# Patient Record
Sex: Male | Born: 1970 | Race: White | Hispanic: No | Smoking: Current every day smoker
Health system: Southern US, Community
[De-identification: ages and names within clinical notes are randomized; demographics above are authoritative.]

## PROBLEM LIST (undated history)

## (undated) DIAGNOSIS — F1910 Other psychoactive substance abuse, uncomplicated: Secondary | ICD-10-CM

## (undated) DIAGNOSIS — F419 Anxiety disorder, unspecified: Secondary | ICD-10-CM

## (undated) DIAGNOSIS — Z8673 Personal history of transient ischemic attack (TIA), and cerebral infarction without residual deficits: Secondary | ICD-10-CM

## (undated) DIAGNOSIS — I639 Cerebral infarction, unspecified: Secondary | ICD-10-CM

## (undated) DIAGNOSIS — F329 Major depressive disorder, single episode, unspecified: Secondary | ICD-10-CM

## (undated) DIAGNOSIS — K219 Gastro-esophageal reflux disease without esophagitis: Secondary | ICD-10-CM

## (undated) DIAGNOSIS — M25569 Pain in unspecified knee: Secondary | ICD-10-CM

## (undated) DIAGNOSIS — F908 Attention-deficit hyperactivity disorder, other type: Secondary | ICD-10-CM

## (undated) DIAGNOSIS — F319 Bipolar disorder, unspecified: Secondary | ICD-10-CM

## (undated) DIAGNOSIS — F32A Depression, unspecified: Secondary | ICD-10-CM

## (undated) HISTORY — PX: AORTA SURGERY: SHX548

## (undated) HISTORY — DX: Gastro-esophageal reflux disease without esophagitis: K21.9

## (undated) HISTORY — DX: Pain in unspecified knee: M25.569

## (undated) HISTORY — PX: TONSILLECTOMY: SUR1361

## (undated) HISTORY — DX: Cerebral infarction, unspecified: I63.9

---
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
## 2006-12-31 ENCOUNTER — Emergency Department (HOSPITAL_COMMUNITY): Admission: EM | Admit: 2006-12-31 | Discharge: 2006-12-31 | Payer: Self-pay | Admitting: Emergency Medicine

## 2009-03-11 ENCOUNTER — Encounter: Admission: RE | Admit: 2009-03-11 | Discharge: 2009-03-11 | Payer: Self-pay | Admitting: Family Medicine

## 2009-06-30 ENCOUNTER — Emergency Department (HOSPITAL_BASED_OUTPATIENT_CLINIC_OR_DEPARTMENT_OTHER): Admission: EM | Admit: 2009-06-30 | Discharge: 2009-06-30 | Payer: Self-pay | Admitting: Emergency Medicine

## 2009-07-02 ENCOUNTER — Emergency Department (HOSPITAL_BASED_OUTPATIENT_CLINIC_OR_DEPARTMENT_OTHER): Admission: EM | Admit: 2009-07-02 | Discharge: 2009-07-02 | Payer: Self-pay | Admitting: Emergency Medicine

## 2010-08-04 ENCOUNTER — Inpatient Hospital Stay (HOSPITAL_COMMUNITY): Admission: EM | Admit: 2010-08-04 | Discharge: 2010-08-12 | Payer: Self-pay | Admitting: Psychiatry

## 2010-08-04 ENCOUNTER — Emergency Department (HOSPITAL_COMMUNITY): Admission: EM | Admit: 2010-08-04 | Discharge: 2010-08-04 | Payer: Self-pay | Admitting: Emergency Medicine

## 2010-08-04 ENCOUNTER — Ambulatory Visit: Payer: Self-pay | Admitting: Psychiatry

## 2010-10-12 ENCOUNTER — Encounter: Payer: Self-pay | Admitting: Family Medicine

## 2010-12-01 LAB — URINALYSIS, ROUTINE W REFLEX MICROSCOPIC
Bilirubin Urine: NEGATIVE
Glucose, UA: NEGATIVE mg/dL
Ketones, ur: NEGATIVE mg/dL
Specific Gravity, Urine: <1.005 — ABNORMAL LOW (ref 1.005–1.030)
pH: 6 (ref 5.0–8.0)

## 2010-12-01 LAB — DIFFERENTIAL
Basophils Relative: 1 % (ref 0–1)
Lymphs Abs: 1.5 10*3/uL (ref 0.7–4.0)
Monocytes Absolute: 0.6 10*3/uL (ref 0.1–1.0)
Monocytes Relative: 7 % (ref 3–12)
Neutro Abs: 6.3 10*3/uL (ref 1.7–7.7)
Neutrophils Relative %: 72 % (ref 43–77)

## 2010-12-01 LAB — BASIC METABOLIC PANEL
BUN: 6 mg/dL (ref 6–23)
Chloride: 106 mEq/L (ref 96–112)
Glucose, Bld: 100 mg/dL — ABNORMAL HIGH (ref 70–99)
Potassium: 3.9 mEq/L (ref 3.5–5.1)
Sodium: 139 mEq/L (ref 135–145)

## 2010-12-01 LAB — HEPATIC FUNCTION PANEL
ALT: 27 U/L (ref 0–53)
Albumin: 3.6 g/dL (ref 3.5–5.2)
Alkaline Phosphatase: 89 U/L (ref 39–117)
Total Bilirubin: 0.4 mg/dL (ref 0.3–1.2)
Total Protein: 6.3 g/dL (ref 6.0–8.3)

## 2010-12-01 LAB — CBC
HCT: 44.7 % (ref 39.0–52.0)
Hemoglobin: 15 g/dL (ref 13.0–17.0)
MCH: 33.7 pg (ref 26.0–34.0)
MCHC: 33.5 g/dL (ref 30.0–36.0)
RBC: 4.45 MIL/uL (ref 4.22–5.81)

## 2010-12-01 LAB — RAPID URINE DRUG SCREEN, HOSP PERFORMED
Benzodiazepines: NOT DETECTED
Cocaine: NOT DETECTED
Opiates: NOT DETECTED

## 2010-12-01 LAB — LITHIUM LEVEL: Lithium Lvl: 0.41 mEq/L — ABNORMAL LOW (ref 0.80–1.40)

## 2012-03-13 ENCOUNTER — Ambulatory Visit (INDEPENDENT_AMBULATORY_CARE_PROVIDER_SITE_OTHER): Payer: BC Managed Care – PPO | Admitting: Psychology

## 2012-03-13 DIAGNOSIS — F908 Attention-deficit hyperactivity disorder, other type: Secondary | ICD-10-CM

## 2012-03-13 DIAGNOSIS — F909 Attention-deficit hyperactivity disorder, unspecified type: Secondary | ICD-10-CM

## 2012-04-10 ENCOUNTER — Encounter (HOSPITAL_COMMUNITY): Payer: Self-pay | Admitting: Psychology

## 2012-04-10 ENCOUNTER — Ambulatory Visit (INDEPENDENT_AMBULATORY_CARE_PROVIDER_SITE_OTHER): Payer: BC Managed Care – PPO | Admitting: Psychology

## 2012-04-10 DIAGNOSIS — F1911 Other psychoactive substance abuse, in remission: Secondary | ICD-10-CM

## 2012-04-10 DIAGNOSIS — F908 Attention-deficit hyperactivity disorder, other type: Secondary | ICD-10-CM

## 2012-04-10 DIAGNOSIS — F909 Attention-deficit hyperactivity disorder, unspecified type: Secondary | ICD-10-CM

## 2012-04-10 NOTE — Progress Notes (Signed)
Patient:   Daniel Arroyo   DOB:   1971-03-01  MR Number:  [id number]  Location:  [facility] [address] [place] [address] Dept: [phone]           Date of Service:   03/09/2012  Start Time:   3 PM End Time:   4 PM  Provider/Observer:  Hershal Coria PSYD       Billing Code/Service: [phone]  Chief Complaint:     Chief Complaint  Patient presents with  . ADHD  . Depression    Reason for Service:  The patient was referred by Dr. Sherryll Burger because of concerns about attention deficit disorder adult residual size. The patient reports he is continued to struggle with symptoms of attentional problems since college. Back in college he was started on Adderall and he felt that he "did wonderful" the patient reports that over the years he has continued to do more poorly and that he is having more problems due to what his job requirements are. The patient reports that when he has more going on at work he is more issues with his attentional problems. The patient does have a history of depression which sounds more like debridement that developed after his mother died when he was a child and in his mother brother was murdered another brother died of HIV. The patient has been sober for 22 months from significant alcohol use. The patient reports he has been more aware of his attentional problems after he quit drinking that plantars problems. The patient is now started taking Concerta again but continues to feel irritable and has been having trouble sleeping. He is also continuing to take Depakote for seizures.  Current Status:  The patient reports the patient reports that he is continuing to have difficulty with attention and concentration issues and his work requirements have gone up he has had more problems. The patient is become more irritable and had trouble sleeping since starting psychostimulant. He did  report that he did better on psychostimulants back college.   Reliability of Information: The information was provided by the patient as well as his medical records from Dr. Sherryll Burger.  Behavioral Observation: Daniel Arroyo  presents as a 41 y.o.-year-old Right Caucasian Male who appeared his stated age. his dress was Appropriate and he was Well Groomed and his manners were Appropriate to the situation.  There were not any physical disabilities noted.  he displayed an appropriate level of cooperation and motivation.    Interactions:    Active   Attention:   within normal limits  Memory:   within normal limits  Visuo-spatial:   within normal limits  Speech (Volume):  normal  Speech:   normal pitch and normal volume  Thought Process:  Coherent  Though Content:  WNL  Orientation:   person, place, time/date and situation  Judgment:   Good  Planning:   Good  Affect:    Appropriate  Mood:    Depressed  Insight:   Good  Intelligence:   high  Marital Status/Living: The patient is divorced and now single. He has a 75-year-old son and a 43 year old son both live with their mother. The patient had a twin brother that was murdered at age 62 and an older brother who died of HIV. The patient is the oldest child still alive.  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  Substance Use:  There is a documented history of alcohol abuse confirmed by the patient.  patient reports that he quit drinking 22 months ago.  Education:   Automotive engineer  the patient received his bachelor's degree from [employer] and received his Master's degree in accounting as well. He reports that he always had difficulty in school because of attentional problems.  Medical History:   Past Medical History  Diagnosis Date  . Knee pain   . Gastroesophageal reflux disease         Outpatient Encounter Prescriptions as of  03/13/2012  Medication Sig Dispense Refill  . divalproex (DEPAKOTE ER) 250 MG 24 hr tablet Take 250 mg by mouth daily.      . methylphenidate (CONCERTA) 18 MG CR tablet Take 18 mg by mouth every morning.              Sexual History:   History  Sexual Activity  . Sexually Active: Yes    Abuse/Trauma History: The patient denies a history of abuse/trauma  Psychiatric History:  The patient was treated for essential college and was treated Adderall for 4 years.  Family Med/Psych History: History reviewed. No pertinent family history.  Risk of Suicide/Violence: virtually non-existent   Impression/DX:  At this point, the patient does report a long-standing history of attentional problems with most of them being described is happening when he was in college and again as work demands have increased. However, he also has a history of depression with at least have to do with bereavement. The patient is being treated for seizure disorder which status post some concern about the use of psychostimulant medications and I will going to do with him more fully.  Disposition/Plan:  We will do formal neuropsychological testing to assess multiple aspects of attention/concentration to facilitate differential diagnoses to help with treatment planning including medication another option.  Diagnosis:    Axis I:   1. ADHD, adult residual type         Axis II: No diagnosis       Axis IV:  occupational problems          Axis V:  51-60 moderate symptoms

## 2012-04-20 ENCOUNTER — Ambulatory Visit (HOSPITAL_COMMUNITY): Payer: Self-pay | Admitting: Psychology

## 2012-05-02 ENCOUNTER — Ambulatory Visit (HOSPITAL_COMMUNITY): Payer: Self-pay | Admitting: Psychology

## 2012-05-29 ENCOUNTER — Ambulatory Visit (INDEPENDENT_AMBULATORY_CARE_PROVIDER_SITE_OTHER): Payer: BC Managed Care – PPO | Admitting: Psychology

## 2012-05-29 DIAGNOSIS — F909 Attention-deficit hyperactivity disorder, unspecified type: Secondary | ICD-10-CM

## 2012-05-29 DIAGNOSIS — F1911 Other psychoactive substance abuse, in remission: Secondary | ICD-10-CM

## 2012-05-29 DIAGNOSIS — F908 Attention-deficit hyperactivity disorder, other type: Secondary | ICD-10-CM

## 2012-05-30 ENCOUNTER — Telehealth (HOSPITAL_COMMUNITY): Payer: Self-pay | Admitting: *Deleted

## 2012-05-30 NOTE — Progress Notes (Signed)
Today I provided feedback regarding the results of the recent psychological testing that can be found in his July 22 noted. The patient had to reschedule the previous feedback session. The patient's psychological/neuropsychological testing are consistent with a dull residual attention deficit disorder. The patient has been tried on Concerta recently but did not have a really good response. He is continuing to take Depakote following a hospitalization for polysubstance abuse. The patient has been clean of any substance abuse for quite some time. The patient did have a positive response to Adderall in the past. I have been working on calling his physician regarding this problem. I will discuss with him the possibility of a trial of Adderall in the other option of Wellbutrin in lieu of the Depakote.

## 2012-05-30 NOTE — Progress Notes (Signed)
The patient was administered the Comprehensive Attention Battery and the CAB CPT measures. The patient appeared to fully participate in these testing procedures and this does appear to be a fair and valid sample of his current attentional abilities as well as various aspects of executive functioning. Below are the results of this broad and comprehensive assessment of attention/concentration and executive functioning.  Initially, the patient was administered the auditory/visual reaction time test. These two measures are both pure reaction time measures and are administered in both the visual and auditory modalities. On the visual pure reaction time test, the patient accurately responded to 50 of the 50 targets, which is within normal limits. his average response time was 490 ms which is also within normal limits. The patient was administered the auditory pure reaction time test and he correctly responded to 50 of 50 targets, which is an efficient performance and within normal limits. his average response time was 632 ms, which mildly impaired and just outside of normal limits.  The patient was then administered the discriminant reaction time test. he was administered the visual, auditory, and mixed subtests. On the visual discriminate reaction time measure, he correctly responded to 34 of 35 targets and had 0 errors of commission and 1 errors of omission. This is an efficient performance and represents a performance that is within normative expectations. his average response time for correctly responded to items was 818 ms which is mildly impaired relative to response times. The patient was then administered the auditory discriminate reaction time measure. he correctly responded to 35 of 35 targets, which is efficient and within normal limits. his average response time was 932 ms, which is mildly impaired and outside of normative expectations. The patient was then administered the mixed discriminate reaction  time, which require shifting from between either auditory or visual targets with an alteration between auditory and visual stimuli. This measure require shifting attention on top of discriminate identification and responding.  The patient correctly responded to 24 of the 30 targets and had 4 errors of commission and 6 errors of omission. This is an impaired score for accuracy.  his average response time for correct responses was 1091 ms.  This performance is also outside of  normal limits and represents mild impairments with regard to processing speed a response time.  The patient was administered the auditory/visual scan reaction time test. On the visual measure the patient correctly responded to 40 of 40 targets and the average response time was 903 ms and mildly impaired and outside of normal limits. The auditory measure resulted in the correct response to 40 of 40 targets with 0 errors of commission and  0 error of omission. his average response times again more impaired and outside of normal limits. The patient was then administered the mixed auditory visual scan measure and he correctly responded to 40 of 40 targets, which is within normal limits and his response times were again outside of normal limits.  The patient was then administered the auditory/visual encoding test. On the auditory forwards the patient's performance was within normal limits.  On the auditory backwards measures the patient's performance was within normal limits.  This pattern suggests adequate with regard to auditory encoding. On the visual encoding forward measure the patient produced performance that was within normal limits.  On the visual backwards measures the patient's performance was within normal limits.  Overall, this pattern suggests that auditory encoding is within normal limits and visual encoding is also within normal limits.  The patient was then administered the Stroop interference cancellation test. This task is  broken down into eight separate trials. On the first four trials the patient is presented with a focus execute task that requires the patient to scan a 36 grid layout in which the words red green or blue were randomly printed in each grid. Each of these color words and be printed in either red green or blue color. On half of them, the word matches the color of the font and it is these that the patient is to identify where the color and word match. After the first four trials of this visual scanning measure change to four trials that include a Stroop interference component inwhich the words red green and blue are played randomly over the speakers. On the first four "noninterference" trials the patient produced performances on these focus execute task that were mildly impaired and just outside of normal limits. he correctly identified between 7 and 10 items on each of these trials. On the next four interference trials, the patient's performance showed moderate improvement although he did show deterioration in performance towards the end. The patient showed no significant interference and but did have difficulty handling the Stroop challenges in general suggesting some problems with focus execute speed in mental processing speed.  The patient was then administered the CAB CPT visual monitor measure, which is a 15 minute long visual continuous performance measure.  This measure is broken down into five 3-minute blocks of time for analysis. The patient is presented with either the color red green or blue every 2 seconds and every time the color red is presented the patient is to respond. On the first 3 min. Block of time the patient correctly identified 28 of 30 targets with 0 error of commission and 2 errors of omission. his average response time was 612 ms. This performance progressive deterioration over the next four blocks of time.  Average response time 761 ms consistent and by the last 3 min. of this measure  average response time was 761  ms, which is a significant increase over the very first 3 min. of this task. The results of this continues performance measure are clearly consistent with problems with sustained attention and concentration.  Overall:  The patient's performance on this broad range of attention/concentration measures and executive functioning measures are clearly consistent with those typically found with a dull residual attention deficit disorder. The patient in particular, showed generally slowed information processing speed and focus execute task as well as problems with distractibility, sustained attention, and the ability to inhibit impulsive responding. The slowed information processing speed were likely attempts to adjust for and compensate for his increased impulsivity and difficulty inhibiting responses in order to not make a mistake. Overall, this pattern is consistent with attention deficit disorder adult residual type. While the patient did not show particularly good response to Concerta he does report that he had a good response to Adderall when he was much younger. This allowed him to effectively get through college. The patient reports he does have a history of substance abuse but most of this had to do with his impulsivity and he has been completely clean of any substance abuse or alcohol abuse for many years. He is maintaining active work.  As far as recommendations I think that it is worthwhile to try him on Adderall. He has been taking Depakote for some time because he was hospitalized during his polysubstance abuse in the inpatient and then intensive  outpatient programs. Depending on how this turns out it may also be worthwhile trying Wellbutrin and dropping the Depakote and seeing how he does on those.  I will contact his primary care physician regarding these options and we will coordinate care.

## 2012-06-28 ENCOUNTER — Ambulatory Visit (INDEPENDENT_AMBULATORY_CARE_PROVIDER_SITE_OTHER): Payer: BC Managed Care – PPO | Admitting: Psychology

## 2012-06-28 DIAGNOSIS — F908 Attention-deficit hyperactivity disorder, other type: Secondary | ICD-10-CM

## 2012-06-28 DIAGNOSIS — F909 Attention-deficit hyperactivity disorder, unspecified type: Secondary | ICD-10-CM

## 2012-06-29 ENCOUNTER — Encounter (HOSPITAL_COMMUNITY): Payer: Self-pay | Admitting: Psychology

## 2012-06-29 NOTE — Progress Notes (Signed)
The patient comes in today and reports that he has been responding quite well to the 20 mg of Adderall. However, he reports that it is seeming to wear off in the afternoon and we talked about potentially raising the dose up to 40 mg if this is okay with his physician. I called Dr. Sherryll Burger about this and we discussed the situation with the patient he was comfortable following up and continuing the medication. The patient reports that he is doing better at work and there are no apparent side effects and is not experiencing any increasing cravings or other problems.

## 2012-07-20 ENCOUNTER — Ambulatory Visit (INDEPENDENT_AMBULATORY_CARE_PROVIDER_SITE_OTHER): Payer: BC Managed Care – PPO | Admitting: Psychology

## 2012-07-20 DIAGNOSIS — F909 Attention-deficit hyperactivity disorder, unspecified type: Secondary | ICD-10-CM

## 2012-07-20 DIAGNOSIS — F908 Attention-deficit hyperactivity disorder, other type: Secondary | ICD-10-CM

## 2012-08-02 ENCOUNTER — Encounter (HOSPITAL_COMMUNITY): Payer: Self-pay | Admitting: Psychology

## 2012-08-02 NOTE — Progress Notes (Signed)
The patient comes in today and reports that he has been doing very well on the medicine and they're working on the specific dose and times when he takes it. He reports that he try taking it later in the morning as it tended to wear off in the last hour or so before. The patient reports that he is now trying to take it about 10:00 in the morning and finds that works quite well with his work schedule.

## 2012-08-15 ENCOUNTER — Encounter: Payer: Self-pay | Admitting: Psychology

## 2012-12-22 ENCOUNTER — Ambulatory Visit (HOSPITAL_COMMUNITY): Payer: Self-pay | Admitting: Psychology

## 2013-10-28 ENCOUNTER — Encounter (HOSPITAL_COMMUNITY): Payer: Self-pay | Admitting: Emergency Medicine

## 2013-10-28 ENCOUNTER — Emergency Department (HOSPITAL_COMMUNITY)
Admission: EM | Admit: 2013-10-28 | Discharge: 2013-10-30 | Disposition: A | Discharge status: Home / Self Care | Payer: BC Managed Care – PPO | Attending: Emergency Medicine | Admitting: Emergency Medicine

## 2013-10-28 DIAGNOSIS — F10239 Alcohol dependence with withdrawal, unspecified: Secondary | ICD-10-CM | POA: Diagnosis present

## 2013-10-28 DIAGNOSIS — Z8719 Personal history of other diseases of the digestive system: Secondary | ICD-10-CM | POA: Insufficient documentation

## 2013-10-28 DIAGNOSIS — F1210 Cannabis abuse, uncomplicated: Secondary | ICD-10-CM | POA: Insufficient documentation

## 2013-10-28 DIAGNOSIS — R45851 Suicidal ideations: Secondary | ICD-10-CM

## 2013-10-28 DIAGNOSIS — Z8739 Personal history of other diseases of the musculoskeletal system and connective tissue: Secondary | ICD-10-CM | POA: Insufficient documentation

## 2013-10-28 DIAGNOSIS — F10939 Alcohol use, unspecified with withdrawal, unspecified: Secondary | ICD-10-CM | POA: Diagnosis present

## 2013-10-28 DIAGNOSIS — Z79899 Other long term (current) drug therapy: Secondary | ICD-10-CM | POA: Insufficient documentation

## 2013-10-28 DIAGNOSIS — F1410 Cocaine abuse, uncomplicated: Secondary | ICD-10-CM | POA: Insufficient documentation

## 2013-10-28 DIAGNOSIS — F1110 Opioid abuse, uncomplicated: Secondary | ICD-10-CM | POA: Diagnosis present

## 2013-10-28 DIAGNOSIS — F909 Attention-deficit hyperactivity disorder, unspecified type: Secondary | ICD-10-CM | POA: Insufficient documentation

## 2013-10-28 DIAGNOSIS — F1010 Alcohol abuse, uncomplicated: Secondary | ICD-10-CM

## 2013-10-28 DIAGNOSIS — F17200 Nicotine dependence, unspecified, uncomplicated: Secondary | ICD-10-CM | POA: Insufficient documentation

## 2013-10-28 DIAGNOSIS — F32A Depression, unspecified: Secondary | ICD-10-CM | POA: Diagnosis present

## 2013-10-28 DIAGNOSIS — F329 Major depressive disorder, single episode, unspecified: Secondary | ICD-10-CM | POA: Diagnosis present

## 2013-10-28 HISTORY — DX: Depression, unspecified: F32.A

## 2013-10-28 HISTORY — DX: Attention-deficit hyperactivity disorder, other type: F90.8

## 2013-10-28 HISTORY — DX: Major depressive disorder, single episode, unspecified: F32.9

## 2013-10-28 HISTORY — DX: Anxiety disorder, unspecified: F41.9

## 2013-10-28 HISTORY — DX: Other psychoactive substance abuse, uncomplicated: F19.10

## 2013-10-28 HISTORY — DX: Bipolar disorder, unspecified: F31.9

## 2013-10-28 LAB — RAPID URINE DRUG SCREEN, HOSP PERFORMED
Amphetamines: NOT DETECTED
Barbiturates: NOT DETECTED
Benzodiazepines: NOT DETECTED
COCAINE: NOT DETECTED
OPIATES: NOT DETECTED
Tetrahydrocannabinol: NOT DETECTED

## 2013-10-28 LAB — BASIC METABOLIC PANEL
BUN: 4 mg/dL — AB (ref 6–23)
CHLORIDE: 96 meq/L (ref 96–112)
CO2: 20 meq/L (ref 19–32)
Calcium: 8.4 mg/dL (ref 8.4–10.5)
Creatinine, Ser: 0.83 mg/dL (ref 0.50–1.35)
GFR calc Af Amer: >90 mL/min (ref >90)
GFR calc non Af Amer: >90 mL/min (ref >90)
GLUCOSE: 142 mg/dL — AB (ref 70–99)
POTASSIUM: 3.7 meq/L (ref 3.7–5.3)
Sodium: 133 mEq/L — ABNORMAL LOW (ref 137–147)

## 2013-10-28 LAB — CBC WITH DIFFERENTIAL/PLATELET
BASOS PCT: 2 % — AB (ref 0–1)
Basophils Absolute: 0.1 10*3/uL (ref 0.0–0.1)
EOS ABS: 0.3 10*3/uL (ref 0.0–0.7)
Eosinophils Relative: 4 % (ref 0–5)
HEMATOCRIT: 43.6 % (ref 39.0–52.0)
HEMOGLOBIN: 15.8 g/dL (ref 13.0–17.0)
Lymphocytes Relative: 36 % (ref 12–46)
Lymphs Abs: 2.7 10*3/uL (ref 0.7–4.0)
MCH: 34.1 pg — AB (ref 26.0–34.0)
MCHC: 36.2 g/dL — AB (ref 30.0–36.0)
MCV: 94.2 fL (ref 78.0–100.0)
MONO ABS: 0.5 10*3/uL (ref 0.1–1.0)
MONOS PCT: 7 % (ref 3–12)
NEUTROS ABS: 3.9 10*3/uL (ref 1.7–7.7)
Neutrophils Relative %: 52 % (ref 43–77)
Platelets: 329 10*3/uL (ref 150–400)
RBC: 4.63 MIL/uL (ref 4.22–5.81)
RDW: 13.3 % (ref 11.5–15.5)
WBC: 7.5 10*3/uL (ref 4.0–10.5)

## 2013-10-28 LAB — ETHANOL: Alcohol, Ethyl (B): 297 mg/dL — ABNORMAL HIGH (ref 0–11)

## 2013-10-28 MED ORDER — ALUM & MAG HYDROXIDE-SIMETH 200-200-20 MG/5ML PO SUSP: 30.0000 mL | ORAL | Status: DC | PRN

## 2013-10-28 MED ORDER — CHLORDIAZEPOXIDE HCL 25 MG PO CAPS
25.0000 mg | ORAL_CAPSULE | Freq: Four times a day (QID) | ORAL | Status: DC
  Administered 2013-10-29 (×3): 25 mg via ORAL
  Filled 2013-10-28 (×3): qty 1

## 2013-10-28 MED ORDER — NICOTINE 21 MG/24HR TD PT24
21.0000 mg | MEDICATED_PATCH | Freq: Every day | TRANSDERMAL | Status: DC
  Administered 2013-10-28 – 2013-10-30 (×3): 21 mg via TRANSDERMAL
  Filled 2013-10-28 (×4): qty 1

## 2013-10-28 MED ORDER — CHLORDIAZEPOXIDE HCL 25 MG PO CAPS: 25.0000 mg | ORAL_CAPSULE | Freq: Every day | ORAL | Status: DC

## 2013-10-28 MED ORDER — ONDANSETRON 4 MG PO TBDP: 4.0000 mg | ORAL_TABLET | Freq: Four times a day (QID) | ORAL | Status: DC | PRN

## 2013-10-28 MED ORDER — VITAMIN B-1 100 MG PO TABS
100.0000 mg | ORAL_TABLET | Freq: Every day | ORAL | Status: DC
  Administered 2013-10-29 – 2013-10-30 (×2): 100 mg via ORAL
  Filled 2013-10-28 (×2): qty 1

## 2013-10-28 MED ORDER — CHLORDIAZEPOXIDE HCL 25 MG PO CAPS: 25.0000 mg | ORAL_CAPSULE | ORAL | Status: DC

## 2013-10-28 MED ORDER — LORAZEPAM 1 MG PO TABS: 1.0000 mg | ORAL_TABLET | Freq: Three times a day (TID) | ORAL | Status: DC | PRN

## 2013-10-28 MED ORDER — LORAZEPAM 1 MG PO TABS
0.0000 mg | ORAL_TABLET | Freq: Four times a day (QID) | ORAL | Status: DC
  Administered 2013-10-28: 2 mg via ORAL
  Filled 2013-10-28: qty 2

## 2013-10-28 MED ORDER — ADULT MULTIVITAMIN W/MINERALS CH
1.0000 | ORAL_TABLET | Freq: Every day | ORAL | Status: DC
  Administered 2013-10-29 – 2013-10-30 (×2): 1 via ORAL
  Filled 2013-10-28 (×2): qty 1

## 2013-10-28 MED ORDER — LORAZEPAM 1 MG PO TABS: 0.0000 mg | ORAL_TABLET | Freq: Two times a day (BID) | ORAL | Status: DC

## 2013-10-28 MED ORDER — CHLORDIAZEPOXIDE HCL 25 MG PO CAPS
25.0000 mg | ORAL_CAPSULE | Freq: Four times a day (QID) | ORAL | Status: DC | PRN
  Filled 2013-10-28: qty 1

## 2013-10-28 MED ORDER — ONDANSETRON HCL 4 MG PO TABS: 4.0000 mg | ORAL_TABLET | Freq: Three times a day (TID) | ORAL | Status: DC | PRN

## 2013-10-28 MED ORDER — LORAZEPAM 1 MG PO TABS
2.0000 mg | ORAL_TABLET | Freq: Once | ORAL | Status: AC
  Administered 2013-10-28: 2 mg via ORAL
  Filled 2013-10-28: qty 2

## 2013-10-28 MED ORDER — THIAMINE HCL 100 MG/ML IJ SOLN: 100.0000 mg | Freq: Every day | INTRAMUSCULAR | Status: DC

## 2013-10-28 MED ORDER — CHLORDIAZEPOXIDE HCL 25 MG PO CAPS
25.0000 mg | ORAL_CAPSULE | Freq: Once | ORAL | Status: AC
  Administered 2013-10-28: 25 mg via ORAL

## 2013-10-28 MED ORDER — LOPERAMIDE HCL 2 MG PO CAPS: 2.0000 mg | ORAL_CAPSULE | ORAL | Status: DC | PRN

## 2013-10-28 MED ORDER — HYDROXYZINE HCL 25 MG PO TABS
25.0000 mg | ORAL_TABLET | Freq: Four times a day (QID) | ORAL | Status: DC | PRN
  Administered 2013-10-28 – 2013-10-29 (×4): 25 mg via ORAL
  Filled 2013-10-28 (×4): qty 1

## 2013-10-28 MED ORDER — CHLORDIAZEPOXIDE HCL 25 MG PO CAPS
25.0000 mg | ORAL_CAPSULE | Freq: Three times a day (TID) | ORAL | Status: DC
  Administered 2013-10-29: 25 mg via ORAL
  Filled 2013-10-28: qty 1

## 2013-10-28 NOTE — ED Notes (Addendum)
Pt hiding in the cabinet after arriving to the unit.  Pt then became angry/yelling/cursing and knocked coke over and then slammed the bedside table against the wall.  When security and police came to the room to investigate he got up, became verbally aggressive, yelling, cursing and challeging the police.  Pt informed that the officers will not leave until they are sure that we (staff) are safe and he is able to calm down.  Julieanne Cottonina AC and Shuvon NP into talk w/ the patient and he was able to calm down and discuss his situation with them.  Pt reported that he has been drinking ETOH, using cocaine, numerous other drugs, has lost his job,and has been stealing things.  Pt reports that he has been thru detox and rehab numerous times and the longest time sober was 4.5 yrs.  Support given, still angry,but is calm.

## 2013-10-28 NOTE — ED Provider Notes (Signed)
CSN: [id number]     Arrival date & time 10/28/13  1357 History   First MD Initiated Contact with Patient 10/28/13 1412     Chief Complaint  Patient presents with  . Medical Clearance    HPI Pt was seen at 1415. Per EMS, pt and his friend, c/o gradual onset and worsening of persistent depression and SI for the past 2 weeks. Pt's friend states pt "relapsed" and "started using drugs again" (etoh, cocaine, heroin). Pt's friend states pt told her today he "wanted to take enough drugs to die." Stated he was about to "get a check" and "will have enough money to do that." [facility] he became agitated and hostile with staff there. EMS was called to transport pt to the ED. Pt continues to endorse SI and polysubstance abuse. Denies HI, no SA.    Past Medical History  Diagnosis Date  . Knee pain   . Gastroesophageal reflux disease   . Polysubstance abuse   . Bipolar disorder   . Anxiety and depression   . ADHD, adult residual type    History reviewed. No pertinent past surgical history.  History  Substance Use Topics  . Smoking status: Current Every Day Smoker -- 1.00 packs/day  . Smokeless tobacco: Never Used  . Alcohol Use: Yes    Review of Systems ROS: Statement: All systems negative except as marked or noted in the HPI; Constitutional: Negative for fever and chills. ; ; Eyes: Negative for eye pain, redness and discharge. ; ; ENMT: Negative for ear pain, hoarseness, nasal congestion, sinus pressure and sore throat. ; ; Cardiovascular: Negative for chest pain, palpitations, diaphoresis, dyspnea and peripheral edema. ; ; Respiratory: Negative for cough, wheezing and stridor. ; ; Gastrointestinal: Negative for nausea, vomiting, diarrhea, abdominal pain, blood in stool, hematemesis, jaundice and rectal bleeding. . ; ; Genitourinary: Negative for dysuria, flank pain and hematuria. ; ; Musculoskeletal: Negative for back pain and neck pain. Negative for swelling and trauma.; ;  Skin: Negative for pruritus, rash, abrasions, blisters, bruising and skin lesion.; ; Neuro: Negative for headache, lightheadedness and neck stiffness. Negative for weakness, altered level of consciousness , altered mental status, extremity weakness, paresthesias, involuntary movement, seizure and syncope.; Psych:  +SI with plan. No SA, no HI, no hallucinations.     Allergies  Review of patient's allergies indicates no known allergies.  Home Medications   Current Outpatient Rx  Name  Route  Sig  Dispense  Refill  . amphetamine-dextroamphetamine (ADDERALL XR) 10 MG 24 hr capsule   Oral   Take 10 mg by mouth every morning.         . divalproex (DEPAKOTE ER) 250 MG 24 hr tablet   Oral   Take 250 mg by mouth daily.          BP 182/96  Pulse 141  Temp(Src) 98.5 F (36.9 C) (Oral)  Resp 18  SpO2 97% Physical Exam 1420: Physical examination:  Nursing notes reviewed; Vital signs and O2 SAT reviewed;  Constitutional: Well developed, Well nourished, Well hydrated, Agitated.; Head:  Normocephalic, atraumatic; Eyes: EOMI, PERRL, No scleral icterus; ENMT: Mouth and pharynx normal, Mucous membranes moist; Neck: Supple, Full range of motion, No lymphadenopathy; Cardiovascular: Regular rate and rhythm, No murmur, rub, or gallop; Respiratory: Breath sounds clear & equal bilaterally, No rales, rhonchi, wheezes.  Speaking full sentences with ease, Normal respiratory effort/excursion; Chest: Nontender, Movement normal; Abdomen: Soft, Nontender, Nondistended, Normal bowel sounds;; Extremities: Pulses normal, No  tenderness, No edema, No calf edema or asymmetry.; Neuro: AA&Ox3, Major CN grossly intact.  Speech clear. No gross focal motor or sensory deficits in extremities. Climbs on and off stretcher easily by himself. Gait steady.; Skin: Color normal, Warm, Dry.; Psych:  Guarded, easily agitated and hostile.    ED Course  Procedures   1430:  Pt agitated and hostile on arrival to ED. Did calm himself  with one RN and myself enough to give HPI and agree to take PO ativan. Appears guarded and continues easily agitated. IVC paperwork completed. Will need TTS eval and admission.  1525:  TTS eval pending. CIWA protocol and holding orders written.    EKG Interpretation   None       MDM  MDM Reviewed: previous chart, nursing note and vitals Reviewed previous: labs Interpretation: labs     Results for orders placed during the hospital encounter of 10/28/13  URINE RAPID DRUG SCREEN (HOSP PERFORMED)      Result Value Range   Opiates NONE DETECTED  NONE DETECTED   Cocaine NONE DETECTED  NONE DETECTED   Benzodiazepines NONE DETECTED  NONE DETECTED   Amphetamines NONE DETECTED  NONE DETECTED   Tetrahydrocannabinol NONE DETECTED  NONE DETECTED   Barbiturates NONE DETECTED  NONE DETECTED  ETHANOL      Result Value Range   Alcohol, Ethyl (B) 297 (*) 0 - 11 mg/dL  BASIC METABOLIC PANEL      Result Value Range   Sodium 133 (*) 137 - 147 mEq/L   Potassium 3.7  3.7 - 5.3 mEq/L   Chloride 96  96 - 112 mEq/L   CO2 20  19 - 32 mEq/L   Glucose, Bld 142 (*) 70 - 99 mg/dL   BUN 4 (*) 6 - 23 mg/dL   Creatinine, Ser 1.61  0.50 - 1.35 mg/dL   Calcium 8.4  8.4 - 09.6 mg/dL   GFR calc non Af Amer >90  >90 mL/min   GFR calc Af Amer >90  >90 mL/min  CBC WITH DIFFERENTIAL      Result Value Range   WBC 7.5  4.0 - 10.5 K/uL   RBC 4.63  4.22 - 5.81 MIL/uL   Hemoglobin 15.8  13.0 - 17.0 g/dL   HCT 04.5  40.9 - 81.1 %   MCV 94.2  78.0 - 100.0 fL   MCH 34.1 (*) 26.0 - 34.0 pg   MCHC 36.2 (*) 30.0 - 36.0 g/dL   RDW 91.4  78.2 - 95.6 %   Platelets 329  150 - 400 K/uL   Neutrophils Relative % 52  43 - 77 %   Neutro Abs 3.9  1.7 - 7.7 K/uL   Lymphocytes Relative 36  12 - 46 %   Lymphs Abs 2.7  0.7 - 4.0 K/uL   Monocytes Relative 7  3 - 12 %   Monocytes Absolute 0.5  0.1 - 1.0 K/uL   Eosinophils Relative 4  0 - 5 %   Eosinophils Absolute 0.3  0.0 - 0.7 K/uL   Basophils Relative 2 (*) 0 - 1 %    Basophils Absolute 0.1  0.0 - 0.1 K/uL         Laray Anger, DO 10/28/13 1541

## 2013-10-28 NOTE — ED Notes (Signed)
Bed: ZO10WA12 Expected date:  Expected time:  Means of arrival:  Comments: Hold

## 2013-10-28 NOTE — ED Notes (Signed)
Calm, talking w/ tina AC

## 2013-10-28 NOTE — ED Notes (Signed)
Security x 4 and [employer] at bedside

## 2013-10-28 NOTE — ED Notes (Signed)
Friend reports patient has been heavily drinking, using crack, heroine, cocaine.  Today told friend he wanted to get enough drugs to die.  Friend reports he is getting a check and will have enough money to accomplish that.  Pt is very hostile, belligerent, yelling.  Pt appears intoxicated and under the influence of drugs.

## 2013-10-28 NOTE — Consult Note (Signed)
  Patient is agitated yelling.  Patient is intoxicated unable to assess at this time other than patient stating that he does every drug there is.  "I do it all and I wants some help if I go home I will have some money in 24-48 hours and I will just end it all."  Patient will need to be reassessed once sober.  Will start Librium protocol for alcohol.  UDS was negative for opiates.  Patient has had 4 mg of Ativan and Librium can be started between 7 or 8 pm tonight.    Shuvon B. Rankin FNP-BC  I agreed with the findings, treatment and disposition plan of this patient. Kathryne SharperSyed Khaleelah Yowell, MD

## 2013-10-28 NOTE — ED Notes (Signed)
Pt  Sitting on the bed angry.  Security walked by the room and the pt began yelling/cursing, spilled his drink and then pushed the bedside table against the wall.  Pt then got louder/more vocal/ up in the room challenging the police officer and security. Pt reasurred that the officers were there to keep us safe.

## 2013-10-28 NOTE — ED Notes (Signed)
Calmer, but still angry, sandwich given, encouraged to rest

## 2013-10-28 NOTE — ED Notes (Signed)
Up to the bathroom 

## 2013-10-28 NOTE — BH Assessment (Signed)
Clinician contacted [facility] to initiate tele-assessment. Nurse reported pt is asleep at this time.   Yaakov Guthrieelilah Stewart, MSW, LCSW Triage Specialist [phone]

## 2013-10-29 DIAGNOSIS — F329 Major depressive disorder, single episode, unspecified: Secondary | ICD-10-CM

## 2013-10-29 DIAGNOSIS — F3289 Other specified depressive episodes: Secondary | ICD-10-CM

## 2013-10-29 DIAGNOSIS — F10239 Alcohol dependence with withdrawal, unspecified: Secondary | ICD-10-CM

## 2013-10-29 DIAGNOSIS — F10939 Alcohol use, unspecified with withdrawal, unspecified: Secondary | ICD-10-CM

## 2013-10-29 NOTE — [facility] Counselor (Signed)
Per pt's request, writer answered pt's questions re: IVC process. Pt is polite and expressed remorse [facility] "[facility]. Pt sts that he attended NA and AA [place]. Pt sts that he had almost 4 years clean and sober prior to his relapse in Oct.   Evette Cristalaroline Paige Julienne Vogler, [place] Assessment Counselor

## 2013-10-29 NOTE — Progress Notes (Signed)
   CARE MANAGEMENT ED NOTE 10/29/2013  Patient:  Daniel Arroyo,Daniel Arroyo   Account Number:  [id number]  Date Initiated:  10/29/2013  Documentation initiated by:  Radford PaxFERRERO,Annalia Metzger  Subjective/Objective Assessment:   Patient presents to Ed inoxicated.     Subjective/Objective Assessment Detail:   Patient with pmhx of GERD, polysubstance abuse, ADHD bipolar disorder anxiety and depression.     Action/Plan:   Action/Plan Detail:   Anticipated DC Date:       Status Recommendation to Physician:   Result of Recommendation:    Other ED Services  Consult Working Plan    DC Planning Services  Other  PCP issues    Choice offered to / List presented to:            Status of service:  Completed, signed off  ED Comments:   ED Comments Detail:  EDCM spoke to patient at bedside.  As per patient, "I haven't seen a doctor in a long time."  [facility] to call th ephone number on the back of his insurance card or go to insurance company website to help him find a pcp whois close to him and within network. Patient verbalized understanding.  No further EDCM needs at this time.

## 2013-10-29 NOTE — Consult Note (Signed)
   Patient sent to ED for being agitated. His alcohol level was high. He has been started on librium detox protocol. Feeling calmer and cooperative.  No psychotic symptoms. Denies suicidal toughts.  Diagnosis: Alcohol use disorder, severe with intoxication.  Plan:  Continue Detox protocol. Start Depakote 500mg  qhs. Admit to Inpatient for mood stability and detox.

## 2013-10-30 ENCOUNTER — Encounter (HOSPITAL_COMMUNITY): Payer: Self-pay | Admitting: Registered Nurse

## 2013-10-30 DIAGNOSIS — F1110 Opioid abuse, uncomplicated: Secondary | ICD-10-CM

## 2013-10-30 DIAGNOSIS — R45851 Suicidal ideations: Secondary | ICD-10-CM

## 2013-10-30 DIAGNOSIS — F1010 Alcohol abuse, uncomplicated: Secondary | ICD-10-CM

## 2013-10-30 MED ORDER — ALUM & MAG HYDROXIDE-SIMETH 200-200-20 MG/5ML PO SUSP: 30.0000 mL | ORAL | Status: DC | PRN

## 2013-10-30 NOTE — Consult Note (Signed)
Face to face evaluation and I agree with the note 

## 2013-10-30 NOTE — ED Notes (Signed)
Pt's sponsor will transport pt. Home after lunch.

## 2013-10-30 NOTE — Discharge Instructions (Signed)
Alcohol and Nutrition °Nutrition serves two purposes. It provides energy. It also maintains body structure and function. Food supplies energy. It also provides the building blocks needed to replace worn or damaged cells. Alcoholics often eat poorly. This limits their supply of essential nutrients. This affects energy supply and structure maintenance. Alcohol also affects the body's nutrients in: °· Digestion. °· Storage. °· Using and getting rid of waste products. °IMPAIRMENT OF NUTRIENT DIGESTION AND UTILIZATION  °· Once ingested, food must be broken down into small components (digested). Then it is available for energy. It helps maintain body structure and function. Digestion begins in the mouth. It continues in the stomach and intestines, with help from the pancreas. The nutrients from digested food are absorbed from the intestines into the blood. Then they are carried to the liver. The liver prepares nutrients for: °· Immediate use. °· Storage and future use. °· Alcohol inhibits the breakdown of nutrients into usable molecules. °· It decreases secretion of digestive enzymes from the pancreas. °· Alcohol impairs nutrient absorption by damaging the cells lining the stomach and intestines. °· It also interferes with moving some nutrients into the blood. °· In addition, nutritional deficiencies themselves may lead to further absorption problems. °· For example, folate deficiency changes the cells that line the small intestine. This impairs how water is absorbed. It also affects absorbed nutrients. These include glucose, sodium, and additional folate. °· Even if nutrients are digested and absorbed, alcohol can prevent them from being fully used. It changes their transport, storage, and excretion. Impaired utilization of nutrients by alcoholics is indicated by: °· Decreased liver stores of vitamins, such as vitamin A. °· Increased excretion of nutrients such as fat. °ALCOHOL AND ENERGY SUPPLY  °· Three basic  nutritional components found in food are: °· Carbohydrates. °· Proteins. °· Fats. °· These are used as energy. Some alcoholics take in as much as 50% of their total daily calories from alcohol. They often neglect important foods. °· Even when enough food is eaten, alcohol can impair the ways the body controls blood sugar (glucose) levels. It may either increase or decrease blood sugar. °· In non-diabetic alcoholics, increased blood sugar (hyperglycemia) is caused by poor insulin secretion. It is usually temporary. °· Decreased blood sugar (hypoglycemia) can cause serious injury even if this condition is short-lived. Low blood sugar can happen when a fasting or malnourished person drinks alcohol. When there is no food to supply energy, stored sugar is used up. The products of alcohol inhibit forming glucose from other compounds such as amino acids. As a result, alcohol causes the brain and other body tissue to lack glucose. It is needed for energy and function. °· Alcohol is an energy source. But how the body processes and uses the energy from alcohol is complex. Also, when alcohol is substituted for carbohydrates, subjects tend to lose weight. This indicates that they get less energy from alcohol than from food. °ALCOHOL - MAINTAINING CELL STRUCTURE AND FUNCTION  °Structure °Cells are made mostly of protein. So an adequate protein diet is important for maintaining cell structure. This is especially true if cells are being damaged. Research indicates that alcohol affects protein nutrition by causing impaired: °· Digestion of proteins to amino acids. °· Processing of amino acids by the small intestine and liver. °· Synthesis of proteins from amino acids. °· Protein secretion by the liver. °Function °Nutrients are essential for the body to function well. They provide the tools that the body needs to work well:  °·   Proteins.  Vitamins.  Minerals. Alcohol can disrupt body function. It may cause nutrient  deficiencies. And it may interfere with the way nutrients are processed. Vitamins  Vitamins are essential to maintain growth and normal metabolism. They regulate many of the body`s processes. Chronic heavy drinking causes deficiencies in many vitamins. This is caused by eating less. And, in some cases, vitamins may be poorly absorbed. For example, alcohol inhibits fat absorption. It impairs how the vitamins A, E, and D are normally absorbed along with dietary fats. Not enough vitamin A may cause night blindness. Not enough vitamin D may cause softening of the bones.  Some alcoholics lack vitamins A, C, D, E, K, and the B vitamins. These are all involved in wound healing and cell maintenance. In particular, because vitamin K is necessary for blood clotting, lacking that vitamin can cause delayed clotting. The result is excess bleeding. Lacking other vitamins involved in brain function may cause severe neurological damage. Minerals Deficiencies of minerals such as calcium, magnesium, iron, and zinc are common in alcoholics. The alcohol itself does not seem to affect how these minerals are absorbed. Rather, they seem to occur secondary to other alcohol-related problems, such as:  Less calcium absorbed.  Not enough magnesium.  More urinary excretion.  Vomiting.  Diarrhea.  Not enough iron due to gastrointestinal bleeding.  Not enough zinc or losses related to other nutrient deficiencies.  Mineral deficiencies can cause a variety of medical consequences. These range from calcium-related bone disease to zinc-related night blindness and skin lesions. ALCOHOL, MALNUTRITION, AND MEDICAL COMPLICATIONS  Liver Disease   Alcoholic liver damage is caused primarily by alcohol itself. But poor nutrition may increase the risk of alcohol-related liver damage. For example, nutrients normally found in the liver are known to be affected by drinking alcohol. These include carotenoids, which are the major  sources of vitamin A, and vitamin E compounds. Decreases in such nutrients may play some role in alcohol-related liver damage. Pancreatitis  Research suggests that malnutrition may increase the risk of developing alcoholic pancreatitis. Research suggests that a diet lacking in protein may increase alcohol's damaging effect on the pancreas. Brain  Nutritional deficiencies may have severe effects on brain function. These may be permanent. Specifically, thiamine deficiencies are often seen in alcoholics. They can cause severe neurological problems. These include:  Impaired movement.  Memory loss seen in Wernicke-Korsakoff syndrome. Pregnancy  Alcohol has toxic effects on fetal development. It causes alcohol-related birth defects. They include fetal alcohol syndrome. Alcohol itself is toxic to the fetus. Also, the nutritional deficiency can affect how the fetus develops. That may compound the risk of developmental damage.  Nutritional needs during pregnancy are 10% to 30% greater than normal. Food intake can increase by as much as 140% to cover the needs of both mother and fetus. An alcoholic mother`s nutritional problems may adversely affect the nutrition of the fetus. And alcohol itself can also restrict nutrition flow to the fetus. NUTRITIONAL STATUS OF ALCOHOLICS  Techniques for assessing nutritional status include:  Taking body measurements to estimate fat reserves. They include:  Weight.  Height.  Mass.  Skin fold thickness.  Performing blood analysis to provide measurements of circulating:  Proteins.  Vitamins.  Minerals.  These techniques tend to be imprecise. For many nutrients, there is no clear "cut-off" point that would allow an accurate definition of deficiency. So assessing the nutritional status of alcoholics is limited by these techniques. Dietary status may provide information about the risk of developing nutritional problems.  Dietary status is assessed by:  Taking  patients' dietary histories.  Evaluating the amount and types of food they are eating.  It is difficult to determine what exact amount of alcohol begins to have damaging effects on nutrition. In general, moderate drinkers have 2 drinks or less per day. They seem to be at little risk for nutritional problems. Various medical disorders begin to appear at greater levels.  Research indicates that the majority of even the heaviest drinkers have few obvious nutritional deficiencies. Many alcoholics who are hospitalized for medical complications of their disease do have severe malnutrition. Alcoholics tend to eat poorly. Often they eat less than the amounts of food necessary to provide enough:  Carbohydrates.  Protein.  Fat.  Vitamins A and C.  B vitamins.  Minerals like calcium and iron. Of major concern is alcohol's effect on digesting food and use of nutrients. It may shift a mildly malnourished person toward severe malnutrition. Document Released: 07/01/2005 Document Revised: 11/29/2011 Document Reviewed: 12/15/2005 [facility] Patient Information 2014 [place].  Alcohol Use Disorder Alcohol use disorder is a mental disorder. It is not a one-time incident of heavy drinking. Alcohol use disorder is the excessive and uncontrollable use of alcohol over time that leads to problems with functioning in one or more areas of daily living. People with this disorder risk harming themselves and others when they drink to excess. Alcohol use disorder also can cause other mental disorders, such as mood and anxiety disorders, and serious physical problems. People with alcohol use disorder often misuse other drugs.  Alcohol use disorder is common and widespread. Some people with this disorder drink alcohol to cope with or escape from negative life events. Others drink to relieve chronic pain or symptoms of mental illness. People with a family history of alcohol use disorder are at higher risk of losing  control and using alcohol to excess.  SYMPTOMS  Signs and symptoms of alcohol use disorder may include the following:   Consumption ofalcohol inlarger amounts or over a longer period of time than intended.  Multiple unsuccessful attempts to cutdown or control alcohol use.   A great deal of time spent obtaining alcohol, using alcohol, or recovering from the effects of alcohol (hangover).  A strong desire or urge to use alcohol (cravings).   Continued use of alcohol despite problems at work, school, or home because of alcohol use.   Continued use of alcohol despite problems in relationships because of alcohol use.  Continued use of alcohol in situations when it is physically hazardous, such as driving a car.  Continued use of alcohol despite awareness of a physical or psychological problem that is likely related to alcohol use. Physical problems related to alcohol use can involve the brain, heart, liver, stomach, and intestines. Psychological problems related to alcohol use include intoxication, depression, anxiety, psychosis, delirium, and dementia.   The need for increased amounts of alcohol to achieve the same desired effect, or a decreased effect from the consumption of the same amount of alcohol (tolerance).  Withdrawal symptoms upon reducing or stopping alcohol use, or alcohol use to reduce or avoid withdrawal symptoms. Withdrawal symptoms include:  Racing heart.  Hand tremor.  Difficulty sleeping.  Nausea.  Vomiting.  Hallucinations.  Restlessness.  Seizures. DIAGNOSIS Alcohol use disorder is diagnosed through an assessment by your caregiver. Your caregiver may start by asking three or four questions to screen for excessive or problematic alcohol use. To confirm a diagnosis of alcohol use disorder, at least two symptoms (  see SYMPTOMS) must be present within a 32-month period. The severity of alcohol use disorder depends on the number of symptoms:  Mild two or  three.  Moderate four or five.  Severe six or more. Your caregiver may perform a physical exam or use results from lab tests to see if you have physical problems resulting from alcohol use. Your caregiver may refer you to a mental health professional for evaluation. TREATMENT  Some people with alcohol use disorder are able to reduce their alcohol use to low-risk levels. Some people with alcohol use disorder need to quit drinking alcohol. When necessary, mental health professionals with specialized training in substance use treatment can help. Your caregiver can help you decide how severe your alcohol use disorder is and what type of treatment you need. The following forms of treatment are available:   Detoxification. Detoxification involves the use of prescription medication to prevent alcohol withdrawal symptoms in the first week after quitting. This is important for people with a history of symptoms of withdrawal and for heavy drinkers who are likely to have withdrawal symptoms. Alcohol withdrawal can be dangerous and, in severe cases, cause death. Detoxification is usually provided in a hospital or in-patient substance use treatment facility.  Counseling or talk therapy. Talk therapy is provided by substance use treatment counselors. It addresses the reasons people use alcohol and ways to keep them from drinking again. The goals of talk therapy are to help people with alcohol use disorder find healthy activities and ways to cope with life stress, to identify and avoid triggers for alcohol use, and to handle cravings, which can cause relapse.  Medication.Different medications can help treat alcohol use disorder through the following actions:  Decrease alcohol cravings.  Decrease the positive reward response felt from alcohol use.  Produce an uncomfortable physical reaction when alcohol is used (aversion therapy).  Support groups. Support groups are run by people who have quit drinking. They  provide emotional support, advice, and guidance. These forms of treatment are often combined. Some people with alcohol use disorder benefit from intensive combination treatment provided by specialized substance use treatment centers. Both inpatient and outpatient treatment programs are available. Document Released: 10/14/2004 Document Revised: 05/09/2013 Document Reviewed: 12/14/2012 [facility] Patient Information 2014 [place].  Alcohol Problems Most adults who drink alcohol drink in moderation (not a lot) are at low risk for developing problems related to their drinking. However, all drinkers, including low-risk drinkers, should know about the health risks connected with drinking alcohol. RECOMMENDATIONS FOR LOW-RISK DRINKING  Drink in moderation. Moderate drinking is defined as follows:   Men - no more than 2 drinks per day.  Nonpregnant women - no more than 1 drink per day.  Over age 58 - no more than 1 drink per day. A standard drink is 12 grams of pure alcohol, which is equal to a 12 ounce bottle of beer or wine cooler, a 5 ounce glass of wine, or 1.5 ounces of distilled spirits (such as whiskey, brandy, vodka, or rum).  ABSTAIN FROM (DO NOT DRINK) ALCOHOL:  When pregnant or considering pregnancy.  When taking a medication that interacts with alcohol.  If you are alcohol dependent.  A medical condition that prohibits drinking alcohol (such as ulcer, liver disease, or heart disease). DISCUSS WITH YOUR CAREGIVER:  If you are at risk for coronary heart disease, discuss the potential benefits and risks of alcohol use: Light to moderate drinking is associated with lower rates of coronary heart disease in certain populations (  for example, men over age 55 and postmenopausal women). Infrequent or nondrinkers are advised not to begin light to moderate drinking to reduce the risk of coronary heart disease so as to avoid creating an alcohol-related problem. Similar protective effects can  likely be gained through proper diet and exercise.  Women and the elderly have smaller amounts of body water than men. As a result women and the elderly achieve a higher blood alcohol concentration after drinking the same amount of alcohol.  Exposing a fetus to alcohol can cause a broad range of birth defects referred to as Fetal Alcohol Syndrome (FAS) or Alcohol-Related Birth Defects (ARBD). Although FAS/ARBD is connected with excessive alcohol consumption during pregnancy, studies also have reported neurobehavioral problems in infants born to mothers reporting drinking an average of 1 drink per day during pregnancy.  Heavier drinking (the consumption of more than 4 drinks per occasion by men and more than 3 drinks per occasion by women) impairs learning (cognitive) and psychomotor functions and increases the risk of alcohol-related problems, including accidents and injuries. CAGE QUESTIONS:   Have you ever felt that you should Cut down on your drinking?  Have people Annoyed you by criticizing your drinking?  Have you ever felt bad or Guilty about your drinking?  Have you ever had a drink first thing in the morning to steady your nerves or get rid of a hangover (Eye opener)? If you answered positively to any of these questions: You may be at risk for alcohol-related problems if alcohol consumption is:   Men: Greater than 14 drinks per week or more than 4 drinks per occasion.  Women: Greater than 7 drinks per week or more than 3 drinks per occasion. Do you or your family have a medical history of alcohol-related problems, such as:  Blackouts.  Sexual dysfunction.  Depression.  Trauma.  Liver dysfunction.  Sleep disorders.  Hypertension.  Chronic abdominal pain.  Has your drinking ever caused you problems, such as problems with your family, problems with your work (or school) performance, or accidents/injuries?  Do you have a compulsion to drink or a preoccupation with  drinking?  Do you have poor control or are you unable to stop drinking once you have started?  Do you have to drink to avoid withdrawal symptoms?  Do you have problems with withdrawal such as tremors, nausea, sweats, or mood disturbances?  Does it take more alcohol than in the past to get you high?  Do you feel a strong urge to drink?  Do you change your plans so that you can have a drink?  Do you ever drink in the morning to relieve the shakes or a hangover? If you have answered a number of the previous questions positively, it may be time for you to talk to your caregivers, family, and friends and see if they think you have a problem. Alcoholism is a chemical dependency that keeps getting worse and will eventually destroy your health and relationships. Many alcoholics end up dead, impoverished, or in prison. This is often the end result of all chemical dependency.  Do not be discouraged if you are not ready to take action immediately.  Decisions to change behavior often involve up and down desires to change and feeling like you cannot decide.  Try to think more seriously about your drinking behavior.  Think of the reasons to quit. WHERE TO GO FOR ADDITIONAL INFORMATION   The [place] on Alcohol Abuse and Alcoholism ([place]) http://www.bradshaw.com/  [employer] on  Alcoholism and Drug Dependence (NCADD) www.ncadd.org  American Society of Addiction Medicine (ASAM) [employer]  Document Released: 09/06/2005 Document Revised: 11/29/2011 Document Reviewed: 04/24/2008 [facility] Patient Information 2014 [place], [place].  Alcohol Intoxication Alcohol intoxication occurs when the amount of alcohol that a person has consumed impairs his or her ability to mentally and physically function. Alcohol directly impairs the normal chemical activity of the brain. Drinking large amounts of alcohol can lead to changes in mental function and behavior, and it can cause many physical effects  that can be harmful.  Alcohol intoxication can range in severity from mild to very severe. Various factors can affect the level of intoxication that occurs, such as the person's age, gender, weight, frequency of alcohol consumption, and the presence of other medical conditions (such as diabetes, seizures, or heart conditions). Dangerous levels of alcohol intoxication may occur when people drink large amounts of alcohol in a short period (binge drinking). Alcohol can also be especially dangerous when combined with certain prescription medicines or "recreational" drugs. SIGNS AND SYMPTOMS Some common signs and symptoms of mild alcohol intoxication include:  Loss of coordination.  Changes in mood and behavior.  Impaired judgment.  Slurred speech. As alcohol intoxication progresses to more severe levels, other signs and symptoms will appear. These may include:  Vomiting.  Confusion and impaired memory.  Slowed breathing.  Seizures.  Loss of consciousness. DIAGNOSIS  Your health care provider will take a medical history and perform a physical exam. You will be asked about the amount and type of alcohol you have consumed. Blood tests will be done to measure the concentration of alcohol in your blood. In many places, your blood alcohol level must be lower than 80 mg/dL (1.61%) to legally drive. However, many dangerous effects of alcohol can occur at much lower levels.  TREATMENT  People with alcohol intoxication often do not require treatment. Most of the effects of alcohol intoxication are temporary, and they go away as the alcohol naturally leaves the body. Your health care provider will monitor your condition until you are stable enough to go home. Fluids are sometimes given through an IV access tube to help prevent dehydration.  HOME CARE INSTRUCTIONS  Do not drive after drinking alcohol.  Stay hydrated. Drink enough water and fluids to keep your urine clear or pale yellow. Avoid  caffeine.   Only take over-the-counter or prescription medicines as directed by your health care provider.  SEEK MEDICAL CARE IF:   You have persistent vomiting.   You do not feel better after a few days.  You have frequent alcohol intoxication. Your health care provider can help determine if you should see a substance use treatment counselor. SEEK IMMEDIATE MEDICAL CARE IF:   You become shaky or tremble when you try to stop drinking.   You shake uncontrollably (seizure).   You throw up (vomit) blood. This may be bright red or may look like black coffee grounds.   You have blood in your stool. This may be bright red or may appear as a black, tarry, bad smelling stool.   You become lightheaded or faint.  MAKE SURE YOU:   Understand these instructions.  Will watch your condition.  Will get help right away if you are not doing well or get worse. Document Released: 06/16/2005 Document Revised: 05/09/2013 Document Reviewed: 02/09/2013 [facility] Patient Information 2014 [place], [place].  Drug Abuse and Addiction in Sports There are many types of drugs that one may become addicted to including illegal drugs (marijuana, cocaine,  amphetamines, hallucinogens, and narcotics), prescription drugs (hydrocodone, codeine, and alprazolam), and other chemicals such as alcohol or nicotine. Two types of addiction exist: physical and emotional. Physical addiction usually occurs after prolonged use of a drug. However, some drugs may only take a couple uses before addiction can occur. Physical addiction is marked by withdrawal symptoms, in which the person experiences negative symptoms such as sweat, anxiety, tremors, hallucinations, or cravings in the absence of using the drug. Emotional dependence is the psychological desire for the "high" that the drugs produce when taken. SYMPTOMS   Inattentiveness.  Negligence.  Forgetfulness.  Insomnia.  Mood swings. RISK INCREASES WITH:   Family  history of addiction.  Personal history of addictive personality. Studies have shown that risktakers, which many athletes are, have a higher risk of addiction. PREVENTION The only adequate prevention of drug abuse is abstinence from drugs. TREATMENT  The first step in quitting substance abuse is recognizing the problem and realizing that one has the power to change. Quitting requires a plan and support from others. It is often necessary to seek medical assistance. Caregivers are available to offer counseling, and for certain cases, medicine to diminish the physical symptoms of withdrawal. Many organizations exist such as Alcoholics Anonymous, Narcotics Anonymous, or the [employer] on Alcoholism that offer support for individuals who have chosen to quit their habits. Document Released: 09/06/2005 Document Revised: 11/29/2011 Document Reviewed: 12/19/2008 [facility] Patient Information 2014 [place], [place].  Depression, Adult Depression is feeling sad, low, down in the dumps, blue, gloomy, or empty. In general, there are two kinds of depression:  Normal sadness or grief. This can happen after something upsetting. It often goes away on its own within 2 weeks. After losing a loved one (bereavement), normal sadness and grief may last longer than two weeks. It usually gets better with time.  Clinical depression. This kind lasts longer than normal sadness or grief. It keeps you from doing the things you normally do in life. It is often hard to function at home, work, or at school. It may affect your relationships with others. Treatment is often needed. GET HELP RIGHT AWAY IF:  You have thoughts about hurting yourself or others.  You lose touch with reality (psychotic symptoms). You may:  See or hear things that are not real.  Have untrue beliefs about your life or people around you.  Your medicine is giving you problems. MAKE SURE YOU:  Understand these instructions.  Will watch your  condition.  Will get help right away if you are not doing well or get worse. Document Released: 10/09/2010 Document Revised: 05/31/2012 Document Reviewed: 01/06/2012 [facility] Patient Information 2014 [place], [place].  Depression, Adult Depression refers to feeling sad, low, down in the dumps, blue, gloomy, or empty. In general, there are two kinds of depression: 1. Depression that we all experience from time to time because of upsetting life experiences, including the loss of a job or the ending of a relationship (normal sadness or normal grief). This kind of depression is considered normal, is short lived, and resolves within a few days to 2 weeks. (Depression experienced after the loss of a loved one is called bereavement. Bereavement often lasts longer than 2 weeks but normally gets better with time.) 2. Clinical depression, which lasts longer than normal sadness or normal grief or interferes with your ability to function at home, at work, and in school. It also interferes with your personal relationships. It affects almost every aspect of your life. Clinical depression is  an illness. Symptoms of depression also can be caused by conditions other than normal sadness and grief or clinical depression. Examples of these conditions are listed as follows:  Physical illness Some physical illnesses, including underactive thyroid gland (hypothyroidism), severe anemia, specific types of cancer, diabetes, uncontrolled seizures, heart and lung problems, strokes, and chronic pain are commonly associated with symptoms of depression.  Side effects of some prescription medicine In some people, certain types of prescription medicine can cause symptoms of depression.  Substance abuse Abuse of alcohol and illicit drugs can cause symptoms of depression. SYMPTOMS Symptoms of normal sadness and normal grief include the following:  Feeling sad or crying for short periods of time.  Not caring about anything  (apathy).  Difficulty sleeping or sleeping too much.  No longer able to enjoy the things you used to enjoy.  Desire to be by oneself all the time (social isolation).  Lack of energy or motivation.  Difficulty concentrating or remembering.  Change in appetite or weight.  Restlessness or agitation. Symptoms of clinical depression include the same symptoms of normal sadness or normal grief and also the following symptoms:  Feeling sad or crying all the time.  Feelings of guilt or worthlessness.  Feelings of hopelessness or helplessness.  Thoughts of suicide or the desire to harm yourself (suicidal ideation).  Loss of touch with reality (psychotic symptoms). Seeing or hearing things that are not real (hallucinations) or having false beliefs about your life or the people around you (delusions and paranoia). DIAGNOSIS  The diagnosis of clinical depression usually is based on the severity and duration of the symptoms. Your caregiver also will ask you questions about your medical history and substance use to find out if physical illness, use of prescription medicine, or substance abuse is causing your depression. Your caregiver also may order blood tests. TREATMENT  Typically, normal sadness and normal grief do not require treatment. However, sometimes antidepressant medicine is prescribed for bereavement to ease the depressive symptoms until they resolve. The treatment for clinical depression depends on the severity of your symptoms but typically includes antidepressant medicine, counseling with a mental health professional, or a combination of both. Your caregiver will help to determine what treatment is best for you. Depression caused by physical illness usually goes away with appropriate medical treatment of the illness. If prescription medicine is causing depression, talk with your caregiver about stopping the medicine, decreasing the dose, or substituting another medicine. Depression  caused by abuse of alcohol or illicit drugs abuse goes away with abstinence from these substances. Some adults need professional help in order to stop drinking or using drugs. SEEK IMMEDIATE CARE IF:  You have thoughts about hurting yourself or others.  You lose touch with reality (have psychotic symptoms).  You are taking medicine for depression and have a serious side effect. FOR MORE INFORMATION National Alliance on Mental Illness: www.nami.[employer]: http://www.maynard.net/ Document Released: 09/03/2000 Document Revised: 03/07/2012 Document Reviewed: 12/06/2011 [facility] Patient Information 2014 [place], [place].

## 2013-10-30 NOTE — Consult Note (Signed)
[facility] Face-to-Face Psychiatry Consult   Reason for Consult:  Alcohol intoxication and suicidal ideation Referring Physician:  EDP  Jeancarlo Leffler Jump is an 43 y.o. male. Total Time spent with patient: 30 minutes  Assessment: AXIS I:  Alcohol Abuse, Substance Abuse and Substance Induced Mood Disorder AXIS II:  Deferred AXIS III:   Past Medical History  Diagnosis Date  . Knee pain   . Gastroesophageal reflux disease   . Polysubstance abuse   . Bipolar disorder   . Anxiety and depression   . ADHD, adult residual type    AXIS IV:  other psychosocial or environmental problems and problems related to social environment AXIS V:  61-70 mild symptoms  Plan:  No evidence of imminent risk to self or others at present.   Patient does not meet criteria for psychiatric inpatient admission. Supportive therapy provided about ongoing stressors. Discussed crisis plan, support from social network, calling 911, coming to the Emergency Department, and calling Suicide Hotline.  Subjective:   CLENTON ESPER is a 43 y.o. male patient.  HPI:  Patient presented to [facility] intoxicated agitated, yelling and being belligerent to staff, security, and police.  Today patient is sober and states "I am not suicidal; I am feeling better; I don't need to go into hospital.  I am ready to go home.  I know I said some dumb things and acted like a neanderthal but look at what my situation was.  I've spoken with my spencer and he said that he would work with me. I am going to get back into AA and NA."  HPI Elements:   Location:  Alcohol detox. Quality:  alcohol abuse. Severity:  intoxicated. Timing:  drinks daily.   Review of Systems  Constitutional: Negative for chills, malaise/fatigue and diaphoresis.  Respiratory: Negative for cough and wheezing.   Gastrointestinal: Negative for nausea, vomiting, abdominal pain, diarrhea and constipation.  Musculoskeletal: Negative.   Neurological: Negative for dizziness, tremors  and weakness.  Psychiatric/Behavioral: Positive for substance abuse (Pain pills, THC,Cocaine, alcohol). Negative for depression, suicidal ideas, hallucinations and memory loss. The patient is not nervous/anxious and does not have insomnia.     Past Psychiatric History: Past Medical History  Diagnosis Date  . Knee pain   . Gastroesophageal reflux disease   . Polysubstance abuse   . Bipolar disorder   . Anxiety and depression   . ADHD, adult residual type     reports that he has been smoking.  He has never used smokeless tobacco. He reports that he drinks alcohol. He reports that he uses illicit drugs (Cocaine, Marijuana, and IV). No family history on file.         Allergies:  No Known Allergies  ACT Assessment Complete:  Yes:    Educational Status    Risk to Self: Risk to self Is patient at risk for suicide?: Yes Substance abuse history and/or treatment for substance abuse?: Yes  Risk to Others:    Abuse:    Prior Inpatient Therapy:    Prior Outpatient Therapy:    Additional Information:      Objective: Blood pressure 153/99, pulse 63, temperature 97.6 F (36.4 C), temperature source Oral, resp. rate 18, SpO2 99.00%.There is no height or weight on file to calculate BMI. Results for orders placed during the hospital encounter of 10/28/13 (from the past 72 hour(s))  URINE RAPID DRUG SCREEN (HOSP PERFORMED)     Status: None   Collection Time    10/28/13  2:22 PM  Result Value Range   Opiates NONE DETECTED  NONE DETECTED   Cocaine NONE DETECTED  NONE DETECTED   Benzodiazepines NONE DETECTED  NONE DETECTED   Amphetamines NONE DETECTED  NONE DETECTED   Tetrahydrocannabinol NONE DETECTED  NONE DETECTED   Barbiturates NONE DETECTED  NONE DETECTED   Comment:            DRUG SCREEN FOR MEDICAL PURPOSES     ONLY.  IF CONFIRMATION IS NEEDED     FOR ANY PURPOSE, NOTIFY LAB     WITHIN 5 DAYS.                LOWEST DETECTABLE LIMITS     FOR URINE DRUG SCREEN     Drug Class        Cutoff (ng/mL)     Amphetamine      1000     Barbiturate      200     Benzodiazepine   030     Tricyclics       092     Opiates          300     Cocaine          300     THC              50  ETHANOL     Status: Abnormal   Collection Time    10/28/13  2:28 PM      Result Value Range   Alcohol, Ethyl (B) 297 (*) 0 - 11 mg/dL   Comment:            LOWEST DETECTABLE LIMIT FOR     SERUM ALCOHOL IS 11 mg/dL     FOR MEDICAL PURPOSES ONLY  BASIC METABOLIC PANEL     Status: Abnormal   Collection Time    10/28/13  2:28 PM      Result Value Range   Sodium 133 (*) 137 - 147 mEq/L   Potassium 3.7  3.7 - 5.3 mEq/L   Chloride 96  96 - 112 mEq/L   CO2 20  19 - 32 mEq/L   Glucose, Bld 142 (*) 70 - 99 mg/dL   BUN 4 (*) 6 - 23 mg/dL   Creatinine, Ser 0.83  0.50 - 1.35 mg/dL   Calcium 8.4  8.4 - 10.5 mg/dL   GFR calc non Af Amer >90  >90 mL/min   GFR calc Af Amer >90  >90 mL/min   Comment: (NOTE)     The eGFR has been calculated using the CKD EPI equation.     This calculation has not been validated in all clinical situations.     eGFR's persistently <90 mL/min signify possible Chronic Kidney     Disease.  CBC WITH DIFFERENTIAL     Status: Abnormal   Collection Time    10/28/13  2:28 PM      Result Value Range   WBC 7.5  4.0 - 10.5 K/uL   RBC 4.63  4.22 - 5.81 MIL/uL   Hemoglobin 15.8  13.0 - 17.0 g/dL   HCT 43.6  39.0 - 52.0 %   MCV 94.2  78.0 - 100.0 fL   MCH 34.1 (*) 26.0 - 34.0 pg   MCHC 36.2 (*) 30.0 - 36.0 g/dL   RDW 13.3  11.5 - 15.5 %   Platelets 329  150 - 400 K/uL   Neutrophils Relative % 52  43 - 77 %   Neutro Abs 3.9  1.7 - 7.7 K/uL   Lymphocytes Relative 36  12 - 46 %   Lymphs Abs 2.7  0.7 - 4.0 K/uL   Monocytes Relative 7  3 - 12 %   Monocytes Absolute 0.5  0.1 - 1.0 K/uL   Eosinophils Relative 4  0 - 5 %   Eosinophils Absolute 0.3  0.0 - 0.7 K/uL   Basophils Relative 2 (*) 0 - 1 %   Basophils Absolute 0.1  0.0 - 0.1 K/uL   Labs are reviewed and assessed  for ETOH, illicit drug use and other medical issues. Medications Reviewed.  Patient has librium protocol for alcohol with drawl ; no changes Current Facility-Administered Medications  Medication Dose Route Frequency Provider Last Rate Last Dose  . alum & mag hydroxide-simeth (MAALOX/MYLANTA) 200-200-20 MG/5ML suspension 30 mL  30 mL Oral PRN Alfonzo Feller, DO      . chlordiazePOXIDE (LIBRIUM) capsule 25 mg  25 mg Oral Q6H PRN Dimitri Shakespeare, NP      . chlordiazePOXIDE (LIBRIUM) capsule 25 mg  25 mg Oral TID Meldrick Buttery, NP   25 mg at 10/29/13 2153   Followed by  . chlordiazePOXIDE (LIBRIUM) capsule 25 mg  25 mg Oral BH-qamhs Baylin Gamblin, NP       Followed by  . [START ON 10/31/2013] chlordiazePOXIDE (LIBRIUM) capsule 25 mg  25 mg Oral Daily Jaslin Novitski, NP      . hydrOXYzine (ATARAX/VISTARIL) tablet 25 mg  25 mg Oral Q6H PRN Seriyah Collison, NP   25 mg at 10/29/13 2153  . loperamide (IMODIUM) capsule 2-4 mg  2-4 mg Oral PRN Tidus Upchurch, NP      . multivitamin with minerals tablet 1 tablet  1 tablet Oral Daily Solange Emry, NP   1 tablet at 10/30/13 [phone]  . nicotine (NICODERM CQ - dosed in mg/24 hours) patch 21 mg  21 mg Transdermal Daily Alfonzo Feller, DO   21 mg at 10/30/13 4128  . ondansetron (ZOFRAN) tablet 4 mg  4 mg Oral Q8H PRN Alfonzo Feller, DO      . ondansetron (ZOFRAN-ODT) disintegrating tablet 4 mg  4 mg Oral Q6H PRN Raidyn Wassink, NP      . thiamine (VITAMIN B-1) tablet 100 mg  100 mg Oral Daily Alfonzo Feller, DO   100 mg at 10/30/13 7867   Or  . thiamine (B-1) injection 100 mg  100 mg Intravenous Daily Alfonzo Feller, DO       Current Outpatient Prescriptions  Medication Sig Dispense Refill  . divalproex (DEPAKOTE ER) 250 MG 24 hr tablet Take 250 mg by mouth daily.        Psychiatric Specialty Exam:     Blood pressure 153/99, pulse 63, temperature 97.6 F (36.4 C), temperature source Oral, resp. rate 18, SpO2 99.00%.There is no height or weight  on file to calculate BMI.  General Appearance: Casual  Eye Contact::  Good  Speech:  Clear and Coherent  Volume:  Normal  Mood:  "I feel good; Great"  Affect:  Congruent  Thought Process:  Circumstantial, Coherent and Goal Directed  Orientation:  Full (Time, Place, and Person)  Thought Content:  Rumination  Suicidal Thoughts:  No  Homicidal Thoughts:  No  Memory:  Immediate;   Good Recent;   Good  Judgement:  Fair  Insight:  Good  Psychomotor Activity:  Normal  Concentration:  Fair  Recall:  Good  Fund of Knowledge:Good  Language: Good  Akathisia:  No  Handed:  Right  AIMS (if indicated):     Assets:  Communication Skills Desire for Improvement  Sleep:      Musculoskeletal: Strength & Muscle Tone: within normal limits Gait & Station: normal Patient leans: N/A  Treatment Plan Summary: Outpatient and Rehab resources Disposition:  Discharge home with resource information for outpatient and inpatient rehab facility services  Earleen [place] FNP-BC  Discharge Assessment     Demographic Factors:  Male and Caucasian  Total Time spent with patient: 15 minutes   Mental Status Per Nursing Assessment::   On Admission:     Current Mental Status by Physician: NA and patient denies suicidal/homicidal ideation, psychosis, and paranoia  Loss Factors: NA  Historical Factors: NA  Risk Reduction Factors:   Positive social support and Positive therapeutic relationship  Continued Clinical Symptoms:  Alcohol/Substance Abuse/Dependencies  Cognitive Features That Contribute To Risk:  Patient states that he understands that he needs to go to long term rehab.      Suicide Risk:  Minimal: No identifiable suicidal ideation.  Patients presenting with no risk factors but with morbid ruminations; may be classified as minimal risk based on the severity of the depressive symptoms  Discharge Diagnoses:   AXIS I:  Alcohol Abuse and Substance Abuse AXIS II:  Deferred AXIS III:    Past Medical History  Diagnosis Date  . Knee pain   . Gastroesophageal reflux disease   . Polysubstance abuse   . Bipolar disorder   . Anxiety and depression   . ADHD, adult residual type    AXIS IV:  other psychosocial or environmental problems AXIS V:  61-70 mild symptoms  Plan Of Care/Follow-up recommendations:  Activity:  Resume usual activity Diet:  Resume usual diet  Is patient on multiple antipsychotic therapies at discharge:  No   Has Patient had three or more failed trials of antipsychotic monotherapy by history:  No  Recommended Plan for Multiple Antipsychotic Therapies: NA    Donnelle Olmeda FNP-BC 10/30/2013 10:44 AM

## 2013-11-12 ENCOUNTER — Emergency Department (HOSPITAL_COMMUNITY)
Admission: EM | Admit: 2013-11-12 | Discharge: 2013-11-13 | Disposition: A | Discharge status: Home / Self Care | Payer: BC Managed Care – PPO | Attending: Emergency Medicine | Admitting: Emergency Medicine

## 2013-11-12 ENCOUNTER — Encounter (HOSPITAL_COMMUNITY): Payer: Self-pay | Admitting: Emergency Medicine

## 2013-11-12 DIAGNOSIS — F17200 Nicotine dependence, unspecified, uncomplicated: Secondary | ICD-10-CM | POA: Insufficient documentation

## 2013-11-12 DIAGNOSIS — F1010 Alcohol abuse, uncomplicated: Secondary | ICD-10-CM | POA: Insufficient documentation

## 2013-11-12 DIAGNOSIS — R450 Nervousness: Secondary | ICD-10-CM | POA: Insufficient documentation

## 2013-11-12 DIAGNOSIS — F411 Generalized anxiety disorder: Secondary | ICD-10-CM | POA: Insufficient documentation

## 2013-11-12 DIAGNOSIS — F10929 Alcohol use, unspecified with intoxication, unspecified: Secondary | ICD-10-CM

## 2013-11-12 DIAGNOSIS — F1910 Other psychoactive substance abuse, uncomplicated: Secondary | ICD-10-CM

## 2013-11-12 DIAGNOSIS — F1110 Opioid abuse, uncomplicated: Secondary | ICD-10-CM | POA: Insufficient documentation

## 2013-11-12 DIAGNOSIS — Z79899 Other long term (current) drug therapy: Secondary | ICD-10-CM | POA: Insufficient documentation

## 2013-11-12 DIAGNOSIS — F1410 Cocaine abuse, uncomplicated: Secondary | ICD-10-CM | POA: Insufficient documentation

## 2013-11-12 DIAGNOSIS — Z8719 Personal history of other diseases of the digestive system: Secondary | ICD-10-CM | POA: Insufficient documentation

## 2013-11-12 LAB — CBC WITH DIFFERENTIAL/PLATELET
Basophils Absolute: 0.1 10*3/uL (ref 0.0–0.1)
Basophils Relative: 1 % (ref 0–1)
Eosinophils Absolute: 0.5 10*3/uL (ref 0.0–0.7)
Eosinophils Relative: 6 % — ABNORMAL HIGH (ref 0–5)
HCT: 39.6 % (ref 39.0–52.0)
Hemoglobin: 14.3 g/dL (ref 13.0–17.0)
LYMPHS PCT: 41 % (ref 12–46)
Lymphs Abs: 3 10*3/uL (ref 0.7–4.0)
MCH: 34.6 pg — AB (ref 26.0–34.0)
MCHC: 36.1 g/dL — ABNORMAL HIGH (ref 30.0–36.0)
MCV: 95.9 fL (ref 78.0–100.0)
Monocytes Absolute: 0.5 10*3/uL (ref 0.1–1.0)
Monocytes Relative: 7 % (ref 3–12)
NEUTROS PCT: 45 % (ref 43–77)
Neutro Abs: 3.3 10*3/uL (ref 1.7–7.7)
PLATELETS: 248 10*3/uL (ref 150–400)
RBC: 4.13 MIL/uL — AB (ref 4.22–5.81)
RDW: 13.3 % (ref 11.5–15.5)
WBC: 7.4 10*3/uL (ref 4.0–10.5)

## 2013-11-12 NOTE — ED Provider Notes (Signed)
CSN: [id number]     Arrival date & time 11/12/13  2236 History   First MD Initiated Contact with Patient 11/12/13 2328     Chief Complaint  Patient presents with  . Medical Clearance     (Consider location/radiation/quality/duration/timing/severity/associated sxs/prior Treatment) HPI Comments: Daniel Arroyo is a 43 y.o. Male presenting voluntarily for assistance with polysubstance abuse including etoh, cocaine and heroin abuse.  He reports drinking everyday, all day long since October and last used cocaine 2 days ago.  He drank about 30 beers today,  Last one hour before arrival.  He denies a history of dt's or seizures with etoh withdrawal.  He last tried detox about 5 years ago.  Except for anxiety currently,  He denies any physical complaints.  He also denies suicidal or homicidal ideation.       The history is provided by the patient.    Past Medical History  Diagnosis Date  . Knee pain   . Gastroesophageal reflux disease   . Polysubstance abuse   . Bipolar disorder   . Anxiety and depression   . ADHD, adult residual type    History reviewed. No pertinent past surgical history. History reviewed. No pertinent family history. History  Substance Use Topics  . Smoking status: Current Every Day Smoker -- 1.00 packs/day  . Smokeless tobacco: Never Used  . Alcohol Use: Yes     Comment: daily    Review of Systems  Constitutional: Negative for fever.  HENT: Negative for congestion and sore throat.   Eyes: Negative.   Respiratory: Negative.   Cardiovascular: Negative.  Negative for chest pain.  Gastrointestinal: Negative for abdominal pain.  Genitourinary: Negative.   Musculoskeletal: Negative for arthralgias.  Skin: Negative.  Negative for rash and wound.  Neurological: Negative for dizziness, tremors, seizures and light-headedness.  Psychiatric/Behavioral: Negative for suicidal ideas and hallucinations. The patient is nervous/anxious.       Allergies  Review of  patient's allergies indicates no known allergies.  Home Medications   Current Outpatient Rx  Name  Route  Sig  Dispense  Refill  . alum & mag hydroxide-simeth (MAALOX/MYLANTA) 200-200-20 MG/5ML suspension   Oral   Take 30 mLs by mouth as needed for indigestion or heartburn.   355 mL   0   . divalproex (DEPAKOTE ER) 250 MG 24 hr tablet   Oral   Take 250 mg by mouth daily.          BP 120/82  Pulse 86  Temp(Src) 97.7 F (36.5 C) (Oral)  Resp 20  Ht 6' (1.829 m)  Wt 170 lb (77.111 kg)  BMI 23.05 kg/m2  SpO2 96% Physical Exam  Nursing note and vitals reviewed. Constitutional: He appears well-developed and well-nourished.  HENT:  Head: Normocephalic and atraumatic.  Eyes: Conjunctivae are normal.  Neck: Normal range of motion.  Cardiovascular: Normal rate, regular rhythm, normal heart sounds and intact distal pulses.   Pulmonary/Chest: Effort normal and breath sounds normal. He has no wheezes.  Abdominal: Soft. Bowel sounds are normal. There is no tenderness.  Musculoskeletal: Normal range of motion.  Neurological: He is alert.  Skin: Skin is warm and dry.  Psychiatric: Thought content normal. His mood appears anxious. His speech is slurred. He is not aggressive and not actively hallucinating. He expresses no suicidal plans and no homicidal plans.    ED Course  Procedures (including critical care time) Labs Review Labs Reviewed  CBC WITH DIFFERENTIAL - Abnormal; Notable for the following:  RBC 4.13 (*)    MCH 34.6 (*)    MCHC 36.1 (*)    Eosinophils Relative 6 (*)    All other components within normal limits  BASIC METABOLIC PANEL - Abnormal; Notable for the following:    Sodium 130 (*)    Potassium 3.6 (*)    Chloride 93 (*)    BUN 5 (*)    Calcium 8.3 (*)    All other components within normal limits  ETHANOL - Abnormal; Notable for the following:    Alcohol, Ethyl (B) 234 (*)    All other components within normal limits  SALICYLATE LEVEL - Abnormal;  Notable for the following:    Salicylate Lvl <2.0 (*)    All other components within normal limits  URINALYSIS, ROUTINE W REFLEX MICROSCOPIC - Abnormal; Notable for the following:    Color, Urine STRAW (*)    Specific Gravity, Urine <1.005 (*)    All other components within normal limits  URINE RAPID DRUG SCREEN (HOSP PERFORMED) - Abnormal; Notable for the following:    Tetrahydrocannabinol POSITIVE (*)    All other components within normal limits  ACETAMINOPHEN LEVEL   Imaging Review No results found.  EKG Interpretation   None       MDM   Final diagnoses:  Alcohol intoxication  Polysubstance abuse    Pt who is clinically intoxicated, with desire for assistance with polysubstance abuse.  Discussed with Dr Jodi MourningZavitz who will continue to follow.  Pt will need re-eval once sober, possible psych eval vs giving resources for outpatient f/u and placement.  He denies suicidal/homicidal ideation and is here voluntarily.    Psych orders completed.    Burgess AmorJulie Lenola Lockner, PA-C 11/13/13 0111  Burgess AmorJulie Doaa Kendzierski, PA-C 11/13/13 [phone]

## 2013-11-12 NOTE — ED Notes (Signed)
Patient reports is here to get help for cocaine abuse and alcoholism. Last used cocaine two days ago. Reports drinking approximately 30 beers tonight. Also reports heroin use, last use two days ago.

## 2013-11-13 ENCOUNTER — Encounter (HOSPITAL_COMMUNITY): Payer: Self-pay | Admitting: Emergency Medicine

## 2013-11-13 LAB — URINALYSIS, ROUTINE W REFLEX MICROSCOPIC
Bilirubin Urine: NEGATIVE
Glucose, UA: NEGATIVE mg/dL
Hgb urine dipstick: NEGATIVE
Ketones, ur: NEGATIVE mg/dL
LEUKOCYTES UA: NEGATIVE
NITRITE: NEGATIVE
PH: 6 (ref 5.0–8.0)
Protein, ur: NEGATIVE mg/dL
UROBILINOGEN UA: 0.2 mg/dL (ref 0.0–1.0)

## 2013-11-13 LAB — BASIC METABOLIC PANEL
BUN: 5 mg/dL — AB (ref 6–23)
CO2: 24 meq/L (ref 19–32)
Calcium: 8.3 mg/dL — ABNORMAL LOW (ref 8.4–10.5)
Chloride: 93 mEq/L — ABNORMAL LOW (ref 96–112)
Creatinine, Ser: 0.68 mg/dL (ref 0.50–1.35)
GFR calc Af Amer: >90 mL/min (ref >90)
GFR calc non Af Amer: >90 mL/min (ref >90)
Glucose, Bld: 89 mg/dL (ref 70–99)
POTASSIUM: 3.6 meq/L — AB (ref 3.7–5.3)
SODIUM: 130 meq/L — AB (ref 137–147)

## 2013-11-13 LAB — SALICYLATE LEVEL: Salicylate Lvl: <2 mg/dL — ABNORMAL LOW (ref 2.8–20.0)

## 2013-11-13 LAB — RAPID URINE DRUG SCREEN, HOSP PERFORMED
Amphetamines: NOT DETECTED
Barbiturates: NOT DETECTED
Benzodiazepines: NOT DETECTED
Cocaine: NOT DETECTED
Opiates: NOT DETECTED
Tetrahydrocannabinol: POSITIVE — AB

## 2013-11-13 LAB — ACETAMINOPHEN LEVEL

## 2013-11-13 LAB — ETHANOL
Alcohol, Ethyl (B): 123 mg/dL — ABNORMAL HIGH (ref 0–11)
Alcohol, Ethyl (B): 234 mg/dL — ABNORMAL HIGH (ref 0–11)

## 2013-11-13 MED ORDER — NICOTINE 21 MG/24HR TD PT24
21.0000 mg | MEDICATED_PATCH | Freq: Every day | TRANSDERMAL | Status: DC
  Administered 2013-11-13: 21 mg via TRANSDERMAL
  Filled 2013-11-13: qty 1

## 2013-11-13 MED ORDER — LORAZEPAM 1 MG PO TABS
1.0000 mg | ORAL_TABLET | Freq: Three times a day (TID) | ORAL | Status: DC | PRN
  Administered 2013-11-13: 1 mg via ORAL
  Filled 2013-11-13: qty 1

## 2013-11-13 MED ORDER — SODIUM CHLORIDE 0.9 % IV BOLUS (SEPSIS)
1000.0000 mL | Freq: Once | INTRAVENOUS | Status: AC
  Administered 2013-11-13: 1000 mL via INTRAVENOUS

## 2013-11-13 MED ORDER — IBUPROFEN 800 MG PO TABS
800.0000 mg | ORAL_TABLET | Freq: Four times a day (QID) | ORAL | Status: DC | PRN
  Administered 2013-11-13: 800 mg via ORAL
  Filled 2013-11-13: qty 1

## 2013-11-13 MED ORDER — THIAMINE HCL 100 MG/ML IJ SOLN: 100.0000 mg | Freq: Every day | INTRAMUSCULAR | Status: DC

## 2013-11-13 MED ORDER — VITAMIN B-1 100 MG PO TABS: 100.0000 mg | ORAL_TABLET | Freq: Every day | ORAL | Status: DC

## 2013-11-13 NOTE — ED Notes (Signed)
Patient asking for Nicotine patch

## 2013-11-13 NOTE — ED Provider Notes (Signed)
No suicidal or homicidal ideation. Patient wants to the discharge. Resource guide given for alcohol treatment resources  Donnetta HutchingBrian Baili Stang, MD 11/13/13 1115

## 2013-11-13 NOTE — Progress Notes (Signed)
Daniel Arroyo, MHT contacted attending RN, Lanora Manislizabeth to inquire if patient wanted detox. Lanora Manislizabeth states that patient has declined treatment. Writer confirmed with Darrol Pokearoline Paige, TTS who completed tele assessment that patient denies SI, HI and seek treatment on an outpatient basis. Writer will discontinue placement search efforts for patient.

## 2013-11-13 NOTE — ED Notes (Signed)
Pt anxious, denies being SI/HI, states "I'm ready to go."  When asked if pt wants detox pt states, " I can do that at home.  I'm getting anxious just sitting here."  [facility] and [facility].

## 2013-11-13 NOTE — ED Notes (Signed)
telepsych consult being completed at this time.  

## 2013-11-13 NOTE — ED Provider Notes (Signed)
Medical screening examination/treatment/procedure(s) were conducted as a shared visit with non-physician practitioner(s) or resident  and myself.  I personally evaluated the patient during the encounter and agree with the findings and plan unless otherwise indicated.    I have personally reviewed any xrays and/ or EKG's with the provider and I agree with interpretation.   Polysubstance abuse hx, pt has been inpt treatment once before which helped him stay clean for 4 years.  Last etoh PTA, last cocaine 2 days ago.  No fevers, ha or other symptoms except mild nausea.  No SI.  Exam CNs intact, abd soft/ NT, no nystagmus, no signs of withdrawal, neck supple, lungs clear. TTS consulted as pt clinically sober, medically clear at this time. Plan for decision for inpt vs outpt treatment program.  Polysubstance abuse, alcohol abuse  Enid SkeensJoshua M Melitta Tigue, MD 11/13/13 [phone]

## 2013-11-13 NOTE — BH Assessment (Signed)
Tele Assessment Note   Daniel Arroyo is an 43 y.o. male. Pt is a divorced, Caucasian 44 yo. He presents [facility] to [facility] with request for alcohol and cocaine detox. Pt cooperative. Pt's BAL was 123 at 5:30 am today 11/13/13. UDS was positive for THC. Pt denies SI and HI. Pt denies [facility] and no delusions noted. 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 Pt sts he has lost 20 lbs over past 5 mos unintentionally. Pt sts is isolating. Pt sts he has court date March 9th for traffic violations. Pt has a master's in accounting. Pt sts he drinks approx. a case and a half of beer daily since his relapse in Oct. Prior to Oct, pt had been clean and sober for 4 years. Pt sts he drank a case of beer last night 11/12/13. Pt sts he has smoked THC daily since Oct. Pt sts he smokes an 8 ball of cocaine twice a month, last use was 11/06/13. Daniel Arroyo Pt went to [facility] 20 yrs ago and was at [facility] in 2011 and [place] but doesn't remember date. Pt sts he snorts heroin once a month. Pt describes mood as "anxious and depressed".   Axis I: Alcohol Use Disorder, Severe           Cannabis Use Disorder, Severe           Substance Induced Mood Disorder Axis II: Deferred Axis III:  Past Medical History  Diagnosis Date  . Knee pain   . Gastroesophageal reflux disease   . Polysubstance abuse   . Bipolar disorder   . Anxiety and depression   . ADHD, adult residual type    Axis IV: occupational problems, other psychosocial or environmental problems, problems related to social environment and problems with primary support group Axis V: 41-50 serious symptoms  Past Medical History:  Past Medical History  Diagnosis Date  . Knee pain   . Gastroesophageal reflux disease   . Polysubstance abuse   . Bipolar disorder   . Anxiety and depression   . ADHD, adult residual type     History reviewed. No pertinent past surgical history.  Family History: History reviewed. No pertinent  family history.  Social History:  reports that he has been smoking.  He has never used smokeless tobacco. He reports that he drinks alcohol. He reports that he uses illicit drugs (Cocaine, Marijuana, IV, and Heroin).  Additional Social History:  Alcohol / Drug Use Pain Medications: pt denies abuse Prescriptions: pt denies abuse Over the Counter: pt denies abuse History of alcohol / drug use?: Yes Longest period of sobriety (when/how long): 4 Negative Consequences of Use: Financial;Personal relationships;Work / Mining engineer #1 Name of Substance 1: alcohol 1 - Age of First Use: 13 1 - Amount (size/oz): case and a half of beer 1 - Frequency: daily 1 - Duration: since Oct 1 - Last Use / Amount: 11/12/13 - one case beer Substance #2 Name of Substance 2: marijuana 2 - Age of First Use: 15 2 - Amount (size/oz): varies based on what he can afford 2 - Frequency: daily 2 - Duration: since Oct 2 - Last Use / Amount: 11/06/13 Substance #3 Name of Substance 3: cocaine 3 - Age of First Use: 18 3 - Amount (size/oz): eight ball 3 - Frequency: twice monthly 3 - Duration: since Oct Substance #4 Name of Substance 4: heroin - snorts it 43 - Age of First Use: 18  4 - Amount (size/oz): $20 4 - Frequency: once a month 4 - Duration: since Oct  CIWA: CIWA-Ar BP: 120/72 mmHg Pulse Rate: 77 COWS:    Allergies: No Known Allergies  Home Medications:  (Not in a hospital admission)  OB/GYN Status:  No LMP for male patient.  General Assessment Data [facility] of Assessment: [facility] Is this a Tele or Face-to-Face Assessment?: Tele Assessment Is this an Initial Assessment or a Re-assessment for this encounter?: Initial Assessment Living Arrangements: Alone Can pt return to current living arrangement?: Yes Admission Status: Voluntary Is patient capable of signing voluntary admission?: Yes Transfer from: Home Referral Source: Self/Family/Friend     [facility] Living Arrangements:  Alone  Education Status Is patient currently in school?: No Highest grade of school patient has completed: 6218 Name of school: Master Accounting [employer]  Risk to self Suicidal Ideation: No Suicidal Intent: No Is patient at risk for suicide?: No Suicidal Plan?: No Access to Means: No What has been your use of drugs/alcohol within the last 12 months?: daily use - alcohol & thc, twice monthly cocaine, once monthly heroin Previous Attempts/Gestures: No How many times?: 0 Other Self Harm Risks: none Triggers for Past Attempts:  (n/a) Intentional Self Injurious Behavior: None Family Suicide History: No Recent stressful life event(s): Job Loss;Other (Comment) (relapse in Oct) Persecutory voices/beliefs?: No Depression: Yes Depression Symptoms: Isolating (loss of appetite) Substance abuse history and/or treatment for substance abuse?: Yes Suicide prevention information given to non-admitted patients: Not applicable  Risk to Others Homicidal Ideation: No Thoughts of Harm to Others: No Current Homicidal Intent: No Current Homicidal Plan: No Access to Homicidal Means: No Identified Victim: none History of harm to others?: No Assessment of Violence: None Noted Violent Behavior Description: pt denies hx of violence Does patient have access to weapons?: No Criminal Charges Pending?: No Does patient have a court date: Yes Court Date: 11/29/13 (traffic violations)  Psychosis Hallucinations: None noted Delusions: None noted  Mental Status Report Appear/Hygiene: Other (Comment) (appropriate in hospital gown) Eye Contact: Good Motor Activity: Freedom of movement Speech: Logical/coherent Level of Consciousness: Alert Mood: Depressed;Anxious;Sad Affect: Appropriate to circumstance;Depressed Anxiety Level: Moderate Thought Processes: Coherent;Relevant Judgement: Unimpaired Orientation: Person;Time;Place;Situation Obsessive Compulsive Thoughts/Behaviors: None  Cognitive  Functioning Concentration: Normal Memory: Recent Intact;Remote Intact IQ: Average Insight: Good Impulse Control: Poor Appetite: Poor Weight Loss: 20 (in 5 mos) Sleep: No Change Total Hours of Sleep: 5 Vegetative Symptoms: None  ADLScreening [facility] Assessment Services) Patient's cognitive ability adequate to safely complete daily activities?: Yes Patient able to express need for assistance with ADLs?: Yes Independently performs ADLs?: Yes (appropriate for developmental age)  Prior Inpatient Therapy Prior Inpatient Therapy: Yes Prior Therapy Dates: 1995, 2011, other dates Prior Therapy Facilty/Provider(s): [facility], [facility] Reason for Treatment: substance abuse, detox  Prior Outpatient Therapy Prior Outpatient Therapy: No Prior Therapy Dates: na Prior Therapy Facilty/Provider(s): na Reason for Treatment: na  ADL Screening (condition at time of admission) Patient's cognitive ability adequate to safely complete daily activities?: Yes Is the patient deaf or have difficulty hearing?: No Does the patient have difficulty seeing, even when wearing glasses/contacts?: No Does the patient have difficulty concentrating, remembering, or making decisions?: No Patient able to express need for assistance with ADLs?: Yes Does the patient have difficulty dressing or bathing?: No Independently performs ADLs?: Yes (appropriate for developmental age) Does the patient have difficulty walking or climbing stairs?: No Weakness of Legs: None Weakness of Arms/Hands: None  Home Assistive Devices/Equipment Home Assistive  Devices/Equipment: None    Abuse/Neglect Assessment (Assessment to be complete while patient is alone) Physical Abuse: Denies Verbal Abuse: Denies Sexual Abuse: Denies Exploitation of patient/patient's resources: Denies Self-Neglect: Denies Values / Beliefs Cultural Requests During Hospitalization: None Spiritual Requests During Hospitalization: None   Advance Directives  (For Healthcare) Advance Directive: Patient does not have advance directive;Patient would not like information Nutrition Screen- [facility] Adult/[facility]/AP Patient's home diet: Regular  Additional Information 1:1 In Past 12 Months?: No CIRT Risk: No Elopement Risk: No Does patient have medical clearance?: Yes     Disposition:  Disposition Initial Assessment Completed for this Encounter: Yes Disposition of Patient: Inpatient treatment program Type of inpatient treatment program: Adult (detox bed)  Azana Kiesler P 11/13/2013 9:27 AM

## 2013-11-13 NOTE — ED Notes (Signed)
Pt c/o headache and anxiety.  meds given.

## 2013-11-13 NOTE — ED Notes (Signed)
[facility] - pt is okay to be discharged with resources for detox if pt chooses to do so.  Dr. Adriana Simasook notified.

## 2013-11-13 NOTE — Discharge Instructions (Signed)
°Emergency Department Resource Guide °1) Find a Doctor and Pay Out of Pocket °Although you won't have to find out who is covered by your insurance plan, it is a good idea to ask around and get recommendations. You will then need to call the office and see if the doctor you have chosen will accept you as a new patient and what types of options they offer for patients who are self-pay. Some doctors offer discounts or will set up payment plans for their patients who do not have insurance, but you will need to ask so you aren't surprised when you get to your appointment. ° °2) Contact Your Local Health Department °Not all health departments have doctors that can see patients for sick visits, but many do, so it is worth a call to see if yours does. If you don't know where your local health department is, you can check in your phone book. The CDC also has a tool to help you locate your state's health department, and many state websites also have listings of all of their local health departments. ° °3) Find a Walk-in Clinic °If your illness is not likely to be very severe or complicated, you may want to try a walk in clinic. These are popping up all over the country in pharmacies, drugstores, and shopping centers. They're usually staffed by nurse practitioners or physician assistants that have been trained to treat common illnesses and complaints. They're usually fairly quick and inexpensive. However, if you have serious medical issues or chronic medical problems, these are probably not your best option. ° °No Primary Care Doctor: °- Call Health Connect at  832-8000 - they can help you locate a primary care doctor that  accepts your insurance, provides certain services, etc. °- Physician Referral Service- 1-800-533-3463 ° °Chronic Pain Problems: °Organization         Address  Phone   Notes  °[facility]  (336) 297-2271 Patients need to be referred by their primary care doctor.  ° °Medication  Assistance: °Organization         Address  Phone   Notes  °[place] [facility] [address] °[place], [place] [address] (336) 641-8030 --Must be a resident of [place] °-- Must have NO insurance coverage whatsoever (no Medicaid/ Medicare, etc.) °-- The pt. MUST have a primary care doctor that directs their care regularly and follows them in the community °  °[facility]  (866) 331-1348   °United Way  (888) 892-1162   ° °Agencies that provide inexpensive medical care: °Organization         Address  Phone   Notes  °[facility]  (336) 832-8035   °[facility]    (336) 832-7272   °[facility] [address] °[place], [place] [address] (336) 832-4777   °[facility] [place] [facility] [address], °[place] (336) 271-4999   °[facility]    (336) 373-0678   °[facility]    (336) 272-1050   °[facility] ° [address], [place] Phone:  (336) 832-4444, Fax:  (336) 832-4440 Hours of Operation:  9 am - 6 pm, M-F.  Also accepts Medicaid/Medicare and self-pay.  °[facility] ° [address], [place] Phone: (336) [phone], Fax: (336) [phone]. Hours of Operation:  8:30 am - 5:30 pm, M-F.  Also accepts Medicaid and self-pay.  °[facility] [place] [address], [place] Phone: (336) 878-6027   °[facility] [address], [place], [place] (336)723-1848, Ext. 123 Mondays & Thursdays: 7-9 AM.  First 15 patients are seen on a first come, first serve basis. °  ° °Medicaid-accepting [place] Providers: ° °Organization         Address  Phone   Notes  °[facility] [address], [place] (336) 641-2100 Also accepts self-pay patients.  °[facility] [address], [place] ° (336) 856-9996   °[facility] [address], [place]  (336) 288-8857   °[facility] [address] [place] [address], [place] (336) 299-7000   °Veita Bland [address], [place]  ° (336) 373-1557 Only accepts [place] Access Medicaid patients after they have their name applied to their card.  ° °Self-Pay (no insurance) in [place]: ° °Organization         Address  Phone   Notes  °Sickle Cell Patients, [facility] [address], [place] (336) 832-1970   °[facility] [address], [place] (336) 832-4400   °[facility] ° [address], [place] (336) 992-4800   °[facility]/Dr. Osei-Bonsu ° [address] [place] [address], [place] or [address], [place] (336) [phone] Phone number for both [place] and [place] locations is the same.  °[facility] [address], [place] (336) 299-0000   °[facility] [address] [place] [address], [place] or [address] (336) 852-7530 °(336) 878-2260   °[facility] [address], [place] (336) 350-1642, phone; (336) 294-5005, fax Sees patients 1st and 3rd Saturday of every month.  Must not qualify for public or private insurance (i.e. Medicaid, Medicare, [place] Health Choice, Veterans' Benefits) • Household income should be no more than 200% of the poverty level •The clinic cannot treat you if you are pregnant or think you are pregnant • Sexually transmitted diseases are not treated at the clinic.  ° ° °Dental Care: °Organization         Address  Phone  Notes  °[place] [facility] [address], [place] (336) 641-6152 Accepts children up to age 21 who are enrolled in Medicaid or [place] Health Choice; pregnant women with a Medicaid card; and children who have applied for Medicaid or [place] Health Choice, but were declined, whose parents can pay a reduced fee at time of service.  °[place]  [facility] [place]  [address], [place] (336) 641-7733 Accepts children up to age 21 who are enrolled in Medicaid or [place] Health Choice; pregnant women with a Medicaid card; and children who have applied for Medicaid or [place] Health Choice, but were declined, whose parents can pay a reduced fee at time of service.  °[facility] ° [address], [place] (336) 641-4533 Patients are seen by appointment only. Walk-ins are not accepted. [facility] will see patients 18 years of age and older. °Monday - Tuesday (8am-5pm) °Most Wednesdays (8:30-5pm) °$30 per visit, cash only  °[facility] ° [address], [place] (336) 641-4533 Patients are seen by appointment only. Walk-ins are not accepted. [facility] will see patients 18 years of age and older. °One   Wednesday Evening (Monthly: Volunteer Based).  $30 per visit, cash only  °[facility]  (919) [phone] for adults; Children under age 4, call Graduate Pediatric Dentistry at ([phone]) [phone]. Children aged 4-14, please call ([phone]) [phone] to request a pediatric application. ° Dental services are provided in all areas of dental care including fillings, crowns and bridges, complete and partial dentures, implants, gum treatment, root canals, and extractions. Preventive care is also provided. Treatment is provided to both adults and children. °Patients are selected via a lottery and there is often a waiting list. °  °[facility] [address], °[place] ° (336) 763-8833 [email or web address] °  °[facility] [address], [place], [place] (336)723-1848, Ext. 123 Second and Fourth Thursday of each month, opens at 6:30 AM; Clinic ends at 9 AM.  Patients are seen on a first-come first-served basis, and a limited number are seen during each clinic.  ° °[facility] ° [address], [place], [place] (336) 723-7904    Eligibility Requirements °You must have lived in [place], [place], or [place] counties for at least the last three months. °  You cannot be eligible for state or federal sponsored healthcare insurance, including Veterans Administration, Medicaid, or Medicare. °  You generally cannot be eligible for healthcare insurance through your employer.  °  How to apply: °Eligibility screenings are held every Tuesday and Wednesday afternoon from 1:00 pm until 4:00 pm. You do not need an appointment for the interview!  °[facility] [address], [place], [place] 336-631-2330   °[place] [facility]  336-342-8273   °[place] [facility]  336-703-3100   °[facility]  336-570-6415   ° °Behavioral Health Resources in the Community: °Intensive Outpatient Programs °Organization         Address  Phone  Notes  °[facility] [address], [place], [place] 336-878-6098   °[facility] [address], [place], [place] 336-832-9800   °[facility] [address], [place], [place] ° 336-882-2125   °[facility] [address],  °[place], [place] 1-800-853-5163 or 336-641-4981   °Substance Abuse Resources °Organization         Address  Phone  Notes  °[facility]  336-882-2125   °[facility]  336-784-9470   °[facility]  336-285-9073   °[facility]  336-845-3988   °Residential & Outpatient Substance Abuse Program  1-800-659-3381   °Psychological Services °Organization         Address  Phone  Notes  °[facility]  336- 832-9600   °[facility]  336- 378-7881   °[facility] [address], [place] 1-800-853-5163 or 336-641-4981   ° °Mobile Crisis Teams °Organization         Address  Phone  Notes  °[facility], Mobile Crisis Care Unit  1-877-626-1772   °Assertive °[facility] ° [address]  [place], [place] 336-834-9664   °Sharon DeEsch [address] °[place] 336-554-5454   ° °Self-Help/Support Groups °Organization         Address  Phone             Notes  °[facility]oc[facility] of  - variety of support groups  336- 373-1402 Call for more information  °Narcotics Anonymous (NA), [facility] [address], °[place]  2 meetings at this location  ° °  Residential Treatment Programs Organization         Address  Phone  Notes  [facility] [address],    [place]  [phone]   [facility]  [address], [place] [phone], [place], [place] [phone]   [facility] [address], [place] [phone] Admissions: 8am-3pm M-F  [facility] [address]    [place], [place] [phone]   [facility] [address], [place], [place] [phone]   [facility] [address]  [place], [place] [phone]   [facility] Intensive Outpatient [address], [place], [place] [phone]   [facility] ([facility] [address]  [place], [place] [phone] or [phone]   [facility] ([facility]) [address] [place], [place] [phone] Accepts Medicaid  [facility] [address]  [place] [phone] Substance Abuse/Addiction Treatment   [facility] Resources Organization         Address  Phone  Notes  [facility]  [phone](888) [phone]   Angie FavaJulie Brannon, PhD [address], [address] [place], [place]   [phone](336) [phone] or [phone](336) [phone]   [facility]   [address] [place], [place] [phone]) [phone]   [facility] [address], [place], [place] [phone](336) [phone] Insurance/Medicaid/sponsorship through [facility] [address]                                    [place], [place] [phone]) [phone] Therapy/tele-psych/case    [facility] [address]   [place], [place] [phone]) [phone]    Dr. Lolly MustacheArfeen  [phone]) [phone]   [facility] [place]  [employer] [facility] 1) [address], [place] 2) [address], [place] 3)  [address], [place] [phone](336) [phone]) [phone]  [phone]) [phone]   [facility] Child Abuse Hotline 732-105-9064(336) 445 140 7252 or [phone](336) [phone] (After Hours)       Followup at community mental health resources. Resource guide given.

## 2013-11-13 NOTE — ED Notes (Signed)
Pt upset and using foul language toward security due to pt having to be wanded for the second time after he was changed into the blue scrubs. I advised pt that we do not use that kind of language here. Sitter at bedside.

## 2013-12-11 ENCOUNTER — Ambulatory Visit (HOSPITAL_COMMUNITY)
Admission: RE | Admit: 2013-12-11 | Discharge: 2013-12-11 | Disposition: A | Discharge status: Home / Self Care | Payer: BC Managed Care – PPO | Attending: Psychiatry | Admitting: Psychiatry

## 2013-12-11 ENCOUNTER — Encounter (HOSPITAL_COMMUNITY): Payer: Self-pay | Admitting: Emergency Medicine

## 2013-12-11 ENCOUNTER — Emergency Department (HOSPITAL_COMMUNITY)
Admission: EM | Admit: 2013-12-11 | Discharge: 2013-12-11 | Disposition: A | Discharge status: Home / Self Care | Payer: BC Managed Care – PPO | Attending: Emergency Medicine | Admitting: Emergency Medicine

## 2013-12-11 DIAGNOSIS — Z79899 Other long term (current) drug therapy: Secondary | ICD-10-CM | POA: Insufficient documentation

## 2013-12-11 DIAGNOSIS — F1410 Cocaine abuse, uncomplicated: Secondary | ICD-10-CM

## 2013-12-11 DIAGNOSIS — F1010 Alcohol abuse, uncomplicated: Secondary | ICD-10-CM

## 2013-12-11 DIAGNOSIS — F17200 Nicotine dependence, unspecified, uncomplicated: Secondary | ICD-10-CM | POA: Insufficient documentation

## 2013-12-11 DIAGNOSIS — F909 Attention-deficit hyperactivity disorder, unspecified type: Secondary | ICD-10-CM | POA: Insufficient documentation

## 2013-12-11 DIAGNOSIS — K219 Gastro-esophageal reflux disease without esophagitis: Secondary | ICD-10-CM | POA: Insufficient documentation

## 2013-12-11 NOTE — BH Assessment (Signed)
Pt [facility]. Per Steward DroneBrenda at [facility] no male beds male beds only. EDP and examining PA will discharge home and asked that this writer provides patient with a list of substance abuse referrals.

## 2013-12-11 NOTE — ED Provider Notes (Signed)
CSN: [id number]     Arrival date & time 12/11/13  1652 History   First MD Initiated Contact with Patient 12/11/13 1737     Chief Complaint  Patient presents with  . Medical Clearance     (Consider location/radiation/quality/duration/timing/severity/associated sxs/prior Treatment) HPI Comments: [facility] from [facility]. States he drinks greater than a case of beer per day. Also does an 'eight ball' cocaine every 3 days. Denies other substances. No history of withdrawal seizures or DTs. No SI/HI. No medical complaints. The onset of this condition was acute. The course is constant. Aggravating factors: none. Alleviating factors: none.    The history is provided by the patient and medical records.    Past Medical History  Diagnosis Date  . Knee pain   . Gastroesophageal reflux disease   . Polysubstance abuse   . Bipolar disorder   . Anxiety and depression   . ADHD, adult residual type    No past surgical history on file. No family history on file. History  Substance Use Topics  . Smoking status: Current Every Day Smoker -- 1.00 packs/day  . Smokeless tobacco: Never Used  . Alcohol Use: Yes     Comment: daily    Review of Systems  Constitutional: Negative for fever.  HENT: Negative for rhinorrhea and sore throat.   Eyes: Negative for redness.  Respiratory: Negative for cough.   Cardiovascular: Negative for chest pain.  Gastrointestinal: Negative for nausea, vomiting, abdominal pain and diarrhea.  Genitourinary: Negative for dysuria.  Musculoskeletal: Negative for myalgias.  Skin: Negative for rash.  Neurological: Negative for headaches.      Allergies  Review of patient's allergies indicates no known allergies.  Home Medications   Current Outpatient Rx  Name  Route  Sig  Dispense  Refill  . amphetamine-dextroamphetamine (ADDERALL XR) 20 MG 24 hr capsule   Oral   Take 1 capsule by mouth daily.         Marland Kitchen. omeprazole (PRILOSEC) 40 MG  capsule   Oral   Take 1 capsule by mouth daily.          BP 114/76  Pulse 78  Temp(Src) 98.2 F (36.8 C) (Oral)  Resp 16  SpO2 97% Physical Exam  Nursing note and vitals reviewed. Constitutional: He appears well-developed and well-nourished.  HENT:  Head: Normocephalic and atraumatic.  Eyes: Conjunctivae are normal. Right eye exhibits no discharge. Left eye exhibits no discharge.  Neck: Normal range of motion. Neck supple.  Cardiovascular: Normal rate, regular rhythm and normal heart sounds.   Pulmonary/Chest: Effort normal and breath sounds normal.  Abdominal: Soft. There is no tenderness.  Neurological: He is alert.  Skin: Skin is warm and dry.  Psychiatric: He has a normal mood and affect. He expresses no homicidal and no suicidal ideation.    ED Course  Procedures (including critical care time) Labs Review Labs Reviewed - No data to display Imaging Review No results found.   EKG Interpretation None      Patient seen and examined. Informed that all detox is full. I spoke with [place] who saw and provided referrals. D/c to home.   Vital signs reviewed and are as follows: Filed Vitals:   12/11/13 1711  BP: 114/76  Pulse: 78  Temp: 98.2 F (36.8 C)  Resp: 16   Patient urged to return with worsening symptoms or other concerns. Patient verbalized understanding and agrees with plan.   Patient walked out after referrals given, stating he did  not want discharge paperwork.    MDM   Final diagnoses:  Alcohol abuse  Cocaine abuse   No signs of active withdrawal.     Renne Crigler, PA-C 12/11/13 2019

## 2013-12-11 NOTE — ED Notes (Signed)
Pt here for detox from etoh and cocaine w/o SI/HI.  Drinks a case or more a day.

## 2013-12-11 NOTE — BH Assessment (Signed)
Assessment Note  Daniel Arroyo is an 43 y.o. male who [facility] seeking detox from alcohol.  He also reports using cocaine.  Mr Daniel Arroyo states he was sober for 4-5 years and then relapsed 6 mos ago.  He reports drinking between 18 and 24 beers daily and using an 8 ball of cocaine over 3 days.  He reports his last use of alcohol was an hour ago and his last use of cocaine was earlier today.  He states that he gets extreme anxiety once the alcohol starts wearing off, so he drinks again.  He reports he wants treatment because he can't live like this any longer.  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  He reports he has no family support.  He states he's lost 30 lbs in the last 3 mos.  [facility] and maintained his sobriety through the support of AA.  He denies HI, now or in the last six months, he denies AVH, he denies SI, now or in the last six months, but does endorse anxiety, frequent panic attacks, and depression including feelings of worthlessness, irritability, fatigue, insomnia, anhedonia, isolating behavior, and feelings of guilt.  Mr Daniel Arroyo was reviewed with Fransisca KaufmannLaura Davis, [facility], and [facility].  Once medically clear, he will be referred for detox.   Axis I: Generalized Anxiety Disorder and Substance Abuse Axis II: Deferred Axis III:  Past Medical History  Diagnosis Date  . Knee pain   . Gastroesophageal reflux disease   . Polysubstance abuse   . Bipolar disorder   . Anxiety and depression   . ADHD, adult residual type    Axis IV: economic problems, housing problems, problems related to legal system/crime, problems with access to health care services and problems with primary support group Axis V: 41-50 serious symptoms  Past Medical History:  Past Medical History  Diagnosis Date  . Knee pain   . Gastroesophageal reflux disease   . Polysubstance abuse   .  Bipolar disorder   . Anxiety and depression   . ADHD, adult residual type     No past surgical history on file.  Family History: No family history on file.  Social History:  reports that he has been smoking.  He has never used smokeless tobacco. He reports that he drinks alcohol. He reports that he uses illicit drugs (Cocaine, Marijuana, IV, and Heroin).  Additional Social History:  Alcohol / Drug Use Pain Medications: pt denies abuse Prescriptions: pt denies abuse Over the Counter: pt denies abuse  CIWA:   COWS:    Allergies: No Known Allergies  Home Medications:  (Not in a hospital admission)  OB/GYN Status:  No LMP for male patient.  General Assessment Data Is this a Tele or Face-to-Face Assessment?: Face-to-Face Is this an Initial Assessment or a Re-assessment for this encounter?: Initial Assessment Living Arrangements: Alone Can pt return to current living arrangement?: Yes Admission Status: Voluntary Is patient capable of signing voluntary admission?: Yes Transfer from: Home Referral Source: Self/Family/Friend     [facility] Living Arrangements: Alone  Education Status Is patient currently in school?: No Highest grade of school patient has completed: Master's Degree in Accounting  Risk to self Suicidal Ideation: No Suicidal Intent: No Is patient at risk for suicide?: No Suicidal Plan?: No Access to Means: No What has been your use of drugs/alcohol  within the last 12 months?: ongoing Previous Attempts/Gestures: No How many times?: 0 Other Self Harm Risks: none Intentional Self Injurious Behavior: None Family Suicide History: No Recent stressful life event(s): Job Loss Persecutory voices/beliefs?: No Depression: Yes Depression Symptoms: Isolating;Despondent;Insomnia;Tearfulness;Fatigue;Guilt;Loss of interest in usual pleasures;Feeling angry/irritable;Feeling worthless/self pity Substance abuse history and/or treatment for substance abuse?:  No Suicide prevention information given to non-admitted patients: Not applicable  Risk to Others Homicidal Ideation: No Thoughts of Harm to Others: No Current Homicidal Intent: No Current Homicidal Plan: No Access to Homicidal Means: No History of harm to others?: No Assessment of Violence: None Noted Violent Behavior Description: denies Does patient have access to weapons?: No Criminal Charges Pending?: No Does patient have a court date: Yes Court Date:  (sometime in March for failure to appear for a traffic case)  Psychosis Hallucinations: None noted Delusions: None noted  Mental Status Report Appear/Hygiene: Other (Comment) (unremarkable-slumped over sleeping) Eye Contact: Poor (falling asleep) Motor Activity: Freedom of movement Speech: Logical/coherent Level of Consciousness: Alert Mood: Depressed;Anxious Affect: Appropriate to circumstance;Depressed Anxiety Level: Panic Attacks Panic attack frequency: daily as soon as sobering up Most recent panic attack: and hour ago Thought Processes: Coherent;Relevant Judgement: Impaired Orientation: Person;Time;Place;Situation Obsessive Compulsive Thoughts/Behaviors: Moderate  Cognitive Functioning Concentration: Decreased Memory: Recent Impaired;Remote Intact IQ: Average Insight: Good Impulse Control: Poor Appetite: Poor Weight Loss: 30 (in 3 mos) Weight Gain: 0 Sleep: Decreased Total Hours of Sleep: 5 Vegetative Symptoms: Decreased grooming  ADLScreening [facility]) Patient's cognitive ability adequate to safely complete daily activities?: Yes Patient able to express need for assistance with ADLs?: Yes Independently performs ADLs?: Yes (appropriate for developmental age)  Prior Inpatient Therapy Prior Inpatient Therapy: Yes Prior Therapy Dates: 1995, 2011, other dates Prior Therapy Facilty/Provider(s): [facility], [facility], [facility] Reason for Treatment: substance abuse, detox  Prior Outpatient  Therapy Prior Outpatient Therapy: Yes Prior Therapy Dates: ongoing until 6 mos ago Prior Therapy Facilty/Provider(s): [facility] Reason for Treatment: SA  ADL Screening (condition at time of admission) Patient's cognitive ability adequate to safely complete daily activities?: Yes Patient able to express need for assistance with ADLs?: Yes Independently performs ADLs?: Yes (appropriate for developmental age)       Abuse/Neglect Assessment (Assessment to be complete while patient is alone) Physical Abuse: Denies Verbal Abuse: Denies Sexual Abuse: Denies     Advance Directives (For Healthcare) Advance Directive: Patient does not have advance directive;Patient would not like information Pre-existing out of facility DNR order (yellow form or pink MOST form): No Nutrition Screen- [facility] Adult/[facility]/AP Patient's home diet: Regular  Additional Information 1:1 In Past 12 Months?: No CIRT Risk: No Elopement Risk: No Does patient have medical clearance?: Yes     Disposition:  Disposition Initial Assessment Completed for this Encounter: Yes Disposition of Patient: Inpatient treatment program Type of inpatient treatment program: Adult  On Site Evaluation by:   Reviewed with Physician:    Steward Ros 12/11/2013 5:11 PM

## 2013-12-11 NOTE — ED Notes (Signed)
Pt angry and states he wants to leave prior to discharge papers.

## 2013-12-12 NOTE — ED Provider Notes (Signed)
  Medical screening examination/treatment/procedure(s) were performed by non-physician practitioner and as supervising physician I was immediately available for consultation/collaboration.   EKG Interpretation None         Jaydynn Wolford, MD 12/12/13 0005 

## 2013-12-13 ENCOUNTER — Encounter (HOSPITAL_COMMUNITY): Payer: Self-pay | Admitting: Emergency Medicine

## 2013-12-13 ENCOUNTER — Inpatient Hospital Stay (HOSPITAL_COMMUNITY)
Admission: AD | Admit: 2013-12-13 | Discharge: 2013-12-17 | DRG: 897 | Disposition: A | Discharge status: Home / Self Care | Payer: Federal, State, Local not specified - Other | Source: Intra-hospital | Attending: Psychiatry | Admitting: Psychiatry

## 2013-12-13 ENCOUNTER — Emergency Department (HOSPITAL_COMMUNITY)
Admission: EM | Admit: 2013-12-13 | Discharge: 2013-12-13 | Disposition: A | Discharge status: Other Acute Inpatient Hospital | Payer: BC Managed Care – PPO | Attending: Emergency Medicine | Admitting: Emergency Medicine

## 2013-12-13 DIAGNOSIS — F411 Generalized anxiety disorder: Secondary | ICD-10-CM | POA: Insufficient documentation

## 2013-12-13 DIAGNOSIS — F17200 Nicotine dependence, unspecified, uncomplicated: Secondary | ICD-10-CM | POA: Diagnosis present

## 2013-12-13 DIAGNOSIS — F1410 Cocaine abuse, uncomplicated: Secondary | ICD-10-CM | POA: Diagnosis present

## 2013-12-13 DIAGNOSIS — K219 Gastro-esophageal reflux disease without esophagitis: Secondary | ICD-10-CM | POA: Insufficient documentation

## 2013-12-13 DIAGNOSIS — F10939 Alcohol use, unspecified with withdrawal, unspecified: Principal | ICD-10-CM | POA: Diagnosis present

## 2013-12-13 DIAGNOSIS — F1910 Other psychoactive substance abuse, uncomplicated: Secondary | ICD-10-CM | POA: Diagnosis present

## 2013-12-13 DIAGNOSIS — F319 Bipolar disorder, unspecified: Secondary | ICD-10-CM | POA: Insufficient documentation

## 2013-12-13 DIAGNOSIS — F10239 Alcohol dependence with withdrawal, unspecified: Principal | ICD-10-CM | POA: Diagnosis present

## 2013-12-13 DIAGNOSIS — F1210 Cannabis abuse, uncomplicated: Secondary | ICD-10-CM | POA: Insufficient documentation

## 2013-12-13 DIAGNOSIS — F1510 Other stimulant abuse, uncomplicated: Secondary | ICD-10-CM | POA: Insufficient documentation

## 2013-12-13 DIAGNOSIS — Z5900 Homelessness unspecified: Secondary | ICD-10-CM

## 2013-12-13 DIAGNOSIS — F1310 Sedative, hypnotic or anxiolytic abuse, uncomplicated: Secondary | ICD-10-CM | POA: Insufficient documentation

## 2013-12-13 DIAGNOSIS — F1920 Other psychoactive substance dependence, uncomplicated: Secondary | ICD-10-CM | POA: Diagnosis present

## 2013-12-13 DIAGNOSIS — Z79899 Other long term (current) drug therapy: Secondary | ICD-10-CM | POA: Insufficient documentation

## 2013-12-13 DIAGNOSIS — F1020 Alcohol dependence, uncomplicated: Secondary | ICD-10-CM | POA: Diagnosis present

## 2013-12-13 DIAGNOSIS — R45851 Suicidal ideations: Secondary | ICD-10-CM

## 2013-12-13 DIAGNOSIS — F332 Major depressive disorder, recurrent severe without psychotic features: Secondary | ICD-10-CM | POA: Diagnosis present

## 2013-12-13 DIAGNOSIS — F39 Unspecified mood [affective] disorder: Secondary | ICD-10-CM | POA: Diagnosis present

## 2013-12-13 DIAGNOSIS — G4700 Insomnia, unspecified: Secondary | ICD-10-CM | POA: Diagnosis present

## 2013-12-13 DIAGNOSIS — F410 Panic disorder [episodic paroxysmal anxiety] without agoraphobia: Secondary | ICD-10-CM | POA: Diagnosis present

## 2013-12-13 DIAGNOSIS — F909 Attention-deficit hyperactivity disorder, unspecified type: Secondary | ICD-10-CM | POA: Diagnosis present

## 2013-12-13 HISTORY — DX: Depression, unspecified: F32.A

## 2013-12-13 HISTORY — DX: Anxiety disorder, unspecified: F41.9

## 2013-12-13 HISTORY — DX: Major depressive disorder, single episode, unspecified: F32.9

## 2013-12-13 LAB — URINALYSIS, ROUTINE W REFLEX MICROSCOPIC
Bilirubin Urine: NEGATIVE
Glucose, UA: NEGATIVE mg/dL
HGB URINE DIPSTICK: NEGATIVE
Ketones, ur: NEGATIVE mg/dL
Leukocytes, UA: NEGATIVE
Nitrite: NEGATIVE
PH: 6.5 (ref 5.0–8.0)
Protein, ur: NEGATIVE mg/dL
SPECIFIC GRAVITY, URINE: 1.02 (ref 1.005–1.030)
UROBILINOGEN UA: 0.2 mg/dL (ref 0.0–1.0)

## 2013-12-13 LAB — HEPATIC FUNCTION PANEL
ALBUMIN: 4 g/dL (ref 3.5–5.2)
ALK PHOS: 112 U/L (ref 39–117)
ALT: 20 U/L (ref 0–53)
AST: 25 U/L (ref 0–37)
BILIRUBIN TOTAL: 0.4 mg/dL (ref 0.3–1.2)
Bilirubin, Direct: <0.2 mg/dL (ref 0.0–0.3)
Total Protein: 7.5 g/dL (ref 6.0–8.3)

## 2013-12-13 LAB — BASIC METABOLIC PANEL
BUN: 6 mg/dL (ref 6–23)
CO2: 24 mEq/L (ref 19–32)
Calcium: 9.2 mg/dL (ref 8.4–10.5)
Chloride: 103 mEq/L (ref 96–112)
Creatinine, Ser: 0.83 mg/dL (ref 0.50–1.35)
GFR calc Af Amer: >90 mL/min (ref >90)
GFR calc non Af Amer: >90 mL/min (ref >90)
GLUCOSE: 111 mg/dL — AB (ref 70–99)
Potassium: 3.6 mEq/L — ABNORMAL LOW (ref 3.7–5.3)
Sodium: 141 mEq/L (ref 137–147)

## 2013-12-13 LAB — CBC WITH DIFFERENTIAL/PLATELET
Basophils Absolute: 0.1 10*3/uL (ref 0.0–0.1)
Basophils Relative: 1 % (ref 0–1)
EOS ABS: 0.2 10*3/uL (ref 0.0–0.7)
Eosinophils Relative: 2 % (ref 0–5)
HCT: 44.9 % (ref 39.0–52.0)
Hemoglobin: 15.7 g/dL (ref 13.0–17.0)
Lymphocytes Relative: 21 % (ref 12–46)
Lymphs Abs: 2.1 10*3/uL (ref 0.7–4.0)
MCH: 34.2 pg — AB (ref 26.0–34.0)
MCHC: 35 g/dL (ref 30.0–36.0)
MCV: 97.8 fL (ref 78.0–100.0)
MONOS PCT: 7 % (ref 3–12)
Monocytes Absolute: 0.7 10*3/uL (ref 0.1–1.0)
NEUTROS PCT: 70 % (ref 43–77)
Neutro Abs: 7.1 10*3/uL (ref 1.7–7.7)
Platelets: 301 10*3/uL (ref 150–400)
RBC: 4.59 MIL/uL (ref 4.22–5.81)
RDW: 12.9 % (ref 11.5–15.5)
WBC: 10.2 10*3/uL (ref 4.0–10.5)

## 2013-12-13 LAB — RAPID URINE DRUG SCREEN, HOSP PERFORMED
Amphetamines: POSITIVE — AB
BENZODIAZEPINES: POSITIVE — AB
Barbiturates: NOT DETECTED
COCAINE: POSITIVE — AB
Opiates: NOT DETECTED
TETRAHYDROCANNABINOL: POSITIVE — AB

## 2013-12-13 LAB — ETHANOL: Alcohol, Ethyl (B): 33 mg/dL — ABNORMAL HIGH (ref 0–11)

## 2013-12-13 MED ORDER — CHLORDIAZEPOXIDE HCL 25 MG PO CAPS
25.0000 mg | ORAL_CAPSULE | Freq: Three times a day (TID) | ORAL | Status: DC
Start: 2013-12-15 — End: 2013-12-15

## 2013-12-13 MED ORDER — ACETAMINOPHEN 325 MG PO TABS: 650.0000 mg | ORAL_TABLET | ORAL | Status: DC | PRN

## 2013-12-13 MED ORDER — NICOTINE 21 MG/24HR TD PT24
21.0000 mg | MEDICATED_PATCH | Freq: Once | TRANSDERMAL | Status: DC
  Administered 2013-12-13: 21 mg via TRANSDERMAL

## 2013-12-13 MED ORDER — TRAZODONE HCL 50 MG PO TABS
50.0000 mg | ORAL_TABLET | Freq: Every evening | ORAL | Status: DC | PRN
  Administered 2013-12-14: 50 mg via ORAL
  Filled 2013-12-13: qty 1

## 2013-12-13 MED ORDER — CHLORDIAZEPOXIDE HCL 25 MG PO CAPS
25.0000 mg | ORAL_CAPSULE | Freq: Four times a day (QID) | ORAL | Status: AC
  Administered 2013-12-14 (×4): 25 mg via ORAL
  Filled 2013-12-13 (×4): qty 1

## 2013-12-13 MED ORDER — LOPERAMIDE HCL 2 MG PO CAPS
2.0000 mg | ORAL_CAPSULE | ORAL | Status: DC | PRN
Start: 2013-12-13 — End: 2013-12-15

## 2013-12-13 MED ORDER — VITAMIN B-1 100 MG PO TABS
100.0000 mg | ORAL_TABLET | Freq: Every day | ORAL | Status: DC
  Administered 2013-12-13: 100 mg via ORAL
  Filled 2013-12-13: qty 1

## 2013-12-13 MED ORDER — ALUM & MAG HYDROXIDE-SIMETH 200-200-20 MG/5ML PO SUSP: 30.0000 mL | ORAL | Status: DC | PRN

## 2013-12-13 MED ORDER — PANTOPRAZOLE SODIUM 40 MG PO TBEC
80.0000 mg | DELAYED_RELEASE_TABLET | Freq: Every day | ORAL | Status: DC
  Administered 2013-12-14 – 2013-12-17 (×4): 80 mg via ORAL
  Filled 2013-12-13 (×5): qty 2

## 2013-12-13 MED ORDER — LORAZEPAM 1 MG PO TABS
0.0000 mg | ORAL_TABLET | Freq: Four times a day (QID) | ORAL | Status: DC
  Administered 2013-12-13: 4 mg via ORAL
  Filled 2013-12-13: qty 4

## 2013-12-13 MED ORDER — LORAZEPAM 1 MG PO TABS: 0.0000 mg | ORAL_TABLET | Freq: Two times a day (BID) | ORAL | Status: DC

## 2013-12-13 MED ORDER — MIRTAZAPINE 30 MG PO TABS
30.0000 mg | ORAL_TABLET | Freq: Every day | ORAL | Status: DC
  Administered 2013-12-14 – 2013-12-16 (×4): 30 mg via ORAL
  Filled 2013-12-13 (×6): qty 1

## 2013-12-13 MED ORDER — NICOTINE 14 MG/24HR TD PT24: 14.0000 mg | MEDICATED_PATCH | Freq: Once | TRANSDERMAL | Status: DC

## 2013-12-13 MED ORDER — MAGNESIUM HYDROXIDE 400 MG/5ML PO SUSP: 30.0000 mL | Freq: Every day | ORAL | Status: DC | PRN

## 2013-12-13 MED ORDER — ACETAMINOPHEN 325 MG PO TABS: 650.0000 mg | ORAL_TABLET | Freq: Four times a day (QID) | ORAL | Status: DC | PRN

## 2013-12-13 MED ORDER — IBUPROFEN 400 MG PO TABS: 600.0000 mg | ORAL_TABLET | Freq: Three times a day (TID) | ORAL | Status: DC | PRN

## 2013-12-13 MED ORDER — MIRTAZAPINE 30 MG PO TABS: 30.0000 mg | ORAL_TABLET | Freq: Every day | ORAL | Status: DC

## 2013-12-13 MED ORDER — VITAMIN B-1 100 MG PO TABS
100.0000 mg | ORAL_TABLET | Freq: Every day | ORAL | Status: DC
  Administered 2013-12-14 – 2013-12-17 (×4): 100 mg via ORAL
  Filled 2013-12-13 (×5): qty 1

## 2013-12-13 MED ORDER — HYDROXYZINE HCL 25 MG PO TABS: 25.0000 mg | ORAL_TABLET | Freq: Four times a day (QID) | ORAL | Status: DC | PRN

## 2013-12-13 MED ORDER — CHLORDIAZEPOXIDE HCL 25 MG PO CAPS: 25.0000 mg | ORAL_CAPSULE | ORAL | Status: DC

## 2013-12-13 MED ORDER — CHLORDIAZEPOXIDE HCL 25 MG PO CAPS
25.0000 mg | ORAL_CAPSULE | Freq: Four times a day (QID) | ORAL | Status: DC | PRN
  Administered 2013-12-14: 25 mg via ORAL
  Filled 2013-12-13: qty 1

## 2013-12-13 MED ORDER — ADULT MULTIVITAMIN W/MINERALS CH
1.0000 | ORAL_TABLET | Freq: Every day | ORAL | Status: DC
  Administered 2013-12-14: 1 via ORAL
  Filled 2013-12-13 (×3): qty 1

## 2013-12-13 MED ORDER — THIAMINE HCL 100 MG/ML IJ SOLN
100.0000 mg | Freq: Every day | INTRAMUSCULAR | Status: DC
  Filled 2013-12-13: qty 2

## 2013-12-13 MED ORDER — ONDANSETRON HCL 4 MG PO TABS: 4.0000 mg | ORAL_TABLET | Freq: Three times a day (TID) | ORAL | Status: DC | PRN

## 2013-12-13 MED ORDER — NICOTINE 21 MG/24HR TD PT24
MEDICATED_PATCH | TRANSDERMAL | Status: AC
  Administered 2013-12-13: 21 mg via TRANSDERMAL
  Filled 2013-12-13: qty 1

## 2013-12-13 MED ORDER — ONDANSETRON 4 MG PO TBDP: 4.0000 mg | ORAL_TABLET | Freq: Four times a day (QID) | ORAL | Status: AC | PRN

## 2013-12-13 MED ORDER — DIVALPROEX SODIUM ER 250 MG PO TB24: 250.0000 mg | ORAL_TABLET | Freq: Three times a day (TID) | ORAL | Status: DC

## 2013-12-13 MED ORDER — PANTOPRAZOLE SODIUM 40 MG PO TBEC
40.0000 mg | DELAYED_RELEASE_TABLET | Freq: Every day | ORAL | Status: DC
  Administered 2013-12-13: 40 mg via ORAL
  Filled 2013-12-13: qty 1

## 2013-12-13 MED ORDER — CHLORDIAZEPOXIDE HCL 25 MG PO CAPS
50.0000 mg | ORAL_CAPSULE | Freq: Once | ORAL | Status: AC
  Administered 2013-12-14: 50 mg via ORAL
  Filled 2013-12-13: qty 2

## 2013-12-13 MED ORDER — THIAMINE HCL 100 MG/ML IJ SOLN
100.0000 mg | Freq: Once | INTRAMUSCULAR | Status: AC
Start: 2013-12-14 — End: 2013-12-14
  Administered 2013-12-14: 100 mg via INTRAMUSCULAR
  Filled 2013-12-13: qty 2

## 2013-12-13 MED ORDER — CHLORDIAZEPOXIDE HCL 25 MG PO CAPS: 25.0000 mg | ORAL_CAPSULE | Freq: Every day | ORAL | Status: DC

## 2013-12-13 NOTE — ED Notes (Signed)
Pt doing tele-psych eval

## 2013-12-13 NOTE — ED Notes (Signed)
Pt reports SI with plan to shoot himself. Reports cocaine, prescription med and EtOH addiction.  [facility]

## 2013-12-13 NOTE — ED Provider Notes (Signed)
CSN: [id number]     Arrival date & time 12/13/13  1308 History  This chart was scribed for Daniel Arroyo PayorPickering, MD by Quintella ReichertMatthew Arroyo, ED scribe.  This patient was seen in room APA17/APA17 and the patient's care was started at 2:03 PM.   Chief Complaint  Patient presents with  . V70.1    The history is provided by the patient. No language interpreter was used.    HPI Comments: Daniel Arroyo is a 43 y.o. male who presents to the Emergency Department complaining of SI and polysubstance abuse.  Pt states that over the past 2 weeks he has been very depressed and considering suicide by overdosing or finding a gun and shooting himself.  He attributes his suicide to his "inability to get help" with his substance abuse.  He states he has sought help in the past and he is upset that he is not receiving the help he needs.  He has prior h/o psychiatric hospitalization but denies prior suicide attempts.  He states he normally takes antidepressants but he stopped taking them 6 months ago.  Pt reports that he has been drinking at least 1 case of beer per day and occasionally higher-proof beer.  He has been using cocaine daily as well.  He has also been injecting cocaine and occasionally meth or "whatever the hell somebody had."  He last drank 30 minutes pta and used cocaine this morning.  He states he typically enters withdrawal the following day after he starts drinking.  Pt reports he was completely clean for 4 years but began drinking again last October when he lost his job.  He admits to h/o anxiety but denies h/o pancreas issues or any other physical chronic medical conditions to his knowledge.    Past Medical History  Diagnosis Date  . Knee pain   . Gastroesophageal reflux disease   . Polysubstance abuse   . Bipolar disorder   . Anxiety and depression   . ADHD, adult residual type   . Depression   . Anxiety     History reviewed. No pertinent past surgical history.  History reviewed. No  pertinent family history.   History  Substance Use Topics  . Smoking status: Current Every Day Smoker -- 1.00 packs/day  . Smokeless tobacco: Never Used  . Alcohol Use: Yes     Comment: daily     Review of Systems  Psychiatric/Behavioral: Positive for suicidal ideas and dysphoric mood.       Polysubstance abuse  All other systems reviewed and are negative.      Allergies  Review of patient's allergies indicates no known allergies.  Home Medications   Current Outpatient Rx  Name  Route  Sig  Dispense  Refill  . amphetamine-dextroamphetamine (ADDERALL XR) 20 MG 24 hr capsule   Oral   Take 1 capsule by mouth daily.         . chlordiazePOXIDE (LIBRIUM) 25 MG capsule   Oral   Take 25-50 mg by mouth 3 (three) times daily as needed for anxiety.         . divalproex (DEPAKOTE ER) 250 MG 24 hr tablet   Oral   Take 250 mg by mouth 3 (three) times daily.         . mirtazapine (REMERON) 30 MG tablet   Oral   Take 30 mg by mouth at bedtime.         Marland Kitchen. omeprazole (PRILOSEC) 40 MG capsule   Oral   Take  1 capsule by mouth daily.          BP 163/110  Pulse 96  Temp(Src) 98 F (36.7 C) (Oral)  Resp 18  Ht 6' (1.829 m)  Wt 155 lb (70.308 kg)  BMI 21.02 kg/m2  SpO2 97%  Physical Exam  Nursing note and vitals reviewed. Constitutional: He is oriented to person, place, and time. He appears well-developed and well-nourished. No distress.  HENT:  Head: Normocephalic and atraumatic.  Eyes: EOM are normal.  Neck: Neck supple. No tracheal deviation present.  Cardiovascular: Normal rate.   Pulmonary/Chest: Effort normal and breath sounds normal. No respiratory distress. He has no wheezes. He has no rales.  Musculoskeletal: Normal range of motion.  Neurological: He is alert and oriented to person, place, and time.  Skin: Skin is warm and dry.  Injection sites without abscess at left antecubital area    ED Course  Procedures (including critical care  time)  DIAGNOSTIC STUDIES: Oxygen Saturation is 97% on room air, normal by my interpretation.    COORDINATION OF CARE: 2:09 PM-Discussed treatment plan which includes labs with pt at bedside and pt agreed to plan.    Results for orders placed during the hospital encounter of 12/13/13  URINALYSIS, ROUTINE W REFLEX MICROSCOPIC      Result Value Ref Range   Color, Urine YELLOW  YELLOW   APPearance CLEAR  CLEAR   Specific Gravity, Urine 1.020  1.005 - 1.030   pH 6.5  5.0 - 8.0   Glucose, UA NEGATIVE  NEGATIVE mg/dL   Hgb urine dipstick NEGATIVE  NEGATIVE   Bilirubin Urine NEGATIVE  NEGATIVE   Ketones, ur NEGATIVE  NEGATIVE mg/dL   Protein, ur NEGATIVE  NEGATIVE mg/dL   Urobilinogen, UA 0.2  0.0 - 1.0 mg/dL   Nitrite NEGATIVE  NEGATIVE   Leukocytes, UA NEGATIVE  NEGATIVE  URINE RAPID DRUG SCREEN (HOSP PERFORMED)      Result Value Ref Range   Opiates NONE DETECTED  NONE DETECTED   Cocaine POSITIVE (*) NONE DETECTED   Benzodiazepines POSITIVE (*) NONE DETECTED   Amphetamines POSITIVE (*) NONE DETECTED   Tetrahydrocannabinol POSITIVE (*) NONE DETECTED   Barbiturates NONE DETECTED  NONE DETECTED  CBC WITH DIFFERENTIAL      Result Value Ref Range   WBC 10.2  4.0 - 10.5 K/uL   RBC 4.59  4.22 - 5.81 MIL/uL   Hemoglobin 15.7  13.0 - 17.0 g/dL   HCT 54.0  98.1 - 19.1 %   MCV 97.8  78.0 - 100.0 fL   MCH 34.2 (*) 26.0 - 34.0 pg   MCHC 35.0  30.0 - 36.0 g/dL   RDW 47.8  29.5 - 62.1 %   Platelets 301  150 - 400 K/uL   Neutrophils Relative % 70  43 - 77 %   Neutro Abs 7.1  1.7 - 7.7 K/uL   Lymphocytes Relative 21  12 - 46 %   Lymphs Abs 2.1  0.7 - 4.0 K/uL   Monocytes Relative 7  3 - 12 %   Monocytes Absolute 0.7  0.1 - 1.0 K/uL   Eosinophils Relative 2  0 - 5 %   Eosinophils Absolute 0.2  0.0 - 0.7 K/uL   Basophils Relative 1  0 - 1 %   Basophils Absolute 0.1  0.0 - 0.1 K/uL  ETHANOL      Result Value Ref Range   Alcohol, Ethyl (B) 33 (*) 0 - 11 mg/dL  BASIC METABOLIC PANEL  Result Value Ref Range   Sodium 141  137 - 147 mEq/L   Potassium 3.6 (*) 3.7 - 5.3 mEq/L   Chloride 103  96 - 112 mEq/L   CO2 24  19 - 32 mEq/L   Glucose, Bld 111 (*) 70 - 99 mg/dL   BUN 6  6 - 23 mg/dL   Creatinine, Ser 1.61  0.50 - 1.35 mg/dL   Calcium 9.2  8.4 - 09.6 mg/dL   GFR calc non Af Amer >90  >90 mL/min   GFR calc Af Amer >90  >90 mL/min  HEPATIC FUNCTION PANEL      Result Value Ref Range   Total Protein 7.5  6.0 - 8.3 g/dL   Albumin 4.0  3.5 - 5.2 g/dL   AST 25  0 - 37 U/L   ALT 20  0 - 53 U/L   Alkaline Phosphatase 112  39 - 117 U/L   Total Bilirubin 0.4  0.3 - 1.2 mg/dL   Bilirubin, Direct <0.4  0.0 - 0.3 mg/dL   Indirect Bilirubin NOT CALCULATED  0.3 - 0.9 mg/dL       MDM   Final diagnoses:  Polysubstance abuse  Suicidal ideations    Patient presents with polysubstance abuse and suicidal thoughts. He appears to medically cleared at this time. His been accepted at behavioral health by Dr. Dub Mikes.    I personally performed the services described in this documentation, which was scribed in my presence. The recorded information has been reviewed and is accurate.     Juliet Rude. Arroyo Payor, MD 12/13/13 2147

## 2013-12-13 NOTE — ED Notes (Signed)
Security took pt to shower
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
## 2013-12-13 NOTE — ED Notes (Signed)
Patient to shower with security.  

## 2013-12-14 ENCOUNTER — Encounter (HOSPITAL_COMMUNITY): Payer: Self-pay | Admitting: *Deleted

## 2013-12-14 DIAGNOSIS — F332 Major depressive disorder, recurrent severe without psychotic features: Secondary | ICD-10-CM | POA: Diagnosis present

## 2013-12-14 DIAGNOSIS — F39 Unspecified mood [affective] disorder: Secondary | ICD-10-CM | POA: Diagnosis present

## 2013-12-14 DIAGNOSIS — F1020 Alcohol dependence, uncomplicated: Secondary | ICD-10-CM | POA: Diagnosis present

## 2013-12-14 DIAGNOSIS — F909 Attention-deficit hyperactivity disorder, unspecified type: Secondary | ICD-10-CM | POA: Diagnosis present

## 2013-12-14 LAB — HEMOGLOBIN A1C
Hgb A1c MFr Bld: 4.9 % (ref <5.7)
Mean Plasma Glucose: 94 mg/dL (ref <117)

## 2013-12-14 LAB — VALPROIC ACID LEVEL: Valproic Acid Lvl: <10 ug/mL — ABNORMAL LOW (ref 50.0–100.0)

## 2013-12-14 MED ORDER — MAGNESIUM HYDROXIDE 400 MG/5ML PO SUSP: 30.0000 mL | Freq: Every day | ORAL | Status: DC | PRN

## 2013-12-14 MED ORDER — CHLORDIAZEPOXIDE HCL 25 MG PO CAPS
25.0000 mg | ORAL_CAPSULE | Freq: Three times a day (TID) | ORAL | Status: AC
  Administered 2013-12-15 – 2013-12-16 (×2): 25 mg via ORAL
  Filled 2013-12-14 (×3): qty 1

## 2013-12-14 MED ORDER — ADULT MULTIVITAMIN W/MINERALS CH
1.0000 | ORAL_TABLET | Freq: Every day | ORAL | Status: DC
  Administered 2013-12-15 – 2013-12-17 (×3): 1 via ORAL
  Filled 2013-12-14 (×4): qty 1

## 2013-12-14 MED ORDER — ONDANSETRON 4 MG PO TBDP: 4.0000 mg | ORAL_TABLET | Freq: Four times a day (QID) | ORAL | Status: DC | PRN

## 2013-12-14 MED ORDER — CHLORDIAZEPOXIDE HCL 25 MG PO CAPS
25.0000 mg | ORAL_CAPSULE | ORAL | Status: AC
  Administered 2013-12-16 – 2013-12-17 (×2): 25 mg via ORAL
  Filled 2013-12-14 (×2): qty 1

## 2013-12-14 MED ORDER — ENSURE COMPLETE PO LIQD
237.0000 mL | Freq: Three times a day (TID) | ORAL | Status: DC
  Administered 2013-12-14 – 2013-12-17 (×8): 237 mL via ORAL

## 2013-12-14 MED ORDER — ACETAMINOPHEN 325 MG PO TABS: 650.0000 mg | ORAL_TABLET | Freq: Four times a day (QID) | ORAL | Status: DC | PRN

## 2013-12-14 MED ORDER — CHLORDIAZEPOXIDE HCL 25 MG PO CAPS
25.0000 mg | ORAL_CAPSULE | Freq: Four times a day (QID) | ORAL | Status: AC
  Administered 2013-12-15 (×3): 25 mg via ORAL
  Filled 2013-12-14 (×3): qty 1

## 2013-12-14 MED ORDER — DIVALPROEX SODIUM ER 250 MG PO TB24
250.0000 mg | ORAL_TABLET | Freq: Three times a day (TID) | ORAL | Status: DC
  Administered 2013-12-14 – 2013-12-17 (×9): 250 mg via ORAL
  Filled 2013-12-14 (×12): qty 1

## 2013-12-14 MED ORDER — CHLORDIAZEPOXIDE HCL 25 MG PO CAPS: 25.0000 mg | ORAL_CAPSULE | Freq: Four times a day (QID) | ORAL | Status: DC | PRN

## 2013-12-14 MED ORDER — NICOTINE 21 MG/24HR TD PT24
21.0000 mg | MEDICATED_PATCH | Freq: Every day | TRANSDERMAL | Status: DC
  Administered 2013-12-14 – 2013-12-17 (×4): 21 mg via TRANSDERMAL
  Filled 2013-12-14 (×5): qty 1

## 2013-12-14 MED ORDER — CHLORDIAZEPOXIDE HCL 25 MG PO CAPS: 25.0000 mg | ORAL_CAPSULE | Freq: Every day | ORAL | Status: DC

## 2013-12-14 MED ORDER — ENSURE COMPLETE PO LIQD
237.0000 mL | Freq: Two times a day (BID) | ORAL | Status: DC
  Administered 2013-12-14: 237 mL via ORAL

## 2013-12-14 MED ORDER — HYDROXYZINE HCL 25 MG PO TABS
25.0000 mg | ORAL_TABLET | Freq: Four times a day (QID) | ORAL | Status: DC | PRN
  Administered 2013-12-15 – 2013-12-16 (×2): 25 mg via ORAL
  Filled 2013-12-14 (×2): qty 1

## 2013-12-14 MED ORDER — LOPERAMIDE HCL 2 MG PO CAPS: 2.0000 mg | ORAL_CAPSULE | ORAL | Status: DC | PRN

## 2013-12-14 NOTE — Progress Notes (Addendum)
NUTRITION ASSESSMENT  Pt identified as at risk on the Malnutrition Screen Tool/ and MD consult to assess patient.  INTERVENTION: 1. Educated patient on the importance of nutrition and encouraged intake of food and beverages. 2. Discussed weight goals. 3. Supplements: MVI and thiamine daily.  Ensure Complete po TID, each supplement provides 350 kcal and 13 grams of protein   NUTRITION DIAGNOSIS: Unintentional weight loss related to sub-optimal intake as evidenced by pt report.   Goal: Pt to meet >/= 90% of their estimated nutrition needs.  Monitor:  PO intake  Assessment:  Patient admitted for etoh detox, drug abuse (cocaine, THC, amphetamines, benzos, heroine) and SI.  Homeless.  Reports weight loss of almost 40 lbs over the past 3 months due to inability to purchase food.  Noted an 18 lb weight loss over the past month per e-chart.   Patient reports that he is "starving" and has been eating well since admit but "was not eating at all for the past 6-8 weeks and truly nothing for the past week prior to admit secondary to drugs, etoh and homeless.  UBW 190-195 6 months ago. And 170 lbs 1 month ago.  11% weight loss in the past month and 20% weight loss in the past 6 months due to drug and etoh abuse.  Noted glucose elevated at times.   Patient meets criteria for severe malnutrition related to social/environmental causes AEB weight loss above, <50% intake for > month and decreased muscle mass.     43 y.o. male  Height: Ht Readings from Last 1 Encounters:  12/14/13 5\' 10"  (1.778 m)    Weight: Wt Readings from Last 1 Encounters:  12/14/13 152 lb (68.947 kg)    Weight Hx: Wt Readings from Last 10 Encounters:  12/14/13 152 lb (68.947 kg)  12/13/13 155 lb (70.308 kg)  11/12/13 170 lb (77.111 kg)    BMI:  Body mass index is 21.81 kg/(m^2). Pt meets criteria for normal weight based on current BMI.  Estimated Nutritional Needs: Kcal: 25-30 kcal/kg Protein: > 1 gram  protein/kg Fluid: 1 ml/kcal  Diet Order: General Pt is also offered choice of unit snacks mid-morning and mid-afternoon.  Pt is eating as desired.   Lab results and medications reviewed.   Oran ReinLaura Jobe, RD, LDN Clinical Inpatient Dietitian Pager:  [phone] Weekend and after hours pager:  [phone]

## 2013-12-14 NOTE — Clinical Social Work Note (Signed)
Pt is having a hard time with detox and is asking to be left alone to sleep today. CSW unable to perform PSA at this time. [facility]. Pt stated in morning group that he only has SS card, no photo ID. CSW must provide letter stating that pt does not have photo ID per Trey PaulaJeff A. CSW must contact [facility] on Tuesday to verify there is bed available for Wednesday (talk to [place] A).  The Sherwin-WilliamsHeather Smart, LCSWA 12/14/2013 3:00 PM

## 2013-12-14 NOTE — [facility] Suicide Risk Assessment (Signed)
Suicide Risk Assessment  Admission Assessment     Nursing information obtained from:  Patient;Review of record Demographic factors:  Male;Divorced or widowed;Caucasian;Low socioeconomic status;Living alone;Unemployed;Access to firearms Current Mental Status:  Suicidal ideation indicated by patient;Suicide plan;Plan includes specific time, place, or method;Self-harm thoughts;Intention to act on suicide plan;Belief that plan would result in death Loss Factors:  Decrease in vocational status;Financial problems / change in socioeconomic status Historical Factors:  Family history of mental illness or substance abuse Risk Reduction Factors:  Responsible for children under 45 years of age;Positive social support Total Time spent with patient: 1 hour  CLINICAL FACTORS:   Depression:   Comorbid alcohol abuse/dependence Impulsivity Insomnia Alcohol/Substance Abuse/Dependencies COGNITIVE FEATURES THAT CONTRIBUTE TO RISK:  Closed-mindedness Polarized thinking Thought constriction (tunnel vision)    SUICIDE RISK:   Moderate:  Frequent suicidal ideation with limited intensity, and duration, some specificity in terms of plans, no associated intent, good self-control, limited dysphoria/symptomatology, some risk factors present, and identifiable protective factors, including available and accessible social support.  PLAN OF CARE: Supportive approach/coping skills/relapse prevention                               Librium detox/reassess and address the co morbidities  I certify that inpatient services furnished can reasonably be expected to improve the patient's condition.  Hula Tasso A 12/14/2013, 4:42 PM

## 2013-12-14 NOTE — [facility] Group Notes (Signed)
[facility]   12/14/2013 10:03 AM  Participation Quality:  Appropriate   Mood/Affect:  Depressed and Flat  Depression Rating:  7  Anxiety Rating:  9  Thoughts of Suicide:  Yes Will you contract for safety?   Yes  Current AVH:  No  Plan for Discharge/Comments:  Pt reports that [place] been homeless [place] and has been staying with some friends lately. Pt reports that he was clean 4 1/2 years until Oct 2014. Pt [facility] but has no photo ID. He has ss Card. CSW assessing. Pt reports he had not been following up with any providers for med management or therapy.   Transportation Means: bus  Supports: none identified.   Smart, American FinancialHeather LCSWA

## 2013-12-14 NOTE — Progress Notes (Signed)
D) Pt rates his depression at a 5 and his hopelessness at a 6. Also states he is feeling suicidal without a plan.States he is really having a hard time detoxing and was given an extra Librium 25 mg at 1445 today. Affect and mood are depressed. Pt feels overwhelmed due to his detoxing and states he would just like to sleep. A) Pt given support, reassurance along with praise and encouragement. Verbal contract made with Pt for his safety while on the unit. R) Pt contracts for his safety. Will continue to monitor Pt.

## 2013-12-14 NOTE — Tx Team (Signed)
Initial Interdisciplinary Treatment Plan  PATIENT STRENGTHS: (choose at least two) Ability for insight Active sense of humor Average or above average intelligence Communication skills General fund of knowledge Motivation for treatment/growth Physical Health Supportive family/friends  PATIENT STRESSORS: Financial difficulties Marital or family conflict Medication change or noncompliance Occupational concerns Substance abuse   PROBLEM LIST: Problem List/Patient Goals Date to be addressed Date deferred Reason deferred Estimated date of resolution                                                         DISCHARGE CRITERIA:  Adequate post-discharge living arrangements Improved stabilization in mood, thinking, and/or behavior Need for constant or close observation no longer present Reduction of life-threatening or endangering symptoms to within safe limits Verbal commitment to aftercare and medication compliance Withdrawal symptoms are absent or subacute and managed without 24-hour nursing intervention  PRELIMINARY DISCHARGE PLAN: Attend aftercare/continuing care group Attend 12-step recovery group Placement in alternative living arrangements  PATIENT/FAMIILY INVOLVEMENT: This treatment plan has been presented to and reviewed with the patient, Daniel Arroyo, and/or family member.  The patient and family have been given the opportunity to ask questions and make suggestions.  Lawrence MarseillesFriedman, Sukhmani Fetherolf Eakes 12/14/2013, 1:20 AM

## 2013-12-14 NOTE — Progress Notes (Signed)
Adult Psychoeducational Group Note  Date:  12/14/2013 Time:  11:45 AM  Group Topic/Focus:  Relapse Prevention Planning:   The focus of this group is to define relapse and discuss the need for planning to combat relapse.  Participation Level:  Active  Participation Quality:  Appropriate, Sharing and Supportive  Affect:  Appropriate  Cognitive:  Alert and Appropriate  Insight: Appropriate  Engagement in Group:  Engaged and Supportive  Modes of Intervention:  Support  Additional Comments:    Lauralee Evenerowlin, Geri Hepler Jvette 12/14/2013, 11:45 AM

## 2013-12-14 NOTE — Tx Team (Signed)
Interdisciplinary Treatment Plan Update (Adult)  Date: 12/14/2013   Time Reviewed: 11:40 AM  Progress in Treatment:  Attending groups: Yes  Participating in groups:  Yes  Taking medication as prescribed: Yes  Tolerating medication: Yes  Family/Significant othe contact made: Not yet.SPE required for this pt.   Patient understands diagnosis: Yes, AEB seeking treatment for ETOH detox, polysubstance abuse, mood stabilization, SI with plan, and medication management.  Discussing patient identified problems/goals with staff: Yes  Medical problems stabilized or resolved: Yes  Denies suicidal/homicidal ideation: Yes  Patient has not harmed self or Others: Yes  New problem(s) identified:  Discharge Plan or Barriers: pt [facility]. He has no photo Id, but has SS card and reports that [place]. Pt reports that his family lives in [place], which is the address on pt facesheet.  Additional comments: Pt vol admitted via [facility] after receiving med clearance. [facility] however was d/c from ED at that time. Pt returned to the ED on 3/26 this time with request for detox and SI to shoot self or OD. States he has access to firearms. Pt states he has been drinking a case or more of beer a day as well as using "any drug I can get my hands on." Pt + for cocaine, THC, amphetamines and benzos. Pt also states he has been shooting heroin though UDS-. CIWA at this time is a "7" with elevated BP. No hx of DTs, detox seizures. Pt has been noncompliant with psych meds. He is currently homeless, unemployed and experiencing financial stress. Pt reports loss of almost 40lbs over the last 3 months due to inability to purchase food. 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 Pt oriented to unit, searched and paper work completed. Provided meal and fluids.  Medicated per orders. Lengthy time spent with patient as he is slow to process information. He is able to contract for safety on the unit. No HI/AVH and remains safe resting in room. Lawrence MarseillesFriedman, Marian Eakes  Reason for Continuation of Hospitalization: Librium taper-withdrawals Mood stabilization Medication management Estimated length of stay: 3-5 days  For review of initial/current patient goals, please see plan of care.  Attendees:  Patient:    Family:    Physician: Geoffery LyonsIrving Lugo MD 12/14/2013 11:40 AM   Nursing: Thayer Ohmhris RN 12/14/2013 11:40 AM   Clinical Social Worker Batool Majid Smart, LCSWA  12/14/2013 11:40 AM   Other: Darden DatesJennifer C. Nurse CM 12/14/2013 11:40 AM   Other:    Other: Massie Kluverelores Sutton, Community Care Coordinator  12/14/2013 11:40 AM   Other:    Scribe for Treatment Team:  The Sherwin-WilliamsHeather Smart LCSWA 12/14/2013 11:40 AM
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
## 2013-12-14 NOTE — [facility] Group Notes (Signed)
[facility] LCSW Group Therapy  12/14/2013 2:57 PM  Type of Therapy:  Group Therapy  Participation Level:  Did Not Attend-pt sleeping in room/did not attend afternoon therapy group.   Smart, Jordany Russett LCSWA  12/14/2013, 2:57 PM

## 2013-12-14 NOTE — Progress Notes (Signed)
Pt vol admitted via [facility] after receiving med clearance. [facility] detox however was d/c from ED at that time. Pt returned to the ED on 3/26 this time with request for detox and SI to shoot self or OD. States he has access to firearms. Pt states he has been drinking a case or more of beer a day as well as using "any drug I can get my hands on." Pt + for cocaine, THC, amphetamines and benzos. Pt also states he has been shooting heroin though UDS-. CIWA at this time is a "7" with elevated BP. No hx of DTs, detox seizures. Pt has been noncompliant with psych meds. He is currently homeless, unemployed and experiencing financial stress. Pt reports loss of almost 40lbs over the last 3 months due to inability to purchase food. 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 Pt oriented to unit, searched and paper work completed. Provided meal and fluids. Medicated per orders. Lengthy time spent with patient as he is slow to process information. He is able to contract for safety on the unit. No HI/AVH and remains safe resting in room. Daniel Arroyo, Daniel Arroyo

## 2013-12-15 DIAGNOSIS — F329 Major depressive disorder, single episode, unspecified: Secondary | ICD-10-CM

## 2013-12-15 DIAGNOSIS — F1910 Other psychoactive substance abuse, uncomplicated: Secondary | ICD-10-CM

## 2013-12-15 DIAGNOSIS — F411 Generalized anxiety disorder: Secondary | ICD-10-CM

## 2013-12-15 DIAGNOSIS — F3289 Other specified depressive episodes: Secondary | ICD-10-CM

## 2013-12-15 DIAGNOSIS — F1010 Alcohol abuse, uncomplicated: Secondary | ICD-10-CM

## 2013-12-15 LAB — TSH: TSH: 2.063 u[IU]/mL (ref 0.350–4.500)

## 2013-12-15 MED ORDER — TRAZODONE HCL 100 MG PO TABS
100.0000 mg | ORAL_TABLET | Freq: Every evening | ORAL | Status: DC | PRN
  Administered 2013-12-15 – 2013-12-16 (×2): 100 mg via ORAL
  Filled 2013-12-15 (×2): qty 1

## 2013-12-15 MED ORDER — GABAPENTIN 100 MG PO CAPS
100.0000 mg | ORAL_CAPSULE | Freq: Three times a day (TID) | ORAL | Status: DC
  Administered 2013-12-15 – 2013-12-17 (×6): 100 mg via ORAL
  Filled 2013-12-15 (×9): qty 1

## 2013-12-15 NOTE — [facility] Group Notes (Signed)
[facility] LCSW Group Therapy  12/15/2013 12:34 PM  Type of Therapy:  Group Therapy  Participation Level:  Active  Participation Quality:  Attentive, Sharing and Supportive  Affect:  Blunted  Cognitive:  Alert and Oriented  Insight:  Developing/Improving  Engagement in Therapy:  Engaged  Modes of Intervention:  Discussion, Exploration and Support  Summary of Progress/Problems:  Today's group consisted of a conversation around Supportive Framework: What is a supportive framework? What does it look like feel like and how do I discern it from and unhealthy non-supportive network? Learn how to cope when supports are not helpful and don't support you. Discuss what to do when your family/friends are not supportive.  Daniel Arroyo was present and engaged in group discussion as other processed and he remained closed to give any personal experiences.  He shares how people truly do not care how you are doing or what is going on with your life, because of their own issues.  Daniel Arroyo was able to give examples of the common, hello how are you doing and moving on without getting more information.  In this instance, Daniel Arroyo reports he feels like he is burdening people or they don't want to help, thus he does not ask for help.  Daniel Arroyo reports to start finding a positive support you have to put yourself out there and call your sponsor and check in when things are going good rather than always in crisis.   Daniel Arroyo, Daniel Arroyo N 12/15/2013, 12:34 PM

## 2013-12-15 NOTE — [facility] Group Notes (Signed)
[facility] Group Notes:  (Nursing/MHT/Case Management/Adjunct)  Date:  12/15/2013  Time:  10:25 AM  Type of Therapy:  Psychoeducational Skills  Participation Level:  Did Not Attend  Buford DresserForrest, Ciara Kagan Shanta 12/15/2013, 10:25 AM

## 2013-12-15 NOTE — Progress Notes (Signed)
Patient ID: Daniel EconomyRonnie W Arroyo, male   DOB: 04-07-1971, 43 y.o.   MRN: [id number]  D: Pt has been very flat and depressed on the unit today, he has been very isolative and has been in the bed all day. Pt did not attend any groups and has not engaged in treatment. Pt reported being negative SI/HI, no AH/VH noted. A: 15 min checks continued for patient safety. R: Pt safety maintained.

## 2013-12-15 NOTE — Progress Notes (Signed)
[facility]  12/15/2013 10:25 AM Jule EconomyRonnie W Frisina  MRN:  [id number] Subjective:  Patient's sleep initiation was good but sleep maintenance was poor--Trazodone increased from 50 to 100 mg at bedtime.  Pinkney complains of anxiety and shaking from withdrawal from alcohol--encouraged him to take his PRN medications.  Appetite is good and "eating everything in sight."  [name] states situational depression due to "where I am again, same story."  He started drinking again in October and lost his job in February then his apartment.  [name] has been living with different friends.  His biggest concern is getting to the unemployment office Tuesday am to get his unemployment started but needs an ID.  Ayansh [facility] Tuesday meeting.  Then, [facility].  Diagnosis:   DSM5:  Substance/Addictive Disorders:  Alcohol Related Disorder - Severe (303.90), Alcohol Withdrawal (291.81) and Opioid Disorder - Moderate (304.00) Depressive Disorders:  Depression NOS Total Time spent with patient: 20 minutes  Axis I: Alcohol Abuse, Anxiety Disorder NOS, Depressive Disorder NOS and Substance Abuse Axis II: Deferred Axis III:  Past Medical History  Diagnosis Date  . Knee pain   . Gastroesophageal reflux disease   . Polysubstance abuse   . Bipolar disorder   . Anxiety and depression   . ADHD, adult residual type   . Depression   . Anxiety    Axis IV: other psychosocial or environmental problems, problems related to social environment and problems with primary support group Axis V: 41-50 serious symptoms  ADL's:  Intact  Sleep: Fair  Appetite:  Good  Suicidal Ideation:  Denies Homicidal Ideation:  Denies  Psychiatric Specialty Exam: Physical Exam  Constitutional: He is oriented to person, place, and time. He appears well-developed and well-nourished.  HENT:  Head: Normocephalic and atraumatic.  Neck: Normal  range of motion.  Respiratory: Effort normal.  GI: Soft.  Musculoskeletal: Normal range of motion.  Neurological: He is alert and oriented to person, place, and time.  Skin: Skin is warm and dry.    Review of Systems  Constitutional: Negative.   HENT: Negative.   Eyes: Negative.   Respiratory: Negative.   Cardiovascular: Negative.   Gastrointestinal: Negative.   Genitourinary: Negative.   Musculoskeletal: Negative.   Skin: Negative.   Neurological: Negative.   Endo/Heme/Allergies: Negative.   Psychiatric/Behavioral: Positive for depression and substance abuse. The patient is nervous/anxious.     Blood pressure 126/86, pulse 87, temperature 97.5 F (36.4 C), temperature source Oral, resp. rate 18, height 5\' 10"  (1.778 m), weight 152 lb (68.947 kg).Body mass index is 21.81 kg/(m^2).  General Appearance: Disheveled  Eye SolicitorContact::  Fair  Speech:  Normal Rate  Volume:  Normal  Mood:  Anxious and Depressed  Affect:  Congruent  Thought Process:  Coherent  Orientation:  Full (Time, Place, and Person)  Thought Content:  WDL  Suicidal Thoughts:  No  Homicidal Thoughts:  No  Memory:  Immediate;   Fair Recent;   Fair Remote;   Fair  Judgement:  Fair  Insight:  Fair  Psychomotor Activity:  Normal  Concentration:  Fair  Recall:  [employer]Fair  Fund of Knowledge:Fair  Language: Good  Akathisia:  No  Handed:  Right  AIMS (if indicated):     Assets:  Leisure Time Physical Health Resilience Social Support  Sleep:  Number of Hours: 6.5   Musculoskeletal: Strength & Muscle  Tone: within normal limits Gait & Station: normal Patient leans: N/A  Current Medications: Current Facility-Administered Medications  Medication Dose Route Frequency Provider Last Rate Last Dose  . acetaminophen (TYLENOL) tablet 650 mg  650 mg Oral Q6H PRN Kristeen Mans, NP      . alum & mag hydroxide-simeth (MAALOX/MYLANTA) 200-200-20 MG/5ML suspension 30 mL  30 mL Oral Q4H PRN Kristeen Mans, NP      .  chlordiazePOXIDE (LIBRIUM) capsule 25 mg  25 mg Oral QID Nanine Means, NP   25 mg at 12/15/13 0840   Followed by  . chlordiazePOXIDE (LIBRIUM) capsule 25 mg  25 mg Oral TID Nanine Means, NP       Followed by  . [START ON 12/16/2013] chlordiazePOXIDE (LIBRIUM) capsule 25 mg  25 mg Oral BH-qamhs Nanine Means, NP       Followed by  . [START ON 12/18/2013] chlordiazePOXIDE (LIBRIUM) capsule 25 mg  25 mg Oral Daily Nanine Means, NP      . divalproex (DEPAKOTE ER) 24 hr tablet 250 mg  250 mg Oral TID Rachael Fee, MD   250 mg at 12/15/13 0840  . feeding supplement (ENSURE COMPLETE) (ENSURE COMPLETE) liquid 237 mL  237 mL Oral TID BM Jeoffrey Massed, RD   237 mL at 12/15/13 0841  . hydrOXYzine (ATARAX/VISTARIL) tablet 25 mg  25 mg Oral Q6H PRN Nanine Means, NP      . loperamide (IMODIUM) capsule 2-4 mg  2-4 mg Oral PRN Nanine Means, NP      . magnesium hydroxide (MILK OF MAGNESIA) suspension 30 mL  30 mL Oral Daily PRN Kristeen Mans, NP      . mirtazapine (REMERON) tablet 30 mg  30 mg Oral QHS Kristeen Mans, NP   30 mg at 12/14/13 2134  . multivitamin with minerals tablet 1 tablet  1 tablet Oral Daily Nanine Means, NP   1 tablet at 12/15/13 0840  . nicotine (NICODERM CQ - dosed in mg/24 hours) patch 21 mg  21 mg Transdermal Daily Rachael Fee, MD   21 mg at 12/15/13 0840  . ondansetron (ZOFRAN-ODT) disintegrating tablet 4 mg  4 mg Oral Q6H PRN Nanine Means, NP      . pantoprazole (PROTONIX) EC tablet 80 mg  80 mg Oral Daily Kristeen Mans, NP   80 mg at 12/15/13 0840  . thiamine (VITAMIN B-1) tablet 100 mg  100 mg Oral Daily Kristeen Mans, NP   100 mg at 12/15/13 0840  . traZODone (DESYREL) tablet 50 mg  50 mg Oral QHS PRN Kristeen Mans, NP   50 mg at 12/14/13 2258    Lab Results:  Results for orders placed during the hospital encounter of 12/13/13 (from the past 48 hour(s))  VALPROIC ACID LEVEL     Status: Abnormal   Collection Time    12/14/13  6:23 AM      Result Value Ref Range   Valproic Acid  Lvl <10.0 (*) 50.0 - 100.0 ug/mL   Comment: Performed at [facility]  HEMOGLOBIN A1C     Status: None   Collection Time    12/14/13  7:44 PM      Result Value Ref Range   Hemoglobin A1C 4.9  <5.7 %   Comment: (NOTE)  According to the ADA Clinical Practice Recommendations for 2011, when     HbA1c is used as a screening test:      >=6.5%   Diagnostic of Diabetes Mellitus               (if abnormal result is confirmed)     5.7-6.4%   Increased risk of developing Diabetes Mellitus     References:Diagnosis and Classification of Diabetes Mellitus,Diabetes     Care,2011,34(Suppl 1):S62-S69 and Standards of Medical Care in             Diabetes - 2011,Diabetes Care,2011,34 (Suppl 1):S11-S61.   Mean Plasma Glucose 94  <117 mg/dL   Comment: Performed at [facility]  TSH     Status: None   Collection Time    12/14/13  7:44 PM      Result Value Ref Range   TSH 2.063  0.350 - 4.500 uIU/mL   Comment: Performed at [facility]    Physical Findings: AIMS: Facial and Oral Movements Muscles of Facial Expression: None, normal Lips and Perioral Area: None, normal Jaw: None, normal Tongue: None, normal,Extremity Movements Upper (arms, wrists, hands, fingers): None, normal Lower (legs, knees, ankles, toes): None, normal, Trunk Movements Neck, shoulders, hips: None, normal, Overall Severity Severity of abnormal movements (highest score from questions above): None, normal Incapacitation due to abnormal movements: None, normal Patient's awareness of abnormal movements (rate only patient's report): No Awareness, Dental Status Current problems with teeth and/or dentures?: No Does patient usually wear dentures?: No  CIWA:  CIWA-Ar Total: 0 COWS:     Treatment Plan Summary: Daily contact with patient to assess and evaluate symptoms and progress in treatment Medication management  Plan:  Review of  chart, vital signs, medications, and notes. 1-Individual and group therapy 2-Medication management for depression and anxiety:  Medications reviewed with the patient and he stated no untoward effects, no changes made---Librium alcohol detox protocol in place, Trazodone increased to 100 mg at bedtime for sleep, and Gabapentin 100 mg TID for anxiety/agitation during detox started 3-Coping skills for depression, anxiety, and alcohol abuse 4-Continue crisis stabilization and management 5-Address health issues--monitoring vital signs, stable 6-Treatment plan in progress to prevent relapse of depression, anxiety, and alcohol use  Medical Decision Making Problem Points:  Established problem, stable/improving (1) and Review of psycho-social stressors (1) Data Points:  Review of medication regiment & side effects (2)  I certify that inpatient services furnished can reasonably be expected to improve the patient's condition.   Nanine Means, PMH-NP 12/15/2013, 10:25 AM  I agreed with the findings, treatment and disposition plan of this patient. Kathryne Sharper, MD
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
## 2013-12-15 NOTE — Progress Notes (Signed)
D. Pt pleasant on approach, denies SI/HI/hallucinations at this time.  Interacting appropriately with peers on unit.  A.  Support and encouragement offered  R.  Pt remains safe on unit, will continue to monitor.

## 2013-12-15 NOTE — Progress Notes (Signed)
Patient ID: Daniel Arroyo, male   DOB: [birth year], 43 y.o.   MRN: [id number] D)   Has been in the dayroom this evening, watching tv, attended group for AA.  Was able to speak about his previous experience and going to [employer] meetings, here to get his life straight again.  Was on the phone for a short time,  Came to med window afterward for hs meds, asked about his weight on admission, stated had lost nearly 40 pounds.  Had refused Ensure earlier, agreed to have it at hs. A)  Will continue to monitor for safety, continue POC, support R)  Safety maintained.

## 2013-12-15 NOTE — Progress Notes (Signed)
Pt did not attend the evening group. 

## 2013-12-15 NOTE — B[facility] Group Notes (Signed)
[facility] Group Notes:  (Nursing/MHT/Case Management/Adjunct)  Date:  12/15/2013  Time:  2:44 PM  Type of Therapy:  Psychoeducational Skills  Participation Level:  Active  Participation Quality:  Appropriate  Affect:  Appropriate  Cognitive:  Appropriate  Insight:  Appropriate  Engagement in Group:  Engaged  Modes of Intervention:  Discussion  Summary of Progress/Problems: Pt did attend healthy coping skills group, and also watched Sober Life video that focused on behavior.  Pt reported that he was clean for 4 1/2 years, and is looking forward to staying clean.     Jacquelyne BalintForrest, Kodee Drury Shanta 12/15/2013, 2:44 PM

## 2013-12-16 NOTE — Progress Notes (Signed)
Patient did attend the evening speaker AA meeting.  

## 2013-12-16 NOTE — Progress Notes (Signed)
Patient ID: Daniel Arroyo, male   DOB: 02/07/1971, 43 y.o.   MRN: [id number] [facility] MD Progress Note  12/16/2013 10:13 PM Daniel Arroyo  MRN:  [id number] Subjective: Daniel Arroyo states he is doing well and is anticipating being discharged tomorrow. He is planning to follow up at [facility] for residential treatment on Wednesday. He has no new complaints, states he is tolerating the medication well and has no side effects.  Diagnosis:   DSM5:  Substance/Addictive Disorders:  Alcohol Related Disorder - Severe (303.90), Alcohol Withdrawal (291.81) and Opioid Disorder - Moderate (304.00) Depressive Disorders:  Depression NOS Total Time spent with patient: 20 minutes  Axis I: Alcohol Abuse, Anxiety Disorder NOS, Depressive Disorder NOS and Substance Abuse Axis II: Deferred Axis III:  Past Medical History  Diagnosis Date  . Knee pain   . Gastroesophageal reflux disease   . Polysubstance abuse   . Bipolar disorder   . Anxiety and depression   . ADHD, adult residual type   . Depression   . Anxiety    Axis IV: other psychosocial or environmental problems, problems related to social environment and problems with primary support group Axis V: 41-50 serious symptoms  ADL's:  Intact  Sleep: Fair  Appetite:  Good  Suicidal Ideation:  Denies Homicidal Ideation:  Denies  Psychiatric Specialty Exam: Physical Exam  Constitutional: He is oriented to person, place, and time. He appears well-developed and well-nourished.  HENT:  Head: Normocephalic and atraumatic.  Neck: Normal range of motion.  Respiratory: Effort normal.  GI: Soft.  Musculoskeletal: Normal range of motion.  Neurological: He is alert and oriented to person, place, and time.  Skin: Skin is warm and dry.    Review of Systems  Constitutional: Negative.   HENT: Negative.   Eyes: Negative.   Respiratory: Negative.   Cardiovascular: Negative.   Gastrointestinal: Negative.   Genitourinary: Negative.    Musculoskeletal: Negative.   Skin: Negative.   Neurological: Negative.   Endo/Heme/Allergies: Negative.   Psychiatric/Behavioral: Positive for depression and substance abuse. The patient is nervous/anxious.     Blood pressure 127/84, pulse 98, temperature 97.6 F (36.4 C), temperature source Oral, resp. rate 18, height 5\' 10"  (1.778 m), weight 68.947 kg (152 lb).Body mass index is 21.81 kg/(m^2).  General Appearance: Disheveled  Eye Solicitor::  Fair  Speech:  Normal Rate  Volume:  Normal  Mood:  Anxious and Depressed  Affect:  Congruent  Thought Process:  Coherent  Orientation:  Full (Time, Place, and Person)  Thought Content:  WDL  Suicidal Thoughts:  No  Homicidal Thoughts:  No  Memory:  Immediate;   Fair Recent;   Fair Remote;   Fair  Judgement:  Fair  Insight:  Fair  Psychomotor Activity:  Normal  Concentration:  Fair  Recall:  [employer] of Knowledge:Fair  Language: Good  Akathisia:  No  Handed:  Right  AIMS (if indicated):     Assets:  Leisure Time Physical Health Resilience Social Support  Sleep:  Number of Hours: 6   Musculoskeletal: Strength & Muscle Tone: within normal limits Gait & Station: normal Patient leans: N/A  Current Medications: Current Facility-Administered Medications  Medication Dose Route Frequency Provider Last Rate Last Dose  . acetaminophen (TYLENOL) tablet 650 mg  650 mg Oral Q6H PRN Kristeen Mans, NP      . alum & mag hydroxide-simeth (MAALOX/MYLANTA) 200-200-20 MG/5ML suspension 30 mL  30 mL Oral Q4H PRN Kristeen Mans, NP      .  chlordiazePOXIDE (LIBRIUM) capsule 25 mg  25 mg Oral BH-qamhs Nanine MeansJamison Lord, NP   25 mg at 12/16/13 2159   Followed by  . [START ON 12/18/2013] chlordiazePOXIDE (LIBRIUM) capsule 25 mg  25 mg Oral Daily Nanine MeansJamison Lord, NP      . divalproex (DEPAKOTE ER) 24 hr tablet 250 mg  250 mg Oral TID Rachael FeeIrving A Lugo, MD   250 mg at 12/16/13 1737  . feeding supplement (ENSURE COMPLETE) (ENSURE COMPLETE) liquid 237 mL  237 mL  Oral TID BM Jeoffrey MassedLaura Lee Jobe, RD   237 mL at 12/16/13 2158  . gabapentin (NEURONTIN) capsule 100 mg  100 mg Oral TID Nanine MeansJamison Lord, NP   100 mg at 12/16/13 1737  . hydrOXYzine (ATARAX/VISTARIL) tablet 25 mg  25 mg Oral Q6H PRN Nanine MeansJamison Lord, NP   25 mg at 12/16/13 2158  . loperamide (IMODIUM) capsule 2-4 mg  2-4 mg Oral PRN Nanine MeansJamison Lord, NP      . magnesium hydroxide (MILK OF MAGNESIA) suspension 30 mL  30 mL Oral Daily PRN Kristeen MansFran E Hobson, NP      . mirtazapine (REMERON) tablet 30 mg  30 mg Oral QHS Kristeen MansFran E Hobson, NP   30 mg at 12/16/13 2158  . multivitamin with minerals tablet 1 tablet  1 tablet Oral Daily Nanine MeansJamison Lord, NP   1 tablet at 12/16/13 0810  . nicotine (NICODERM CQ - dosed in mg/24 hours) patch 21 mg  21 mg Transdermal Daily Rachael FeeIrving A Lugo, MD   21 mg at 12/16/13 0810  . ondansetron (ZOFRAN-ODT) disintegrating tablet 4 mg  4 mg Oral Q6H PRN Nanine MeansJamison Lord, NP      . pantoprazole (PROTONIX) EC tablet 80 mg  80 mg Oral Daily Kristeen MansFran E Hobson, NP   80 mg at 12/16/13 0809  . thiamine (VITAMIN B-1) tablet 100 mg  100 mg Oral Daily Kristeen MansFran E Hobson, NP   100 mg at 12/16/13 0809  . traZODone (DESYREL) tablet 100 mg  100 mg Oral QHS PRN Nanine MeansJamison Lord, NP   100 mg at 12/16/13 2158    Lab Results:  No results found for this or any previous visit (from the past 48 hour(s)).  Physical Findings: AIMS: Facial and Oral Movements Muscles of Facial Expression: None, normal Lips and Perioral Area: None, normal Jaw: None, normal Tongue: None, normal,Extremity Movements Upper (arms, wrists, hands, fingers): None, normal Lower (legs, knees, ankles, toes): None, normal, Trunk Movements Neck, shoulders, hips: None, normal, Overall Severity Severity of abnormal movements (highest score from questions above): None, normal Incapacitation due to abnormal movements: None, normal Patient's awareness of abnormal movements (rate only patient's report): No Awareness, Dental Status Current problems with teeth and/or  dentures?: No Does patient usually wear dentures?: No  CIWA:  CIWA-Ar Total: 1 COWS:     Treatment Plan Summary: Daily contact with patient to assess and evaluate symptoms and progress in treatment Medication management  Plan:  Review of chart, vital signs, medications, and notes. 1. Continue plan of care as written with plans to d/c in AM if no further problems. Medical Decision Making Problem Points:  Established problem, stable/improving (1) and Review of psycho-social stressors (1) Data Points:  Review of medication regiment & side effects (2)  I certify that inpatient services furnished can reasonably be expected to improve the patient's condition.  Rona RavensNeil T. [facility] 12/16/2013 10:33 PM  I agreed with the findings, treatment and disposition plan of this patient. Kathryne SharperSyed Arfeen, MD

## 2013-12-16 NOTE — BHH Group Notes (Signed)
[facility] Group Notes:  (Nursing/MHT/Case Management/Adjunct)  Date:  12/16/2013  Time:  10:08 AM  Type of Therapy:  Psychoeducational Skills  Participation Level:  Did Not Attend  Buford DresserForrest, Bari Leib Shanta 12/16/2013, 10:08 AM

## 2013-12-16 NOTE — BHH Group Notes (Signed)
BHH LCSW Group Therapy  12/16/2013 1:02 PM  Type of Therapy:  Group Therapy  Participation Level:  Minimal  Participation Quality:  Attentive  Affect:  Appropriate  Cognitive:  Alert and Oriented  Insight:  Engaged  Engagement in Therapy:  Engaged  Modes of Intervention:  Confrontation, Discussion, Education, Exploration, Problem-solving, Rapport Building, Socialization and Support  Summary of Progress/Problems: Today's group topic was avoiding self sabotage and enabling behaviors. Group members were asked to define self sabotage and enabling and provide examples. Group members were then asked to discuss unhealthy relationships and how to have positive healthy boundaries with those that enable. Group members were asked to process how communicating needs and establishing a plan to change the above identified behavior. [name] was attentive and engaged throughout today's therapy group. He shared that he often manipulates family members to give him money when he spends his rent money/light bill money on drugs. [name] shows progress in the group setting and improving insight AEB his ability to process how establishing healthy boundaries with his social supports/family supports will help him avoid self sabotage and help his supports avoid unintentional enabling.     Smart, Jmichael Gille LCSWA  12/16/2013, 1:02 PM

## 2013-12-16 NOTE — BHH Group Notes (Deleted)
Type of Therapy: Psychoeducational Skills  Participation Level: Did Not Attend

## 2013-12-16 NOTE — Progress Notes (Signed)
Pt was in bed upon first assessment.  He did come to med window for his am meds and went back to bed. He rated both his depression and hopelessness a 2 and anxiety 4 on his self-inventory.  He wants to go to [place] from here for his substance abuse.

## 2013-12-16 NOTE — BHH Group Notes (Signed)
[facility] Group Notes:  (Nursing/MHT/Case Management/Adjunct)  Date:  12/16/2013  Time:  2:36 PM  Type of Therapy:  Psychoeducational Skills  Participation Level:  Active  Participation Quality:  Appropriate  Affect:  Appropriate  Cognitive:  Appropriate  Insight:  Appropriate  Engagement in Group:  Engaged  Modes of Intervention:  Discussion  Summary of Progress/Problems: Pt attended healthy support systems group, he identified his mom and AA members as his support system.     Jacquelyne BalintForrest, Danita Proud Shanta 12/16/2013, 2:36 PM

## 2013-12-16 NOTE — Progress Notes (Signed)
D.  Pt pleasant on approach, denies complaints other than some anxiety.  Denies SI/HI/hallucinations at this time.  Interacting appropriately within milieu.  A.  Support and encouragement offered  R.  Pt remains safe on unit, will continue to monitor.

## 2013-12-16 NOTE — BHH Group Notes (Signed)
Adult Psychoeducational Group Note   Group Topic/Focus:  Healthy Communication:   The focus of this group is to discuss communication, barriers to communication, as well as healthy ways to communicate with others. Type of Therapy: Psychoeducational Skills  Participation Level: Did Not Attend  

## 2013-12-17 MED ORDER — DIVALPROEX SODIUM ER 250 MG PO TB24: 250.0000 mg | ORAL_TABLET | Freq: Three times a day (TID) | ORAL | Status: DC

## 2013-12-17 MED ORDER — TRAZODONE HCL 100 MG PO TABS: 100.0000 mg | ORAL_TABLET | Freq: Every evening | ORAL | Status: DC | PRN

## 2013-12-17 MED ORDER — NALTREXONE HCL 50 MG PO TABS: 25.0000 mg | ORAL_TABLET | Freq: Every day | ORAL | Status: DC

## 2013-12-17 MED ORDER — NALTREXONE HCL 50 MG PO TABS
25.0000 mg | ORAL_TABLET | Freq: Every day | ORAL | Status: DC
  Administered 2013-12-17: 25 mg via ORAL
  Filled 2013-12-17 (×3): qty 1

## 2013-12-17 MED ORDER — OMEPRAZOLE 40 MG PO CPDR: 40.0000 mg | DELAYED_RELEASE_CAPSULE | Freq: Every day | ORAL | Status: DC

## 2013-12-17 MED ORDER — MIRTAZAPINE 30 MG PO TABS: 30.0000 mg | ORAL_TABLET | Freq: Every day | ORAL | Status: DC

## 2013-12-17 MED ORDER — GABAPENTIN 100 MG PO CAPS: 100.0000 mg | ORAL_CAPSULE | Freq: Three times a day (TID) | ORAL | Status: DC

## 2013-12-17 NOTE — Progress Notes (Signed)
Pt d/c from the hospital. All items returned. D/C instructions given, prescriptions given and samples given. Pt denies si and hi. 

## 2013-12-17 NOTE — BHH Suicide Risk Assessment (Signed)
Suicide Risk Assessment  Discharge Assessment     Demographic Factors:  Male and Caucasian  Total Time spent with patient: 45 minutes  Psychiatric Specialty Exam:     Blood pressure 133/85, pulse 91, temperature 97.5 F (36.4 C), temperature source Oral, resp. rate 16, height 5\' 10"  (1.778 m), weight 68.947 kg (152 lb).Body mass index is 21.81 kg/(m^2).  General Appearance: Fairly Groomed  Patent attorneyye Contact::  Fair  Speech:  Clear and Coherent  Volume:  Normal  Mood:  Euthymic  Affect:  Appropriate  Thought Process:  Coherent and Goal Directed  Orientation:  Full (Time, Place, and Person)  Thought Content:  relapse prevention plan  Suicidal Thoughts:  No  Homicidal Thoughts:  No  Memory:  Immediate;   Fair Recent;   Fair Remote;   Fair  Judgement:  Fair  Insight:  Present  Psychomotor Activity:  Normal  Concentration:  Fair  Recall:  [employer]  Fund of Knowledge:Fair  Language: Fair  Akathisia:  No  Handed:    AIMS (if indicated):     Assets:  Desire for Improvement Social Support Vocational/Educational  Sleep:  Number of Hours: 6.25    Musculoskeletal: Strength & Muscle Tone: within normal limits Gait & Station: normal Patient leans: N/A   Mental Status Per Nursing Assessment::   On Admission:  Suicidal ideation indicated by patient;Suicide plan;Plan includes specific time, place, or method;Self-harm thoughts;Intention to act on suicide plan;Belief that plan would result in death  Current Mental Status by Physician: In full contact with reality. There are no active S/S of withdrawal   Loss Factors: Decrease in vocational status and Financial problems/change in socioeconomic status  Historical Factors: NA  Risk Reduction Factors:   Sense of responsibility to family and Positive social support  Continued Clinical Symptoms:  Depression:   Comorbid alcohol abuse/dependence Impulsivity Alcohol/Substance Abuse/Dependencies  Cognitive Features That Contribute To  Risk:  Closed-mindedness Polarized thinking Thought constriction (tunnel vision)    Suicide Risk:  Minimal: No identifiable suicidal ideation.  Patients presenting with no risk factors but with morbid ruminations; may be classified as minimal risk based on the severity of the depressive symptoms  Discharge Diagnoses:   AXIS I:  Alcohol Dependence, Polysubstance Abuse, MDD, ADHD AXIS II:  No diagnosis AXIS III:   Past Medical History  Diagnosis Date  . Knee pain   . Gastroesophageal reflux disease   . Polysubstance abuse   . Bipolar disorder   . Anxiety and depression   . ADHD, adult residual type   . Depression   . Anxiety    AXIS IV:  other psychosocial or environmental problems AXIS V:  61-70 mild symptoms  Plan Of Care/Follow-up recommendations:  Activity:  as tolerated Diet:  regular Follow Up [facility] Is patient on multiple antipsychotic therapies at discharge:  No   Has Patient had three or more failed trials of antipsychotic monotherapy by history:  No  Recommended Plan for Multiple Antipsychotic Therapies: NA    Analiz Tvedt A 12/17/2013, 10:18 AM

## 2013-12-17 NOTE — B[facility] Group Notes (Signed)
[facility]   12/17/2013 8:45 AM  Participation Quality:  Alert, Appropriate and Oriented  Mood/Affect:  Calm  Depression Rating:  2-3  Anxiety Rating:  2-3  Thoughts of Suicide:  Pt denies SI/HI  Will you contract for safety?   Yes  Current AVH:  Pt denies  Plan for Discharge/Comments:  Pt attended discharge planning group and actively participated in group.  CSW provided pt with today's workbook.  Pt reports feeling ready to d/c today and ready to take care of things today and tomorrow, such as getting an ID.  Pt [facility] and [facility].  [facility] and [facility].  Pt plans to stay with family in [place] until Wednesday.  No further needs voiced by pt at this time.    Transportation Means: Pt reports access to transportation - father will pick pt up today  Supports: Family is supportive  Reyes IvanChelsea Horton, LCSW 12/17/2013 9:45 AM

## 2013-12-17 NOTE — Tx Team (Signed)
Interdisciplinary Treatment Plan Update (Adult)  Date: 12/17/2013  Time Reviewed:  9:45 AM  Progress in Treatment: Attending groups: Yes Participating in groups:  Yes Taking medication as prescribed:  Yes Tolerating medication:  Yes Family/Significant othe contact made: No, pt refused Patient understands diagnosis:  Yes Discussing patient identified problems/goals with staff:  Yes Medical problems stabilized or resolved:  Yes Denies suicidal/homicidal ideation: Yes Issues/concerns per patient self-inventory:  Yes Other:  New problem(s) identified: N/A  Discharge Plan or Barriers: Pt will follow up at [facility] for further inpatient treatment and [facility] for outpatient medication management and therapy.    Reason for Continuation of Hospitalization: Stable to d/c today  Comments: N/A  Estimated length of stay: D/C today  For review of initial/current patient goals, please see plan of care.  Attendees: Patient:     Family:     Physician:  Dr. Dub MikesLugo 12/17/2013 9:51 AM   Nursing:   Roswell Minersonna Shimp, RN 12/17/2013 9:51 AM   Clinical Social Worker:  Reyes Ivanhelsea Horton, LCSW 12/17/2013 9:51 AM   Other: Onnie BoerJennifer Clark, RN case manager 12/17/2013 9:51 AM   Other:  Mordecai RasmussenHannah Coble, LCSW 12/17/2013 9:51 AM   Other:  Serena ColonelAggie Nwoko, NP 12/17/2013 10:11 AM   Other:     Other:    Other:    Other:    Other:    Other:    Other:     Scribe for Treatment Team:   Carmina MillerHorton, Ercel Normoyle Nicole, 12/17/2013 , 9:51 AM
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
## 2013-12-17 NOTE — [facility] Suicide Risk Assessment (Signed)
[facility] Family/Significant Other Suicide Prevention Education  Suicide Prevention Education:   Patient Refusal for Family/Significant Other Suicide Prevention Education: The patient has refused to provide written consent for family/significant other to be provided Family/Significant Other Suicide Prevention Education during admission and/or prior to discharge.  Physician notified.  CSW provided suicide prevention information with patient.    The suicide prevention education provided includes the following:  Suicide risk factors  Suicide prevention and interventions  National Suicide Hotline telephone number  [facility] assessment telephone number  [facility] Emergency Assistance 911  [facility] and/or Residential Mobile Crisis Unit telephone number   Reyes IvanChelsea Horton, [place] 12/17/2013 9:38 AM

## 2013-12-17 NOTE — Progress Notes (Signed)
[facility] Discharge Plan :  Will you be returning to the same living situation after discharge: Yes,  pt plans to stay with family until admission [facility] At discharge, do you have transportation home?:Yes,  father will pick pt up Do you have the ability to pay for your medications:Yes,  provided pt with samples and prescriptions and [facility] with affording meds.   Release of information consent forms completed and in the chart;  Patient's signature needed at discharge.  Patient to Follow up at: Follow-up Information   Follow up with [facility]. (Walk in between 8am-9am Monday through Friday for hospital followup/medication management. )    Contact information:   [address] [place], [place] [address] Phone: [phone] Fax: [phone]      Follow up with [facility] On 12/19/2013. ([facility] If bed available for Wed, arrive by 8am for screening and possible admission. Bring SS card, note from Child psychotherapistsocial worker, meds, and clothing. )    Contact information:   [address] [place], [place] [address] Phone: [phone] Fax: [phone]      Patient denies SI/HI:   Yes,  denies SI/HI    Safety Planning and Suicide Prevention discussed:  Yes,  discussed with pt.  Pt refused consent to provide with family/friend.  See suicide prevention education note.   Daniel MillerHorton, Daniel Arroyo 12/17/2013, 9:52 AM

## 2013-12-19 NOTE — Progress Notes (Signed)
Patient Discharge Instructions:  After Visit Summary (AVS):   Faxed to:  12/19/13 Discharge Summary Note:   Faxed to:  12/19/13 Psychiatric Admission Assessment Note:   Faxed to:  12/19/13 Suicide Risk Assessment - Discharge Assessment:   Faxed to:  12/19/13 Faxed/Sent to the Next Level Care provider:  12/19/13 [facility]axed to [facility] @ [phone] Faxed to [facility] @ [phone]  Jerelene ReddenSheena E Metz, 12/19/2013, 4:09 PM

## 2014-04-21 ENCOUNTER — Emergency Department (HOSPITAL_COMMUNITY)
Admission: EM | Admit: 2014-04-21 | Discharge: 2014-04-21 | Disposition: A | Discharge status: Home / Self Care | Payer: Self-pay | Attending: Emergency Medicine | Admitting: Emergency Medicine

## 2014-04-21 ENCOUNTER — Emergency Department (HOSPITAL_COMMUNITY): Payer: Self-pay

## 2014-04-21 ENCOUNTER — Encounter (HOSPITAL_COMMUNITY): Payer: Self-pay | Admitting: Emergency Medicine

## 2014-04-21 ENCOUNTER — Ambulatory Visit (HOSPITAL_COMMUNITY)
Admission: AD | Admit: 2014-04-21 | Discharge: 2014-04-21 | Disposition: A | Discharge status: Home / Self Care | Payer: Self-pay | Attending: Psychiatry | Admitting: Psychiatry

## 2014-04-21 DIAGNOSIS — Z8659 Personal history of other mental and behavioral disorders: Secondary | ICD-10-CM | POA: Insufficient documentation

## 2014-04-21 DIAGNOSIS — F1020 Alcohol dependence, uncomplicated: Secondary | ICD-10-CM | POA: Insufficient documentation

## 2014-04-21 DIAGNOSIS — F319 Bipolar disorder, unspecified: Secondary | ICD-10-CM | POA: Insufficient documentation

## 2014-04-21 DIAGNOSIS — Z8719 Personal history of other diseases of the digestive system: Secondary | ICD-10-CM | POA: Insufficient documentation

## 2014-04-21 DIAGNOSIS — F1420 Cocaine dependence, uncomplicated: Secondary | ICD-10-CM | POA: Insufficient documentation

## 2014-04-21 DIAGNOSIS — S02400A Malar fracture unspecified, initial encounter for closed fracture: Secondary | ICD-10-CM | POA: Insufficient documentation

## 2014-04-21 DIAGNOSIS — S022XXA Fracture of nasal bones, initial encounter for closed fracture: Secondary | ICD-10-CM | POA: Insufficient documentation

## 2014-04-21 DIAGNOSIS — S199XXA Unspecified injury of neck, initial encounter: Secondary | ICD-10-CM

## 2014-04-21 DIAGNOSIS — F17200 Nicotine dependence, unspecified, uncomplicated: Secondary | ICD-10-CM | POA: Insufficient documentation

## 2014-04-21 DIAGNOSIS — S02401A Maxillary fracture, unspecified, initial encounter for closed fracture: Principal | ICD-10-CM | POA: Insufficient documentation

## 2014-04-21 DIAGNOSIS — F332 Major depressive disorder, recurrent severe without psychotic features: Secondary | ICD-10-CM | POA: Insufficient documentation

## 2014-04-21 DIAGNOSIS — S0240CA Maxillary fracture, right side, initial encounter for closed fracture: Secondary | ICD-10-CM

## 2014-04-21 DIAGNOSIS — S0993XA Unspecified injury of face, initial encounter: Secondary | ICD-10-CM | POA: Insufficient documentation

## 2014-04-21 DIAGNOSIS — F909 Attention-deficit hyperactivity disorder, unspecified type: Secondary | ICD-10-CM | POA: Insufficient documentation

## 2014-04-21 DIAGNOSIS — F411 Generalized anxiety disorder: Secondary | ICD-10-CM | POA: Insufficient documentation

## 2014-04-21 MED ORDER — HYDROCODONE-ACETAMINOPHEN 5-325 MG PO TABS
2.0000 | ORAL_TABLET | Freq: Once | ORAL | Status: AC
  Administered 2014-04-21: 2 via ORAL
  Filled 2014-04-21: qty 2

## 2014-04-21 MED ORDER — HYDROCODONE-ACETAMINOPHEN 5-325 MG PO TABS: 1.0000 | ORAL_TABLET | Freq: Four times a day (QID) | ORAL | Status: DC | PRN

## 2014-04-21 NOTE — ED Notes (Signed)
Pt returned from CT °

## 2014-04-21 NOTE — ED Notes (Signed)
MD Wofford at bedside.  

## 2014-04-21 NOTE — ED Notes (Signed)
Raynelle FanningJulie NT assisted patient with phone.

## 2014-04-21 NOTE — Discharge Instructions (Signed)
DO NOT DRINK ALCOHOL WHILE TAKING HYDROCODONE!  Facial Fracture A facial fracture is a break in one of the bones of your face. HOME CARE INSTRUCTIONS   Protect the injured part of your face until it is healed.  Do not participate in activities which give chance for re-injury until your doctor approves.  Gently wash and dry your face.  Wear head and facial protection while riding a bicycle, motorcycle, or snowmobile. SEEK MEDICAL CARE IF:   An oral temperature above 102 F (38.9 C) develops.  You have severe headaches or notice changes in your vision.  You have new numbness or tingling in your face.  You develop nausea (feeling sick to your stomach), vomiting or a stiff neck. SEEK IMMEDIATE MEDICAL CARE IF:   You develop difficulty seeing or experience double vision.  You become dizzy, lightheaded, or faint.  You develop trouble speaking, breathing, or swallowing.  You have a watery discharge from your nose or ear. MAKE SURE YOU:   Understand these instructions.  Will watch your condition.  Will get help right away if you are not doing well or get worse. Document Released: 09/06/2005 Document Revised: 11/29/2011 Document Reviewed: 04/25/2008 [facility] Patient Information 2015 [place], [place]. This information is not intended to replace advice given to you by your health care provider. Make sure you discuss any questions you have with your health care provider.   Emergency Department Resource Guide 1) Find a Doctor and Pay Out of Pocket Although you won't have to find out who is covered by your insurance plan, it is a good idea to ask around and get recommendations. You will then need to call the office and see if the doctor you have chosen will accept you as a new patient and what types of options they offer for patients who are self-pay. Some doctors offer discounts or will set up payment plans for their patients who do not have insurance, but you will need to ask so you  aren't surprised when you get to your appointment.  2) Contact Your Local Health Department Not all health departments have doctors that can see patients for sick visits, but many do, so it is worth a call to see if yours does. If you don't know where your local health department is, you can check in your phone book. The CDC also has a tool to help you locate your state's health department, and many state websites also have listings of all of their local health departments.  3) Find a Walk-in Clinic If your illness is not likely to be very severe or complicated, you may want to try a walk in clinic. These are popping up all over the country in pharmacies, drugstores, and shopping centers. They're usually staffed by nurse practitioners or physician assistants that have been trained to treat common illnesses and complaints. They're usually fairly quick and inexpensive. However, if you have serious medical issues or chronic medical problems, these are probably not your best option.  No Primary Care Doctor: - Call Health Connect at  [phone] - they can help you locate a primary care doctor that  accepts your insurance, provides certain services, etc. - Physician Referral Service- [phone]  Chronic Pain Problems: Organization         Address  Phone   Notes  [facility]  [phone] Patients need to be referred by their primary care doctor.   Medication Assistance: Organization         Address  Phone   Notes  [facility] Medication [facility] [address] [place], [place] [address] [phone] --Must be a resident of [facility] [place] -- Must have NO insurance coverage whatsoever (no Medicaid/ Medicare, etc.) -- The pt. MUST have a primary care doctor that directs their care regularly and follows them in the community   MedAssist  [phone]   [employer]  [phone]    Agencies that provide inexpensive medical care: Organization          Address  Phone   Notes  [facility]  [phone]   [facility]    [phone](336) [phone]   [facility] [address] [place], [place] [address] [phone]) [phone]   [facility] [place] [address], [place] [phone]) [phone]   Planned Parenthood    [phone]) [phone]   [facility]    [phone]   Community Health and [facility]  [address], [place] Phone:  [phone], Fax:  [phone] Hours of Operation:  9 am - 6 pm, M-F.  Also accepts Medicaid/Medicare and self-pay.  [facility]  [address], [place] Phone: [phone], Fax: [phone]) [phone]. Hours of Operation:  8:30 am - 5:30 pm, M-F.  Also accepts Medicaid and self-pay.  [facility] [place] [address], [place] Phone: [phone]) [phone]   [facility] [address], [place], [place] [phone], Ext. [phone] Mondays & Thursdays: 7-9 AM.  First 15 patients are seen on a first come, first serve basis.    Medicaid-accepting [facility] Providers:  Organization         Address  Phone   Notes  [facility] [address], [place] [phone]) [phone] Also accepts self-pay patients.  [facility] [address], [place]  [phone]) [phone]   [facility] [address], [place] [phone]) [phone]   [facility] [address] [place] [address], [place] [phone]) [phone]   Renaye RakersVeita Bland [address], [place]   [phone]) [phone] Only accepts [place] Access [place] patients after they have their name applied to their card.   Self-Pay (no insurance) in [place] [facility] [place]:  Organization         Address  Phone   Notes  Sickle Cell Patients, [facility] [address], [place] [phone]) [phone]   [facility] [address], [place] [phone]) [phone]    [facility] [place]  [address], [place] [phone]) [phone]   [facility]/Dr. Osei-Bonsu  [address] [place] [address], [place] or [phone], [place] [phone]) [phone] Phone number for both [place] and [place] locations is the same.  [facility] [address], [place] [phone]) [phone]   [facility] [place] [address] [place] [address], [place] or [address] [phone]) [phone](336) [phone]   [facility] [address], [place] [phone]) [phone], phone; [phone]) [phone], fax Sees patients 1st and 3rd Saturday of every month.  Must not qualify for public or private insurance (i.e. Medicaid, Medicare,  Health Choice, Veterans' Benefits)  Household income should be no more than 200% of the poverty level The clinic cannot treat you if you are pregnant or think you are pregnant  Sexually transmitted diseases are not treated at the clinic.    Dental Care: Organization         Address  Phone  Notes  [facility] of [facility] [address] [place], [place] [phone] Accepts children up to age 5 who are enrolled in [place] or [place] Health Choice; pregnant women with a Medicaid card; and children who have applied for Medicaid or [place] Health Choice, but were declined, whose parents can pay a reduced fee at time of service.  [facility] Department of [facility]  [address], [place] [phone] Accepts children up to age 13 who are enrolled in [place] or [place] Health Choice; pregnant women with a Medicaid card; and children who have applied for Medicaid or [place] Health Choice, but were declined, whose parents can pay a reduced fee at time of service.  [facility]  [address] [place], [place] [phone] Patients are seen by appointment only. Walk-ins are not accepted. [facility] will see patients 23  years of age and older. Monday - Tuesday (8am-5pm) Most Wednesdays (8:30-5pm) $30 per visit, cash only  [facility] Access PRO[facility]  [address], [facility] [phone] Patients are seen by appointment only. Walk-ins are not accepted. [facility] will see patients 59 years of age and older. One Wednesday Evening (Monthly: Volunteer Based).  $30 per visit, cash only  [employer]  [phone] for adults; Children under age 55, call Graduate Pediatric Dentistry at [phone]. Children aged 49-14, please call [phone] to request a pediatric application.  Dental services are provided in all areas of dental care including fillings, crowns and bridges, complete and partial dentures, implants, gum treatment, root canals, and extractions. Preventive care is also provided. Treatment is provided to both adults and children. Patients are selected via a lottery and there is often a waiting list.   [facility] [address], [place]  [phone] [email or web address]   [facility] [address] [place], [place] [phone], Ext. [phone] Second and Fourth Thursday of each month, opens at 6:30 AM; Clinic ends at 9 AM.  Patients are seen on a first-come first-served basis, and a limited number are seen during each clinic.   [facility]  [address] [place] [employer], [place] [phone]   Eligibility Requirements You must have lived in [place], [place], or [place] counties for at least the last three months.   You cannot be eligible for state or federal sponsored [place], including [employer], [place], or [employer].   You generally cannot be eligible for healthcare insurance through your employer.    How to apply: Eligibility screenings are held every Tuesday and Wednesday afternoon from 1:00 pm until 4:00 pm. You do not need an appointment for the interview!  [facility]  [address], [place], [place] [phone]   [facility] Health Department  [phone]   [facility] Health Department  [phone]   [facility] Health Department  [phone]    Behavioral Health Resources in the Community: Intensive Outpatient Programs Organization         Address  Phone  Notes  [address] [address] [place], [place] [phone]   [facility] [address] [place], [place] [phone]   [facility] [address] [place], [place]  [phone]   [facility] [address],  [place], [place] [phone] or [phone]   Substance Abuse Resources Organization         Address  Phone  Notes  [facility]  [phone]   [facility]  [phone]   [facility]  [phone]   [facility]  [phone]   Residential & Outpatient Substance Abuse Program  [phone]   Psychological Services Organization         Address  Phone  Notes  [facility]  [phone]- [phone]   [facility]  [phone]- [phone]   [facility] [address], [place] [phone] or [phone]    Mobile Crisis Teams Organization         Address  Phone  Notes  [facility], Mobile Crisis Care Unit  [phone]   [facility]  [address] [place], [place] [phone]   Doristine LocksSharon DeEsch [address] [place] [phone]    Self-Help/Support Groups Organization         Address  Phone             Notes  [facility] of [place] - variety of support groups  [phone] Call for more information  Narcotics Anonymous (NA), [facility] [address], [place]  2 meetings at this location   Statisticianesidential Treatment Programs Organization         Address  Phone  Notes  [facility] [address],    [place]   [phone]   [facility]  [address], [place] [id number], [place], [place] [phone]   [facility] [address], [place] [phone] Admissions: 8am-3pm M-F  [facility] [address]    [place], [place] [phone]   [facility] [address], [place], [place] [phone]   [facility] [address]  [place], [place] [phone]   [facility] - Intensive Outpatient [address], [place], [place] [phone]   [facility] ([facility]iction Recovery [facility] [address]  [place], [place] [phone] or [phone]   [facility] (RT[facility]) [address] [place], [place] [phone] Accepts Medicaid  [facility] [address]  [place] [phone] Substance Abuse/Addiction Treatment   [facility] Behavioral Health Resources Organization         Address  Phone  Notes  [facility]  [phone]) [phone]   Angie FavaJulie Brannon, PhD [address], [address] [place], [place]   [phone]) [phone] or [phone](336) [phone]   [facility] [place]   [address] [place], [place] [phone](336) [phone]   [facility] [address], [place], [place] [phone](336) [phone] Insurance/Medicaid/sponsorship through [facility] [address]                                    [place], [place] [phone](336) [phone] Therapy/tele-psych/case  [facility] [address]   [place], [place] [phone](336) [phone]    Dr. Lolly MustacheArfeen  [phone]) [phone]   [facility] [place]  [employer] [facility] 1) [address], [place] 2) [address], [place] 3)  [address], [place] [phone]) [phone]) [phone]  [phone](336) [phone]   [facility] Child Abuse Hotline [phone](336) [phone] or [phone](336) [phone] (  After Hours)    ° ° ° °

## 2014-04-21 NOTE — ED Notes (Signed)
Patient is alert and oriented x3.  He was given DC instructions and follow up visit instructions.  Patient gave verbal understanding.  He was DC ambulatory under his own power to home.  V/S stable.  He was not showing any signs of distress on DC 
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
## 2014-04-21 NOTE — ED Notes (Signed)
Ice pack offered to patient for facial swelling but patient refuses.

## 2014-04-21 NOTE — ED Notes (Addendum)
This RN asks patient if he wants detox he verbalizes that he DOES want detox from alcohol. Pt denies SI or HI. Pt also denies any further needs.   MD Wofford made aware of patient decision and will speak to patient.

## 2014-04-21 NOTE — ED Notes (Signed)
Pt in CT.

## 2014-04-21 NOTE — ED Notes (Signed)
Bus pass given 

## 2014-04-21 NOTE — ED Notes (Signed)
Swelling noted to right side of face  
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
## 2014-04-21 NOTE — ED Notes (Signed)
Pt c/o right sided facial pain and is requesting pain medication. He is also speaking on the phone and asking me to speak to his ex wife who is a Engineer, civil (consulting)nurse.  Patient's ex-wife requests that I address patient's concern for pain medication. I assure her that I will speak to the MD. Patient reports to her that "they aren't doing shit here".
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
## 2014-04-22 ENCOUNTER — Inpatient Hospital Stay (HOSPITAL_COMMUNITY)
Admission: AD | Admit: 2014-04-22 | Discharge: 2014-04-25 | DRG: 897 | Disposition: A | Discharge status: Home / Self Care | Payer: No Typology Code available for payment source | Source: Intra-hospital | Attending: Psychiatry | Admitting: Psychiatry

## 2014-04-22 ENCOUNTER — Encounter (HOSPITAL_COMMUNITY): Payer: Self-pay | Admitting: Emergency Medicine

## 2014-04-22 ENCOUNTER — Emergency Department (HOSPITAL_COMMUNITY)
Admission: EM | Admit: 2014-04-22 | Discharge: 2014-04-22 | Disposition: A | Discharge status: Home / Self Care | Payer: Self-pay | Attending: Emergency Medicine | Admitting: Emergency Medicine

## 2014-04-22 DIAGNOSIS — F1424 Cocaine dependence with cocaine-induced mood disorder: Secondary | ICD-10-CM

## 2014-04-22 DIAGNOSIS — IMO0002 Reserved for concepts with insufficient information to code with codable children: Secondary | ICD-10-CM | POA: Insufficient documentation

## 2014-04-22 DIAGNOSIS — F1010 Alcohol abuse, uncomplicated: Secondary | ICD-10-CM

## 2014-04-22 DIAGNOSIS — F332 Major depressive disorder, recurrent severe without psychotic features: Secondary | ICD-10-CM

## 2014-04-22 DIAGNOSIS — Z8719 Personal history of other diseases of the digestive system: Secondary | ICD-10-CM | POA: Insufficient documentation

## 2014-04-22 DIAGNOSIS — F411 Generalized anxiety disorder: Secondary | ICD-10-CM | POA: Diagnosis present

## 2014-04-22 DIAGNOSIS — K219 Gastro-esophageal reflux disease without esophagitis: Secondary | ICD-10-CM | POA: Diagnosis present

## 2014-04-22 DIAGNOSIS — F17200 Nicotine dependence, unspecified, uncomplicated: Secondary | ICD-10-CM | POA: Insufficient documentation

## 2014-04-22 DIAGNOSIS — G4700 Insomnia, unspecified: Secondary | ICD-10-CM | POA: Diagnosis present

## 2014-04-22 DIAGNOSIS — F1994 Other psychoactive substance use, unspecified with psychoactive substance-induced mood disorder: Secondary | ICD-10-CM | POA: Diagnosis present

## 2014-04-22 DIAGNOSIS — F1420 Cocaine dependence, uncomplicated: Secondary | ICD-10-CM | POA: Diagnosis present

## 2014-04-22 DIAGNOSIS — F10230 Alcohol dependence with withdrawal, uncomplicated: Secondary | ICD-10-CM

## 2014-04-22 DIAGNOSIS — F1910 Other psychoactive substance abuse, uncomplicated: Secondary | ICD-10-CM

## 2014-04-22 DIAGNOSIS — F339 Major depressive disorder, recurrent, unspecified: Secondary | ICD-10-CM | POA: Diagnosis present

## 2014-04-22 DIAGNOSIS — F410 Panic disorder [episodic paroxysmal anxiety] without agoraphobia: Secondary | ICD-10-CM | POA: Diagnosis present

## 2014-04-22 DIAGNOSIS — R45851 Suicidal ideations: Secondary | ICD-10-CM | POA: Insufficient documentation

## 2014-04-22 DIAGNOSIS — F1020 Alcohol dependence, uncomplicated: Principal | ICD-10-CM | POA: Diagnosis present

## 2014-04-22 DIAGNOSIS — S1093XA Contusion of unspecified part of neck, initial encounter: Secondary | ICD-10-CM

## 2014-04-22 DIAGNOSIS — F319 Bipolar disorder, unspecified: Secondary | ICD-10-CM | POA: Diagnosis present

## 2014-04-22 DIAGNOSIS — F329 Major depressive disorder, single episode, unspecified: Secondary | ICD-10-CM | POA: Diagnosis present

## 2014-04-22 DIAGNOSIS — S0003XA Contusion of scalp, initial encounter: Secondary | ICD-10-CM | POA: Insufficient documentation

## 2014-04-22 DIAGNOSIS — F1410 Cocaine abuse, uncomplicated: Secondary | ICD-10-CM | POA: Insufficient documentation

## 2014-04-22 DIAGNOSIS — F909 Attention-deficit hyperactivity disorder, unspecified type: Secondary | ICD-10-CM | POA: Diagnosis present

## 2014-04-22 DIAGNOSIS — S0083XA Contusion of other part of head, initial encounter: Secondary | ICD-10-CM | POA: Insufficient documentation

## 2014-04-22 DIAGNOSIS — G8911 Acute pain due to trauma: Secondary | ICD-10-CM | POA: Insufficient documentation

## 2014-04-22 DIAGNOSIS — F919 Conduct disorder, unspecified: Secondary | ICD-10-CM | POA: Insufficient documentation

## 2014-04-22 LAB — COMPREHENSIVE METABOLIC PANEL
ALBUMIN: 3.5 g/dL (ref 3.5–5.2)
ALT: 12 U/L (ref 0–53)
AST: 19 U/L (ref 0–37)
Alkaline Phosphatase: 102 U/L (ref 39–117)
Anion gap: 14 (ref 5–15)
BUN: 5 mg/dL — ABNORMAL LOW (ref 6–23)
CO2: 25 mEq/L (ref 19–32)
CREATININE: 0.7 mg/dL (ref 0.50–1.35)
Calcium: 9.4 mg/dL (ref 8.4–10.5)
Chloride: 98 mEq/L (ref 96–112)
GFR calc Af Amer: >90 mL/min (ref >90)
GFR calc non Af Amer: >90 mL/min (ref >90)
Glucose, Bld: 95 mg/dL (ref 70–99)
Potassium: 3.3 mEq/L — ABNORMAL LOW (ref 3.7–5.3)
SODIUM: 137 meq/L (ref 137–147)
TOTAL PROTEIN: 7 g/dL (ref 6.0–8.3)
Total Bilirubin: 0.3 mg/dL (ref 0.3–1.2)

## 2014-04-22 LAB — CBC
HCT: 39 % (ref 39.0–52.0)
Hemoglobin: 14.1 g/dL (ref 13.0–17.0)
MCH: 34.2 pg — AB (ref 26.0–34.0)
MCHC: 36.2 g/dL — ABNORMAL HIGH (ref 30.0–36.0)
MCV: 94.7 fL (ref 78.0–100.0)
Platelets: 291 10*3/uL (ref 150–400)
RBC: 4.12 MIL/uL — ABNORMAL LOW (ref 4.22–5.81)
RDW: 13.6 % (ref 11.5–15.5)
WBC: 10.3 10*3/uL (ref 4.0–10.5)

## 2014-04-22 LAB — RAPID URINE DRUG SCREEN, HOSP PERFORMED
Amphetamines: NOT DETECTED
Barbiturates: NOT DETECTED
Benzodiazepines: NOT DETECTED
Cocaine: POSITIVE — AB
OPIATES: NOT DETECTED
Tetrahydrocannabinol: NOT DETECTED

## 2014-04-22 LAB — ETHANOL: ALCOHOL ETHYL (B): 86 mg/dL — AB (ref 0–11)

## 2014-04-22 LAB — ACETAMINOPHEN LEVEL: Acetaminophen (Tylenol), Serum: <15 ug/mL (ref 10–30)

## 2014-04-22 LAB — SALICYLATE LEVEL: Salicylate Lvl: <2 mg/dL — ABNORMAL LOW (ref 2.8–20.0)

## 2014-04-22 MED ORDER — TRAZODONE HCL 50 MG PO TABS
50.0000 mg | ORAL_TABLET | Freq: Every evening | ORAL | Status: DC | PRN
  Administered 2014-04-22 – 2014-04-24 (×2): 50 mg via ORAL
  Filled 2014-04-22 (×2): qty 1
  Filled 2014-04-22: qty 14

## 2014-04-22 MED ORDER — PNEUMOCOCCAL VAC POLYVALENT 25 MCG/0.5ML IJ INJ
0.5000 mL | INJECTION | INTRAMUSCULAR | Status: AC
  Administered 2014-04-23: 0.5 mL via INTRAMUSCULAR

## 2014-04-22 MED ORDER — ACETAMINOPHEN 325 MG PO TABS: 650.0000 mg | ORAL_TABLET | ORAL | Status: DC | PRN

## 2014-04-22 MED ORDER — CLONIDINE HCL 0.1 MG PO TABS
0.1000 mg | ORAL_TABLET | Freq: Two times a day (BID) | ORAL | Status: DC
  Administered 2014-04-22 – 2014-04-25 (×6): 0.1 mg via ORAL
  Filled 2014-04-22 (×2): qty 1
  Filled 2014-04-22: qty 28
  Filled 2014-04-22 (×8): qty 1
  Filled 2014-04-22: qty 28

## 2014-04-22 MED ORDER — LORAZEPAM 1 MG PO TABS
0.0000 mg | ORAL_TABLET | Freq: Two times a day (BID) | ORAL | Status: DC
  Administered 2014-04-24 – 2014-04-25 (×2): 1 mg via ORAL
  Filled 2014-04-22 (×3): qty 1

## 2014-04-22 MED ORDER — ALUM & MAG HYDROXIDE-SIMETH 200-200-20 MG/5ML PO SUSP: 30.0000 mL | ORAL | Status: DC | PRN

## 2014-04-22 MED ORDER — LORAZEPAM 1 MG PO TABS
0.0000 mg | ORAL_TABLET | Freq: Four times a day (QID) | ORAL | Status: AC
  Administered 2014-04-22 – 2014-04-24 (×5): 1 mg via ORAL
  Filled 2014-04-22 (×5): qty 1

## 2014-04-22 MED ORDER — LORAZEPAM 1 MG PO TABS: 0.0000 mg | ORAL_TABLET | Freq: Two times a day (BID) | ORAL | Status: DC

## 2014-04-22 MED ORDER — HYDROXYZINE HCL 25 MG PO TABS
25.0000 mg | ORAL_TABLET | Freq: Four times a day (QID) | ORAL | Status: DC | PRN
  Administered 2014-04-23 – 2014-04-24 (×3): 25 mg via ORAL
  Filled 2014-04-22 (×4): qty 1

## 2014-04-22 MED ORDER — NICOTINE 21 MG/24HR TD PT24
21.0000 mg | MEDICATED_PATCH | Freq: Every day | TRANSDERMAL | Status: DC
  Administered 2014-04-22: 21 mg via TRANSDERMAL
  Filled 2014-04-22: qty 1

## 2014-04-22 MED ORDER — ONDANSETRON HCL 4 MG PO TABS: 4.0000 mg | ORAL_TABLET | Freq: Three times a day (TID) | ORAL | Status: DC | PRN

## 2014-04-22 MED ORDER — IBUPROFEN 200 MG PO TABS
600.0000 mg | ORAL_TABLET | Freq: Three times a day (TID) | ORAL | Status: DC | PRN
  Administered 2014-04-22 (×2): 600 mg via ORAL
  Filled 2014-04-22 (×2): qty 3

## 2014-04-22 MED ORDER — IBUPROFEN 600 MG PO TABS
600.0000 mg | ORAL_TABLET | Freq: Three times a day (TID) | ORAL | Status: DC | PRN
  Administered 2014-04-22 – 2014-04-23 (×3): 600 mg via ORAL
  Filled 2014-04-22 (×4): qty 1

## 2014-04-22 MED ORDER — ZOLPIDEM TARTRATE 5 MG PO TABS: 5.0000 mg | ORAL_TABLET | Freq: Every evening | ORAL | Status: DC | PRN

## 2014-04-22 MED ORDER — NICOTINE 21 MG/24HR TD PT24
21.0000 mg | MEDICATED_PATCH | Freq: Every day | TRANSDERMAL | Status: DC
  Administered 2014-04-23 – 2014-04-25 (×3): 21 mg via TRANSDERMAL
  Filled 2014-04-22 (×6): qty 1

## 2014-04-22 MED ORDER — LORAZEPAM 1 MG PO TABS
0.0000 mg | ORAL_TABLET | Freq: Four times a day (QID) | ORAL | Status: DC
  Administered 2014-04-22 (×2): 1 mg via ORAL
  Filled 2014-04-22: qty 2
  Filled 2014-04-22: qty 1

## 2014-04-22 MED ORDER — MAGNESIUM HYDROXIDE 400 MG/5ML PO SUSP: 30.0000 mL | Freq: Every day | ORAL | Status: DC | PRN

## 2014-04-22 MED ORDER — HYDROCODONE-ACETAMINOPHEN 5-325 MG PO TABS
1.0000 | ORAL_TABLET | Freq: Four times a day (QID) | ORAL | Status: DC | PRN
  Administered 2014-04-22: 1 via ORAL
  Filled 2014-04-22: qty 1
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
## 2014-04-22 NOTE — ED Notes (Signed)
MD at bedside. 

## 2014-04-22 NOTE — ED Notes (Signed)
Bed: WA10 Expected date:  Expected time:  Means of arrival:  Comments: TR 4 
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
## 2014-04-22 NOTE — ED Provider Notes (Signed)
  Medical screening examination/treatment/procedure(s) were performed by non-physician practitioner and as supervising physician I was immediately available for consultation/collaboration.   EKG Interpretation None         Gerhard Munchobert Jacquel Redditt, MD 04/22/14 [phone]

## 2014-04-22 NOTE — ED Notes (Signed)
[employer] at bedside 
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
## 2014-04-22 NOTE — ED Notes (Signed)
Patient is resting comfortably. Breathing WNL.  

## 2014-04-22 NOTE — ED Notes (Signed)
Pt presents with request for detox process: ETOH and cocaine. Last drink 7 -8 beers 6 hours ago. Pt states he last used drugs Saturday morning.   Pt alert and oriented x 4.  Breathing WNL

## 2014-04-22 NOTE — Progress Notes (Signed)
[facility] Community Liaison Stacy,  ° °Provided pt with a list of primary care resources and a [facility] Orange Card application to help patient establish primary care.  °

## 2014-04-22 NOTE — BH Assessment (Signed)
Spoke with [name],LCSW at [facility] and completed telephone screening and scheduled an appointment for patient on Monday,04-29-14 @8am  for bed placement. Patient has been notified of this.  Pt is currently being referred to other treatment facilities and [facility]. Per Dr.Kumar patient is appropriate for a 300 Hall bed.   Daniel PeachNajah Aarianna Hoadley, MS, LCASA Assessment Counselor

## 2014-04-22 NOTE — ED Notes (Signed)
All [facility].

## 2014-04-22 NOTE — ED Notes (Addendum)
[facility], patient is currently awaiting placement

## 2014-04-22 NOTE — ED Notes (Signed)
Off-duty [employer] officer at bedside.

## 2014-04-22 NOTE — Tx Team (Signed)
Initial Interdisciplinary Treatment Plan   PATIENT STRESSORS: Financial difficulties Substance abuse   PROBLEM LIST: Problem List/Patient Goals Date to be addressed Date deferred Reason deferred Estimated date of resolution  Substance Abuse 04/22/14     Depression 04/22/14                                                DISCHARGE CRITERIA:  Adequate post-discharge living arrangements Improved stabilization in mood, thinking, and/or behavior Motivation to continue treatment in a less acute level of care Need for constant or close observation no longer present Reduction of life-threatening or endangering symptoms to within safe limits Verbal commitment to aftercare and medication compliance Withdrawal symptoms are absent or subacute and managed without 24-hour nursing intervention  PRELIMINARY DISCHARGE PLAN: Attend 12-step recovery group Placement in alternative living arrangements  PATIENT/FAMIILY INVOLVEMENT: This treatment plan has been presented to and reviewed with the patient, Daniel Arroyo, and/or family member,   The patient and family have been given the opportunity to ask questions and make suggestions.  Daniel Arroyo 04/22/2014, 11:59 PM

## 2014-04-22 NOTE — ED Notes (Addendum)
Pt belongings:  Bag #1- Brown flip flops, brown belt, jeans, and a blue striped shirt.  Bag #2- folders, notebooks, wallet, 4 packs of cigarettes, brown lighter, and a key.  Belongings in [facility].

## 2014-04-22 NOTE — Progress Notes (Signed)
Attempted to secure placement and faxed to the following facilites:  [facility]- Dephanie- faxed information [facility]- Inocencio HomesGayle- faxed information [place]- Albin Fellingarla- faxed information [place]- Harriett SineNancy- faxed information [facility]- Byrd HesselbachMaria- faxed information [facility]- [place]- faxed information [facility]- [facility]- faxed information [facility]- faxed information  At [place]: [facility]- RoseMary- no beds [facility]- April- no beds [facility]- [facility]- no beds [place]- Arline Aspindy- no beds [place]- Skipper ClicheSarea- no beds [place]- Morrie Sheldonshley- only child beds [facility]- [place]- no beds [place]- [place]- no beds [place]- left message Constance GoltzC[facility]- Patricia- called back later for possible discharges [facility]- Christiane HaJonathan- no beds [facility]- Tonya- no beds [facility]- only 1 male adolescent bed Alvia GroveBrynn Marr- Dianna- no beds [facility]- Chales AbrahamsMary Ann- no beds [facility]- Joan- no beds Nadara E[facility]- Melissa- only child/adolscent beds Turner Danielsowan- left message Margarette Reginold AgentPardee- Lindsay- no beds [facility]- Luke- no beds  Maryelizabeth Rowanressa Zakhai Meisinger, MSW, KidronLCSWA, 04/22/2014 Evening Clinical Social Worker [phone]
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
## 2014-04-22 NOTE — Progress Notes (Signed)
Pt. Requesting car title from locker.  Car title given to pt.  Pt. Is going to keep Title in his room and reports that he will be responsible for keeping up with it on the unit.

## 2014-04-22 NOTE — BH Assessment (Signed)
[facility] assigned to bed 304[facility]1. Support paperwork is completed.  Glorious PeachNajah Sarely Stracener, MS, LCASA Assessment Counselor

## 2014-04-22 NOTE — ED Notes (Addendum)
Pt given belonging bag w/ folders and notebook, so he can provide [employer] w/ car information.  Brown bag w/ cigarettes and lighter removed from bag.
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
## 2014-04-23 DIAGNOSIS — F909 Attention-deficit hyperactivity disorder, unspecified type: Secondary | ICD-10-CM

## 2014-04-23 DIAGNOSIS — F1420 Cocaine dependence, uncomplicated: Secondary | ICD-10-CM | POA: Diagnosis present

## 2014-04-23 DIAGNOSIS — F1994 Other psychoactive substance use, unspecified with psychoactive substance-induced mood disorder: Secondary | ICD-10-CM | POA: Diagnosis present

## 2014-04-23 NOTE — Progress Notes (Signed)
Adult Psychoeducational Group Note  Date:  04/23/2014 Time:  11:36 PM  Group Topic/Focus:  Wrap-Up Group:   The focus of this group is to help patients review their daily goal of treatment and discuss progress on daily workbooks.  Participation Level:  Did Not Attend  Additional Comments:  Pt did not attend wrap up group. He, instead, slept. He slept the remainder of the evening. However, when he was woken up to get vital signs, he was very pleasant and cooperative.   Rosilyn MingsMingia, Nicholl Onstott A 04/23/2014, 11:36 PM

## 2014-04-23 NOTE — [facility] Suicide Risk Assessment (Signed)
Suicide Risk Assessment  Admission Assessment     Nursing information obtained from:  Patient Demographic factors:  Male;Divorced or widowed;Caucasian;Low socioeconomic status;Living alone;Unemployed Current Mental Status:  Suicidal ideation indicated by patient;Self-harm thoughts;Self-harm behaviors Loss Factors:  Decrease in vocational status;Loss of significant relationship;Decline in physical health;Legal issues;Financial problems / change in socioeconomic status Historical Factors:  Family history of mental illness or substance abuse;Impulsivity Risk Reduction Factors:  Religious beliefs about death Total Time spent with patient: 45 minutes  CLINICAL FACTORS:   Depression:   Comorbid alcohol abuse/dependence Alcohol/Substance Abuse/Dependencies COGNITIVE FEATURES THAT CONTRIBUTE TO RISK:  Closed-mindedness Polarized thinking Thought constriction (tunnel vision)    SUICIDE RISK:   Moderate:  Frequent suicidal ideation with limited intensity, and duration, some specificity in terms of plans, no associated intent, good self-control, limited dysphoria/symptomatology, some risk factors present, and identifiable protective factors, including available and accessible social support.  PLAN OF CARE: Supportive approach/coping skills/relapse prevention                              Identify detox needs                              Reassess and address the co morbidities  I certify that inpatient services furnished can reasonably be expected to improve the patient's condition.  Ambree Frances A 04/23/2014, 6:38 PM
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
## 2014-04-23 NOTE — Progress Notes (Signed)
D: Pt presents with anxious affect and mood.  Pt denies SI/HI.  Denies AH/VH.  Pt reports "I'm just feeling anxious right now." A:  Vistaril 25mg  PO PRN administered for anxiety per order.  Encouraged pt to participate in group.  Encouraged pt to report any concerns.  Safety maintained. R: PRN medication was effective.  Pt remains in his room with no interactions with peers.  Did not attend evening group.  Will continue to monitor for safety.

## 2014-04-23 NOTE — Progress Notes (Signed)
Recreation Therapy Notes  Animal-Assisted Activity/Therapy (AAA/T) Program Checklist/Progress Notes Patient Eligibility Criteria Checklist & Daily Group note for Rec Tx Intervention  Date: 08.04.2015 Time: 2:45pm Location: [address] Dayroom   AAA/T Program Assumption of Risk Form signed by Patient/ or Parent Legal Guardian yes  Patient is free of allergies or sever asthma yes  Patient reports no fear of animals yes  Patient reports no history of cruelty to animals yes   Patient understands his/her participation is voluntary yes  Behavioral Response: Did not attend.   Laycee Fitzsimmons L Gwendola Hornaday, LRT/CTRS  Leron Stoffers L 04/23/2014 4:07 PM 

## 2014-04-23 NOTE — Tx Team (Signed)
Interdisciplinary Treatment Plan Update (Adult) Date: 04/23/2014   Time Reviewed: 12:53 PM   Progress in Treatment: Attending groups: Yes Participating in groups: Yes Taking medication as prescribed: Yes Tolerating medication: Yes Family/Significant other contact made: CSW assessing Patient understands diagnosis: Yes Discussing patient identified problems/goals with staff: Yes Medical problems stabilized or resolved: Yes Denies suicidal/homicidal ideation: Yes Issues/concerns per patient self-inventory: Yes Other:  New problem(s) identified: N/A  Discharge Plan or Barriers: CSW assessing for appropriate referrals.   Reason for Continuation of Hospitalization: Anxiety Depression Medication Stabilization  Comments: N/A  Estimated length of stay: 3-5 days  For review of initial/current patient goals, please see plan of care.  Attendees: Patient:    Family:    Physician: Dr. Dub MikesLugo 04/23/2014  12:53 PM  Nursing:  04/23/2014  12:53 PM   Clinical Social Worker: Belenda CruiseKristin Jahnessa Vanduyn, LCSWA 04/23/2014  12:53 PM         Other: Serena ColonelAggie Nwoko, NP 04/23/2014  12:53 PM   Other:    Other:    Other:    Other:    Other:    Other:    Other:     Scribe for Treatment Team:  Samuella BruinKristin Zakeria Kulzer, MSW, [employer]CSWA [phone]

## 2014-04-23 NOTE — Progress Notes (Signed)
Patient ID: Daniel EconomyRonnie W Arroyo, male   DOB: 07/09/1971, 43 y.o.   MRN: [id number] He has been in bed most of the AM. Up for group at 11:00and has remained up. His self inventory: depression 8, hopelessness 8, anxiety 9, withdrawals of tremors, chilling diarrhea craving, runny nose,  And sedation. SI yes and no, c/o pain and has received prn for the pain. This after noon he was talking with case manager and  Was upset. Say" I am going to leave if you can't help me long term." Parents here now and talking to the case manager. Father stated that they were working on getting him into a long term treatment place in [place].

## 2014-04-23 NOTE — [facility] Group Notes (Signed)
[facility] Group Notes:  (Nursing/MHT/Case Management/Adjunct)  Date:  04/23/2014  Time:  0900 am  Type of Therapy:  Psychoeducational Skills  Participation Level:  Did Not Attend  Cranford MonBeaudry, Raistlin Gum Evans 04/23/2014, 1:58 PM

## 2014-04-23 NOTE — [facility] Group Notes (Signed)
[facility] LCSW Group Therapy 04/23/2014 1:15 PM  Type of Therapy: Group Therapy  Participation Level: Active  Participation Quality: Attentive, Sharing and Supportive  Affect: Depressed and Flat  Cognitive: Alert and Oriented  Insight: Developing/Improving and Engaged  Engagement in Therapy: Developing/Improving and Engaged Modes of Intervention: Clarification, Confrontation, Discussion, Education, Exploration, Limit-setting, Orientation, Problem-solving, Rapport Building, Reality Testing, Socialization and Support  Summary of Progress/Problems: Patient actively listened to Mental [employer] Association guest speaker.   Icelyn Navarrete, MSW, LCSWA Clinical Social Worker [facility] [phone]   
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
## 2014-04-24 DIAGNOSIS — F321 Major depressive disorder, single episode, moderate: Secondary | ICD-10-CM

## 2014-04-24 MED ORDER — DIVALPROEX SODIUM ER 250 MG PO TB24
250.0000 mg | ORAL_TABLET | Freq: Three times a day (TID) | ORAL | Status: DC
  Administered 2014-04-24 – 2014-04-25 (×4): 250 mg via ORAL
  Filled 2014-04-24: qty 1
  Filled 2014-04-24: qty 42
  Filled 2014-04-24 (×3): qty 1
  Filled 2014-04-24: qty 42
  Filled 2014-04-24 (×3): qty 1
  Filled 2014-04-24: qty 42

## 2014-04-24 MED ORDER — ENSURE COMPLETE PO LIQD
237.0000 mL | Freq: Two times a day (BID) | ORAL | Status: DC
  Administered 2014-04-24 – 2014-04-25 (×2): 237 mL via ORAL

## 2014-04-24 MED ORDER — PANTOPRAZOLE SODIUM 40 MG PO TBEC
40.0000 mg | DELAYED_RELEASE_TABLET | Freq: Every day | ORAL | Status: DC
  Administered 2014-04-24 – 2014-04-25 (×2): 40 mg via ORAL
  Filled 2014-04-24 (×5): qty 1

## 2014-04-24 MED ORDER — MIRTAZAPINE 30 MG PO TABS
30.0000 mg | ORAL_TABLET | Freq: Every day | ORAL | Status: DC
  Administered 2014-04-24: 30 mg via ORAL
  Filled 2014-04-24 (×2): qty 1
  Filled 2014-04-24: qty 14

## 2014-04-24 MED ORDER — GABAPENTIN 100 MG PO CAPS
100.0000 mg | ORAL_CAPSULE | Freq: Three times a day (TID) | ORAL | Status: DC
  Administered 2014-04-24 – 2014-04-25 (×4): 100 mg via ORAL
  Filled 2014-04-24 (×3): qty 1
  Filled 2014-04-24: qty 42
  Filled 2014-04-24 (×3): qty 1
  Filled 2014-04-24 (×2): qty 42
  Filled 2014-04-24: qty 1

## 2014-04-24 MED ORDER — ADULT MULTIVITAMIN W/MINERALS CH
1.0000 | ORAL_TABLET | Freq: Every day | ORAL | Status: DC
  Administered 2014-04-24 – 2014-04-25 (×2): 1 via ORAL
  Filled 2014-04-24 (×4): qty 1

## 2014-04-24 NOTE — Progress Notes (Signed)
Pt laying in bed resting with eyes closed. Respirations even and unlabored. No distress noted.  

## 2014-04-24 NOTE — Progress Notes (Signed)
NUTRITION ASSESSMENT  Pt identified as at risk on the Malnutrition Screen Tool  INTERVENTION: 1. Educated patient on the importance of nutrition and encouraged intake of food and beverages. 2. Multivitamin 1 tablet PO daily  3. Supplements: Ensure Complete BID   NUTRITION DIAGNOSIS: Unintentional weight loss related to sub-optimal intake as evidenced by pt report.   Goal: Pt to meet >/= 90% of their estimated nutrition needs.  Monitor:  PO intake  Assessment:  Admitted with alcohol relapse. States he is drinking 12-18 drinks a day almost every day, cocaine every other day, crack or IV.  - Met with pt who reports not eating anything for several weeks due to drinking - Said he lost 35 pounds unintentionally during this time period - Has been eating 100% of meals since admission - Eager to get Ensure Complete here and after d/c    43 y.o. male  Height: Ht Readings from Last 1 Encounters:  04/22/14 6' (1.829 m)    Weight: Wt Readings from Last 1 Encounters:  04/22/14 157 lb (71.215 kg)    Weight Hx: Wt Readings from Last 10 Encounters:  04/22/14 157 lb (71.215 kg)  12/14/13 152 lb (68.947 kg)  12/13/13 155 lb (70.308 kg)  11/12/13 170 lb (77.111 kg)    BMI:  Body mass index is 21.29 kg/(m^2). Pt meets criteria for normal weight based on current BMI.  Estimated Nutritional Needs: Kcal: 25-30 kcal/kg Protein: > 1 gram protein/kg Fluid: 1 ml/kcal  Diet Order: General Pt is also offered choice of unit snacks mid-morning and mid-afternoon.  Pt is eating as desired.   Lab results and medications reviewed. Potassium slightly low.   Carlis Stable MS, Francis, LDN [phone] Pager [phone] Weekend/After Hours Pager

## 2014-04-24 NOTE — [facility] Group Notes (Signed)
Adult Psychoeducational Group Note  Date:  04/24/2014 Time:  9:58 PM  Group Topic/Focus:  AA Meeting  Participation Level:  Minimal  Participation Quality:  Attentive  Affect:  Flat  Cognitive:  Appropriate  Insight: Good  Engagement in Group:  Limited  Modes of Intervention:  Discussion and Education  Additional Comments:  Daniel Arroyo was attentive during group.  He read one of the handouts but didn't do much participation after that.  [name], Daniel Arroyo 04/24/2014, 9:58 PM

## 2014-04-24 NOTE — Discharge Instructions (Signed)
Nutrition Post Hospital Stay Proper nutrition can help your body recover from illness and injury.   Foods and beverages high in protein, vitamins, and minerals help rebuild muscle loss, promote healing, & reduce fall risk.   .In addition to eating healthy foods, a nutrition shake is an easy, delicious way to get the nutrition you need during and after your hospital stay  It is recommended that you continue to drink 2 bottles per day of:       Ensure Complete for at least 1 month (30 days) after your hospital stay   Tips for adding a nutrition shake into your routine: As allowed, drink one with vitamins or medications instead of water or juice Enjoy one as a tasty mid-morning or afternoon snack Drink cold or make a milkshake out of it Drink one instead of milk with cereal or snacks Use as a coffee creamer   Available at the following grocery stores and pharmacies:           * [employer] * [employer] * [employer]co  * Rite Aid          * Walmart * Sam's Club  * Walgreens      * Target  * BJ's   * CVS  * Lowes Foods   * [facility] [phone]            For COUPONS visit: www.ensure.com/join or www.[email or web address]/[email or web address]/sign-[email or web address]   Suggested Substitutions Ensure Plus = Boost Plus = Carnation Breakfast Essentials = Boost Compact Ensure Active Clear = Boost Breeze Glucerna Shake = Boost Glucose Control = Carnation Breakfast Essentials SUGAR FREE     

## 2014-04-24 NOTE — [facility] Group Notes (Signed)
[facility]   04/24/2014  9:41 AM   Participation Quality: Alert, Appropriate and Oriented  Mood/Affect: Depressed and Flat  Depression Rating: 5  Anxiety Rating: 7  Thoughts of Suicide: Pt denies SI/HI  Will you contract for safety? Yes  Current AVH: Pt denies  Plan for Discharge/Comments: Pt attended discharge planning group and actively participated in group. CSW provided pt with today's workbook. Patient reports that he feels "good" today and credits it to sleep, medication, and eating. He reports that his plan is either to go [place] [facility] [place], set up by his family, or to go to [facility] on 04/29/14.   Transportation Means: Pt reports access to transportation  Supports: No supports mentioned at this time  Samuella BruinKristin Elishua Radford, MSW, [employer]CSWA Clinical Social Worker [facility] [phone]

## 2014-04-24 NOTE — Progress Notes (Signed)
D: Patient denies SI/HI and A/V hallucinations; patient reports anxiety  A: Monitored q 15 minutes; patient encouraged to attend groups; patient educated about medications; patient given medications per physician orders; patient encouraged to express feelings and/or concerns  R: Patient has relief of anxiety; patient is cooperative; patient is considering going to some aftercare places; patient's interaction with staff and peers is appropriate; patient was able to set goal to talk with staff 1:1 when having feelings of SI; patient is taking medications as prescribed and tolerating medications

## 2014-04-24 NOTE — B[facility] Group Notes (Signed)
[facility] LCSW Group Therapy 04/24/2014  1:15 PM   Type of Therapy: Group Therapy  Participation Level: Did Not Attend.   Joseangel Nettleton, MSW, LCSWA Clinical Social Worker [facility] [phone]   

## 2014-04-24 NOTE — Progress Notes (Signed)
[facility] MD Progress Note  04/24/2014 3:26 PM Daniel Arroyo  MRN:  [id number] Subjective:  Daniel Arroyo states he has a bed at [facility] on Monday. His family wants him to go to [facility] in [place]. He is concerned about going to a program where they do not allow medications as he states he did the best when he was on medications and went to meetings etc. He wants to be placed back on them as they help keep the mood even and the anxiety under control Diagnosis:   DSM5: Substance/Addictive Disorders:  Alcohol Related Disorder - Severe (303.90), Cocaine related disorder Depressive Disorders:  Major Depressive Disorder - Moderate (296.22) Total Time spent with patient: 30 minutes  Axis I: Substance Induced Mood Disorder  ADL's:  Intact  Sleep: Fair  Appetite:  Fair Psychiatric Specialty Exam: Physical Exam  Review of Systems  Constitutional: Positive for malaise/fatigue.  HENT: Negative.   Eyes: Negative.   Respiratory: Negative.   Cardiovascular: Negative.   Gastrointestinal: Negative.   Genitourinary: Negative.   Musculoskeletal: Negative.   Skin: Negative.   Neurological: Negative.   Endo/Heme/Allergies: Negative.   Psychiatric/Behavioral: Positive for depression and substance abuse. The patient is nervous/anxious.     Blood pressure 121/79, pulse 81, temperature 97.6 F (36.4 C), temperature source Oral, resp. rate 18, height 6' (1.829 m), weight 71.215 kg (157 lb), SpO2 99.00%.Body mass index is 21.29 kg/(m^2).  General Appearance: Fairly Groomed  Patent attorney::  Fair  Speech:  Clear and Coherent  Volume:  Decreased  Mood:  Anxious and worried  Affect:  Restricted  Thought Process:  Coherent and Goal Directed  Orientation:  Full (Time, Place, and Person)  Thought Content:  events, symtpoms worries concerns  Suicidal Thoughts:  No  Homicidal Thoughts:  No  Memory:  Immediate;   Fair Recent;   Fair Remote;   Fair  Judgement:  Fair  Insight:  Present  Psychomotor  Activity:  Restlessness  Concentration:  Fair  Recall:  [employer] of Knowledge:NA  Language: Fair  Akathisia:  No  Handed:    AIMS (if indicated):     Assets:  Desire for Improvement  Sleep:  Number of Hours: 6.75   Musculoskeletal: Strength & Muscle Tone: within normal limits Gait & Station: normal Patient leans: N/A  Current Medications: Current Facility-Administered Medications  Medication Dose Route Frequency Provider Last Rate Last Dose  . acetaminophen (TYLENOL) tablet 650 mg  650 mg Oral Q4H PRN Beau Fanny, FNP      . alum & mag hydroxide-simeth (MAALOX/MYLANTA) 200-200-20 MG/5ML suspension 30 mL  30 mL Oral PRN Beau Fanny, FNP      . cloNIDine (CATAPRES) tablet 0.1 mg  0.1 mg Oral BID Kerry Hough, PA-C   0.1 mg at 04/24/14 0800  . divalproex (DEPAKOTE ER) 24 hr tablet 250 mg  250 mg Oral TID Rachael Fee, MD   250 mg at 04/24/14 1323  . feeding supplement (ENSURE COMPLETE) (ENSURE COMPLETE) liquid 237 mL  237 mL Oral BID BM Tenny Craw, RD      . gabapentin (NEURONTIN) capsule 100 mg  100 mg Oral TID Rachael Fee, MD   100 mg at 04/24/14 1323  . hydrOXYzine (ATARAX/VISTARIL) tablet 25 mg  25 mg Oral Q6H PRN Beau Fanny, FNP   25 mg at 04/24/14 1325  . ibuprofen (ADVIL,MOTRIN) tablet 600 mg  600 mg Oral Q8H PRN Beau Fanny, FNP   600 mg at  04/23/14 1316  . LORazepam (ATIVAN) tablet 0-4 mg  0-4 mg Oral 4 times per day Beau FannyJohn C Withrow, FNP   1 mg at 04/24/14 16100634   Followed by  . LORazepam (ATIVAN) tablet 0-4 mg  0-4 mg Oral Q12H [name] Withrow, FNP      . magnesium hydroxide (MILK OF MAGNESIA) suspension 30 mL  30 mL Oral Daily PRN Beau FannyJohn C Withrow, FNP      . mirtazapine (REMERON) tablet 30 mg  30 mg Oral QHS Rachael FeeIrving A Kealy Lewter, MD      . multivitamin with minerals tablet 1 tablet  1 tablet Oral Daily Tenny CrawHeather S Winkler, RD      . nicotine (NICODERM CQ - dosed in mg/24 hours) patch 21 mg  21 mg Transdermal Daily Beau FannyJohn C Withrow, FNP   21 mg at 04/24/14 0800   . ondansetron (ZOFRAN) tablet 4 mg  4 mg Oral Q8H PRN Beau FannyJohn C Withrow, FNP      . pantoprazole (PROTONIX) EC tablet 40 mg  40 mg Oral Daily Rachael FeeIrving A Nadalie Laughner, MD   40 mg at 04/24/14 1323  . traZODone (DESYREL) tablet 50 mg  50 mg Oral QHS PRN Beau FannyJohn C Withrow, FNP   50 mg at 04/22/14 2219    Lab Results: No results found for this or any previous visit (from the past 48 hour(s)).  Physical Findings: AIMS: Facial and Oral Movements Muscles of Facial Expression: None, normal Lips and Perioral Area: None, normal Jaw: None, normal Tongue: None, normal,Extremity Movements Upper (arms, wrists, hands, fingers): None, normal Lower (legs, knees, ankles, toes): None, normal, Trunk Movements Neck, shoulders, hips: None, normal, Overall Severity Severity of abnormal movements (highest score from questions above): None, normal Incapacitation due to abnormal movements: None, normal Patient's awareness of abnormal movements (rate only patient's report): No Awareness, Dental Status Current problems with teeth and/or dentures?: No Does patient usually wear dentures?: No  CIWA:  CIWA-Ar Total: 0 COWS:     Treatment Plan Summary: Daily contact with patient to assess and evaluate symptoms and progress in treatment Medication management  Plan: Supportive approach/coping skills/relapse prevention           Complete the detox            Resume the Depakote/Neurontin/Remeron combination  Medical Decision Making Problem Points:  Review of psycho-social stressors (1) Data Points:  Review of medication regiment & side effects (2)  I certify that inpatient services furnished can reasonably be expected to improve the patient's condition.   Ashrita Chrismer A 04/24/2014, 3:26 PM

## 2014-04-25 DIAGNOSIS — F1020 Alcohol dependence, uncomplicated: Principal | ICD-10-CM

## 2014-04-25 DIAGNOSIS — F332 Major depressive disorder, recurrent severe without psychotic features: Secondary | ICD-10-CM

## 2014-04-25 MED ORDER — TRAZODONE HCL 50 MG PO TABS: 50.0000 mg | ORAL_TABLET | Freq: Every evening | ORAL | Status: DC | PRN

## 2014-04-25 MED ORDER — MIRTAZAPINE 30 MG PO TABS: 30.0000 mg | ORAL_TABLET | Freq: Every day | ORAL | Status: DC

## 2014-04-25 MED ORDER — HYDROXYZINE HCL 25 MG PO TABS: ORAL_TABLET | ORAL | Status: DC

## 2014-04-25 MED ORDER — GABAPENTIN 100 MG PO CAPS: 100.0000 mg | ORAL_CAPSULE | Freq: Three times a day (TID) | ORAL | Status: DC

## 2014-04-25 MED ORDER — DIVALPROEX SODIUM ER 250 MG PO TB24: 250.0000 mg | ORAL_TABLET | Freq: Three times a day (TID) | ORAL | Status: DC

## 2014-04-25 MED ORDER — CLONIDINE HCL 0.1 MG PO TABS: 0.1000 mg | ORAL_TABLET | Freq: Two times a day (BID) | ORAL | Status: DC

## 2014-04-25 NOTE — B[facility] Suicide Risk Assessment (Signed)
   Demographic Factors:  Male, Caucasian and Unemployed  Total Time spent with patient: 20 minutes  Psychiatric Specialty Exam: Physical Exam  ROS  Blood pressure 136/86, pulse 77, temperature 97.5 F (36.4 C), temperature source Oral, resp. rate 18, height 6' (1.829 m), weight 71.215 kg (157 lb), SpO2 99.00%.Body mass index is 21.29 kg/(m^2).  General Appearance: Fairly Groomed  Patent attorneyye Contact::  Fair  Speech:  Normal Rate  Volume:  Normal  Mood:  Euthymic  Affect:  Appropriate, Congruent and Full Range  Thought Process:  Coherent, Goal Directed and Logical  Orientation:  Full (Time, Place, and Person)  Thought Content:  Negative  Suicidal Thoughts:  No  Homicidal Thoughts:  No  Memory:  Negative  Judgement:  Fair  Insight:  Fair  Psychomotor Activity:  Normal  Concentration:  Fair  Recall:  [employer]Fair  Fund of Knowledge:Fair  Language: Fair  Akathisia:  Negative  Handed:  Right  AIMS (if indicated):     Assets:  Communication Skills Desire for Improvement Housing Physical Health Social Support  Sleep:  Number of Hours: 6.25    Musculoskeletal: Strength & Muscle Tone: within normal limits Gait & Station: normal Patient leans: N/A   Mental Status Per Nursing Assessment::   On Admission:  Suicidal ideation indicated by patient;Self-harm thoughts;Self-harm behaviors  Current Mental Status by Physician: Pt was interviewed. Chart reviewed. Pt was admitted for alcohol/cocaine detox. Pt denies current withdrawal symptoms. Pt reports being "excited" about discharge today, and making "positive steps forward". No cravings for alcohol/cocaine. Sleep/appetite good. Pt denies side effects of meds. Pt denies SI/HI/AVH.  Loss Factors: NA  Historical Factors: Family history of mental illness or substance abuse and Impulsivity  Risk Reduction Factors:   Sense of responsibility to family, Living with another person, especially a relative, Positive social support and Positive  therapeutic relationship  Continued Clinical Symptoms:  Depression:   Comorbid alcohol abuse/dependence Alcohol/Substance Abuse/Dependencies  Cognitive Features That Contribute To Risk:  Loss of executive function    Suicide Risk:  Mild:  Suicidal ideation of limited frequency, intensity, duration, and specificity.  There are no identifiable plans, no associated intent, mild dysphoria and related symptoms, good self-control (both objective and subjective assessment), few other risk factors, and identifiable protective factors, including available and accessible social support.  Discharge Diagnoses:   AXIS I:  Alcohol Abuse, Major Depression, Recurrent severe and Substance Abuse AXIS II:  Deferred AXIS III:   Past Medical History  Diagnosis Date  . Knee pain   . Gastroesophageal reflux disease   . Polysubstance abuse   . Bipolar disorder   . Anxiety and depression   . ADHD, adult residual type   . Depression   . Anxiety    AXIS IV:  other psychosocial or environmental problems AXIS V:  51-60 moderate symptoms  Plan Of Care/Follow-up recommendations:  Activity:  as tolerated Diet:  regular Tests:  per PCP Other:  [facility]. Pt reports readiness for discharge. Pt will live with family until he [facility].  Is patient on multiple antipsychotic therapies at discharge:  No   Has Patient had three or more failed trials of antipsychotic monotherapy by history:  No  Recommended Plan for Multiple Antipsychotic Therapies: NA    Carynn Felling 04/25/2014, 12:13 PM

## 2014-04-25 NOTE — Progress Notes (Signed)
Pt is flat in affect and anxious in mood. Pt is calm and cooperative with his treatment. Pt is currently denying any SI/HI/AVH. Pt reports that he is doing better than what he was. He plans to have a positive outlook towards things in his life. Pt actively participates within the milieu. Pt approprietly interacts with staff and the other patients.  A: Writer administered scheduled and prn medications to pt. Continued support and availability as needed was extended to this pt. Staff continue to monitor pt with q6415min checks.  R: No adverse drug reactions noted. Pt receptive to treatment. Pt remains safe at this time.

## 2014-04-25 NOTE — Discharge Summary (Signed)
Physician Discharge Summary Note  Patient:  Daniel Arroyo is an 43 y.o., male MRN:  [id number] DOB:  [birth year] Patient phone:  [phone] (home)  Patient address:   [address]  [place] [address],  Total Time spent with patient: Greater than 30 minutes  Date of Admission:  04/22/2014 Date of Discharge: 04/25/14  Reason for Admission: Alcohol detox  Discharge Diagnoses: Active Problems:   Alcohol dependence   MDD (major depressive disorder)   Cocaine dependence   Substance induced mood disorder   Psychiatric Specialty Exam: Physical Exam  Psychiatric: His speech is normal and behavior is normal. Judgment normal. His mood appears not anxious. His affect is not angry, not blunt, not labile and not inappropriate. Cognition and memory are normal. He does not exhibit a depressed mood.    Review of Systems  Constitutional: Negative.   HENT: Negative.   Eyes: Negative.   Respiratory: Negative.   Cardiovascular: Negative.   Gastrointestinal: Negative.   Genitourinary: Negative.   Musculoskeletal: Negative.   Skin: Negative.   Neurological: Negative.   Endo/Heme/Allergies: Negative.   Psychiatric/Behavioral: Positive for depression (Stable) and substance abuse (Hx. Alcoholism, Cocaine dependence). Negative for suicidal ideas, hallucinations and memory loss. The patient has insomnia (Stable). The patient is not nervous/anxious.     Blood pressure 136/86, pulse 77, temperature 97.5 F (36.4 C), temperature source Oral, resp. rate 18, height 6' (1.829 m), weight 71.215 kg (157 lb), SpO2 99.00%.Body mass index is 21.29 kg/(m^2).   General Appearance: Fairly Groomed   Patent attorney:: Fair   Speech: Normal Rate   Volume: Normal   Mood: Euthymic   Affect: Appropriate, Congruent and Full Range   Thought Process: Coherent, Goal Directed and Logical   Orientation: Full (Time, Place, and Person)   Thought Content: Negative   Suicidal Thoughts: No   Homicidal Thoughts:  No   Memory: Negative   Judgement: Fair   Insight: Fair   Psychomotor Activity: Normal   Concentration: Fair   Recall: [employer] of Knowledge:Fair   Language: Fair   Akathisia: Negative   Handed: Right   AIMS (if indicated):   Assets: Communication Skills  Desire for Improvement  Housing  Physical Health  Social Support   Sleep: Number of Hours: 6.25      Past Psychiatric History: Diagnosis: Alcohol dependence, Major depressive disorder, recurrent episodes  Hospitalizations: [facility] adult unit  Outpatient Care: [facility]  Substance Abuse Care: [facility] on 04/29/14  Self-Mutilation: NA  Suicidal Attempts: NA  Violent Behaviors: NA   Musculoskeletal: Strength & Muscle Tone: within normal limits Gait & Station: normal Patient leans: N/A  DSM5: Schizophrenia Disorders:  NA Obsessive-Compulsive Disorders:  NA Trauma-Stressor Disorders:  NA Substance/Addictive Disorders:  Alcohol Related Disorder - Severe (303.90) Depressive Disorders:  Major Depressive Disorder - Moderate (296.22)  Axis Diagnosis:  AXIS I:  Alcohol dependence, Major depressive disorder, recurrent episodes AXIS II:  Deferred AXIS III:   Past Medical History  Diagnosis Date  . Knee pain   . Gastroesophageal reflux disease   . Polysubstance abuse   . Bipolar disorder   . Anxiety and depression   . ADHD, adult residual type   . Depression   . Anxiety    AXIS IV:  other psychosocial or environmental problems and Alcoholism, chronic, Cocaine dependence AXIS V:  62  Level of Care:  [facility]  Hospital Course:  43 Y/o male who states he went to [facility] on April 1 after he left this unit  and staid 30 days. States he went to friends house. States he was able to abstain for 21 days. States he stop going to meetings. Ran out of medications, did not follow up. He relapsed.   Daniel Arroyo was admitted to the hospital this time around with a blood alcohol level of 86 per toxicology test reports and his UDS test  reports indicated positive cocaine. He required and received detoxification treatments. Besides the detox treatments, Daniel Arroyo also was medicated and discharged on Depakote ER 250 mg three times daily for mood stabilization, Neurontin 100 mg three times daily for substance withdrawal syndrome, Remeron 30 mg Q bedtime for depression/sleep and Trazodone 50 mg Q bedtime for sleep. He enrolled and participated in the group counseling sessions and AA/NA meetings being offered and held on this unit. He learned coping skills. Daniel Arroyo received and was discharged on other pertinent medication for his other medical issues. He tolerated his treatment regimen without any significant adverse effects and or reactions.  Daniel Arroyo has completed detox treatments and his mood is stable. He is currently being discharged to continue substance abuse treatment at the [facility] residential treatment center on 04/29/14. He is provided with all the necessary information required to make this appointment without problems. Upon discharge, he adamantly denies any SIHI, AVH, delusional thoughts, paranoia and or withdrawal symptoms. He received from the [facility] pharmacy, a 14 days worth, supply samples of his [facility] discharge medications. He left [facility] with all personal belongings in no apparent distress. Transportation per parents.  Consults:  psychiatry  Significant Diagnostic Studies:  labs: CBC with diff, CMP, UDS, toxicology tests, U/A  Discharge Vitals:   Blood pressure 136/86, pulse 77, temperature 97.5 F (36.4 C), temperature source Oral, resp. rate 18, height 6' (1.829 m), weight 71.215 kg (157 lb), SpO2 99.00%. Body mass index is 21.29 kg/(m^2). Lab Results:   No results found for this or any previous visit (from the past 72 hour(s)).  Physical Findings: AIMS: Facial and Oral Movements Muscles of Facial Expression: None, normal Lips and Perioral Area: None, normal Jaw: None, normal Tongue: None, normal,Extremity Movements Upper  (arms, wrists, hands, fingers): None, normal Lower (legs, knees, ankles, toes): None, normal, Trunk Movements Neck, shoulders, hips: None, normal, Overall Severity Severity of abnormal movements (highest score from questions above): None, normal Incapacitation due to abnormal movements: None, normal Patient's awareness of abnormal movements (rate only patient's report): No Awareness, Dental Status Current problems with teeth and/or dentures?: No Does patient usually wear dentures?: No  CIWA:  CIWA-Ar Total: 0 COWS:     Psychiatric Specialty Exam: See Psychiatric Specialty Exam and Suicide Risk Assessment completed by Attending Physician prior to discharge.  Discharge destination:  Other:  Home, then to [facility] on 04/29/14  Is patient on multiple antipsychotic therapies at discharge:  No   Has Patient had three or more failed trials of antipsychotic monotherapy by history:  No  Recommended Plan for Multiple Antipsychotic Therapies: NA    Medication List       Indication   cloNIDine 0.1 MG tablet  Commonly known as:  CATAPRES  Take 1 tablet (0.1 mg total) by mouth 2 (two) times daily. For high blood pressure   Indication:  High Blood Pressure     divalproex 250 MG 24 hr tablet  Commonly known as:  DEPAKOTE ER  Take 1 tablet (250 mg total) by mouth 3 (three) times daily. For mood stabilization   Indication:  Mood stabilization     gabapentin 100 MG capsule  Commonly known as:  NEURONTIN  Take 1 capsule (100 mg total) by mouth 3 (three) times daily. For substance withdrawal syndrome   Indication:  Substance withdrawal syndrome     hydrOXYzine 25 MG tablet  Commonly known as:  ATARAX/VISTARIL  Take 1 tablet (25 mg ) three times daily as needed: For anxiety   Indication:  Tension, Anxiety     mirtazapine 30 MG tablet  Commonly known as:  REMERON  Take 1 tablet (30 mg total) by mouth at bedtime. For depression   Indication:  Major Depressive Disorder      traZODone 50 MG tablet  Commonly known as:  DESYREL  Take 1 tablet (50 mg total) by mouth at bedtime as needed for sleep.   Indication:  Trouble Sleeping       Follow-up Information   Follow [facility] with [facility] On 04/29/2014. (Please attend the following appointment on this date at 8 am for assessment. Please bring with you a photo ID, copy of insurance card, medications, and clothing. Please call to reschedule if needed.)    Contact information:   [address]  [place], [place] [address] [phone]     Follow-up recommendations: Activity:  As tolerated Diet: As recommended by your primary care doctor. Keep all scheduled follow-up appointments as recommended.   Comments:  Take all your medications as prescribed by your mental healthcare provider. Report any adverse effects and or reactions from your medicines to your outpatient provider promptly. Patient is instructed and cautioned to not engage in alcohol and or illegal drug use while on prescription medicines. In the event of worsening symptoms, patient is instructed to call the crisis hotline, 911 and or go to the nearest ED for appropriate evaluation and treatment of symptoms. Follow-up with your primary care provider for your other medical issues, concerns and or health care needs.   Total Discharge Time:  Greater than 30 minutes.  Signed: Armandina StammerNwoko, Agnes I, PMHNP-BC 04/26/2014, 9:58 AM

## 2014-04-25 NOTE — Progress Notes (Signed)
Patient ID: Daniel EconomyRonnie W Arroyo, male   DOB: [birth year], 43 y.o.   MRN: [id number] He has been discharged home. [name] " that his parents had left him a car to drive home". He voiced understanding of discharge instruction and of the follow up plan. [name]" he was going to stay at  parents home till Monday". He denies SI thoughts and all his belongings were taken home with him.

## 2014-04-25 NOTE — Progress Notes (Signed)
[facility] :  Will you be returning to the same living situation after discharge: Yes,  with parents until intake on Monday At discharge, do you have transportation home?:Yes,  by parents Do you have the ability to pay for your medications:Yes,  no barriers  Release of information consent forms completed and submitted to medical records by CSW.  Patient to Follow up at: Follow-up Information   [facility] On 04/29/2014. (Please attend the following appointment on this date at 8 am for assessment. Please bring with you a photo ID, copy of insurance card, medications, and clothing. Please call to reschedule if needed.)    Contact information:   [address]  [place], [place] [address] [phone]      Patient denies SI/HI:   Yes,  patient denies    Safety Planning and Suicide Prevention discussed:  Yes,  completed by Mellissa KohutKristen D. LCSWA  PICKETT JR, Ekam Besson C 04/25/2014, 10:29 AM

## 2014-04-25 NOTE — [facility] Group Notes (Signed)
[facility]   04/25/2014 9:53 AM  Participation Quality:  Active   Mood/Affect:  Euthymic  Depression Rating:  2   Anxiety Rating:  4  Thoughts of Suicide:  No Will you contract for safety?   Yes  Current AVH:  No  Plan for Discharge/Comments:  Patient reports that he [facility] August 10[facility].   Transportation Means: Patient reports that his parents will transport him upon discharge.  Supports: Parents  GrantPICKETT JR, Shandale Malak C

## 2014-04-29 NOTE — Progress Notes (Signed)
Patient Discharge Instructions:  After Visit Summary (AVS):   Faxed to:  04/29/14 Discharge Summary Note:   Faxed to:  04/29/14 Psychiatric Admission Assessment Note:   Faxed to:  04/29/14 Suicide Risk Assessment - Discharge Assessment:   Faxed to:  04/29/14 Faxed/Sent to the Next Level Care provider:  04/29/14 Fax[facility] to [facility] @ [phone]  Jerelene ReddenSheena E Hampden-[place], 04/29/2014, 2:33 PM

## 2014-05-11 NOTE — Discharge Summary (Signed)
Pt was interviewed. Chart reviewed. Agree with above assessment and plan.  Jazlyn Tippens, MD  

## 2014-05-13 ENCOUNTER — Emergency Department (HOSPITAL_BASED_OUTPATIENT_CLINIC_OR_DEPARTMENT_OTHER)
Admission: EM | Admit: 2014-05-13 | Discharge: 2014-05-13 | Disposition: A | Discharge status: Rehabilitation Facility | Payer: Self-pay | Attending: Emergency Medicine | Admitting: Emergency Medicine

## 2014-05-13 ENCOUNTER — Encounter (HOSPITAL_BASED_OUTPATIENT_CLINIC_OR_DEPARTMENT_OTHER): Payer: Self-pay | Admitting: Emergency Medicine

## 2014-05-13 DIAGNOSIS — F411 Generalized anxiety disorder: Secondary | ICD-10-CM | POA: Insufficient documentation

## 2014-05-13 DIAGNOSIS — L02411 Cutaneous abscess of right axilla: Secondary | ICD-10-CM

## 2014-05-13 DIAGNOSIS — L089 Local infection of the skin and subcutaneous tissue, unspecified: Secondary | ICD-10-CM | POA: Insufficient documentation

## 2014-05-13 DIAGNOSIS — F909 Attention-deficit hyperactivity disorder, unspecified type: Secondary | ICD-10-CM | POA: Insufficient documentation

## 2014-05-13 DIAGNOSIS — Z79899 Other long term (current) drug therapy: Secondary | ICD-10-CM | POA: Insufficient documentation

## 2014-05-13 DIAGNOSIS — Z8719 Personal history of other diseases of the digestive system: Secondary | ICD-10-CM | POA: Insufficient documentation

## 2014-05-13 DIAGNOSIS — F319 Bipolar disorder, unspecified: Secondary | ICD-10-CM | POA: Insufficient documentation

## 2014-05-13 DIAGNOSIS — F17200 Nicotine dependence, unspecified, uncomplicated: Secondary | ICD-10-CM | POA: Insufficient documentation

## 2014-05-13 DIAGNOSIS — IMO0002 Reserved for concepts with insufficient information to code with codable children: Secondary | ICD-10-CM | POA: Insufficient documentation

## 2014-05-13 MED ORDER — CEPHALEXIN 500 MG PO CAPS: 500.0000 mg | ORAL_CAPSULE | Freq: Four times a day (QID) | ORAL | Status: DC

## 2014-05-13 MED ORDER — SULFAMETHOXAZOLE-TRIMETHOPRIM 800-160 MG PO TABS: 1.0000 | ORAL_TABLET | Freq: Two times a day (BID) | ORAL | Status: AC

## 2014-05-13 NOTE — ED Notes (Addendum)
Pt assisted to call [facility] for ride back to rehab center.

## 2014-05-13 NOTE — ED Provider Notes (Signed)
CSN: [id number]     Arrival date & time 05/13/14  1191 History   First MD Initiated Contact with Patient 05/13/14 [phone]     Chief Complaint  Patient presents with  . Recurrent Skin Infections     (Consider location/radiation/quality/duration/timing/severity/associated sxs/prior Treatment) Patient is a 43 y.o. male presenting with abscess. The history is provided by the patient.  Abscess Abscess location: Right axilla. Abscess quality: fluctuance, induration, painful and redness   Abscess quality: not draining   Red streaking: no   Duration:  5 days Progression:  Worsening Pain details:    Quality:  Throbbing   Severity:  Moderate   Timing:  Constant Chronicity:  New Relieved by:  Nothing Exacerbated by: Movement and palpation.   Past Medical History  Diagnosis Date  . Knee pain   . Gastroesophageal reflux disease   . Polysubstance abuse   . Bipolar disorder   . Anxiety and depression   . ADHD, adult residual type   . Depression   . Anxiety    History reviewed. No pertinent past surgical history. History reviewed. No pertinent family history. History  Substance Use Topics  . Smoking status: Current Every Day Smoker -- 1.00 packs/day  . Smokeless tobacco: Never Used  . Alcohol Use: Yes     Comment: daily    Review of Systems  All other systems reviewed and are negative.     Allergies  Review of patient's allergies indicates no known allergies.  Home Medications   Prior to Admission medications   Medication Sig Start Date End Date Taking? Authorizing Provider  cloNIDine (CATAPRES) 0.1 MG tablet Take 1 tablet (0.1 mg total) by mouth 2 (two) times daily. For high blood pressure 04/25/14   Sanjuana Kava, NP  divalproex (DEPAKOTE ER) 250 MG 24 hr tablet Take 1 tablet (250 mg total) by mouth 3 (three) times daily. For mood stabilization 04/25/14   Sanjuana Kava, NP  gabapentin (NEURONTIN) 100 MG capsule Take 1 capsule (100 mg total) by mouth 3 (three) times daily.  For substance withdrawal syndrome 04/25/14   Sanjuana Kava, NP  hydrOXYzine (ATARAX/VISTARIL) 25 MG tablet Take 1 tablet (25 mg ) three times daily as needed: For anxiety 04/25/14   Sanjuana Kava, NP  mirtazapine (REMERON) 30 MG tablet Take 1 tablet (30 mg total) by mouth at bedtime. For depression 04/25/14   Sanjuana Kava, NP  traZODone (DESYREL) 50 MG tablet Take 1 tablet (50 mg total) by mouth at bedtime as needed for sleep. 04/25/14   Sanjuana Kava, NP   BP 150/85  Pulse 78  Temp(Src) 97.7 F (36.5 C) (Oral)  Resp 20  Ht 6' (1.829 m)  Wt 175 lb (79.379 kg)  BMI 23.73 kg/m2  SpO2 100% Physical Exam  Nursing note and vitals reviewed. Constitutional: He is oriented to person, place, and time. He appears well-developed and well-nourished. No distress.  HENT:  Head: Normocephalic and atraumatic.  Neck: Normal range of motion. Neck supple.  Neurological: He is alert and oriented to person, place, and time.  Skin: Skin is warm and dry. He is not diaphoretic.  There are 3 fluctuant, swollen, tender areas in the right axilla. The largest measures 1.5 x 2.5 cm. There is no significant erythema or streaking. There is no drainage.    ED Course  Procedures (including critical care time) Labs Review Labs Reviewed - No data to display  Imaging Review No results found.   EKG Interpretation None  INCISION AND DRAINAGE Performed by: Geoffery Lyons Consent: Verbal consent obtained. Risks and benefits: risks, benefits and alternatives were discussed Type: abscess  Body area: Right axilla  Anesthesia: local infiltration  Incision was made with a scalpel.  Local anesthetic: lidocaine 2 % with epinephrine  Anesthetic total: 3 ml  Complexity: complex Blunt dissection to break up loculations  Drainage: purulent  Drainage amount: Moderate   Packing material: No packing placed   Patient tolerance: Patient tolerated the procedure well with no immediate complications.     MDM    Final diagnoses:  None    Patient presents with 3 abscess there is a right axilla. These were I&D and will be treated with continued warm soaks, Bactrim, and Keflex. He is to return as needed if not improving.    Geoffery Lyons, MD 05/13/14 [id number]

## 2014-05-13 NOTE — ED Notes (Signed)
MD at bedside. Supplies gathered and placed at bedside for md.

## 2014-05-13 NOTE — Discharge Instructions (Signed)
Keflex and Bactrim as prescribed.  Warm soaks as often as possible for the next 2-3 days.  Return to the emergency department if your symptoms substantially worsen or change.   Abscess An abscess is an infected area that contains a collection of pus and debris.It can occur in almost any part of the body. An abscess is also known as a furuncle or boil. CAUSES  An abscess occurs when tissue gets infected. This can occur from blockage of oil or sweat glands, infection of hair follicles, or a minor injury to the skin. As the body tries to fight the infection, pus collects in the area and creates pressure under the skin. This pressure causes pain. People with weakened immune systems have difficulty fighting infections and get certain abscesses more often.  SYMPTOMS Usually an abscess develops on the skin and becomes a painful mass that is red, warm, and tender. If the abscess forms under the skin, you may feel a moveable soft area under the skin. Some abscesses break open (rupture) on their own, but most will continue to get worse without care. The infection can spread deeper into the body and eventually into the bloodstream, causing you to feel ill.  DIAGNOSIS  Your caregiver will take your medical history and perform a physical exam. A sample of fluid may also be taken from the abscess to determine what is causing your infection. TREATMENT  Your caregiver may prescribe antibiotic medicines to fight the infection. However, taking antibiotics alone usually does not cure an abscess. Your caregiver may need to make a small cut (incision) in the abscess to drain the pus. In some cases, gauze is packed into the abscess to reduce pain and to continue draining the area. HOME CARE INSTRUCTIONS   Only take over-the-counter or prescription medicines for pain, discomfort, or fever as directed by your caregiver.  If you were prescribed antibiotics, take them as directed. Finish them even if you start to feel  better.  If gauze is used, follow your caregiver's directions for changing the gauze.  To avoid spreading the infection:  Keep your draining abscess covered with a bandage.  Wash your hands well.  Do not share personal care items, towels, or whirlpools with others.  Avoid skin contact with others.  Keep your skin and clothes clean around the abscess.  Keep all follow-up appointments as directed by your caregiver. SEEK MEDICAL CARE IF:   You have increased pain, swelling, redness, fluid drainage, or bleeding.  You have muscle aches, chills, or a general ill feeling.  You have a fever. MAKE SURE YOU:   Understand these instructions.  Will watch your condition.  Will get help right away if you are not doing well or get worse. Document Released: 06/16/2005 Document Revised: 03/07/2012 Document Reviewed: 11/19/2011 [facility] Patient Information 2015 [place], [place]. This information is not intended to replace advice given to you by your health care provider. Make sure you discuss any questions you have with your health care provider.

## 2014-05-13 NOTE — ED Notes (Signed)
Pt amb to room 6 with quick steady gait in nad. Pt reports "bump" to his right axilla x Tuesday, growing larger, and since then several other smaller "bumps" have come on in the same area. Area is tender, warm to touch and red. Denies any fevers or other c/o.
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
## 2016-06-08 ENCOUNTER — Encounter (HOSPITAL_COMMUNITY): Payer: Self-pay

## 2016-06-08 ENCOUNTER — Emergency Department (HOSPITAL_COMMUNITY)
Admission: EM | Admit: 2016-06-08 | Discharge: 2016-06-08 | Disposition: A | Discharge status: Home / Self Care | Payer: Self-pay | Attending: Emergency Medicine | Admitting: Emergency Medicine

## 2016-06-08 DIAGNOSIS — R45851 Suicidal ideations: Secondary | ICD-10-CM | POA: Insufficient documentation

## 2016-06-08 DIAGNOSIS — F1490 Cocaine use, unspecified, uncomplicated: Secondary | ICD-10-CM | POA: Insufficient documentation

## 2016-06-08 DIAGNOSIS — Z79899 Other long term (current) drug therapy: Secondary | ICD-10-CM | POA: Insufficient documentation

## 2016-06-08 DIAGNOSIS — F17200 Nicotine dependence, unspecified, uncomplicated: Secondary | ICD-10-CM | POA: Insufficient documentation

## 2016-06-08 DIAGNOSIS — F908 Attention-deficit hyperactivity disorder, other type: Secondary | ICD-10-CM | POA: Insufficient documentation

## 2016-06-08 DIAGNOSIS — F1290 Cannabis use, unspecified, uncomplicated: Secondary | ICD-10-CM | POA: Insufficient documentation

## 2016-06-08 DIAGNOSIS — Z5321 Procedure and treatment not carried out due to patient leaving prior to being seen by health care provider: Secondary | ICD-10-CM | POA: Insufficient documentation

## 2016-06-08 DIAGNOSIS — F1110 Opioid abuse, uncomplicated: Secondary | ICD-10-CM | POA: Insufficient documentation

## 2016-06-08 LAB — CBC
HCT: 44.2 % (ref 39.0–52.0)
Hemoglobin: 16.1 g/dL (ref 13.0–17.0)
MCH: 34.8 pg — AB (ref 26.0–34.0)
MCHC: 36.4 g/dL — ABNORMAL HIGH (ref 30.0–36.0)
MCV: 95.5 fL (ref 78.0–100.0)
PLATELETS: 499 10*3/uL — AB (ref 150–400)
RBC: 4.63 MIL/uL (ref 4.22–5.81)
RDW: 14.9 % (ref 11.5–15.5)
WBC: 11.1 10*3/uL — ABNORMAL HIGH (ref 4.0–10.5)

## 2016-06-08 LAB — RAPID URINE DRUG SCREEN, HOSP PERFORMED
AMPHETAMINES: NOT DETECTED
BENZODIAZEPINES: NOT DETECTED
Barbiturates: NOT DETECTED
Cocaine: POSITIVE — AB
OPIATES: NOT DETECTED
Tetrahydrocannabinol: NOT DETECTED

## 2016-06-08 LAB — SALICYLATE LEVEL

## 2016-06-08 LAB — COMPREHENSIVE METABOLIC PANEL
ALT: 28 U/L (ref 17–63)
AST: 32 U/L (ref 15–41)
Albumin: 4.2 g/dL (ref 3.5–5.0)
Alkaline Phosphatase: 82 U/L (ref 38–126)
Anion gap: 9 (ref 5–15)
BUN: 6 mg/dL (ref 6–20)
CO2: 24 mmol/L (ref 22–32)
Calcium: 8.9 mg/dL (ref 8.9–10.3)
Chloride: 109 mmol/L (ref 101–111)
Creatinine, Ser: 0.91 mg/dL (ref 0.61–1.24)
GFR calc non Af Amer: >60 mL/min (ref >60)
Glucose, Bld: 89 mg/dL (ref 65–99)
Potassium: 3.4 mmol/L — ABNORMAL LOW (ref 3.5–5.1)
SODIUM: 142 mmol/L (ref 135–145)
Total Bilirubin: 0.7 mg/dL (ref 0.3–1.2)
Total Protein: 7.3 g/dL (ref 6.5–8.1)

## 2016-06-08 LAB — ETHANOL: Alcohol, Ethyl (B): 130 mg/dL — ABNORMAL HIGH (ref <5)

## 2016-06-08 LAB — ACETAMINOPHEN LEVEL: Acetaminophen (Tylenol), Serum: <10 ug/mL — ABNORMAL LOW (ref 10–30)

## 2016-06-08 MED ORDER — LORAZEPAM 1 MG PO TABS: 2.0000 mg | ORAL_TABLET | Freq: Once | ORAL | Status: DC

## 2016-06-08 NOTE — ED Notes (Signed)
Pt left stating he was fine and is calling his friend to come get him, pt refused to stay and isn't under IVC

## 2016-06-09 ENCOUNTER — Encounter (HOSPITAL_COMMUNITY): Payer: Self-pay | Admitting: Emergency Medicine

## 2016-06-09 ENCOUNTER — Emergency Department (HOSPITAL_COMMUNITY)
Admission: EM | Admit: 2016-06-09 | Discharge: 2016-06-10 | Disposition: A | Discharge status: Other Acute Inpatient Hospital | Payer: Federal, State, Local not specified - Other | Attending: Emergency Medicine | Admitting: Emergency Medicine

## 2016-06-09 DIAGNOSIS — F10939 Alcohol use, unspecified with withdrawal, unspecified: Secondary | ICD-10-CM | POA: Diagnosis present

## 2016-06-09 DIAGNOSIS — F909 Attention-deficit hyperactivity disorder, unspecified type: Secondary | ICD-10-CM | POA: Insufficient documentation

## 2016-06-09 DIAGNOSIS — F329 Major depressive disorder, single episode, unspecified: Secondary | ICD-10-CM | POA: Diagnosis present

## 2016-06-09 DIAGNOSIS — F1420 Cocaine dependence, uncomplicated: Secondary | ICD-10-CM | POA: Diagnosis present

## 2016-06-09 DIAGNOSIS — F17200 Nicotine dependence, unspecified, uncomplicated: Secondary | ICD-10-CM | POA: Insufficient documentation

## 2016-06-09 DIAGNOSIS — F1094 Alcohol use, unspecified with alcohol-induced mood disorder: Secondary | ICD-10-CM | POA: Diagnosis present

## 2016-06-09 DIAGNOSIS — Z79899 Other long term (current) drug therapy: Secondary | ICD-10-CM | POA: Insufficient documentation

## 2016-06-09 DIAGNOSIS — F10239 Alcohol dependence with withdrawal, unspecified: Secondary | ICD-10-CM | POA: Diagnosis present

## 2016-06-09 DIAGNOSIS — F1120 Opioid dependence, uncomplicated: Secondary | ICD-10-CM | POA: Insufficient documentation

## 2016-06-09 DIAGNOSIS — F1020 Alcohol dependence, uncomplicated: Secondary | ICD-10-CM | POA: Diagnosis present

## 2016-06-09 MED ORDER — LORAZEPAM 1 MG PO TABS
0.0000 mg | ORAL_TABLET | Freq: Four times a day (QID) | ORAL | Status: DC
  Administered 2016-06-10 (×2): 1 mg via ORAL
  Filled 2016-06-09: qty 2
  Filled 2016-06-09: qty 1

## 2016-06-09 MED ORDER — LORAZEPAM 1 MG PO TABS: 0.0000 mg | ORAL_TABLET | Freq: Two times a day (BID) | ORAL | Status: DC

## 2016-06-09 NOTE — ED Provider Notes (Signed)
[facility]-EMERGENCY DEPT Provider Note   CSN: [id number] Arrival date & time: 06/09/16  2306  By signing my name below, I, [name], attest that this documentation has been prepared under the direction and in the presence of [employer].  Electronically Signed: [name], ED Scribe. 06/09/16. 11:52 PM.   History   Chief Complaint Chief Complaint  Patient presents with  . Suicidal    HPI HPI Comments: Daniel Arroyo is a 45 y.o. male with a Hx of anxiety and depression, who presents to the Emergency Department complaining of suicidal thoughts for the past 2 weeks. He states that he has had more "detailed" plans of how he would kill himself. He has not been taking his medication because he lost his job. Pt states "when I wake up, everything is in a panic." Pt states he was unable to pay for his medication due to losing his job. He admits to drinking alcohol and taking cocaine this evening. He states that "the alcohol and cocaine drives everything". Pt does not have these suicidal thoughts when he is on his medication. He denies any HI.    The history is provided by the patient. No language interpreter was used.    Past Medical History:  Diagnosis Date  . ADHD, adult residual type   . Anxiety   . Anxiety and depression   . Bipolar disorder (HCC)   . Depression   . Gastroesophageal reflux disease   . Knee pain   . Polysubstance abuse     Patient Active Problem List   Diagnosis Date Noted  . Cocaine dependence (HCC) 04/23/2014  . Substance induced mood disorder (HCC) 04/23/2014  . MDD (major depressive disorder) (HCC) 04/22/2014  . Alcohol dependence (HCC) 12/14/2013  . MDD (major depressive disorder), recurrent episode, severe (HCC) 12/14/2013  . Adult ADHD 12/14/2013  . Polysubstance abuse 12/13/2013  . Alcohol withdrawal (HCC) 10/28/2013    History reviewed. No pertinent surgical history.     Home Medications    Prior to Admission medications   Medication  Sig Start Date End Date Taking? Authorizing Provider  amphetamine-dextroamphetamine (ADDERALL XR) 10 MG 24 hr capsule Take 10 mg by mouth daily.   Yes Historical Provider, MD  cloNIDine (CATAPRES) 0.1 MG tablet Take 1 tablet (0.1 mg total) by mouth 2 (two) times daily. For high blood pressure 04/25/14  Yes Sanjuana Kava, NP  divalproex (DEPAKOTE ER) 250 MG 24 hr tablet Take 1 tablet (250 mg total) by mouth 3 (three) times daily. For mood stabilization 04/25/14  Yes Sanjuana Kava, NP  gabapentin (NEURONTIN) 100 MG capsule Take 1 capsule (100 mg total) by mouth 3 (three) times daily. For substance withdrawal syndrome 04/25/14  Yes Sanjuana Kava, NP  hydrOXYzine (ATARAX/VISTARIL) 25 MG tablet Take 1 tablet (25 mg ) three times daily as needed: For anxiety 04/25/14  Yes Sanjuana Kava, NP  mirtazapine (REMERON) 30 MG tablet Take 1 tablet (30 mg total) by mouth at bedtime. For depression 04/25/14  Yes Sanjuana Kava, NP  cephALEXin (KEFLEX) 500 MG capsule Take 1 capsule (500 mg total) by mouth 4 (four) times daily. Patient not taking: Reported on 06/08/2016 05/13/14   Geoffery Lyons, MD  traZODone (DESYREL) 50 MG tablet Take 1 tablet (50 mg total) by mouth at bedtime as needed for sleep. Patient not taking: Reported on 06/08/2016 04/25/14   Sanjuana Kava, NP    Family History History reviewed. No pertinent family history.  Social History Social History  Substance Use Topics  . Smoking status: Current Every Day Smoker    Packs/day: 1.00  . Smokeless tobacco: Never Used  . Alcohol use Yes     Comment: daily     Allergies   Review of patient's allergies indicates no known allergies.   Review of Systems Review of Systems A complete 10 system review of systems was obtained and all systems are negative except as noted in the HPI and PMH.    Physical Exam Updated Vital Signs BP 149/100   Pulse 87   Temp 98.6 F (37 C) (Oral)   Resp 21   SpO2 93%   Physical Exam  Constitutional: He is oriented to  person, place, and time. He appears well-developed and well-nourished. No distress.  HENT:  Head: Normocephalic and atraumatic.  Eyes: Conjunctivae and EOM are normal. No scleral icterus.  Neck: Normal range of motion.  Pulmonary/Chest: Effort normal. No respiratory distress.  Musculoskeletal: Normal range of motion.  Neurological: He is alert and oriented to person, place, and time.  Skin: Skin is warm and dry. No rash noted. He is not diaphoretic. No erythema. No pallor.  Psychiatric: His mood appears anxious. His speech is rapid and/or pressured. He is agitated (mild). He expresses suicidal ideation. He expresses no homicidal ideation. He expresses suicidal plans. He expresses no homicidal plans.  Nursing note and vitals reviewed.    ED Treatments / Results  DIAGNOSTIC STUDIES: Oxygen Saturation is 97% on RA, normal by my interpretation.  COORDINATION OF CARE: 11:51 PM-Will order medication for anxiety. Discussed treatment plan with pt at bedside and pt agreed to plan.   Labs (all labs ordered are listed, but only abnormal results are displayed) Labs Reviewed  COMPREHENSIVE METABOLIC PANEL - Abnormal; Notable for the following:       Result Value   Potassium 3.2 (*)    CO2 20 (*)    Glucose, Bld 120 (*)    All other components within normal limits  ETHANOL - Abnormal; Notable for the following:    Alcohol, Ethyl (B) 169 (*)    All other components within normal limits  ACETAMINOPHEN LEVEL - Abnormal; Notable for the following:    Acetaminophen (Tylenol), Serum <10 (*)    All other components within normal limits  CBC - Abnormal; Notable for the following:    MCHC 36.7 (*)    All other components within normal limits  URINE RAPID DRUG SCREEN, HOSP PERFORMED - Abnormal; Notable for the following:    Cocaine POSITIVE (*)    All other components within normal limits  SALICYLATE LEVEL    EKG  EKG Interpretation None       Radiology No results  found.  Procedures Procedures (including critical care time)  Medications Ordered in ED Medications  LORazepam (ATIVAN) tablet 0-4 mg (0 mg Oral Not Given 06/10/16 1610)    Followed by  LORazepam (ATIVAN) tablet 0-4 mg (not administered)  nicotine (NICODERM CQ - dosed in mg/24 hours) patch 21 mg (21 mg Transdermal Patch Applied 06/10/16 0208)  potassium chloride SA (K-DUR,KLOR-CON) CR tablet 40 mEq (40 mEq Oral Given 06/10/16 0208)     Initial Impression / Assessment and Plan / ED Course  I have reviewed the triage vital signs and the nursing notes.  Pertinent labs & imaging results that were available during my care of the patient were reviewed by me and considered in my medical decision making (see chart for details).  Clinical Course    Patient medically cleared.  Patient evaluated by TTS. He has been recommended for inpatient management. TTS to seek placement. Disposition to be determined by oncoming ED provider.   Final Clinical Impressions(s) / ED Diagnoses   Final diagnoses:  Alcohol dependence with intoxication with complication (HCC)  Severe episode of recurrent major depressive disorder, without psychotic features (HCC)    New Prescriptions New Prescriptions   No medications on file    I personally performed the services described in this documentation, which was scribed in my presence. The recorded information has been reviewed and is accurate.       Antony MaduraKelly Damonie Ellenwood, PA-C 06/10/16 [id number]    April Palumbo, MD 06/10/16 [phone]

## 2016-06-09 NOTE — ED Triage Notes (Signed)
Pt states that he has felt suicidal for some time now but his thoughts have gotten more 'concrete' towards taken either sleeping pills or 'gathering weapons'. States that he has been drinking heavily for 6 months and he has not been taking his meds due to financial problems. Alert and oriented.

## 2016-06-09 NOTE — ED Notes (Signed)
Bed: WTR8 Expected date:  Expected time:  Means of arrival:  Comments: 

## 2016-06-10 ENCOUNTER — Encounter (HOSPITAL_COMMUNITY): Payer: Self-pay | Admitting: *Deleted

## 2016-06-10 ENCOUNTER — Observation Stay (HOSPITAL_COMMUNITY)
Admission: AD | Admit: 2016-06-10 | Discharge: 2016-06-11 | Disposition: A | Discharge status: Home / Self Care | Payer: No Typology Code available for payment source | Source: Intra-hospital | Attending: Psychiatry | Admitting: Psychiatry

## 2016-06-10 DIAGNOSIS — Z6379 Other stressful life events affecting family and household: Secondary | ICD-10-CM | POA: Insufficient documentation

## 2016-06-10 DIAGNOSIS — F329 Major depressive disorder, single episode, unspecified: Secondary | ICD-10-CM | POA: Diagnosis not present

## 2016-06-10 DIAGNOSIS — R45851 Suicidal ideations: Secondary | ICD-10-CM

## 2016-06-10 DIAGNOSIS — F909 Attention-deficit hyperactivity disorder, unspecified type: Secondary | ICD-10-CM | POA: Insufficient documentation

## 2016-06-10 DIAGNOSIS — F1094 Alcohol use, unspecified with alcohol-induced mood disorder: Principal | ICD-10-CM | POA: Diagnosis present

## 2016-06-10 DIAGNOSIS — F3130 Bipolar disorder, current episode depressed, mild or moderate severity, unspecified: Secondary | ICD-10-CM | POA: Insufficient documentation

## 2016-06-10 DIAGNOSIS — Y906 Blood alcohol level of 120-199 mg/100 ml: Secondary | ICD-10-CM | POA: Insufficient documentation

## 2016-06-10 DIAGNOSIS — F1420 Cocaine dependence, uncomplicated: Secondary | ICD-10-CM | POA: Insufficient documentation

## 2016-06-10 DIAGNOSIS — F1290 Cannabis use, unspecified, uncomplicated: Secondary | ICD-10-CM | POA: Insufficient documentation

## 2016-06-10 DIAGNOSIS — K219 Gastro-esophageal reflux disease without esophagitis: Secondary | ICD-10-CM | POA: Insufficient documentation

## 2016-06-10 DIAGNOSIS — F419 Anxiety disorder, unspecified: Secondary | ICD-10-CM | POA: Insufficient documentation

## 2016-06-10 DIAGNOSIS — F17210 Nicotine dependence, cigarettes, uncomplicated: Secondary | ICD-10-CM | POA: Insufficient documentation

## 2016-06-10 LAB — CBC
HEMATOCRIT: 43.1 % (ref 39.0–52.0)
HEMOGLOBIN: 15.8 g/dL (ref 13.0–17.0)
MCH: 33.9 pg (ref 26.0–34.0)
MCHC: 36.7 g/dL — AB (ref 30.0–36.0)
MCV: 92.5 fL (ref 78.0–100.0)
Platelets: 264 10*3/uL (ref 150–400)
RBC: 4.66 MIL/uL (ref 4.22–5.81)
RDW: 14.7 % (ref 11.5–15.5)
WBC: 10 10*3/uL (ref 4.0–10.5)

## 2016-06-10 LAB — COMPREHENSIVE METABOLIC PANEL
ALBUMIN: 4 g/dL (ref 3.5–5.0)
ALK PHOS: 78 U/L (ref 38–126)
ALT: 25 U/L (ref 17–63)
AST: 37 U/L (ref 15–41)
Anion gap: 11 (ref 5–15)
BILIRUBIN TOTAL: 0.3 mg/dL (ref 0.3–1.2)
BUN: 7 mg/dL (ref 6–20)
CALCIUM: 8.9 mg/dL (ref 8.9–10.3)
CO2: 20 mmol/L — AB (ref 22–32)
CREATININE: 1 mg/dL (ref 0.61–1.24)
Chloride: 107 mmol/L (ref 101–111)
GFR calc non Af Amer: >60 mL/min (ref >60)
GLUCOSE: 120 mg/dL — AB (ref 65–99)
Potassium: 3.2 mmol/L — ABNORMAL LOW (ref 3.5–5.1)
SODIUM: 138 mmol/L (ref 135–145)
Total Protein: 6.7 g/dL (ref 6.5–8.1)

## 2016-06-10 LAB — RAPID URINE DRUG SCREEN, HOSP PERFORMED
Amphetamines: NOT DETECTED
BARBITURATES: NOT DETECTED
Benzodiazepines: NOT DETECTED
Cocaine: POSITIVE — AB
Opiates: NOT DETECTED
Tetrahydrocannabinol: NOT DETECTED

## 2016-06-10 LAB — ACETAMINOPHEN LEVEL: Acetaminophen (Tylenol), Serum: <10 ug/mL — ABNORMAL LOW (ref 10–30)

## 2016-06-10 LAB — SALICYLATE LEVEL: Salicylate Lvl: <4 mg/dL (ref 2.8–30.0)

## 2016-06-10 LAB — ETHANOL: Alcohol, Ethyl (B): 169 mg/dL — ABNORMAL HIGH (ref <5)

## 2016-06-10 MED ORDER — GABAPENTIN 100 MG PO CAPS: 100.0000 mg | ORAL_CAPSULE | Freq: Three times a day (TID) | ORAL | Status: DC

## 2016-06-10 MED ORDER — CLONIDINE HCL 0.1 MG PO TABS
0.1000 mg | ORAL_TABLET | Freq: Two times a day (BID) | ORAL | Status: DC
  Administered 2016-06-10 – 2016-06-11 (×2): 0.1 mg via ORAL
  Filled 2016-06-10: qty 1
  Filled 2016-06-10: qty 14

## 2016-06-10 MED ORDER — MAGNESIUM HYDROXIDE 400 MG/5ML PO SUSP: 30.0000 mL | Freq: Every day | ORAL | Status: DC | PRN

## 2016-06-10 MED ORDER — DIVALPROEX SODIUM ER 250 MG PO TB24: 250.0000 mg | ORAL_TABLET | Freq: Three times a day (TID) | ORAL | Status: DC

## 2016-06-10 MED ORDER — HYDROXYZINE HCL 25 MG PO TABS
25.0000 mg | ORAL_TABLET | Freq: Four times a day (QID) | ORAL | Status: DC | PRN
  Administered 2016-06-10: 25 mg via ORAL
  Filled 2016-06-10: qty 1
  Filled 2016-06-10: qty 10

## 2016-06-10 MED ORDER — DIVALPROEX SODIUM ER 250 MG PO TB24
250.0000 mg | ORAL_TABLET | Freq: Three times a day (TID) | ORAL | Status: DC
  Administered 2016-06-11: 250 mg via ORAL
  Filled 2016-06-10: qty 1

## 2016-06-10 MED ORDER — LORAZEPAM 1 MG PO TABS: 0.0000 mg | ORAL_TABLET | Freq: Two times a day (BID) | ORAL | Status: DC

## 2016-06-10 MED ORDER — CLONIDINE HCL 0.1 MG PO TABS: 0.1000 mg | ORAL_TABLET | Freq: Two times a day (BID) | ORAL | Status: DC

## 2016-06-10 MED ORDER — LORAZEPAM 1 MG PO TABS
0.0000 mg | ORAL_TABLET | Freq: Four times a day (QID) | ORAL | Status: DC
  Administered 2016-06-10: 2 mg via ORAL
  Filled 2016-06-10: qty 2

## 2016-06-10 MED ORDER — ALUM & MAG HYDROXIDE-SIMETH 200-200-20 MG/5ML PO SUSP: 30.0000 mL | ORAL | Status: DC | PRN

## 2016-06-10 MED ORDER — CLONIDINE HCL 0.1 MG PO TABS
0.1000 mg | ORAL_TABLET | Freq: Two times a day (BID) | ORAL | Status: DC
  Filled 2016-06-10 (×2): qty 1

## 2016-06-10 MED ORDER — GABAPENTIN 100 MG PO CAPS
100.0000 mg | ORAL_CAPSULE | Freq: Three times a day (TID) | ORAL | Status: DC
  Administered 2016-06-10 – 2016-06-11 (×4): 100 mg via ORAL
  Filled 2016-06-10: qty 21
  Filled 2016-06-10 (×4): qty 1

## 2016-06-10 MED ORDER — NICOTINE 21 MG/24HR TD PT24
21.0000 mg | MEDICATED_PATCH | Freq: Once | TRANSDERMAL | Status: DC
  Administered 2016-06-10: 21 mg via TRANSDERMAL
  Filled 2016-06-10: qty 1

## 2016-06-10 MED ORDER — POTASSIUM CHLORIDE CRYS ER 20 MEQ PO TBCR
40.0000 meq | EXTENDED_RELEASE_TABLET | Freq: Once | ORAL | Status: AC
  Administered 2016-06-10: 40 meq via ORAL
  Filled 2016-06-10: qty 2

## 2016-06-10 MED ORDER — ACETAMINOPHEN 325 MG PO TABS: 650.0000 mg | ORAL_TABLET | Freq: Four times a day (QID) | ORAL | Status: DC | PRN

## 2016-06-10 MED ORDER — DIVALPROEX SODIUM ER 250 MG PO TB24
250.0000 mg | ORAL_TABLET | Freq: Three times a day (TID) | ORAL | Status: DC
  Administered 2016-06-10 – 2016-06-11 (×3): 250 mg via ORAL
  Filled 2016-06-10 (×2): qty 1
  Filled 2016-06-10: qty 21
  Filled 2016-06-10: qty 1

## 2016-06-10 NOTE — Progress Notes (Signed)
This Clinical research associatewriter spoke with patient about services once he is discharged from the observation unit. Patient [facility]. This Clinical research associatewriter called [facility] and spoke with Clifton Custardaron, and was told that there are no male beds at this time. Per patients request, he will be put on a waiting list and they will call him once a bed is available.  Patient states that he will go to SA meetings until he is called.

## 2016-06-10 NOTE — Progress Notes (Signed)
CM spoke with pt who confirms uninsured [place] resident with no pcp.  CM discussed and provided written information to assist pt with determining choice for uninsured accepting pcps, discussed the importance of pcp vs EDP services for f/u care, www.needymeds.org, www.goodrx.com, discounted pharmacies and other [place] resources such as [facility] , [facility], affordable care act, financial assistance, uninsured dental services, [place] med assist, DSS and  health department  Reviewed resources for [place] uninsured accepting pcps like [facility], [facility] [address], [facility], [facility], local urgent care centers, [facility], [facility], general medical clinics, [facility], [facility] plus others, medication resources, [facility] out patient [facility]ies and housing Pt voiced understanding and appreciation of resources provided   Provided [facility] contact information Pt agreed to a referral Cm completed referral Pt to be contact by [facility] clinical liaison  

## 2016-06-10 NOTE — BH Assessment (Signed)
Tele Assessment Note   Daniel Arroyo is an 45 y.o. male who presents to the ED voluntarily. Pt reports that he has been feeling suicidal and states the loss of his jobs and financial issues are recent stressors for him. Pt reports he lost his job about 6 or 8 weeks ago and his suicidal thoughts are getting worse. Pt denies an active plan. The pt reports he is on the verge of losing his housing due to financial strains and he feeling suicidal and depressed. Pt endorses feelings of worthlessness and isolating himself. Pt reports he "either sleeps for a long time and does not want to get out of the bed or cannot get to sleep at all." Pt reports to excessive drinking and using drugs to ease his depressed feelings, however he reports the drugs and alcohol make the feelings worse. Pt reports he has access to weapons and states "yeah I can get guns." Pt reports to feeling guilty and engaging in risky behaviors such as drinking while intoxicated.  Pt reports he has an upcoming court date for a DUI. Pt reports to consuming alcohol and drugs daily. Pt reports he has received inpatient therapy multiple times in the past for suicidal thoughts and depression. Pt reports he has also received OPT but he cannot recall when or where he received the treatment.    Per Maryjean Morn, PA pt meets inpt criteria. Frances Maywood, RN has been notified. Placement is pending medical clearance per Binnie Rail, [facility] reports pt's BP and potassium levels are abnormal.   Diagnosis: Alcohol Dependency; Major Depressive Disorder   Past Medical History:  Past Medical History:  Diagnosis Date   ADHD, adult residual type    Anxiety    Anxiety and depression    Bipolar disorder (HCC)    Depression    Gastroesophageal reflux disease    Knee pain    Polysubstance abuse     History reviewed. No pertinent surgical history.  Family History: History reviewed. No pertinent family history.  Social History:  reports that  he has been smoking.  He has been smoking about 1.00 pack per day. He has never used smokeless tobacco. He reports that he drinks alcohol. He reports that he uses drugs, including Cocaine, Marijuana, IV, and Heroin.  Additional Social History:  Alcohol / Drug Use Pain Medications: Pt denies abuse Prescriptions: Pt denies abuse Over the Counter: Pt denies abuse History of alcohol / drug use?: Yes Longest period of sobriety (when/how long): unknown Negative Consequences of Use: Financial, Legal Substance #1 Name of Substance 1: Alcohol 1 - Age of First Use: 13 1 - Amount (size/oz): 2 pints 1 - Frequency: daily 1 - Duration: years 1 - Last Use / Amount: 06/09/16 Substance #2 Name of Substance 2: Cocaine 2 - Age of First Use: 14 2 - Amount (size/oz): 1 gram or 2 2 - Frequency: daily 2 - Duration: years 2 - Last Use / Amount: 06/09/16 Substance #3 Name of Substance 3: Heroin 3 - Age of First Use: 18 3 - Amount (size/oz): "not much" 3 - Frequency: "every few days" 3 - Duration: years 3 - Last Use / Amount: "couple of days ago."  CIWA: CIWA-Ar BP: (!) 161/112 Pulse Rate: 108 Nausea and Vomiting: no nausea and no vomiting Tactile Disturbances: none Tremor: no tremor Auditory Disturbances: not present Paroxysmal Sweats: no sweat visible Visual Disturbances: not present Anxiety: moderately anxious, or guarded, so anxiety is inferred Headache, Fullness in Head: none present Agitation: normal activity  Orientation and Clouding of Sensorium: oriented and can do serial additions CIWA-Ar Total: 4 COWS:    PATIENT STRENGTHS: (choose at least two) Average or above average intelligence Capable of independent living Motivation for treatment/growth Work skills  Allergies: No Known Allergies  Home Medications:  (Not in a hospital admission)  OB/GYN Status:  No LMP for male patient.  General Assessment Data [facility] of Assessment: [facility] TTS Assessment: In system Is this a Tele  or Face-to-Face Assessment?: Face-to-Face Is this an Initial Assessment or a Re-assessment for this encounter?: Initial Assessment Marital status: Single Is patient pregnant?: No Pregnancy Status: No Living Arrangements: Alone Can pt return to current living arrangement?: Yes Admission Status: Voluntary Is patient capable of signing voluntary admission?: Yes Referral Source: Self/Family/Friend Insurance type: None     Crisis Care Plan Living Arrangements: Alone Name of Psychiatrist: none provided Name of Therapist: none provided  Education Status Is patient currently in school?: No Highest grade of school patient has completed: Masters Degree  Risk to self with the past 6 months Suicidal Ideation: Yes-Currently Present Has patient been a risk to self within the past 6 months prior to admission? : Yes Suicidal Intent: Yes-Currently Present Has patient had any suicidal intent within the past 6 months prior to admission? : Yes Is patient at risk for suicide?: Yes Suicidal Plan?: Yes-Currently Present Has patient had any suicidal plan within the past 6 months prior to admission? 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 : No Thoughts of Harm to Others: No Current Homicidal Intent: No Current Homicidal Plan: No Access to Homicidal Means: No History of harm to others?: No Assessment of Violence: None Noted Does patient have access to weapons?: Yes (Comment) (pt reports "I mean I can get guns.") Criminal Charges Pending?: Yes Describe Pending Criminal Charges: DUI Does patient have a court date: Yes Court Date:  (Pt unsure but states it is in October) Is patient on probation?: No  Psychosis Hallucinations: None noted Delusions: None noted  Mental Status Report Appearance/Hygiene: In scrubs Eye Contact: Good Motor Activity: Freedom of movement Speech: Logical/coherent Level of Consciousness: Alert Mood: Anxious, Helpless Affect: Anxious, Depressed, Flat Anxiety Level: Minimal Thought Processes: Coherent, Relevant Judgement: Partial Orientation: Person, Place, Time, Situation Obsessive Compulsive Thoughts/Behaviors: None  Cognitive Functioning Concentration: Fair Memory: Recent Intact, Remote Impaired IQ: Average Insight: Poor Impulse Control: Poor Appetite: Poor Sleep: Decreased Total Hours of Sleep: 2 Vegetative Symptoms: Staying in bed, Decreased grooming  ADLScreening [facility] Assessment Services) Patient's cognitive ability adequate to safely complete daily activities?: Yes Patient able to express need for assistance with ADLs?: Yes Independently performs ADLs?: Yes (appropriate for developmental age)  Prior Inpatient Therapy Prior Inpatient Therapy: Yes  Prior Therapy Dates: 2011, 2015 [facility] Reason for Treatment: suicidal thoughts  Prior Outpatient Therapy Prior Outpatient Therapy: Yes Prior Therapy Dates:  (pt could not recall) Prior Therapy Facilty/Provider(s): pt could not  recall Reason for Treatment: alcohol abuse Does patient have an ACCT team?: No Does patient have Intensive In-House Services?  : No Does patient have [facility] services? : No Does patient have P4CC services?: No  ADL Screening (condition at time of admission) Patient's cognitive ability adequate to safely complete daily activities?: Yes Is the patient deaf or have difficulty hearing?: No Does the patient have difficulty seeing, even when wearing glasses/contacts?: No Does the patient have difficulty concentrating, remembering, or making decisions?: No Patient able to express need for assistance with ADLs?: Yes Does the patient have difficulty dressing or bathing?: No Independently performs ADLs?: Yes (appropriate for developmental age) Does the patient have difficulty walking or climbing stairs?: No Weakness of Legs: None Weakness of Arms/Hands: None  Home Assistive Devices/Equipment Home Assistive Devices/Equipment: None    Abuse/Neglect Assessment (Assessment to be complete while patient is alone) Physical Abuse: Denies Verbal Abuse: Denies Sexual Abuse: Denies Exploitation of patient/patient's resources: Denies Self-Neglect: Denies     Merchant navy officerAdvance Directives (For Healthcare) Does patient have an advance directive?: No Would patient like information on creating an advanced directive?: No - patient declined information    Additional Information 1:1 In Past 12 Months?: No CIRT Risk: No Elopement Risk: No Does patient have medical clearance?: Yes     Disposition: Per Maryjean Mornharles Kober, PA pt meets inpt criteria. Frances MaywoodAbigail I Ledwell, RN has been notified. Placement is pending medical clearance per Binnie RailJoAnn Glover, [facility] reports pt's BP and potassium levels are abnormal.  Disposition Initial Assessment Completed for this Encounter: Yes Disposition of Patient: Inpatient treatment program Type of inpatient treatment program: Adult  Karolee Ohsquicha R Duff 06/10/2016 1:09 AM

## 2016-06-10 NOTE — Consult Note (Signed)
[facility] Face-to-Face Psychiatry Consult   Reason for Consult:  Alcohol and cocaine abuse with suicidal ideations Referring Physician:  EDP Patient Identification: Daniel Arroyo MRN:  [id number] Principal Diagnosis: MDD (major depressive disorder) ([place]) Diagnosis:   Patient Active Problem List   Diagnosis Date Noted  . Alcohol-induced mood disorder ([place]) [F10.94] 06/10/2016    Priority: High  . Cocaine dependence ([place]) [F14.20] 04/23/2014    Priority: High  . Alcohol use disorder, severe, dependence ([place]) [F10.20] 12/14/2013    Priority: High  . Polysubstance abuse [F19.10] 12/13/2013    Priority: High  . Alcohol withdrawal ([place]) [F10.239] 10/28/2013    Priority: High  . Substance induced mood disorder ([place]) [F19.94] 04/23/2014  . MDD (major depressive disorder) ([place]) [F32.9] 04/22/2014  . Adult ADHD [F90.0] 12/14/2013    Total Time spent with patient: 45 minutes  Subjective:   Daniel Arroyo is a 45 y.o. male patient admitted with alcohol/cocaine abuse with suicidal ideations.  HPI:  On assessment:  45 y.o. male who presents to the ED voluntarily. Pt reports that he has been feeling suicidal and states the loss of his jobs and financial issues are recent stressors for him. Pt reports he lost his job about 6 or 8 weeks ago and his suicidal thoughts are getting worse. Pt denies an active plan. The pt reports he is on the verge of losing his housing due to financial strains and he feeling suicidal and depressed. Pt endorses feelings of worthlessness and isolating himself. Pt reports he "either sleeps for a long time and does not want to get out of the bed or cannot get to sleep at all." Pt reports to excessive drinking and using drugs to ease his depressed feelings, however he reports the drugs and alcohol make the feelings worse. Pt reports he has access to weapons and states "yeah I can get guns." Pt reports to feeling guilty and engaging in risky behaviors such as drinking while  intoxicated.  Pt reports he has an upcoming court date for a DUI. Pt reports to consuming alcohol and drugs daily. Pt reports he has received inpatient therapy multiple times in the past for suicidal thoughts and depression. Pt reports he has also received OPT but he cannot recall when or where he received the treatment.    Today, he continues having intermittent, passive suicidal ideations but no plan or intent.  No hallucinations or homicidal ideations, slight tremors from alcohol withdrawal.    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, radio Injurious Behavior: None Risk to Others: Homicidal Ideation: No Thoughts of Harm to Others: No Current Homicidal Intent: No Current Homicidal Plan: No Access to Homicidal Means: No History of harm to others?: No Assessment of Violence: None Noted Does patient have access to weapons?: Yes (Comment) (pt reports "I mean I can get guns.") Criminal Charges Pending?: Yes Describe Pending Criminal Charges: DUI Does patient have a court date: Yes Court Date:  (Pt unsure but states it is in October) Prior Inpatient  Therapy: Prior Inpatient Therapy: Yes Prior Therapy Dates: 2011, 2015 Prior Therapy Facilty/Provider(s): [facility] Reason for Treatment: suicidal thoughts Prior Outpatient Therapy: Prior Outpatient Therapy: Yes Prior Therapy Dates:  (pt could not recall) Prior Therapy  Facilty/Provider(s): pt could not recall Reason for Treatment: alcohol abuse Does patient have an ACCT team?: No Does patient have Intensive In-House Services?  : No Does patient have [facility] services? : No Does patient have P4CC services?: No  Past Medical History:  Past Medical History:  Diagnosis Date  . ADHD, adult residual type   . Anxiety   . Anxiety and depression   . Bipolar disorder ([place])   . Depression   . Gastroesophageal reflux disease   . Knee pain   . Polysubstance abuse    History reviewed. No pertinent surgical history. Family History: History reviewed. No pertinent family history. Family Psychiatric  History: none Social History:  History  Alcohol Use  . Yes    Comment: daily     History  Drug Use  . Types: Cocaine, Marijuana, IV, Heroin    Comment: heroin    Social History   Social History  . Marital status: Divorced    Spouse name: N/A  . Number of children: N/A  . Years of education: N/A   Social History Main Topics  . Smoking status: Current Every Day Smoker    Packs/day: 1.00  . Smokeless tobacco: Never Used  . Alcohol use Yes     Comment: daily  . Drug use:     Types: Cocaine, Marijuana, IV, Heroin     Comment: heroin  . Sexual activity: Yes   Other Topics Concern  . None   Social History Narrative  . None   Additional Social History:    Allergies:  No Known Allergies  Labs:  Results for orders placed or performed during the hospital encounter of 06/09/16 (from the past 48 hour(s))  Rapid urine drug screen (hospital performed)     Status: Abnormal   Collection Time: 06/09/16 12:38 AM  Result Value Ref Range   Opiates NONE DETECTED NONE DETECTED   Cocaine POSITIVE (A) NONE DETECTED   Benzodiazepines NONE DETECTED NONE DETECTED   Amphetamines NONE DETECTED NONE DETECTED   Tetrahydrocannabinol NONE DETECTED NONE DETECTED   Barbiturates NONE DETECTED NONE DETECTED    Comment:        DRUG SCREEN FOR MEDICAL PURPOSES ONLY.   IF CONFIRMATION IS NEEDED FOR ANY PURPOSE, NOTIFY LAB WITHIN 5 DAYS.        LOWEST DETECTABLE LIMITS FOR URINE DRUG SCREEN Drug Class       Cutoff (ng/mL) Amphetamine      1000 Barbiturate      200 Benzodiazepine   941 Tricyclics       740 Opiates          300 Cocaine          300 THC              50   Comprehensive metabolic panel     Status: Abnormal   Collection Time: 06/09/16 11:50 PM  Result Value Ref Range   Sodium 138 135 - 145 mmol/L   Potassium 3.2 (L) 3.5 - 5.1 mmol/L   Chloride 107 101 - 111 mmol/L   CO2 20 (L) 22 - 32 mmol/L   Glucose, Bld 120 (H) 65 - 99 mg/dL   BUN 7 6 - 20 mg/dL   Creatinine, Ser 1.00 0.61 - 1.24 mg/dL   Calcium 8.9  8.9 - 10.3 mg/dL   Total Protein 6.7 6.5 - 8.1 g/dL   Albumin 4.0 3.5 - 5.0 g/dL   AST 37 15 - 41 U/L   ALT 25 17 - 63 U/L   Alkaline Phosphatase 78 38 - 126 U/L   Total Bilirubin 0.3 0.3 - 1.2 mg/dL   GFR calc non Af Amer >60 >60 mL/min   GFR calc Af Amer >60 >60 mL/min    Comment: (NOTE) The eGFR has been calculated using the CKD EPI equation. This calculation has not been validated in all clinical situations. eGFR's persistently <60 mL/min signify possible Chronic Kidney Disease.    Anion gap 11 5 - 15  Ethanol     Status: Abnormal   Collection Time: 06/09/16 11:50 PM  Result Value Ref Range   Alcohol, Ethyl (B) 169 (H) <5 mg/dL    Comment:        LOWEST DETECTABLE LIMIT FOR SERUM ALCOHOL IS 5 mg/dL FOR MEDICAL PURPOSES ONLY   Salicylate level     Status: None   Collection Time: 06/09/16 11:50 PM  Result Value Ref Range   Salicylate Lvl <1.4 2.8 - 30.0 mg/dL  Acetaminophen level     Status: Abnormal   Collection Time: 06/09/16 11:50 PM  Result Value Ref Range   Acetaminophen (Tylenol), Serum <10 (L) 10 - 30 ug/mL    Comment:        THERAPEUTIC CONCENTRATIONS VARY SIGNIFICANTLY. A RANGE OF 10-30 ug/mL MAY BE AN EFFECTIVE CONCENTRATION FOR MANY PATIENTS. HOWEVER, SOME ARE BEST TREATED AT CONCENTRATIONS  OUTSIDE THIS RANGE. ACETAMINOPHEN CONCENTRATIONS >150 ug/mL AT 4 HOURS AFTER INGESTION AND >50 ug/mL AT 12 HOURS AFTER INGESTION ARE OFTEN ASSOCIATED WITH TOXIC REACTIONS.   cbc     Status: Abnormal   Collection Time: 06/09/16 11:50 PM  Result Value Ref Range   WBC 10.0 4.0 - 10.5 K/uL   RBC 4.66 4.22 - 5.81 MIL/uL   Hemoglobin 15.8 13.0 - 17.0 g/dL   HCT 43.1 39.0 - 52.0 %   MCV 92.5 78.0 - 100.0 fL   MCH 33.9 26.0 - 34.0 pg   MCHC 36.7 (H) 30.0 - 36.0 g/dL    Comment: PRE-WARMING TECHNIQUE USED   RDW 14.7 11.5 - 15.5 %   Platelets 264 150 - 400 K/uL    Current Facility-Administered Medications  Medication Dose Route Frequency Provider Last Rate Last Dose  . cloNIDine (CATAPRES) tablet 0.1 mg  0.1 mg Oral BID Patrecia Pour, NP      . divalproex (DEPAKOTE ER) 24 hr tablet 250 mg  250 mg Oral TID Patrecia Pour, NP      . gabapentin (NEURONTIN) capsule 100 mg  100 mg Oral TID Patrecia Pour, NP      . LORazepam (ATIVAN) tablet 0-4 mg  0-4 mg Oral Q6H Antonietta Breach, PA-C   1 mg at 06/10/16 1017   Followed by  . [START ON 06/12/2016] LORazepam (ATIVAN) tablet 0-4 mg  0-4 mg Oral Q12H Antonietta Breach, PA-C      . nicotine (NICODERM CQ - dosed in mg/24 hours) patch 21 mg  21 mg Transdermal Once [employer], PA-C   21 mg at 06/10/16 0208   Current Outpatient Prescriptions  Medication Sig Dispense Refill  . cloNIDine (CATAPRES) 0.1 MG tablet Take 1 tablet (0.1 mg total) by mouth 2 (two) times daily. For high blood pressure 60 tablet 11  . divalproex (DEPAKOTE ER) 250 MG 24 hr tablet Take 1 tablet (  250 mg total) by mouth 3 (three) times daily. For mood stabilization 90 tablet 0  . gabapentin (NEURONTIN) 100 MG capsule Take 1 capsule (100 mg total) by mouth 3 (three) times daily. For substance withdrawal syndrome 90 capsule 0  . hydrOXYzine (ATARAX/VISTARIL) 25 MG tablet Take 1 tablet (25 mg ) three times daily as needed: For anxiety 30 tablet 0  . mirtazapine (REMERON) 30 MG tablet Take 1  tablet (30 mg total) by mouth at bedtime. For depression 30 tablet 0  . traZODone (DESYREL) 50 MG tablet Take 1 tablet (50 mg total) by mouth at bedtime as needed for sleep. (Patient not taking: Reported on 06/08/2016) 30 tablet 0    Musculoskeletal: Strength & Muscle Tone: within normal limits Gait & Station: normal Patient leans: N/A  Psychiatric Specialty Exam: Physical Exam  Constitutional: He is oriented to person, place, and time. He appears well-developed and well-nourished.  HENT:  Head: Normocephalic.  Neck: Normal range of motion.  Respiratory: Effort normal.  Musculoskeletal: Normal range of motion.  Neurological: He is alert and oriented to person, place, and time.  Skin: Skin is warm.  Psychiatric: His speech is normal and behavior is normal. Judgment normal. Cognition and memory are normal. He exhibits a depressed mood. He expresses suicidal ideation.    Review of Systems  Constitutional: Negative.   HENT: Negative.   Eyes: Negative.   Respiratory: Negative.   Cardiovascular: Negative.   Gastrointestinal: Negative.   Genitourinary: Negative.   Musculoskeletal: Negative.   Skin: Negative.   Neurological: Negative.   Endo/Heme/Allergies: Negative.   Psychiatric/Behavioral: Positive for depression, substance abuse and suicidal ideas.    Blood pressure 149/100, pulse 87, temperature 98.6 F (37 C), temperature source Oral, resp. rate 21, SpO2 93 %.There is no height or weight on file to calculate BMI.  General Appearance: Disheveled  Eye Contact:  Fair  Speech:  Normal Rate  Volume:  Normal  Mood:  Depressed  Affect:  Congruent  Thought Process:  Coherent and Descriptions of Associations: Intact  Orientation:  Full (Time, Place, and Person)  Thought Content:  Rumination  Suicidal Thoughts:  Yes.  without intent/plan  Homicidal Thoughts:  No  Memory:  Immediate;   Fair Recent;   Fair Remote;   Fair  Judgement:  Fair  Insight:  Fair  Psychomotor Activity:   Decreased  Concentration:  Concentration: Fair and Attention Span: Fair  Recall:  [employer] of Knowledge:  Fair  Language:  Good  Akathisia:  No  Handed:  Right  AIMS (if indicated):     Assets:  Leisure Time Physical Health Resilience  ADL's:  Intact  Cognition:  WNL  Sleep:        Treatment Plan Summary: Daily contact with patient to assess and evaluate symptoms and progress in treatment, Medication management and Plan alcohol induced mood disorder:  -Crisis stabilization -Medication management:  Start Ativan alcohol detox protocol along with his Depakote 250 mg BID for mood stabilization, Clonidine 1 mg BID for HTN, and gabapentin 100 mg TID for withdrawal symptoms.  Did not restart Trazodone and Remeron. -Individual and substance abuse counseling -Transfer to [facility] Unit  Disposition: Supportive therapy provided about ongoing stressors.  Waylan Boga, NP 06/10/2016 11:03 AM  Patient seen face-to-face for psychiatric evaluation, chart reviewed and case discussed with the physician extender and developed treatment plan. Reviewed the information documented and agree with the treatment plan. Corena Pilgrim, MD

## 2016-06-10 NOTE — BH Assessment (Signed)
[facility] Assessment Progress Note  Per Thedore MinsMojeed Akintayo, MD, this pt [facility].  Berneice Heinrichina Tate, RN, [facility] Obs [facility].  Pt has signed Voluntary Admission and Consent for Treatment, as well as Consent to Release Information, and signed [facility].  Pt's nurse, Kendal Hymendie, has been notified, and agrees to send original paperwork along with pt via [facility], and to call report to [phone] or [phone].  Doylene Canninghomas Shaivi Rothschild, MA Triage Specialist [phone]

## 2016-06-10 NOTE — BH Assessment (Signed)
Assessor attempted to contact Charles Kober, PA to discuss recommendation. Unable to reach PA at this time. Assessor will attempt to call back in order to discuss disposition recommendation for Daniel Arroyo. ° °Aquicha Duff, MSW, LCSWA  °

## 2016-06-10 NOTE — ED Notes (Signed)
Bed: [id number] Expected date:  Expected time:  Means of arrival:  Comments: Tri 8

## 2016-06-10 NOTE — ED Notes (Signed)
Pt discharged ambulatory with [place] driver.  Pt was calm and cooperative and in no distress.  All belongings were sent with pt.

## 2016-06-10 NOTE — Progress Notes (Signed)
Admission Note: Pt is a 45 y/o caucasian male admitted to Observation unit from [facility] this shift. Pt presents with depressed affect and mood on initial approach. Pt endorsed passive SI, verbally contracts for safety. Denies HI, AVH and pain at the time. Rates his depression 7/10 and anxiety 9/10 with poor appetite and average of 4-6 hours of sleep per night. Per nursing report pt does have a history of Bipolar d/o and ADHD. Recently loss his job and was feeling suicidal without a plan  Due to financial problems; "I'm about to loose my house and I can't see my children" (2 sons 47 y/o & 13y/o) and it is stressing me out". 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 Skin assessment and search done as per protocol. Pt's skin is dry with tattoo on right deltoid; multiple old scabs noted on bilateral arms and legs, per pt "I fell down last week playing at home". Unit orientation done and routines discussed with pt who verbalized understanding. Pt tolerated all PO intake well. Emotional support and availability provided to pt. Encouraged to voice concerns and comply with treatment regimen. Safety maintained in Obs. Unit without self harm gestures or outburst to note thus far. Will continue to monitor pt for safety and mood stabilization.

## 2016-06-11 MED ORDER — CLONIDINE HCL 0.1 MG PO TABS: 0.1000 mg | ORAL_TABLET | Freq: Two times a day (BID) | ORAL | 11 refills | Status: DC

## 2016-06-11 MED ORDER — GABAPENTIN 100 MG PO CAPS: 100.0000 mg | ORAL_CAPSULE | Freq: Three times a day (TID) | ORAL | 0 refills | Status: DC

## 2016-06-11 MED ORDER — DIVALPROEX SODIUM ER 250 MG PO TB24: 250.0000 mg | ORAL_TABLET | Freq: Three times a day (TID) | ORAL | 0 refills | Status: DC

## 2016-06-11 MED ORDER — HYDROXYZINE HCL 25 MG PO TABS: 25.0000 mg | ORAL_TABLET | Freq: Four times a day (QID) | ORAL | 0 refills | Status: DC | PRN

## 2016-06-11 NOTE — Progress Notes (Signed)
Pt on unit in bed 1.  Pt is 45 yo male who reports depression and anxiety.  Pt sts he was suicidal with no plan.  Pt verbally contracts for safety.  Pt denies HI and AVH.   Pt reports hx of bipolar disorder and ADHD.  Pt stressors are inability to see his 2 sons, job loss and fear he will lose his house. Pt asks for snacks. Pt given snacks and is watching TV in bed. Pt remains safe on unit.

## 2016-06-11 NOTE — Progress Notes (Signed)
D: Patient denies SI/HI and A/V hallucinations;   A: Monitored continuously in observation;  patient educated about medications; patient given medications per physician orders; patient encouraged to express feelings and/or concerns  R: Patient is asleep at this time

## 2016-06-11 NOTE — Discharge Instructions (Signed)
Pt reports that he is on a waiting list [facility] schedule given to pt  Follow up with medical provider for medical needs such as  Hypertension

## 2016-06-11 NOTE — Progress Notes (Signed)
Pt d/c home. All items returned. D/C instructions given, samples given and prescriptions given. Pt denies si and hi.

## 2016-06-11 NOTE — H&P (Signed)
[facility]Observ[facility] Unit H & P  Patient Identification: Daniel Arroyo MRN:  [id number] Principal Diagnosis: MDD (major depressive disorder) ([place]) Diagnosis:        Patient Active Problem List   Diagnosis Date Noted  . Alcohol-induced mood disorder ([place]) [F10.94] 06/10/2016    Priority: High  . Cocaine dependence ([place]) [F14.20] 04/23/2014    Priority: High  . Alcohol use disorder, severe, dependence ([place]) [F10.20] 12/14/2013    Priority: High  . Polysubstance abuse [F19.10] 12/13/2013    Priority: High  . Alcohol withdrawal ([place]) [F10.239] 10/28/2013    Priority: High  . Substance induced mood disorder ([place]) [F19.94] 04/23/2014  . MDD (major depressive disorder) ([place]) [F32.9] 04/22/2014  . Adult ADHD [F90.0] 12/14/2013    Total Time spent with patient: 45 minutes  Subjective:   Daniel Arroyo is a 45 y.o. male patient admitted with alcohol/cocaine abuse with suicidal ideations.  HPI:  On assessment:  Daniel Arroyo is a 45 y.o.malewho presents to the [facility] voluntarily on 06/09/2016. Pt reports that he has been feeling suicidal and states the loss of his jobs and financial issues are recent stressors for him. Pt reports he lost his job about 6 or 8 weeks ago and his suicidal thoughts are getting worse. Pt denies an active plan. The pt reports he is on the verge of losing his housing due to financial strains and he feeling suicidal and depressed. Pt endorses feelings of worthlessness and isolating himself. Pt reports he "either sleeps for a long time and does not want to get out of the bed or cannot get to sleep at all." Pt reports to excessive drinking and using drugs to ease his depressed feelings, however he reports the drugs and alcohol make the feelings worse.  Pt reports to feeling guilty and engaging in risky behaviors such as drinking while intoxicated.  Pt reports he has an upcoming court date for a DUI. Pt reports to consuming alcohol and drugs daily. Pt reports  he has received inpatient therapy multiple times in the past for suicidal thoughts and depression. Pt reports he has also received OPT but he cannot recall when or where he received the treatment. On presentation to the ED his urine drug screen was positive for cocaine and his alcohol level was 169.   During assessment 06/11/2016, patient states "I feel much more upbeat. I think the drugs and alcohol cleared out of my system. I'm ready to go home. I am on the waiting list for [facility] for hopefully inpatient to help my drug use. My mood is much more stable." Patient reported needing help to afford his medications and was provided with sample medications. The Observation Unit staff provided patient with the resource numbers to follow up with [facility] after discharge from the unit. Per documentation from the ED a case manager reviewed options for [facility] resources to assist patient due to not being insured. These resources specifically addressed assisting the patient with affording his medications as he reported this was a concern. His vitals were noted to be stable with no signs that were consistent with alcohol withdrawal.   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, radio Injurious Behavior: None Risk to Others: Homicidal Ideation: No Thoughts of Harm to Others: No Current Homicidal Intent: No Current Homicidal Plan: No Access to Homicidal Means: No History of harm to  others?: No Assessment of Violence: None Noted Does patient have access to weapons?: Yes (Comment) (pt reports "I mean I can get guns.") Criminal Charges Pending?: Yes Describe Pending Criminal Charges: DUI Does patient have a court date: Yes Court Date:  (Pt unsure but states it is in October) Prior Inpatient Therapy: Prior Inpatient Therapy: Yes Prior Therapy Dates: 2011, 2015 Prior Therapy Facilty/Provider(s): [facility] Reason for Treatment: suicidal thoughts Prior Outpatient Therapy: Prior Outpatient Therapy: Yes Prior Therapy Dates:  (pt could not recall) Prior Therapy Facilty/Provider(s): pt could not recall Reason for Treatment: alcohol abuse Does patient have an ACCT team?: No Does patient have Intensive In-House Services?  : No Does patient have [facility] services? : No Does patient have P4CC services?: No  Past Medical History:      Past Medical History:  Diagnosis Date  . ADHD, adult residual type   . Anxiety   . Anxiety and depression   . Bipolar disorder ([place])   . Depression   . Gastroesophageal reflux disease   . Knee pain   . Polysubstance abuse    History reviewed. No pertinent surgical history. Family History: History reviewed. No pertinent family history. Family Psychiatric  History: none Social History:      History  Alcohol Use  . Yes    Comment: daily          History  Drug Use  . Types: Cocaine, Marijuana, IV, Heroin    Comment: heroin    Social History        Social History  . Marital status: Divorced    Spouse name: N/A  . Number of children: N/A  . Years of education: N/A         Social History Main Topics  . Smoking status: Current Every Day Smoker    Packs/day: 1.00  . Smokeless tobacco: Never Used  . Alcohol use Yes     Comment: daily  . Drug use:     Types: Cocaine, Marijuana, IV, Heroin     Comment: heroin  . Sexual activity: Yes       Other Topics Concern  . None      Social History  Narrative  . None   Additional Social History:    Allergies:  No Known Allergies  Labs:  Lab Results Last 48 Hours        Results for orders placed or performed during the hospital encounter of 06/09/16 (from the past 48 hour(s))  Rapid urine drug screen (hospital performed)     Status: Abnormal   Collection Time: 06/09/16 12:38 AM  Result Value Ref Range   Opiates NONE DETECTED NONE DETECTED   Cocaine POSITIVE (A) NONE DETECTED   Benzodiazepines NONE DETECTED NONE DETECTED   Amphetamines NONE DETECTED NONE DETECTED   Tetrahydrocannabinol NONE DETECTED NONE DETECTED   Barbiturates NONE DETECTED NONE DETECTED    Comment:        DRUG SCREEN FOR MEDICAL PURPOSES ONLY.  IF CONFIRMATION IS NEEDED FOR ANY PURPOSE, NOTIFY LAB WITHIN 5 DAYS.        LOWEST DETECTABLE LIMITS FOR URINE DRUG SCREEN Drug Class       Cutoff (ng/mL)  Amphetamine      1000 Barbiturate      200 Benzodiazepine   756 Tricyclics       433 Opiates          300 Cocaine          300 THC              50  Comprehensive metabolic panel     Status: Abnormal   Collection Time: 06/09/16 11:50 PM  Result Value Ref Range   Sodium 138 135 - 145 mmol/L   Potassium 3.2 (L) 3.5 - 5.1 mmol/L   Chloride 107 101 - 111 mmol/L   CO2 20 (L) 22 - 32 mmol/L   Glucose, Bld 120 (H) 65 - 99 mg/dL   BUN 7 6 - 20 mg/dL   Creatinine, Ser 1.00 0.61 - 1.24 mg/dL   Calcium 8.9 8.9 - 10.3 mg/dL   Total Protein 6.7 6.5 - 8.1 g/dL   Albumin 4.0 3.5 - 5.0 g/dL   AST 37 15 - 41 U/L   ALT 25 17 - 63 U/L   Alkaline Phosphatase 78 38 - 126 U/L   Total Bilirubin 0.3 0.3 - 1.2 mg/dL   GFR calc non Af Amer >60 >60 mL/min   GFR calc Af Amer >60 >60 mL/min    Comment: (NOTE) The eGFR has been calculated using the CKD EPI equation. This calculation has not been validated in all clinical situations. eGFR's persistently <60 mL/min signify possible Chronic Kidney Disease.   Anion gap 11 5 - 15  Ethanol      Status: Abnormal   Collection Time: 06/09/16 11:50 PM  Result Value Ref Range   Alcohol, Ethyl (B) 169 (H) <5 mg/dL    Comment:        LOWEST DETECTABLE LIMIT FOR SERUM ALCOHOL IS 5 mg/dL FOR MEDICAL PURPOSES ONLY  Salicylate level     Status: None   Collection Time: 06/09/16 11:50 PM  Result Value Ref Range   Salicylate Lvl <2.9 2.8 - 30.0 mg/dL  Acetaminophen level     Status: Abnormal   Collection Time: 06/09/16 11:50 PM  Result Value Ref Range   Acetaminophen (Tylenol), Serum <10 (L) 10 - 30 ug/mL    Comment:        THERAPEUTIC CONCENTRATIONS VARY SIGNIFICANTLY. A RANGE OF 10-30 ug/mL MAY BE AN EFFECTIVE CONCENTRATION FOR MANY PATIENTS. HOWEVER, SOME ARE BEST TREATED AT CONCENTRATIONS OUTSIDE THIS RANGE. ACETAMINOPHEN CONCENTRATIONS >150 ug/mL AT 4 HOURS AFTER INGESTION AND >50 ug/mL AT 12 HOURS AFTER INGESTION ARE OFTEN ASSOCIATED WITH TOXIC REACTIONS.  cbc     Status: Abnormal   Collection Time: 06/09/16 11:50 PM  Result Value Ref Range   WBC 10.0 4.0 - 10.5 K/uL   RBC 4.66 4.22 - 5.81 MIL/uL   Hemoglobin 15.8 13.0 - 17.0 g/dL   HCT 43.1 39.0 - 52.0 %   MCV 92.5 78.0 - 100.0 fL   MCH 33.9 26.0 - 34.0 pg   MCHC 36.7 (H) 30.0 - 36.0 g/dL    Comment: PRE-WARMING TECHNIQUE USED   RDW 14.7 11.5 - 15.5 %   Platelets 264 150 - 400 K/uL               Current Facility-Administered Medications  Medication Dose Route Frequency Provider Last Rate Last Dose  . cloNIDine (CATAPRES) tablet 0.1 mg  0.1 mg Oral BID Patrecia Pour, NP      . divalproex (DEPAKOTE ER) 24 hr tablet 250 mg  250 mg Oral TID Patrecia Pour, NP      . gabapentin (NEURONTIN) capsule 100 mg  100 mg Oral TID Patrecia Pour, NP      . LORazepam (ATIVAN) tablet 0-4 mg  0-4 mg Oral Q6H Antonietta Breach, PA-C   1 mg at 06/10/16 1017   Followed by  . [START ON 06/12/2016] LORazepam (ATIVAN) tablet 0-4 mg  0-4 mg Oral Q12H Antonietta Breach, PA-C      . nicotine (NICODERM CQ - dosed in mg/24  hours) patch 21 mg  21 mg Transdermal Once [employer], PA-C   21 mg at 06/10/16 0208         Current Outpatient Prescriptions  Medication Sig Dispense Refill  . cloNIDine (CATAPRES) 0.1 MG tablet Take 1 tablet (0.1 mg total) by mouth 2 (two) times daily. For high blood pressure 60 tablet 11  . divalproex (DEPAKOTE ER) 250 MG 24 hr tablet Take 1 tablet (250 mg total) by mouth 3 (three) times daily. For mood stabilization 90 tablet 0  . gabapentin (NEURONTIN) 100 MG capsule Take 1 capsule (100 mg total) by mouth 3 (three) times daily. For substance withdrawal syndrome 90 capsule 0  . hydrOXYzine (ATARAX/VISTARIL) 25 MG tablet Take 1 tablet (25 mg ) three times daily as needed: For anxiety 30 tablet 0  . mirtazapine (REMERON) 30 MG tablet Take 1 tablet (30 mg total) by mouth at bedtime. For depression 30 tablet 0  . traZODone (DESYREL) 50 MG tablet Take 1 tablet (50 mg total) by mouth at bedtime as needed for sleep. (Patient not taking: Reported on 06/08/2016) 30 tablet 0    Musculoskeletal: Strength & Muscle Tone: within normal limits Gait & Station: normal Patient leans: N/A  Psychiatric Specialty Exam: Physical Exam  Constitutional: He is oriented to person, place, and time. He appears well-developed and well-nourished.  HENT:  Head: Normocephalic.  Neck: Normal range of motion.  Respiratory: Effort normal.  Musculoskeletal: Normal range of motion.  Neurological: He is alert and oriented to person, place, and time.  Skin: Skin is warm.  Psychiatric: His speech is normal and behavior is normal. Judgment normal. Cognition and memory are normal. He exhibits a depressed mood. He denies suicidal ideation.    Review of Systems  Constitutional: Negative.   HENT: Negative.   Eyes: Negative.   Respiratory: Negative.   Cardiovascular: Negative.   Gastrointestinal: Negative.   Genitourinary: Negative.   Musculoskeletal: Negative.   Skin: Negative.   Neurological: Negative.    Endo/Heme/Allergies: Negative.   Psychiatric/Behavioral: Positive for depression, substance abuse and suicidal ideas.    Blood pressure 149/100, pulse 87, temperature 98.6 F (37 C), temperature source Oral, resp. rate 21, SpO2 93 %.There is no height or weight on file to calculate BMI.  General Appearance: Disheveled  Eye Contact:  Fair  Speech:  Normal Rate  Volume:  Normal  Mood:  Depressed  Affect:  Congruent  Thought Process:  Coherent and Descriptions of Associations: Intact  Orientation:  Full (Time, Place, and Person)  Thought Content:  Rumination  Suicidal Thoughts: Denies  Homicidal Thoughts:  No  Memory:  Immediate;   Fair Recent;   Fair Remote;   Fair  Judgement:  Fair  Insight:  Fair  Psychomotor Activity:  Decreased  Concentration:  Concentration: Fair and Attention Span: Fair  Recall:  [employer] of Knowledge:  Fair  Language:  Good  Akathisia:  No  Handed:  Right  AIMS (if indicated):  Assets:  Leisure Time Physical Health Resilience  ADL's:  Intact  Cognition:  WNL  Sleep:        Treatment Plan Summary: Daily contact with patient to assess and evaluate symptoms and progress in treatment, Medication management and Plan alcohol induced mood disorder:  -Crisis stabilization in the [facility] -Medication management:  Start Ativan alcohol detox protocol along with his Depakote 250 mg BID for mood stabilization, Clonidine 1 mg BID for HTN, and gabapentin 100 mg TID for withdrawal symptoms.   -Individual and substance abuse counseling  Disposition: Supportive therapy provided about ongoing stressors. Patient stable to be discharged from the [facility] unit today with sample medications and outpatient follow up in place.   Elmarie Shiley NP-C 06/11/2016 14:30   Agree with NP assessment as above

## 2016-06-11 NOTE — Discharge Summary (Signed)
       [facility]Observ[facility] Unit Discharge Summary  Daniel Arroyo is a 45 y.o.malewho [facility]. Pt reports that he has been feeling suicidal and states the loss of his jobs and financial issues are recent stressors for him. Pt reports he lost his job about 6 or 8 weeks ago and his suicidal thoughts are getting worse. Pt denies an active plan. The pt reports he is on the verge of losing his housing due to financial strains and he feeling suicidal and depressed. Pt endorses feelings of worthlessness and isolating himself. Pt reports he "either sleeps for a long time and does not want to get out of the bed or cannot get to sleep at all." Pt reports to excessive drinking and using drugs to ease his depressed feelings, however he reports the drugs and alcohol make the feelings worse.  Pt reports to feeling guilty and engaging in risky behaviors such as drinking while intoxicated.  Pt reports he has an upcoming court date for a DUI. Pt reports to consuming alcohol and drugs daily. Pt reports he has received inpatient therapy multiple times in the past for suicidal thoughts and depression. Pt reports he has also received OPT but he cannot recall when or where he received the treatment. On presentation to the ED his urine drug screen was positive for cocaine and his alcohol level was 169. [facility] later in the day.   During assessment 06/11/2016, patient states "I feel much more upbeat. I think the drugs and alcohol cleared out of my system. I'm ready to go home. [facility] to help my drug use. My mood is much more stable." Patient reported needing help to afford his medications and was provided with sample medications of depakote and neurontin. The Observation Unit staff provided patient with the resource numbers to follow up with  [facility] after discharge from the unit. Per documentation from the ED a case manager [facility] to assist patient due to not being insured. These resources specifically addressed assisting the patient with affording his medications as he reported this was a concern. His vitals were noted to be stable with no signs that were consistent with alcohol withdrawal. Patient left [facility] in stable condition with all belongings returned to him.   Agree with NP Fransisca Kaufmann( Daniel Arroyo ) note as above

## 2016-07-16 ENCOUNTER — Encounter (HOSPITAL_COMMUNITY): Payer: Self-pay | Admitting: *Deleted

## 2016-07-16 ENCOUNTER — Telehealth: Payer: Self-pay

## 2016-07-16 ENCOUNTER — Emergency Department (HOSPITAL_COMMUNITY)
Admission: EM | Admit: 2016-07-16 | Discharge: 2016-07-17 | Disposition: A | Discharge status: Other Acute Inpatient Hospital | Payer: Self-pay | Attending: Emergency Medicine | Admitting: Emergency Medicine

## 2016-07-16 DIAGNOSIS — F17210 Nicotine dependence, cigarettes, uncomplicated: Secondary | ICD-10-CM | POA: Insufficient documentation

## 2016-07-16 DIAGNOSIS — F319 Bipolar disorder, unspecified: Secondary | ICD-10-CM | POA: Insufficient documentation

## 2016-07-16 DIAGNOSIS — R45851 Suicidal ideations: Secondary | ICD-10-CM

## 2016-07-16 DIAGNOSIS — F908 Attention-deficit hyperactivity disorder, other type: Secondary | ICD-10-CM | POA: Insufficient documentation

## 2016-07-16 DIAGNOSIS — Z79899 Other long term (current) drug therapy: Secondary | ICD-10-CM | POA: Insufficient documentation

## 2016-07-16 DIAGNOSIS — F1210 Cannabis abuse, uncomplicated: Secondary | ICD-10-CM | POA: Insufficient documentation

## 2016-07-16 DIAGNOSIS — F1420 Cocaine dependence, uncomplicated: Secondary | ICD-10-CM | POA: Insufficient documentation

## 2016-07-16 DIAGNOSIS — F1094 Alcohol use, unspecified with alcohol-induced mood disorder: Secondary | ICD-10-CM

## 2016-07-16 DIAGNOSIS — F1020 Alcohol dependence, uncomplicated: Secondary | ICD-10-CM | POA: Insufficient documentation

## 2016-07-16 NOTE — ED Triage Notes (Signed)
Pt c/o SI with a plan to shoot himself; pt states that he has rifles, shotguns and handguns at home; pt states that he has a hx of depression and that he has been drinking for awhile; pt states that he tells himself everyday that he is not going to drink but ends up drinking and then that leads to cocaine use; pt states that he gets upset bc he promised himself he wouldn't do it and then he does which increase his depression and suicidal thoughts; pt states that [facility] and feels like they helped him and states "I want to go back"

## 2016-07-17 ENCOUNTER — Inpatient Hospital Stay (HOSPITAL_COMMUNITY)
Admission: AD | Admit: 2016-07-17 | Discharge: 2016-07-20 | DRG: 885 | Disposition: A | Discharge status: Home / Self Care | Payer: Federal, State, Local not specified - Other | Source: Intra-hospital | Attending: Psychiatry | Admitting: Psychiatry

## 2016-07-17 ENCOUNTER — Encounter (HOSPITAL_COMMUNITY): Payer: Self-pay

## 2016-07-17 DIAGNOSIS — F1290 Cannabis use, unspecified, uncomplicated: Secondary | ICD-10-CM | POA: Diagnosis present

## 2016-07-17 DIAGNOSIS — F17210 Nicotine dependence, cigarettes, uncomplicated: Secondary | ICD-10-CM | POA: Diagnosis present

## 2016-07-17 DIAGNOSIS — K219 Gastro-esophageal reflux disease without esophagitis: Secondary | ICD-10-CM | POA: Diagnosis present

## 2016-07-17 DIAGNOSIS — R45851 Suicidal ideations: Secondary | ICD-10-CM | POA: Diagnosis not present

## 2016-07-17 DIAGNOSIS — Z79899 Other long term (current) drug therapy: Secondary | ICD-10-CM | POA: Diagnosis not present

## 2016-07-17 DIAGNOSIS — F1420 Cocaine dependence, uncomplicated: Secondary | ICD-10-CM | POA: Diagnosis present

## 2016-07-17 DIAGNOSIS — F1024 Alcohol dependence with alcohol-induced mood disorder: Secondary | ICD-10-CM | POA: Diagnosis present

## 2016-07-17 DIAGNOSIS — G4700 Insomnia, unspecified: Secondary | ICD-10-CM | POA: Diagnosis present

## 2016-07-17 DIAGNOSIS — F339 Major depressive disorder, recurrent, unspecified: Principal | ICD-10-CM | POA: Diagnosis present

## 2016-07-17 DIAGNOSIS — F1020 Alcohol dependence, uncomplicated: Secondary | ICD-10-CM | POA: Diagnosis not present

## 2016-07-17 DIAGNOSIS — Z23 Encounter for immunization: Secondary | ICD-10-CM | POA: Diagnosis not present

## 2016-07-17 DIAGNOSIS — F331 Major depressive disorder, recurrent, moderate: Secondary | ICD-10-CM | POA: Diagnosis not present

## 2016-07-17 LAB — RAPID URINE DRUG SCREEN, HOSP PERFORMED
AMPHETAMINES: NOT DETECTED
Barbiturates: NOT DETECTED
Benzodiazepines: NOT DETECTED
COCAINE: POSITIVE — AB
OPIATES: NOT DETECTED
TETRAHYDROCANNABINOL: POSITIVE — AB

## 2016-07-17 LAB — CBC
HCT: 41.8 % (ref 39.0–52.0)
HEMOGLOBIN: 15.2 g/dL (ref 13.0–17.0)
MCH: 35.1 pg — AB (ref 26.0–34.0)
MCHC: 36.4 g/dL — ABNORMAL HIGH (ref 30.0–36.0)
MCV: 96.5 fL (ref 78.0–100.0)
Platelets: 321 10*3/uL (ref 150–400)
RBC: 4.33 MIL/uL (ref 4.22–5.81)
RDW: 16 % — ABNORMAL HIGH (ref 11.5–15.5)
WBC: 5.6 10*3/uL (ref 4.0–10.5)

## 2016-07-17 LAB — COMPREHENSIVE METABOLIC PANEL
ALBUMIN: 4.1 g/dL (ref 3.5–5.0)
ALT: 29 U/L (ref 17–63)
AST: 47 U/L — AB (ref 15–41)
Alkaline Phosphatase: 77 U/L (ref 38–126)
Anion gap: 10 (ref 5–15)
BUN: 6 mg/dL (ref 6–20)
CHLORIDE: 108 mmol/L (ref 101–111)
CO2: 22 mmol/L (ref 22–32)
CREATININE: 0.8 mg/dL (ref 0.61–1.24)
Calcium: 9.3 mg/dL (ref 8.9–10.3)
GFR calc non Af Amer: >60 mL/min (ref >60)
Glucose, Bld: 61 mg/dL — ABNORMAL LOW (ref 65–99)
Potassium: 3.5 mmol/L (ref 3.5–5.1)
SODIUM: 140 mmol/L (ref 135–145)
Total Bilirubin: 0.4 mg/dL (ref 0.3–1.2)
Total Protein: 6.7 g/dL (ref 6.5–8.1)

## 2016-07-17 LAB — SALICYLATE LEVEL

## 2016-07-17 LAB — ETHANOL: Alcohol, Ethyl (B): 202 mg/dL — ABNORMAL HIGH (ref <5)

## 2016-07-17 LAB — ACETAMINOPHEN LEVEL: Acetaminophen (Tylenol), Serum: <10 ug/mL — ABNORMAL LOW (ref 10–30)

## 2016-07-17 MED ORDER — LORAZEPAM 1 MG PO TABS
1.0000 mg | ORAL_TABLET | Freq: Three times a day (TID) | ORAL | Status: AC
  Administered 2016-07-18 (×3): 1 mg via ORAL
  Filled 2016-07-17 (×2): qty 1

## 2016-07-17 MED ORDER — ONDANSETRON 4 MG PO TBDP: 4.0000 mg | ORAL_TABLET | Freq: Four times a day (QID) | ORAL | Status: DC | PRN

## 2016-07-17 MED ORDER — ONDANSETRON HCL 4 MG PO TABS: 4.0000 mg | ORAL_TABLET | Freq: Three times a day (TID) | ORAL | Status: DC | PRN

## 2016-07-17 MED ORDER — PNEUMOCOCCAL VAC POLYVALENT 25 MCG/0.5ML IJ INJ
0.5000 mL | INJECTION | INTRAMUSCULAR | Status: AC
  Administered 2016-07-18: 0.5 mL via INTRAMUSCULAR

## 2016-07-17 MED ORDER — MAGNESIUM HYDROXIDE 400 MG/5ML PO SUSP: 30.0000 mL | Freq: Every day | ORAL | Status: DC | PRN

## 2016-07-17 MED ORDER — HYDROXYZINE HCL 25 MG PO TABS
25.0000 mg | ORAL_TABLET | Freq: Four times a day (QID) | ORAL | Status: AC | PRN
  Filled 2016-07-17: qty 10

## 2016-07-17 MED ORDER — ALUM & MAG HYDROXIDE-SIMETH 200-200-20 MG/5ML PO SUSP: 30.0000 mL | ORAL | Status: DC | PRN

## 2016-07-17 MED ORDER — ONDANSETRON 4 MG PO TBDP: 4.0000 mg | ORAL_TABLET | Freq: Four times a day (QID) | ORAL | Status: AC | PRN

## 2016-07-17 MED ORDER — ADULT MULTIVITAMIN W/MINERALS CH
1.0000 | ORAL_TABLET | Freq: Every day | ORAL | Status: DC
  Administered 2016-07-17 – 2016-07-20 (×4): 1 via ORAL
  Filled 2016-07-17 (×7): qty 1

## 2016-07-17 MED ORDER — HYDROXYZINE HCL 25 MG PO TABS
25.0000 mg | ORAL_TABLET | Freq: Four times a day (QID) | ORAL | Status: DC | PRN
  Administered 2016-07-17: 25 mg via ORAL
  Filled 2016-07-17: qty 1

## 2016-07-17 MED ORDER — LOPERAMIDE HCL 2 MG PO CAPS: 2.0000 mg | ORAL_CAPSULE | ORAL | Status: DC | PRN

## 2016-07-17 MED ORDER — LORAZEPAM 1 MG PO TABS: 1.0000 mg | ORAL_TABLET | Freq: Every day | ORAL | Status: DC

## 2016-07-17 MED ORDER — ENSURE ENLIVE PO LIQD
237.0000 mL | Freq: Two times a day (BID) | ORAL | Status: DC
  Administered 2016-07-17 – 2016-07-20 (×4): 237 mL via ORAL

## 2016-07-17 MED ORDER — NICOTINE 21 MG/24HR TD PT24
21.0000 mg | MEDICATED_PATCH | Freq: Every day | TRANSDERMAL | Status: DC
  Administered 2016-07-17: 21 mg via TRANSDERMAL

## 2016-07-17 MED ORDER — ADULT MULTIVITAMIN W/MINERALS CH
1.0000 | ORAL_TABLET | Freq: Every day | ORAL | Status: DC
  Filled 2016-07-17 (×2): qty 1

## 2016-07-17 MED ORDER — NICOTINE 21 MG/24HR TD PT24
21.0000 mg | MEDICATED_PATCH | Freq: Every day | TRANSDERMAL | Status: DC
  Filled 2016-07-17: qty 1

## 2016-07-17 MED ORDER — LOPERAMIDE HCL 2 MG PO CAPS: 2.0000 mg | ORAL_CAPSULE | ORAL | Status: AC | PRN

## 2016-07-17 MED ORDER — LORAZEPAM 1 MG PO TABS
1.0000 mg | ORAL_TABLET | Freq: Two times a day (BID) | ORAL | Status: DC
  Administered 2016-07-20: 1 mg via ORAL
  Filled 2016-07-17: qty 1

## 2016-07-17 MED ORDER — IBUPROFEN 200 MG PO TABS: 600.0000 mg | ORAL_TABLET | Freq: Three times a day (TID) | ORAL | Status: DC | PRN

## 2016-07-17 MED ORDER — VITAMIN B-1 100 MG PO TABS
100.0000 mg | ORAL_TABLET | Freq: Every day | ORAL | Status: DC
  Administered 2016-07-18 – 2016-07-20 (×3): 100 mg via ORAL
  Filled 2016-07-17 (×5): qty 1

## 2016-07-17 MED ORDER — VITAMIN B-1 100 MG PO TABS
100.0000 mg | ORAL_TABLET | Freq: Every day | ORAL | Status: DC
  Filled 2016-07-17: qty 1

## 2016-07-17 MED ORDER — VITAMIN B-1 100 MG PO TABS: 100.0000 mg | ORAL_TABLET | Freq: Every day | ORAL | Status: DC

## 2016-07-17 MED ORDER — LORAZEPAM 1 MG PO TABS
1.0000 mg | ORAL_TABLET | Freq: Four times a day (QID) | ORAL | Status: AC
  Administered 2016-07-17 – 2016-07-18 (×6): 1 mg via ORAL
  Filled 2016-07-17 (×4): qty 1

## 2016-07-17 MED ORDER — HYDROXYZINE HCL 25 MG PO TABS: 25.0000 mg | ORAL_TABLET | Freq: Three times a day (TID) | ORAL | Status: DC | PRN

## 2016-07-17 MED ORDER — LORAZEPAM 1 MG PO TABS
1.0000 mg | ORAL_TABLET | Freq: Four times a day (QID) | ORAL | Status: DC
  Administered 2016-07-17: 1 mg via ORAL
  Filled 2016-07-17: qty 1

## 2016-07-17 MED ORDER — LORAZEPAM 1 MG PO TABS: 1.0000 mg | ORAL_TABLET | Freq: Two times a day (BID) | ORAL | Status: DC

## 2016-07-17 MED ORDER — ACETAMINOPHEN 325 MG PO TABS: 650.0000 mg | ORAL_TABLET | Freq: Four times a day (QID) | ORAL | Status: DC | PRN

## 2016-07-17 MED ORDER — THIAMINE HCL 100 MG/ML IJ SOLN: 100.0000 mg | Freq: Once | INTRAMUSCULAR | Status: DC

## 2016-07-17 MED ORDER — INFLUENZA VAC SPLIT QUAD 0.5 ML IM SUSY
0.5000 mL | PREFILLED_SYRINGE | INTRAMUSCULAR | Status: AC
  Administered 2016-07-18: 0.5 mL via INTRAMUSCULAR
  Filled 2016-07-17: qty 0.5

## 2016-07-17 MED ORDER — TRAZODONE HCL 50 MG PO TABS
50.0000 mg | ORAL_TABLET | Freq: Every evening | ORAL | Status: DC | PRN
  Administered 2016-07-17 (×2): 50 mg via ORAL
  Filled 2016-07-17 (×7): qty 1

## 2016-07-17 MED ORDER — ADULT MULTIVITAMIN W/MINERALS CH
1.0000 | ORAL_TABLET | Freq: Every day | ORAL | Status: DC
  Administered 2016-07-17: 1 via ORAL
  Filled 2016-07-17: qty 1

## 2016-07-17 MED ORDER — LORAZEPAM 1 MG PO TABS: 1.0000 mg | ORAL_TABLET | Freq: Three times a day (TID) | ORAL | Status: DC

## 2016-07-17 MED ORDER — LORAZEPAM 1 MG PO TABS
1.0000 mg | ORAL_TABLET | Freq: Four times a day (QID) | ORAL | Status: AC | PRN
  Administered 2016-07-18: 1 mg via ORAL
  Filled 2016-07-17: qty 1

## 2016-07-17 MED ORDER — LORAZEPAM 1 MG PO TABS: 1.0000 mg | ORAL_TABLET | Freq: Four times a day (QID) | ORAL | Status: DC | PRN

## 2016-07-17 NOTE — ED Provider Notes (Signed)
[facility] DEPT Provider Note   CSN: [id number] Arrival date & time: 07/16/16  2330     History   Chief Complaint Chief Complaint  Patient presents with  . Suicidal    HPI Daniel Arroyo is a 45 y.o. male.  Daniel Arroyo is a 45 y.o. Male with a history of mental illness who presents to the emergency department complaining of suicidal ideations today. He reports he has a plan to shoot himself. He reports he has access to guns at home. He reports a long history of depression and alcohol abuse. He reports is been getting gradually worse over the past several months. He reports recently losing his job and this has contributed to his symptoms. He has not been taking his psychiatric medications. He reports drinking alcohol daily. He denies history complex withdrawal from alcohol. He denies physical complaints. He denies homicidal ideations. He denies visual or auditory hallucinations. He denies attempts to harm himself tonight. He denies illicit substance abuse.   The history is provided by the patient. No language interpreter was used.    Past Medical History:  Diagnosis Date  . ADHD, adult residual type   . Anxiety   . Anxiety and depression   . Bipolar disorder (HCC)   . Depression   . Gastroesophageal reflux disease   . Knee pain   . Polysubstance abuse     Patient Active Problem List   Diagnosis Date Noted  . Alcohol-induced mood disorder (HCC) 06/10/2016  . Cocaine dependence (HCC) 04/23/2014  . Substance induced mood disorder (HCC) 04/23/2014  . MDD (major depressive disorder) (HCC) 04/22/2014  . Alcohol use disorder, severe, dependence (HCC) 12/14/2013  . Adult ADHD 12/14/2013  . Polysubstance abuse 12/13/2013  . Alcohol withdrawal (HCC) 10/28/2013    Past Surgical History:  Procedure Laterality Date  . TONSILLECTOMY         Home Medications    Prior to Admission medications   Medication Sig Start Date End Date Taking? Authorizing Provider    calcium carbonate (TUMS - DOSED IN MG ELEMENTAL CALCIUM) 500 MG chewable tablet Chew 1-2 tablets by mouth daily as needed for indigestion or heartburn.   Yes Historical Provider, MD  cloNIDine (CATAPRES) 0.1 MG tablet Take 1 tablet (0.1 mg total) by mouth 2 (two) times daily. For high blood pressure Patient not taking: Reported on 07/16/2016 06/11/16   Thermon LeylandLaura A Davis, NP  divalproex (DEPAKOTE ER) 250 MG 24 hr tablet Take 1 tablet (250 mg total) by mouth 3 (three) times daily. Patient not taking: Reported on 07/16/2016 06/11/16   Thermon LeylandLaura A Davis, NP  gabapentin (NEURONTIN) 100 MG capsule Take 1 capsule (100 mg total) by mouth 3 (three) times daily. Patient not taking: Reported on 07/16/2016 06/11/16   Thermon LeylandLaura A Davis, NP  hydrOXYzine (ATARAX/VISTARIL) 25 MG tablet Take 1 tablet (25 mg total) by mouth every 6 (six) hours as needed for anxiety. Patient not taking: Reported on 07/16/2016 06/11/16   Thermon LeylandLaura A Davis, NP    Family History No family history on file.  Social History Social History  Substance Use Topics  . Smoking status: Current Every Day Smoker    Packs/day: 1.00  . Smokeless tobacco: Never Used  . Alcohol use Yes     Comment: daily 2 pints of liquor per day     Allergies   Review of patient's allergies indicates no known allergies.   Review of Systems Review of Systems  Constitutional: Negative for fever.  HENT: Negative for congestion and sore  throat.   Eyes: Negative for visual disturbance.  Respiratory: Negative for cough and shortness of breath.   Cardiovascular: Negative for chest pain.  Gastrointestinal: Negative for abdominal pain, nausea and vomiting.  Genitourinary: Negative for dysuria.  Musculoskeletal: Negative for back pain and neck pain.  Skin: Negative for rash.  Neurological: Negative for headaches.  Psychiatric/Behavioral: Positive for dysphoric mood and suicidal ideas. Negative for hallucinations. The patient is nervous/anxious.      Physical  Exam Updated Vital Signs BP 146/98 (BP Location: Left Arm)   Pulse 99   Temp 98.7 F (37.1 C) (Oral)   Resp 20   Ht 6' (1.829 m)   Wt 59.6 kg   SpO2 96%   BMI 17.83 kg/m   Physical Exam  Constitutional: He is oriented to person, place, and time. He appears well-developed and well-nourished. No distress.  Nontoxic appearing.  HENT:  Head: Normocephalic and atraumatic.  Right Ear: External ear normal.  Left Ear: External ear normal.  Eyes: Conjunctivae are normal. Pupils are equal, round, and reactive to light. Right eye exhibits no discharge. Left eye exhibits no discharge.  Neck: Neck supple.  Cardiovascular: Normal rate, regular rhythm, normal heart sounds and intact distal pulses.   Pulmonary/Chest: Effort normal and breath sounds normal. No respiratory distress.  Abdominal: Soft. There is no tenderness.  Musculoskeletal:  Patient is spontaneously moving all extremities in a coordinated fashion exhibiting good strength.   Lymphadenopathy:    He has no cervical adenopathy.  Neurological: He is alert and oriented to person, place, and time. Coordination normal.  Skin: Skin is warm and dry. No rash noted. He is not diaphoretic. No erythema. No pallor.  Psychiatric: His mood appears anxious. His speech is not rapid and/or pressured and not slurred. He is not actively hallucinating. He exhibits a depressed mood. He expresses suicidal ideation. He expresses no homicidal ideation. He expresses suicidal plans.  Patient endorses depression and anxiety. He appears depressed. He endorses suicidal ideations with a plan to shoot himself. He denies homicidal ideations. He denies visual or auditory hallucinations. He does not appear to be responding to internal stimuli. Speech is clear and coherent.  Nursing note and vitals reviewed.    ED Treatments / Results  Labs (all labs ordered are listed, but only abnormal results are displayed) Labs Reviewed  COMPREHENSIVE METABOLIC PANEL -  Abnormal; Notable for the following:       Result Value   Glucose, Bld 61 (*)    AST 47 (*)    All other components within normal limits  ETHANOL - Abnormal; Notable for the following:    Alcohol, Ethyl (B) 202 (*)    All other components within normal limits  ACETAMINOPHEN LEVEL - Abnormal; Notable for the following:    Acetaminophen (Tylenol), Serum <10 (*)    All other components within normal limits  CBC - Abnormal; Notable for the following:    MCH 35.1 (*)    MCHC 36.4 (*)    RDW 16.0 (*)    All other components within normal limits  SALICYLATE LEVEL  RAPID URINE DRUG SCREEN, HOSP PERFORMED    EKG  EKG Interpretation None       Radiology No results found.  Procedures Procedures (including critical care time)  Medications Ordered in ED Medications  alum & mag hydroxide-simeth (MAALOX/MYLANTA) 200-200-20 MG/5ML suspension 30 mL (not administered)  ondansetron (ZOFRAN) tablet 4 mg (not administered)  nicotine (NICODERM CQ - dosed in mg/24 hours) patch 21 mg (not administered)  ibuprofen (ADVIL,MOTRIN) tablet 600 mg (not administered)     Initial Impression / Assessment and Plan / ED Course  I have reviewed the triage vital signs and the nursing notes.  Pertinent labs & imaging results that were available during my care of the patient were reviewed by me and considered in my medical decision making (see chart for details).  Clinical Course   This is a 45 y.o. Male with a history of mental illness who presents to the emergency department complaining of suicidal ideations today. He reports he has a plan to shoot himself. He reports he has access to guns at home. He reports a long history of depression and alcohol abuse. He reports is been getting gradually worse over the past several months. He reports recently losing his job and this has contributed to his symptoms. He has not been taking his psychiatric medications. He reports drinking alcohol daily. He denies  history complex withdrawal from alcohol. He denies physical complaints. He denies homicidal ideations. The patient is voluntary. Blood work here is remarkable for an alcohol level of 202.  At the time of my evaluation patient does not appear to be clinically intoxicated. His speech is clear and coherent. He denies history of complex alcohol withdrawal. I ordered CIWA protocol.  At this time he is medically clear for behavioral health disposition.   TTS consulted and psych holding orders placed with CIWA protocol.    Final Clinical Impressions(s) / ED Diagnoses   Final diagnoses:  Suicidal ideation    New Prescriptions New Prescriptions   No medications on file     Everlene Farrier, Cordelia Poche 07/17/16 1610    April Palumbo, MD 07/17/16 [phone]

## 2016-07-17 NOTE — ED Notes (Signed)
Pt c/o anxiety, Drenda FreezeFran NP notified.

## 2016-07-17 NOTE — BH Assessment (Addendum)
Tele Assessment Note   Daniel Arroyo is an 45 y.o. male.  -Clinician reviewed note by Everlene FarrierWilliam Dansie, PA.  Daniel Arroyo is a 45 y.o. Male with a history of mental illness who presents to the emergency department complaining of suicidal ideations today. He reports he has a plan to shoot himself. He reports he has access to guns at home. He reports a long history of depression and alcohol abuse. He reports is been getting gradually worse over the past several months. He reports recently losing his job and this has contributed to his symptoms. He has not been taking his psychiatric medications. He reports drinking alcohol daily. He denies history complex withdrawal from alcohol. He denies physical complaints. He denies homicidal ideations. He denies visual or auditory hallucinations. He denies attempts to harm himself tonight. He denies illicit substance abuse.  Patient endorses thoughts of killing himself by using a gun.  He reports having access to guns.  When asked if he had guns in home, he hesitates and says he can get them.  Patient told nursing staff he had rifles, shotguns in the home.  Patient reports waking up in the morning feeling like he "is on the ceiling."  Panic attacks in morning which leads to him drinking to make the panic abate.  He then will use cocaine and marijuana.  Feels guilty for drug use and wants to kill himself.  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  He reports upswing in use of drugs and ETOH since then.  Stressors include finances and job loss.  He is unsure of how stable his housing is going to be.  Pt says he has no real family supports.  He had two brothers who are deceased, ETOH & drugs contributed to their deaths.  Patient reports weight loss of 30 lbs in the last two months.  Patient is using ETOH daily, in the last few months, drinking two pints per day.  Has symptoms of tremors, nausea, vomiting.  No seizures.  Patient  has a DUI charge and has court in November.  Patient uses cocaine and marijuana regularly.  Patient says he cannot afford medications that he has been prescribed in the past as he has no health insurance.  Patient has no outpatient services.  [facility]; on [facility] [address] in August & March 2015.  -Clinician discussed patient care with Nira ConnJason Berry, FNP who recommends inpatient psychiatric care.  [facility] to the services of Dr. Jama Flavorsobos.  [facility] after 10:00 per Randa EvensJoanne [facility]).  Diagnosis: Bipolar d/o, depressed; ETOH use d/o severe; Cocaine use d/o severe; Cannabis use d/o severe; GAD  Past Medical History:  Past Medical History:  Diagnosis Date  . ADHD, adult residual type   . Anxiety   . Anxiety and depression   . Bipolar disorder (HCC)   . Depression   . Gastroesophageal reflux disease   . Knee pain   . Polysubstance abuse     Past Surgical History:  Procedure Laterality Date  . TONSILLECTOMY      Family History: No family history on file.  Social History:  reports that he has been smoking.  He has been smoking about 1.00 pack per day. He has never used smokeless tobacco. He reports that he drinks alcohol. He reports that he uses drugs, including Cocaine, Marijuana, IV, and Heroin.  Additional Social History:  Alcohol /  Drug Use Pain Medications: None Prescriptions: Has hx of having prescriptions but due to losing job, he has no insurance to be able to afford proper medical care. Over the Counter: TUMS History of alcohol / drug use?: Yes Substance #1 Name of Substance 1: ETOH 1 - Age of First Use: 45 years of age 5 - Amount (size/oz): Couple of pints of liquor daily 1 - Frequency: Daily  1 - Duration: Last several months 1 - Last Use / Amount: 10/27 Substance #2 Name of Substance 2: Cocaine 2 - Age of First Use: 45 years of age 13 - Amount (size/oz): Up to a gram per day 2 - Frequency: Daily 2 - Duration: Last couple  of weeks at that rate 2 - Last Use / Amount: 10/27 Substance #3 Name of Substance 3: Marijuana 3 - Age of First Use: Teens 3 - Amount (size/oz): A joint per day 3 - Frequency: Daily use 3 - Duration: on-going 3 - Last Use / Amount: 10/27  CIWA: CIWA-Ar BP: 146/98 Pulse Rate: 99 Nausea and Vomiting: no nausea and no vomiting Tactile Disturbances: very mild itching, pins and needles, burning or numbness Tremor: two Auditory Disturbances: very mild harshness or ability to frighten Paroxysmal Sweats: no sweat visible Visual Disturbances: very mild sensitivity Anxiety: two Headache, Fullness in Head: none present Agitation: normal activity Orientation and Clouding of Sensorium: oriented and can do serial additions CIWA-Ar Total: 7 COWS:    PATIENT STRENGTHS: (choose at least two) Ability for insight Average or above average intelligence Capable of independent living Communication skills  Allergies: No Known Allergies  Home Medications:  (Not in a hospital admission)  OB/GYN Status:  No LMP for male patient.  General Assessment Data Location of Assessment: [facility] TTS Assessment: In system Is this a Tele or Face-to-Face Assessment?: Face-to-Face Is this an Initial Assessment or a Re-assessment for this encounter?: Initial Assessment Marital status: Single Is patient pregnant?: No Pregnancy Status: No Living Arrangements: Alone Can pt return to current living arrangement?: Yes (Pt unsure of how steady his housing is.) Admission Status: Voluntary Is patient capable of signing voluntary admission?: Yes Referral Source: Self/Family/Friend (Pt drove himself to [employer]) Insurance type: N/A     Crisis Care Plan Living Arrangements: Alone Name of Psychiatrist: None Name of Therapist: None  Education Status Is patient currently in school?: No Highest grade of school patient has completed: Masters Degree ([employer])  Risk to self with the past 6 months Suicidal Ideation:  Yes-Currently Present Has patient been a risk to self within the past 6 months prior to admission? : Yes Suicidal Intent: Yes-Currently Present Has patient had any suicidal intent within the past 6 months prior to admission? : Yes Is patient at risk for suicide?: Yes Suicidal Plan?: Yes-Currently Present Has patient had any suicidal plan within the past 6 months prior to admission? : Yes Specify Current Suicidal Plan: Shoot self.  Pt says he has access to guns. Access to Means: Yes Specify Access to Suicidal Means: Pt has access to guns  What has been your use of drugs/alcohol within the last 12 months?: ETOH, cocaine, THC Previous Attempts/Gestures: No How many times?: 0 Other Self Harm Risks: None Triggers for Past Attempts: None known Intentional Self Injurious Behavior: None Family Suicide History: No Recent stressful life event(s): Financial Problems, Job Loss (Lost job 2 months ago.) Persecutory voices/beliefs?: Yes Depression: Yes Depression Symptoms: Despondent, Insomnia, Tearfulness, Guilt, Loss of interest in usual pleasures, Feeling worthless/self pity, Isolating Substance abuse history and/or  treatment for substance abuse?: Yes Suicide prevention information given to non-admitted patients: Not applicable  Risk to Others within the past 6 months Homicidal Ideation: No Does patient have any lifetime risk of violence toward others beyond the six months prior to admission? : No Thoughts of Harm to Others: No Current Homicidal Intent: No Current Homicidal Plan: No Access to Homicidal Means: No Identified Victim: No one History of harm to others?: Yes Assessment of Violence: In distant past Violent Behavior Description: Fights in high school Does patient have access to weapons?: Yes (Comment) (Pt says he can get guns.) Criminal Charges Pending?: Yes Describe Pending Criminal Charges: DUI Does patient have a court date: Yes Court Date: 08/03/16 Is patient on probation?:  No  Psychosis Hallucinations: None noted Delusions: None noted  Mental Status Report Appearance/Hygiene: Disheveled, In scrubs Eye Contact: Fair Motor Activity: Freedom of movement, Unremarkable Speech: Logical/coherent Level of Consciousness: Alert Mood: Depressed, Anxious, Despair, Helpless, Sad Affect: Anxious, Depressed, Blunted Anxiety Level: Panic Attacks Panic attack frequency: Daily, usually in the morning Most recent panic attack: 10/27 Thought Processes: Coherent, Relevant Judgement: Unimpaired Orientation: Person, Place, Time, Situation Obsessive Compulsive Thoughts/Behaviors: None  Cognitive Functioning Concentration: Poor Memory: Recent Intact, Remote Intact IQ: Average Insight: Good Impulse Control: Poor Appetite: Poor Weight Loss:  (30 lbs in last two months.) Weight Gain: 0 Sleep: Decreased Total Hours of Sleep:  (<4H/D) Vegetative Symptoms: Staying in bed, Decreased grooming  ADLScreening [facility]) Patient's cognitive ability adequate to safely complete daily activities?: Yes Patient able to express need for assistance with ADLs?: Yes Independently performs ADLs?: Yes (appropriate for developmental age)  Prior Inpatient Therapy Prior Inpatient Therapy: Yes Prior Therapy Dates: 2011, 2015, 2017 [facility] Reason for Treatment: suicidal thoughts  Prior Outpatient Therapy Prior Outpatient Therapy: Yes Prior Therapy Dates: Not sure of when Prior Therapy Facilty/Provider(s): pt could not recall Reason for Treatment: alcohol abuse Does patient have an ACCT team?: No Does patient have Intensive In-House Services?  : No Does patient have [facility] services? : No Does patient have P4CC services?: No  ADL Screening (condition at time of admission) Patient's cognitive ability adequate to safely complete daily activities?: Yes Is the patient deaf or have difficulty hearing?: No Does the patient have difficulty  seeing, even when wearing glasses/contacts?: No Does the patient have difficulty concentrating, remembering, or making decisions?: No Patient able to express need for assistance with ADLs?: Yes Does the patient have difficulty dressing or bathing?: No Independently performs ADLs?: Yes (appropriate for developmental age) Does the patient have difficulty walking or climbing stairs?: No Weakness of Legs: None Weakness of Arms/Hands: None  Home Assistive Devices/Equipment Home Assistive Devices/Equipment: None    Abuse/Neglect Assessment (Assessment to be complete while patient is alone) Physical Abuse: Denies Verbal Abuse: Denies Sexual Abuse: Denies Exploitation of patient/patient's resources: Denies Self-Neglect: Denies     Merchant navy officerAdvance Directives (For Healthcare) Does patient have an advance directive?: No Would patient like information on creating an advanced directive?: No - patient declined information    Additional Information 1:1 In Past 12 Months?: No CIRT Risk: No Elopement Risk: No Does patient have medical clearance?: Yes     Disposition:  Disposition Initial Assessment Completed for this Encounter: Yes Disposition of Patient: Inpatient treatment program, Referred to Type of inpatient treatment program: Adult Patient referred to: Other (Comment) (To be reviewed with NP)  Beatriz StallionHarvey, Vicci Reder Ray 07/17/2016 5:20 AM

## 2016-07-17 NOTE — Tx Team (Signed)
Initial Treatment Plan 07/17/2016 2:20 PM Daniel [name] Arroyo ZOX:[id number]    PATIENT STRESSORS: Financial difficulties Legal issue Occupational concerns Substance abuse   PATIENT STRENGTHS: Capable of independent living Wellsite geologistCommunication skills General fund of knowledge   PATIENT IDENTIFIED PROBLEMS: Depression  Anxiety  Substance abuse  Suicidal ideation  "Get into a residential treatment center like [facility]"  "Get my medications straightened out"           DISCHARGE CRITERIA:  Improved stabilization in mood, thinking, and/or behavior Verbal commitment to aftercare and medication compliance Withdrawal symptoms are absent or subacute and managed without 24-hour nursing intervention  PRELIMINARY DISCHARGE PLAN: Outpatient therapy Residential treatment  PATIENT/FAMILY INVOLVEMENT: This treatment plan has been presented to and reviewed with the patient, Daniel Arroyo.  The patient and family have been given the opportunity to ask questions and make suggestions.  Levin BaconHeather V Corrinna Karapetyan, RN 07/17/2016, 2:20 PM

## 2016-07-17 NOTE — Progress Notes (Signed)
Patient ID: Gaspar BiddingRonnie Ruff, male   DOB: Mar 23, 1971, 45 y.o.   MRN: [id number]   Pt currently presents with a flat affect and depressed behavior. Pt reports to writer that their goal is to "get into long term treatment somewhere like [facility]." Pt reports good sleep with current medication regimen. Pt reports signs and symptoms of withdrawal including tremors, cold sweats, and generalized pins and needles.   Pt provided with medications per providers orders. Pt's labs and vitals were monitored throughout the night. Pt supported emotionally and encouraged to express concerns and questions. Pt educated on medications.  Pt's safety ensured with 15 minute and environmental checks. Pt reports passive SI, no current plan. Pt currently denies HI and A/V hallucinations. Pt verbally agrees to seek staff if HI or A/VH occurs and to consult with staff before acting on any harmful thoughts. Will continue POC.

## 2016-07-17 NOTE — Progress Notes (Signed)
Daniel Arroyo has spent the late afternoon asleep in his bed. He is depressed, flat and avoiding eye contact with staff. He was able to go to the cafe' for his dinner and he tolerated fair. A He is taking his meds as scheduled, educated about the ativan . hHe deneis active SI and withdrawal side effects.Safety in place.

## 2016-07-17 NOTE — Progress Notes (Signed)
Daniel [name] is a 45 year old male being admitted voluntarily to [facility] from [facility]. He presented to the emergency department complaining of suicidal ideation with a plan to shoot himself. He reported a long history of depression and alcohol abuse that has been getting gradually worse over the past several months. Recent stressor is losing his. He has not been taking his psychiatric medications. He denies HI or A/V hallucinations.  He reported having panic attacks and will use cocaine/marijuana to help which leads guilty feelings.  He is diagnosed with Bipolar disorder, depressed; ETOH use disorder severe; Cocaine use disorder severe; Cannabis use disorder severe and generalized anxiety disorder.  Blood pressure elevated.  CIWA 7 and COWS 4.  Informed provider that he is not currently on a detox protocol.  Oriented him to the unit.  Admission paperwork completed and signed.  Belongings searched and secured in locker # 41.  Skin assessment completed and no skin noted.  Q 15 minute checks initiated for safety.  We will monitor the progress towards his goals.

## 2016-07-17 NOTE — ED Notes (Signed)
SBAR Report received from previous nurse. Pt received calm and visible on unit. Pt denies current SI/ HI, A/V H, depression, anxiety, and pain at this time, and is otherwise stable. Pt reports that his substance abuse makes him depressed and suicidal. Pt reminded of camera surveillance, q 15 min rounds, and rules of the milieu. Pt screened for contraband by Clinical research associatewriter, will continue to assess.

## 2016-07-17 NOTE — ED Notes (Signed)
[facility] on unit [facility] per MD order. Pt signed for personal property and property given to [facility] for transfer. Pt signed e-signature, Ambulatory off unit.

## 2016-07-18 DIAGNOSIS — F331 Major depressive disorder, recurrent, moderate: Secondary | ICD-10-CM

## 2016-07-18 DIAGNOSIS — Z79899 Other long term (current) drug therapy: Secondary | ICD-10-CM

## 2016-07-18 DIAGNOSIS — R45851 Suicidal ideations: Secondary | ICD-10-CM

## 2016-07-18 MED ORDER — MIRTAZAPINE 7.5 MG PO TABS
7.5000 mg | ORAL_TABLET | Freq: Every day | ORAL | Status: DC
  Administered 2016-07-18: 7.5 mg via ORAL
  Filled 2016-07-18 (×3): qty 1

## 2016-07-18 MED ORDER — NICOTINE 21 MG/24HR TD PT24
21.0000 mg | MEDICATED_PATCH | Freq: Every day | TRANSDERMAL | Status: DC
  Administered 2016-07-18 – 2016-07-20 (×3): 21 mg via TRANSDERMAL
  Filled 2016-07-18 (×5): qty 1
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
## 2016-07-18 NOTE — [facility] Group Notes (Signed)
[facility] LCSW Group Therapy  07/18/2016 10 - 10:55 AM  Type of Therapy:  Group Therapy  Participation Level:  Did Not Attend; invited to participate yet did not despite overhead announcement and encouragement by staff    Catherine C Harrill   

## 2016-07-18 NOTE — H&P (Signed)
Psychiatric Admission Assessment Adult  Patient Identification: Daniel Arroyo MRN:  [id number] Date of Evaluation:  07/18/2016 Chief Complaint:  bipolar disorder etoh use disorder cocaine disorder cannabis use disorder gad Principal Diagnosis: Major depressive disorder, recurrent ([place]) Diagnosis:   Patient Active Problem List   Diagnosis Date Noted  . Major depressive disorder, recurrent ([place]) [F33.9] 07/17/2016  . Alcohol-induced mood disorder ([place]) [F10.94] 06/10/2016  . Cocaine dependence ([place]) [F14.20] 04/23/2014  . Substance induced mood disorder ([place]) [F19.94] 04/23/2014  . MDD (major depressive disorder) ([place]) [F32.9] 04/22/2014  . Alcohol use disorder, severe, dependence ([place]) [F10.20] 12/14/2013  . Adult ADHD [F90.9] 12/14/2013  . Polysubstance abuse [F19.10] 12/13/2013  . Alcohol withdrawal ([place]) [F10.239] 10/28/2013   History of Present Illness: Daniel Arroyo is an 45 y.o. male.  -Clinician reviewed note by [name], PA.  Daniel Arroyo is a 45 y.o. Male with a history of mental illness who presents to the emergency department complaining of suicidal ideations today. He reports he has a plan to shoot himself. He reports he has access to guns at home. He reports a long history of depression and alcohol abuse. He reports is been getting gradually worse over the past several months. He reports recently losing his job and this has contributed to his symptoms. He has not been taking his psychiatric medications. He reports drinking alcohol daily. He denies history complex withdrawal from alcohol. He denies physical complaints. He denies homicidal ideations. He denies visual or auditory hallucinations. He denies attempts to harm himself tonight. He denies illicit substance abuse.  Patient endorses thoughts of killing himself by using a gun.  He reports having access to guns.  When asked if he had guns in home, he hesitates and says he can get them.  Patient told nursing  staff he had rifles, shotguns in the home.  Patient reports waking up in the morning feeling like he "is on the ceiling."  Panic attacks in morning which leads to him drinking to make the panic abate.  He then will use cocaine and marijuana.  Feels guilty for drug use and wants to kill himself.  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  He reports upswing in use of drugs and ETOH since then.  Stressors include finances and job loss.  He is unsure of how stable his housing is going to be.  Pt says he has no real family supports.  He had two brothers who are deceased, ETOH & drugs contributed to their deaths.  Patient reports weight loss of 30 lbs in the last two months.  Patient is using ETOH daily, in the last few months, drinking two pints per day.  Has symptoms of tremors, nausea, vomiting.  No seizures.  Patient has a DUI charge and has court in November.  Patient uses cocaine and marijuana regularly.  Patient says he cannot afford medications that he has been prescribed in the past as he has no health insurance.  Patient has no outpatient services.  He was at [facility] in [place] in September '17; on [address] in August & March 2015.  On Evaluation:Daniel Arroyo is awake, alert and oriented *4. Seen resting in the bedroom  Denies suicidal or homicidal ideation during this assessement. Denies auditory or visual hallucination and does not appear to be responding to internal stimuli. Patient validates inform,ation provided above. Support, encouragement and reassurance was provided.   Associated Signs/Symptoms: Depression Symptoms:  depressed mood, insomnia, fatigue, feelings of  worthlessness/guilt, difficulty concentrating, hopelessness, recurrent thoughts of death, suicidal thoughts without plan, (Hypo) Manic Symptoms:  Distractibility, Labiality of Mood, Anxiety Symptoms:  Excessive Worry, Social Anxiety, Psychotic Symptoms:  Hallucinations: None PTSD  Symptoms: Avoidance:  Decreased Interest/Participation Total Time spent with patient: 45 minutes  Past Psychiatric History:   Is the patient at risk to self? Yes.    Has the patient been a risk to self in the past 6 months? Yes.    Has the patient been a risk to self within the distant past? No.  Is the patient a risk to others? No.  Has the patient been a risk to others in the past 6 months? No.  Has the patient been a risk to others within the distant past? No.   Prior Inpatient Therapy:   Prior Outpatient Therapy:    Alcohol Screening: 1. How often do you have a drink containing alcohol?: 4 or more times a week 2. How many drinks containing alcohol do you have on a typical day when you are drinking?: 5 or 6 3. How often do you have six or more drinks on one occasion?: Weekly Preliminary Score: 5 4. How often during the last year have you found that you were not able to stop drinking once you had started?: Daily or almost daily 5. How often during the last year have you failed to do what was normally expected from you becasue of drinking?: Monthly 6. How often during the last year have you needed a first drink in the morning to get yourself going after a heavy drinking session?: Daily or almost daily 7. How often during the last year have you had a feeling of guilt of remorse after drinking?: Daily or almost daily 8. How often during the last year have you been unable to remember what happened the night before because you had been drinking?: Weekly 9. Have you or someone else been injured as a result of your drinking?: No 10. Has a relative or friend or a doctor or another health worker been concerned about your drinking or suggested you cut down?: Yes, during the last year Alcohol Use Disorder Identification Test Final Score (AUDIT): 30 Brief Intervention: Yes Substance Abuse History in the last 12 months:  Yes.   Consequences of Substance Abuse: Withdrawal Symptoms:    Headaches Nausea Tremors Previous Psychotropic Medications: YES Psychological Evaluations: YES Past Medical History:  Past Medical History:  Diagnosis Date  . ADHD, adult residual type   . Anxiety   . Anxiety and depression   . Bipolar disorder ([place])   . Depression   . Gastroesophageal reflux disease   . Knee pain   . Polysubstance abuse     Past Surgical History:  Procedure Laterality Date  . TONSILLECTOMY     Family History: History reviewed. No pertinent family history. Family Psychiatric  History:  Tobacco Screening: Have you used any form of tobacco in the last 30 days? (Cigarettes, Smokeless Tobacco, Cigars, and/or Pipes): Yes Tobacco use, Select all that apply: 5 or more cigarettes per day Are you interested in Tobacco Cessation Medications?: Yes, will notify MD for an order Counseled patient on smoking cessation including recognizing danger situations, developing coping skills and basic information about quitting provided: Refused/Declined practical counseling Social History:  History  Alcohol Use  . Yes    Comment: daily 2 pints of liquor per day     History  Drug Use  . Types: Cocaine, Marijuana, IV, Heroin    Comment: heroin  Additional Social History: Marital status: Divorced Divorced, when?: 2007 What types of issues is patient dealing with in the relationship?: Pt's Substance abuse issues Are you sexually active?: No What is your sexual orientation?: Heterosexual Has your sexual activity been affected by drugs, alcohol, medication, or emotional stress?: "Probably" Does patient have children?: Yes How is patient's relationship with their children?: Patient reports he rarely sees his children    Pain Medications: None Prescriptions: Has hx of having prescriptions but due to losing job, he has no insurance to be able to afford proper medical care. Over the Counter: TUMS History of alcohol / drug use?: Yes Longest period of sobriety (when/how long):  unknown Negative Consequences of Use: Financial, Legal Withdrawal Symptoms: Agitation, Cramps, Diarrhea, Fever / Chills, Irritability, Nausea / Vomiting, Patient aware of relationship between substance abuse and physical/medical complications, Sweats, Tingling, Weakness Name of Substance 1: ETOH 1 - Age of First Use: 45 years of age 80 - Amount (size/oz): Couple of pints of liquor daily 1 - Frequency: Daily  1 - Duration: Last several months 1 - Last Use / Amount: 10/27 Name of Substance 2: Cocaine 2 - Age of First Use: 45 years of age 58 - Amount (size/oz): Up to a gram per day 2 - Frequency: Daily 2 - Duration: Last couple of weeks at that rate 2 - Last Use / Amount: 10/27 Name of Substance 3: Marijuana 3 - Age of First Use: Teens 3 - Amount (size/oz): A joint per day 3 - Frequency: Daily use 3 - Duration: on-going 3 - Last Use / Amount: 10/27              Allergies:  No Known Allergies Lab Results:  Results for orders placed or performed during the hospital encounter of 07/16/16 (from the past 48 hour(s))  Comprehensive metabolic panel     Status: Abnormal   Collection Time: 07/17/16 12:01 AM  Result Value Ref Range   Sodium 140 135 - 145 mmol/L   Potassium 3.5 3.5 - 5.1 mmol/L   Chloride 108 101 - 111 mmol/L   CO2 22 22 - 32 mmol/L   Glucose, Bld 61 (L) 65 - 99 mg/dL   BUN 6 6 - 20 mg/dL   Creatinine, Ser 0.80 0.61 - 1.24 mg/dL   Calcium 9.3 8.9 - 10.3 mg/dL   Total Protein 6.7 6.5 - 8.1 g/dL   Albumin 4.1 3.5 - 5.0 g/dL   AST 47 (H) 15 - 41 U/L   ALT 29 17 - 63 U/L   Alkaline Phosphatase 77 38 - 126 U/L   Total Bilirubin 0.4 0.3 - 1.2 mg/dL   GFR calc non Af Amer >60 >60 mL/min   GFR calc Af Amer >60 >60 mL/min    Comment: (NOTE) The eGFR has been calculated using the CKD EPI equation. This calculation has not been validated in all clinical situations. eGFR's persistently <60 mL/min signify possible Chronic Kidney Disease.    Anion gap 10 5 - 15  Ethanol      Status: Abnormal   Collection Time: 07/17/16 12:01 AM  Result Value Ref Range   Alcohol, Ethyl (B) 202 (H) <5 mg/dL    Comment:        LOWEST DETECTABLE LIMIT FOR SERUM ALCOHOL IS 5 mg/dL FOR MEDICAL PURPOSES ONLY   Salicylate level     Status: None   Collection Time: 07/17/16 12:01 AM  Result Value Ref Range   Salicylate Lvl <2.8 2.8 - 30.0 mg/dL  Acetaminophen  level     Status: Abnormal   Collection Time: 07/17/16 12:01 AM  Result Value Ref Range   Acetaminophen (Tylenol), Serum <10 (L) 10 - 30 ug/mL    Comment:        THERAPEUTIC CONCENTRATIONS VARY SIGNIFICANTLY. A RANGE OF 10-30 ug/mL MAY BE AN EFFECTIVE CONCENTRATION FOR MANY PATIENTS. HOWEVER, SOME ARE BEST TREATED AT CONCENTRATIONS OUTSIDE THIS RANGE. ACETAMINOPHEN CONCENTRATIONS >150 ug/mL AT 4 HOURS AFTER INGESTION AND >50 ug/mL AT 12 HOURS AFTER INGESTION ARE OFTEN ASSOCIATED WITH TOXIC REACTIONS.   cbc     Status: Abnormal   Collection Time: 07/17/16 12:01 AM  Result Value Ref Range   WBC 5.6 4.0 - 10.5 K/uL   RBC 4.33 4.22 - 5.81 MIL/uL   Hemoglobin 15.2 13.0 - 17.0 g/dL   HCT 41.8 39.0 - 52.0 %   MCV 96.5 78.0 - 100.0 fL   MCH 35.1 (H) 26.0 - 34.0 pg   MCHC 36.4 (H) 30.0 - 36.0 g/dL   RDW 16.0 (H) 11.5 - 15.5 %   Platelets 321 150 - 400 K/uL  Rapid urine drug screen (hospital performed)     Status: Abnormal   Collection Time: 07/17/16  5:01 AM  Result Value Ref Range   Opiates NONE DETECTED NONE DETECTED   Cocaine POSITIVE (A) NONE DETECTED   Benzodiazepines NONE DETECTED NONE DETECTED   Amphetamines NONE DETECTED NONE DETECTED   Tetrahydrocannabinol POSITIVE (A) NONE DETECTED   Barbiturates NONE DETECTED NONE DETECTED    Comment:        DRUG SCREEN FOR MEDICAL PURPOSES ONLY.  IF CONFIRMATION IS NEEDED FOR ANY PURPOSE, NOTIFY LAB WITHIN 5 DAYS.        LOWEST DETECTABLE LIMITS FOR URINE DRUG SCREEN Drug Class       Cutoff (ng/mL) Amphetamine      1000 Barbiturate      200 Benzodiazepine    341 Tricyclics       962 Opiates          300 Cocaine          300 THC              50     Blood Alcohol level:  Lab Results  Component Value Date   ETH 202 (H) 07/17/2016   ETH 169 (H) 22/97/9892    Metabolic Disorder Labs:  Lab Results  Component Value Date   HGBA1C 4.9 12/14/2013   MPG 94 12/14/2013   No results found for: PROLACTIN No results found for: CHOL, TRIG, HDL, CHOLHDL, VLDL, LDLCALC  Current Medications: Current Facility-Administered Medications  Medication Dose Route Frequency Provider Last Rate Last Dose  . acetaminophen (TYLENOL) tablet 650 mg  650 mg Oral Q6H PRN Rozetta Nunnery, NP      . alum & mag hydroxide-simeth (MAALOX/MYLANTA) 200-200-20 MG/5ML suspension 30 mL  30 mL Oral Q4H PRN Rozetta Nunnery, NP      . feeding supplement (ENSURE ENLIVE) (ENSURE ENLIVE) liquid 237 mL  237 mL Oral BID BM Norman Clay, MD   237 mL at 07/17/16 1530  . hydrOXYzine (ATARAX/VISTARIL) tablet 25 mg  25 mg Oral Q6H PRN Norman Clay, MD      . Influenza vac split quadrivalent PF (FLUARIX) injection 0.5 mL  0.5 mL Intramuscular Tomorrow-1000 Linard Millers, MD      . loperamide (IMODIUM) capsule 2-4 mg  2-4 mg Oral PRN Norman Clay, MD      . LORazepam (ATIVAN) tablet 1 mg  1 mg Oral Q6H PRN Norman Clay, MD      . LORazepam (ATIVAN) tablet 1 mg  1 mg Oral QID Norman Clay, MD   1 mg at 07/18/16 1200   Followed by  . [START ON 07/19/2016] LORazepam (ATIVAN) tablet 1 mg  1 mg Oral TID Norman Clay, MD   1 mg at 07/18/16 1315   Followed by  . [START ON 07/20/2016] LORazepam (ATIVAN) tablet 1 mg  1 mg Oral BID Norman Clay, MD       Followed by  . [START ON 07/21/2016] LORazepam (ATIVAN) tablet 1 mg  1 mg Oral Daily Norman Clay, MD      . magnesium hydroxide (MILK OF MAGNESIA) suspension 30 mL  30 mL Oral Daily PRN Rozetta Nunnery, NP      . mirtazapine (REMERON) tablet 7.5 mg  7.5 mg Oral QHS Norman Clay, MD      . multivitamin with minerals tablet 1 tablet  1 tablet Oral  Daily Norman Clay, MD   1 tablet at 07/18/16 0946  . nicotine (NICODERM CQ - dosed in mg/24 hours) patch 21 mg  21 mg Transdermal Q0600 Rozetta Nunnery, NP   21 mg at 07/18/16 0949  . ondansetron (ZOFRAN-ODT) disintegrating tablet 4 mg  4 mg Oral Q6H PRN Norman Clay, MD      . pneumococcal 23 valent vaccine (PNU-IMMUNE) injection 0.5 mL  0.5 mL Intramuscular Tomorrow-1000 Linard Millers, MD      . thiamine (B-1) injection 100 mg  100 mg Intramuscular Once Norman Clay, MD      . thiamine (VITAMIN B-1) tablet 100 mg  100 mg Oral Daily Norman Clay, MD   100 mg at 07/18/16 0946   PTA Medications: Prescriptions Prior to Admission  Medication Sig Dispense Refill Last Dose  . calcium carbonate (TUMS - DOSED IN MG ELEMENTAL CALCIUM) 500 MG chewable tablet Chew 1-2 tablets by mouth daily as needed for indigestion or heartburn.   Past Week at Unknown time  . cloNIDine (CATAPRES) 0.1 MG tablet Take 1 tablet (0.1 mg total) by mouth 2 (two) times daily. For high blood pressure (Patient not taking: Reported on 07/16/2016) 60 tablet 11 Not Taking at Unknown time  . divalproex (DEPAKOTE ER) 250 MG 24 hr tablet Take 1 tablet (250 mg total) by mouth 3 (three) times daily. (Patient not taking: Reported on 07/16/2016) 42 tablet 0 Not Taking at Unknown time  . gabapentin (NEURONTIN) 100 MG capsule Take 1 capsule (100 mg total) by mouth 3 (three) times daily. (Patient not taking: Reported on 07/16/2016) 42 capsule 0 Not Taking at Unknown time  . hydrOXYzine (ATARAX/VISTARIL) 25 MG tablet Take 1 tablet (25 mg total) by mouth every 6 (six) hours as needed for anxiety. (Patient not taking: Reported on 07/16/2016) 30 tablet 0 Not Taking at Unknown time    Musculoskeletal: Strength & Muscle Tone: within normal limits Gait & Station: normal Patient leans: N/A  Psychiatric Specialty Exam: Physical Exam  Nursing note and vitals reviewed. Constitutional: He is oriented to person, place, and time. He appears  well-developed.  Neurological: He is alert and oriented to person, place, and time.  Skin: Skin is warm and dry.  Psychiatric: He has a normal mood and affect. His behavior is normal.    Review of Systems  Psychiatric/Behavioral: Positive for depression, substance abuse and suicidal ideas. The patient is nervous/anxious and has insomnia.     Blood pressure (!) 132/94, pulse (!) 102, temperature 97.9  F (36.6 C), temperature source Oral, resp. rate 16, height 6' (1.829 m), weight 70.8 kg (156 lb), SpO2 98 %.Body mass index is 21.16 kg/m.  General Appearance: Casual and Guarded  Eye Contact:  Good  Speech:  Clear and Coherent  Volume:  Normal  Mood:  Depressed  Affect:  Congruent  Thought Process:  Coherent  Orientation:  Full (Time, Place, and Person)  Thought Content:  Hallucinations: None  Suicidal Thoughts:  Yes.  without intent/plan  Homicidal Thoughts:  No  Memory:  Immediate;   Fair Recent;   Fair  Judgement:  Intact  Insight:  Fair  Psychomotor Activity:  Restlessness  Concentration:  Concentration: Fair  Recall:  [employer] of Knowledge:  Fair  Language:  Fair  Akathisia:  No  Handed:  Right  AIMS (if indicated):     Assets:  Communication Skills Desire for Improvement Social Support  ADL's:  Intact  Cognition:  WNL  Sleep:  Number of Hours: 6.25     I agree with current treatment plan on 07/18/2016, Patient seen face-to-face for psychiatric evaluation follow-up, chart reviewed and case discussed with the MD [place]. Reviewed the information documented and agree with the treatment plan.  Treatment Plan Summary: Daily contact with patient to assess and evaluate symptoms and progress in treatment and Medication management   Consider restarting antidepressant ( Depakote 250 mg) Continue with Remeron 7.67m for insomnia Started on CWIA/AtivanProtocol Will continue to monitor vitals ,medication compliance and treatment side effects while patient is here.  Reviewed  lab: BAL - 202, UDS - pos for cocaine and thc CSW will start working on disposition.  Patient to participate in therapeutic milieu   Observation Level/Precautions:  15 minute checks  Laboratory:  CBC Chemistry Profile UDS UA  Psychotherapy:  Individual and group session  Medications:  See above  Consultations:  psychiatry  Discharge Concerns:  Safety, stabilization, and risk of access to medication and medication stabilization   Estimated LOS:5-7 days  Other:     Physician Treatment Plan for Primary Diagnosis: Major depressive disorder, recurrent ([place] Long Term Goal(s): Improvement in symptoms so as ready for discharge  Short Term Goals: Ability to verbalize feelings will improve, Ability to disclose and discuss suicidal ideas and Ability to demonstrate self-control will improve  Physician Treatment Plan for Secondary Diagnosis: Principal Problem:   Major depressive disorder, recurrent ([place]  Long Term Goal(s): Improvement in symptoms so as ready for discharge  Short Term Goals: Ability to identify changes in lifestyle to reduce recurrence of condition will improve, Ability to demonstrate self-control will improve, Ability to identify and develop effective coping behaviors will improve and Ability to identify triggers associated with substance abuse/mental health issues will improve  I certify that inpatient services furnished can reasonably be expected to improve the patient's condition.    [place]ill [place] NP 10/29/20174:11 PM

## 2016-07-18 NOTE — Progress Notes (Signed)
Pt is alert and awake on unit at shift change in his room.  Pt c/o of beginning symptoms of ETOH withdrawal and is requesting medications.   Scheduled medications given and pt returns in 30 minuets requesting more ativan.  Advised pt to wait another 30 min to see if first does worked.  Pt CIWA exceeds 10 at last check. Pt in room in bed sleeping.  Pt remains safe and q 15 min checks in place.

## 2016-07-18 NOTE — [facility] Counselor (Signed)
Second PSA attempt shortly after 12:45 was unsuccessful as patient anxiously awaiting PRN medications and lunch tray. Will attempt once more following CSW group responsibilities.   Daniel Bernatherine C Deatrice Spanbauer, LCSW

## 2016-07-18 NOTE — [facility] Counselor (Signed)
PSA attempt at 11:05 was unsuccessful as patient would not respond to his name being called repeatedly as CSW stood at patient's bed with lights on in room.  CSW to attempt once again later today.   Carney Bernatherine C Patriciaann Rabanal, LCSW

## 2016-07-18 NOTE — [facility] Suicide Risk Assessment (Signed)
[facility]   Nursing information obtained from:  Patient Demographic factors:  Male, Unemployed Current Mental Status:  Suicidal ideation indicated by patient Loss Factors:  Financial problems / change in socioeconomic status, Legal issues, Decrease in vocational status Historical Factors:  Family history of mental illness or substance abuse, Impulsivity Risk Reduction Factors:  Sense of responsibility to family  Total Time spent with patient: 20 minutes Principal Problem: Major depressive disorder, recurrent (HCC) Diagnosis:   Patient Active Problem List   Diagnosis Date Noted  . Major depressive disorder, recurrent (HCC) [F33.9] 07/17/2016  . Alcohol-induced mood disorder (HCC) [F10.94] 06/10/2016  . Cocaine dependence (HCC) [F14.20] 04/23/2014  . Substance induced mood disorder (HCC) [F19.94] 04/23/2014  . MDD (major depressive disorder) (HCC) [F32.9] 04/22/2014  . Alcohol use disorder, severe, dependence (HCC) [F10.20] 12/14/2013  . Adult ADHD [F90.9] 12/14/2013  . Polysubstance abuse [F19.10] 12/13/2013  . Alcohol withdrawal [facility](HCC) [F10.239] 10/28/2013   Subjective Data:  Daniel Arroyo is a 45 year old male with depression, alcohol use disorder, who presents with SI with plan to shoot himself in the setting of alcohol use and non adherence to medication. [facility], was on depakote, gabapentin. Labs: EtOH 202 10/28/ 0AM, UDS posistive for cocaine, THC  Patient states that he did not follow through the plans after discharge. He states he could not go to the appointment, as he did not have transportation. He will be homeless, although he used to live in the group home. He feels stressed about unemployment for the past few months.  He had a plan to shoot himself, as he has access at his parents house. He endorses depression and craving for alcohol. He relapsed on alcohol, drinking 2 pints of liquor and uses cocaine and money  1 daily. He wants to be back on Mirtazapine. He reports some nightmares from trazodone.   He denies decreased need for sleep. The longest sobriety was for 5 years; he finds AA meeting to be very helpful. [facility]   Continued Clinical Symptoms:  Alcohol Use Disorder Identification Test Final Score (AUDIT): 30 The "Alcohol Use Disorders Identification Test", Guidelines for Use in Primary Care, Second Edition.  World Science writerHealth Organization [facility](WHO). Score between 0-7:  no or low risk or alcohol related problems. Score between 8-15:  moderate risk of alcohol related problems. Score between 16-19:  high risk of alcohol related problems. Score 20 or above:  warrants further diagnostic evaluation for alcohol dependence and treatment.   CLINICAL FACTORS:   Depression:   Anhedonia Comorbid alcohol abuse/dependence   Musculoskeletal: Strength & Muscle Tone: within normal limits Gait & Station: normal Patient leans: N/A  Psychiatric Specialty Exam: Physical Exam  Nursing note and vitals reviewed. Constitutional: He is oriented to person, place, and time.  Neurological: He is alert and oriented to person, place, and time.  Bilateral hand tremors    Review of Systems  Cardiovascular: Positive for palpitations.  Neurological: Positive for tremors.  Psychiatric/Behavioral: Positive for depression and substance abuse. Negative for hallucinations and suicidal ideas. The patient is nervous/anxious and has insomnia.     Blood pressure (!) 132/94, pulse (!) 102, temperature 97.9 F (36.6 C), temperature source Oral, resp. rate 16, height 6' (1.829 m), weight 156 lb (70.8 kg), SpO2 98 %.Body mass index is 21.16 kg/m.  General Appearance: Disheveled  Eye Contact:  Minimal  Speech:  Clear and Coherent  Volume:  Normal  Mood:  Depressed  Affect:  anxious  Thought Process:  Coherent and Goal Directed  Orientation:  Full (Time, Place, and Person)  Thought Content:  Logical  Perceptions: denies AH/VH  Suicidal Thoughts:  No  Homicidal Thoughts:  No  Memory:  Immediate;   Good Recent;   Good Remote;   Good  Judgement:  Fair  Insight:  Fair  Psychomotor Activity:  Normal  Concentration:  Concentration: Good and Attention Span: Good  Recall:  Good  Fund of Knowledge:  Good  Language:  Good  Akathisia:  No  Handed:  Right  AIMS (if indicated):     Assets:  Communication Skills Desire for Improvement  ADL's:  Intact  Cognition:  WNL  Sleep:  Number of Hours: 6.25      COGNITIVE FEATURES THAT CONTRIBUTE TO RISK:  Closed-mindedness    SUICIDE RISK:   Mild:  Suicidal ideation of limited frequency, intensity, duration, and specificity.  There are no identifiable plans, no associated intent, mild dysphoria and related symptoms, good self-control (both objective and subjective assessment), few other risk factors, and identifiable protective factors, including available and accessible social support.   PLAN OF CARE: Patient will be admitted to inpatient psychiatric unit for stabilization and safety. Will provide and encourage milieu participation. Provide medication management and maked adjustments as needed.  Will follow daily.   Daniel BiddingRonnie Arroyo is a 45 year old male with depression, alcohol use disorder, who presents with SI with plan to shoot himself in the setting of alcohol use and non adherence to medication. [facility] with [facility] use. Will start mirtazapine to target his neurovegetative symptoms. He is a good candidate for naltrexone; will recheck LFT. Discontinue Trazodone given adverse event of nightmares.  I certify that inpatient services furnished can reasonably be expected to improve the patient's condition.  Neysa Hottereina Jakira Mcfadden, MD 07/18/2016, 10:54 AM

## 2016-07-18 NOTE — Progress Notes (Signed)
[name] is looking much better today and this is evidenced by him having freshly shaved beard ( last night ), he is wearing clean scrubs and he is not as tremulous today. He is attending his groups, he is taking his meds as scheduled . A He completes his daily assessment, states he has had SI today , he rated his depression, hopelessness and anxiety " 04/27/09", respectively. He agress with this nurse that he is feeling BETTER too. R Safety in place and poc cont. No complications with detox.

## 2016-07-19 DIAGNOSIS — F1020 Alcohol dependence, uncomplicated: Secondary | ICD-10-CM

## 2016-07-19 MED ORDER — NALTREXONE HCL 50 MG PO TABS
50.0000 mg | ORAL_TABLET | Freq: Every day | ORAL | Status: DC
  Administered 2016-07-20: 50 mg via ORAL
  Filled 2016-07-19 (×3): qty 1

## 2016-07-19 MED ORDER — MIRTAZAPINE 15 MG PO TABS
15.0000 mg | ORAL_TABLET | Freq: Every day | ORAL | Status: DC
  Administered 2016-07-19: 15 mg via ORAL
  Filled 2016-07-19 (×4): qty 1

## 2016-07-19 MED ORDER — PANTOPRAZOLE SODIUM 20 MG PO TBEC
20.0000 mg | DELAYED_RELEASE_TABLET | Freq: Every day | ORAL | Status: DC
  Administered 2016-07-20: 20 mg via ORAL
  Filled 2016-07-19 (×3): qty 1

## 2016-07-19 NOTE — [facility] Group Notes (Signed)
Daniel Arroyo was invited to attend group.   Did not attend     spiritual care group on grief and loss facilitated by chaplain Daniel KingfisherMatthew Lynnae Arroyo   Group opened with brief discussion and psycho-social ed around grief and loss in relationships and in relation to self - identifying life patterns, circumstances, changes that cause losses. Established group norm of speaking from own life experience. Group goal of establishing open and affirming space for members to share loss and experience with grief, normalize grief experience and provide psycho social education and grief support.

## 2016-07-19 NOTE — Progress Notes (Signed)
Recreation Therapy Notes  Date: 07/19/16 Time: 0930 Location: [address] Dayroom  Group Topic: Stress Management  Goal Area(s) Addresses:  Patient will verbalize importance of using healthy stress management.  Patient will identify positive emotions associated with healthy stress management.   Intervention: Calm App  Activity :  Body Scan Meditation.  LRT introduced the stress management technique of meditation.  LRT played a body scan meditation from the Calm app to allow the patients to engage in the activity.  Patients were to follow along as the meditation was being played.  Education:  Stress Management, Discharge Planning.   Education Outcome: Acknowledges edcuation/In group clarification offered/Needs additional education  Clinical Observations/Feedback: Pt did not attend group.    Caroll RancherMarjette Naquisha Whitehair, LRT/CTRS         [name], Laquita Harlan A 07/19/2016 12:14 PM

## 2016-07-19 NOTE — [facility] Group Notes (Signed)
[facility] LCSW Group Therapy  07/19/2016 4:24 PM  Type of Therapy:  Group Therapy  Participation Level:  Did Not Attend  Summary of Progress/Problems: Today's Topic: Overcoming Obstacles. Patients identified one short term goal and potential obstacles in reaching this goal. Patients processed barriers involved in overcoming these obstacles. Patients identified steps necessary for overcoming these obstacles and explored motivation (internal and external) for facing these difficulties head on.   Beola Vasallo N Smart LCSW 07/19/2016, 4:24 PM

## 2016-07-19 NOTE — Progress Notes (Signed)
NUTRITION ASSESSMENT  Pt identified as at risk on the Malnutrition Screen Tool  INTERVENTION: 1. Supplements: Continue Ensure Enlive po BID, each supplement provides 350 kcal and 20 grams of protein  NUTRITION DIAGNOSIS: Unintentional weight loss related to sub-optimal intake as evidenced by pt report.   Goal: Pt to meet >/= 90% of their estimated nutrition needs.  Monitor:  PO intake  Assessment:  Pt admitted with depression and ETOH abuse with SI. Pt reports using cocaine and marijuana daily per H&P with 2 pints daily of ETOH. Pt reports 30 lb of weight loss, chart review with conflicting findings regarding weight loss. On 10/27, pt weighed 131 lb and on 10/28 pt weighed 156 lb. Pt continues to have increased needs d/t substance abuse, will continue Ensure supplements. Pt is receiving Thiamine and Multivitamin with minerals daily as well.    Height: Ht Readings from Last 1 Encounters:  07/17/16 6' (1.829 m)    Weight: Wt Readings from Last 1 Encounters:  07/17/16 156 lb (70.8 kg)    Weight Hx: Wt Readings from Last 10 Encounters:  07/17/16 156 lb (70.8 kg)  07/16/16 131 lb 8 oz (59.6 kg)  06/10/16 160 lb (72.6 kg)  06/08/16 161 lb (73 kg)  05/13/14 175 lb (79.4 kg)  04/22/14 157 lb (71.2 kg)  12/14/13 152 lb (68.9 kg)  12/13/13 155 lb (70.3 kg)  11/12/13 170 lb (77.1 kg)    BMI:  Body mass index is 21.16 kg/m. Pt meets criteria for normal based on current BMI.  Estimated Nutritional Needs: Kcal: 25-30 kcal/kg Protein: > 1 gram protein/kg Fluid: 1 ml/kcal  Diet Order: Diet regular Room service appropriate? Yes; Fluid consistency: Thin Pt is also offered choice of unit snacks mid-morning and mid-afternoon.  Pt is eating as desired.   Lab results and medications reviewed.   Tilda FrancoLindsey Alizah Sills, MS, RD, LDN Pager: [phone] After Hours Pager: [phone]

## 2016-07-19 NOTE — Progress Notes (Signed)
D: Pt denies SI/HI/AVH at this time . Pt is pleasant and cooperative. Pt visible on the unit this evening, but pt stayed to himself mostly even around his peers.   A: Pt was offered support and encouragement. Pt was given scheduled medications. Pt was encourage to attend groups. Q 15 minute checks were done for safety.   R: Pt is taking medication. Pt has no complaints at this time .Pt receptive to treatment and safety maintained on unit.

## 2016-07-19 NOTE — Progress Notes (Signed)
Recreation Therapy Notes  Date: 07/19/16 Time: 0930 Location: 300 [address] Dayroom  Group Topic: Stress Management  Goal Area(s) Addresses:  Patient will verbalize importance of using healthy stress management.  Patient will identify positive emotions associated with healthy stress management.   Intervention: Calm App  Activity :  Body Scan Meditation.  LRT introduced the stress management technique of meditation.  LRT played a body scan meditation from the Calm app to allow the patients to engage in the activity.  Patients were to follow along as the meditation was being played.  Education:  Stress Management, Discharge Planning.   Education Outcome: Acknowledges edcuation/In group clarification offered/Needs additional education  Clinical Observations/Feedback: Pt did not attend group.    Caroll RancherMarjette Daisy Lites, LRT/CTRS         [name], Christalyn Goertz A 07/19/2016 12:15 PM

## 2016-07-19 NOTE — Progress Notes (Signed)
Patient was in room during time of assessment. Patient stated that his plan was to receive long-term behavior. Patient stated that he was having feelings of passive SI, but denies HI or AVH. Patient denied symptoms of withdrawal.   Patient remains safe with q 15 min monitoring. Patient offered support, encouragement, and education.   Patient is receptive and compliant, will continue to monitor.

## 2016-07-19 NOTE — Progress Notes (Addendum)
[facility]  07/19/2016 1:51 PM  Patient Active Problem List   Diagnosis Date Noted  . Major depressive disorder, recurrent (HCC) 07/17/2016  . Alcohol-induced mood disorder (HCC) 06/10/2016  . Cocaine dependence (HCC) 04/23/2014  . Substance induced mood disorder (HCC) 04/23/2014  . MDD (major depressive disorder) (HCC) 04/22/2014  . Alcohol use disorder, severe, dependence (HCC) 12/14/2013  . Adult ADHD 12/14/2013  . Polysubstance abuse 12/13/2013  . Alcohol withdrawal (HCC) 10/28/2013    Diagnosis: Alcohol use disorder, severe depression  Subjective: Patient admitted over the weekend for complaints of alcohol use disorder which then leads to using cocaine and a history of suicidal ideation and depression and anxiety. He was admitted on Remeron 7.5 mg and we discussed increasing it to 15 mg dose. Patient also reports being on Protonix and would like to resume for GERD. He reports that he is having some paresthesias in his right foot. He denies any pain but reports that it feels as if his foot is numb or tingling on occasion and he feels awkward when walking when it feels this way. Patient is able to ambulate without difficulty at this time.Patient reports was on naltrexone in the past and found it helpful and would like to resume.  Objective: Well-developed well-nourished male in no apparent distress pleasant and appropriate speech and motor within normal limits mood is described as "all right as "affect is mildly dysphoric thought processes linear and goal-directed thought content denies any acute suicidal or homicidal plan or intent insight and judgment are fair IQ appears an average range         Current Facility-Administered Medications (Analgesics):  .  acetaminophen (TYLENOL) tablet 650 mg     Current Facility-Administered Medications (Other):  .  alum & mag hydroxide-simeth (MAALOX/MYLANTA) 200-200-20 MG/5ML suspension 30 mL .  feeding supplement (ENSURE  ENLIVE) (ENSURE ENLIVE) liquid 237 mL .  hydrOXYzine (ATARAX/VISTARIL) tablet 25 mg .  loperamide (IMODIUM) capsule 2-4 mg .  LORazepam (ATIVAN) tablet 1 mg .  [COMPLETED] LORazepam (ATIVAN) tablet 1 mg **FOLLOWED BY** [COMPLETED] LORazepam (ATIVAN) tablet 1 mg **FOLLOWED BY** [START ON 07/20/2016] LORazepam (ATIVAN) tablet 1 mg **FOLLOWED BY** [START ON 07/21/2016] LORazepam (ATIVAN) tablet 1 mg .  magnesium hydroxide (MILK OF MAGNESIA) suspension 30 mL .  mirtazapine (REMERON) tablet 15 mg .  multivitamin with minerals tablet 1 tablet .  nicotine (NICODERM CQ - dosed in mg/24 hours) patch 21 mg .  ondansetron (ZOFRAN-ODT) disintegrating tablet 4 mg .  [START ON 07/20/2016] pantoprazole (PROTONIX) EC tablet 20 mg .  thiamine (B-1) injection 100 mg .  thiamine (VITAMIN B-1) tablet 100 mg  No current outpatient prescriptions on file.  Vital Signs:Blood pressure 121/73, pulse 74, temperature 97.7 F (36.5 C), temperature source Oral, resp. rate 18, height 6' (1.829 m), weight 70.8 kg (156 lb), SpO2 98 %.    Lab Results: No results found for this or any previous visit (from the past 48 hour(s)).  Physical Findings: AIMS: Facial and Oral Movements Muscles of Facial Expression: None, normal Lips and Perioral Area: None, normal Jaw: None, normal Tongue: None, normal,Extremity Movements Upper (arms, wrists, hands, fingers): None, normal Lower (legs, knees, ankles, toes): None, normal, Trunk Movements Neck, shoulders, hips: None, normal, Overall Severity Severity of abnormal movements (highest score from questions above): None, normal Incapacitation due to abnormal movements: None, normal Patient's awareness of abnormal movements (rate only patient's report): No Awareness, Dental Status Current problems with teeth and/or dentures?: No Does patient usually wear  dentures?: No  CIWA:  CIWA-Ar Total: 9 COWS:  COWS Total Score: 4   Assessment/Plan: Patient appears to be tolerating detox  well and will continue with taper. We'll increase Remeron to 15 mg by mouth daily at bedtime for anxiety and depression. Patient will meet with social worker to discuss options for further follow-up. We will check TSH, B12 and RPR secondary to patient's complaint of paresthesias. Other labs have been within within normal limits or unremarkable. 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 There appears to be no acute neurological issue at present. We will continue to monitor. Patient will be started on Protonix 20 mg by mouth daily for history of GERD and having been on Protonix prior to admission. We will order naltrexone 50 mg by mouth every morning for alcohol use disorder. [name], MD 07/19/2016, 1:51 PM

## 2016-07-19 NOTE — [facility] Suicide Risk Assessment (Signed)
[facility] INPATIENT:  Family/Significant Other Suicide Prevention Education  Suicide Prevention Education:  Patient Refusal for Family/Significant Other Suicide Prevention Education: The patient Daniel Arroyo has refused to provide written consent for family/significant other to be provided Family/Significant Other Suicide Prevention Education during admission and/or prior to discharge.  Physician notified.  SPE completed with pt, as pt refused to consent to family contact. SPI pamphlet provided to pt and pt was encouraged to share information with support network, ask questions, and talk about any concerns relating to SPE. Pt denies access to guns/firearms and verbalized understanding of information provided. Mobile Crisis information also provided to pt.   Shafer Swamy N Smart LCSW 07/19/2016, 12:47 PM

## 2016-07-19 NOTE — Tx Team (Signed)
Interdisciplinary Treatment and Diagnostic Plan Update  07/19/2016 Time of Session: 9:30AM Daniel Arroyo MRN: [id number]  Principal Diagnosis: Major depressive disorder, recurrent (HCC)  Secondary Diagnoses: Principal Problem:   Major depressive disorder, recurrent (HCC)   Current Medications:  Current Facility-Administered Medications  Medication Dose Route Frequency Provider Last Rate Last Dose  . acetaminophen (TYLENOL) tablet 650 mg  650 mg Oral Q6H PRN Jackelyn Poling, NP      . alum & mag hydroxide-simeth (MAALOX/MYLANTA) 200-200-20 MG/5ML suspension 30 mL  30 mL Oral Q4H PRN Jackelyn Poling, NP      . feeding supplement (ENSURE ENLIVE) (ENSURE ENLIVE) liquid 237 mL  237 mL Oral BID BM Neysa Hotter, MD   237 mL at 07/17/16 1530  . hydrOXYzine (ATARAX/VISTARIL) tablet 25 mg  25 mg Oral Q6H PRN Neysa Hotter, MD      . loperamide (IMODIUM) capsule 2-4 mg  2-4 mg Oral PRN Neysa Hotter, MD      . LORazepam (ATIVAN) tablet 1 mg  1 mg Oral Q6H PRN Neysa Hotter, MD   1 mg at 07/18/16 2235  . [START ON 07/20/2016] LORazepam (ATIVAN) tablet 1 mg  1 mg Oral BID Neysa Hotter, MD       Followed by  . [START ON 07/21/2016] LORazepam (ATIVAN) tablet 1 mg  1 mg Oral Daily Neysa Hotter, MD      . magnesium hydroxide (MILK OF MAGNESIA) suspension 30 mL  30 mL Oral Daily PRN Jackelyn Poling, NP      . mirtazapine (REMERON) tablet 7.5 mg  7.5 mg Oral QHS Neysa Hotter, MD   7.5 mg at 07/18/16 2124  . multivitamin with minerals tablet 1 tablet  1 tablet Oral Daily Neysa Hotter, MD   1 tablet at 07/19/16 0840  . nicotine (NICODERM CQ - dosed in mg/24 hours) patch 21 mg  21 mg Transdermal Q0600 Jackelyn Poling, NP   21 mg at 07/18/16 0949  . ondansetron (ZOFRAN-ODT) disintegrating tablet 4 mg  4 mg Oral Q6H PRN Neysa Hotter, MD      . thiamine (B-1) injection 100 mg  100 mg Intramuscular Once Neysa Hotter, MD      . thiamine (VITAMIN B-1) tablet 100 mg  100 mg Oral Daily Neysa Hotter, MD   100 mg at 07/19/16  0840   PTA Medications: Prescriptions Prior to Admission  Medication Sig Dispense Refill Last Dose  . calcium carbonate (TUMS - DOSED IN MG ELEMENTAL CALCIUM) 500 MG chewable tablet Chew 1-2 tablets by mouth daily as needed for indigestion or heartburn.   Past Week at Unknown time  . cloNIDine (CATAPRES) 0.1 MG tablet Take 1 tablet (0.1 mg total) by mouth 2 (two) times daily. For high blood pressure (Patient not taking: Reported on 07/16/2016) 60 tablet 11 Not Taking at Unknown time  . divalproex (DEPAKOTE ER) 250 MG 24 hr tablet Take 1 tablet (250 mg total) by mouth 3 (three) times daily. (Patient not taking: Reported on 07/16/2016) 42 tablet 0 Not Taking at Unknown time  . gabapentin (NEURONTIN) 100 MG capsule Take 1 capsule (100 mg total) by mouth 3 (three) times daily. (Patient not taking: Reported on 07/16/2016) 42 capsule 0 Not Taking at Unknown time  . hydrOXYzine (ATARAX/VISTARIL) 25 MG tablet Take 1 tablet (25 mg total) by mouth every 6 (six) hours as needed for anxiety. (Patient not taking: Reported on 07/16/2016) 30 tablet 0 Not Taking at Unknown time    Patient Stressors: Financial difficulties Legal  issue Occupational concerns Substance abuse  Patient Strengths: Capable of independent living Wellsite geologistCommunication skills General fund of knowledge  Treatment Modalities: Medication Management, Group therapy, Case management,  1 to 1 session with clinician, Psychoeducation, Recreational therapy.   Physician Treatment Plan for Primary Diagnosis: Major depressive disorder, recurrent (HCC) Long Term Goal(s): Improvement in symptoms so as ready for discharge Improvement in symptoms so as ready for discharge   Short Term Goals: Ability to verbalize feelings will improve Ability to disclose and discuss suicidal ideas Ability to demonstrate self-control will improve Ability to identify changes in lifestyle to reduce recurrence of condition will improve Ability to demonstrate self-control  will improve Ability to identify and develop effective coping behaviors will improve Ability to identify triggers associated with substance abuse/mental health issues will improve  Medication Management: Evaluate patient's response, side effects, and tolerance of medication regimen.  Therapeutic Interventions: 1 to 1 sessions, Unit Group sessions and Medication administration.  Evaluation of Outcomes: Progressing  Physician Treatment Plan for Secondary Diagnosis: Principal Problem:   Major depressive disorder, recurrent (HCC)  Long Term Goal(s): Improvement in symptoms so as ready for discharge Improvement in symptoms so as ready for discharge   Short Term Goals: Ability to verbalize feelings will improve Ability to disclose and discuss suicidal ideas Ability to demonstrate self-control will improve Ability to identify changes in lifestyle to reduce recurrence of condition will improve Ability to demonstrate self-control will improve Ability to identify and develop effective coping behaviors will improve Ability to identify triggers associated with substance abuse/mental health issues will improve     Medication Management: Evaluate patient's response, side effects, and tolerance of medication regimen.  Therapeutic Interventions: 1 to 1 sessions, Unit Group sessions and Medication administration.  Evaluation of Outcomes: Progressing   RN Treatment Plan for Primary Diagnosis: Major depressive disorder, recurrent (HCC) Long Term Goal(s): Knowledge of disease and therapeutic regimen to maintain health will improve  Short Term Goals: Ability to remain free from injury will improve, Ability to disclose and discuss suicidal ideas and Ability to identify and develop effective coping behaviors will improve  Medication Management: RN will administer medications as ordered by provider, will assess and evaluate patient's response and provide education to patient for prescribed medication. RN  will report any adverse and/or side effects to prescribing provider.  Therapeutic Interventions: 1 on 1 counseling sessions, Psychoeducation, Medication administration, Evaluate responses to treatment, Monitor vital signs and CBGs as ordered, Perform/monitor CIWA, COWS, AIMS and Fall Risk screenings as ordered, Perform wound care treatments as ordered.  Evaluation of Outcomes: Progressing   LCSW Treatment Plan for Primary Diagnosis: Major depressive disorder, recurrent (HCC) Long Term Goal(s): Safe transition to appropriate next level of care at discharge, Engage patient in therapeutic group addressing interpersonal concerns.  Short Term Goals: Engage patient in aftercare planning with referrals and resources, Facilitate patient progression through stages of change regarding substance use diagnoses and concerns and Identify triggers associated with mental health/substance abuse issues  Therapeutic Interventions: Assess for all discharge needs, 1 to 1 time with Social worker, Explore available resources and support systems, Assess for adequacy in community support network, Educate family and significant other(s) on suicide prevention, Complete Psychosocial Assessment, Interpersonal group therapy.  Evaluation of Outcomes: Progressing   Progress in Treatment: Attending groups: No. New to unit. Continuing to assess.  Participating in groups: No. Taking medication as prescribed: Yes. Toleration medication: Yes. Family/Significant other contact made: No, will contact:  family member if pt consents Patient understands diagnosis: Yes. Discussing patient  identified problems/goals with staff: Yes. Medical problems stabilized or resolved: Yes. Denies suicidal/homicidal ideation: Yes. Issues/concerns per patient self-inventory: No. Other: n/a   New problem(s) identified: No, Describe:  n/a  New Short Term/Long Term Goal(s):medication stabilization and referral for further inpatient treatment for  substance abuse.   Discharge Plan or Barriers: CSW assessing for appropriate referrals. Pt interested in [facility]--referral faxed 07/19/16 at 9:20AM.   Reason for Continuation of Hospitalization: Depression Medication stabilization Withdrawal symptoms  Estimated Length of Stay: 2-3 days   Attendees: Patient: 07/19/2016 9:21 AM  Physician: Dr. Mckinley Jewelates MD 07/19/2016 9:21 AM  Nursing: Holley DexterJoy, Beverly RN 07/19/2016 9:21 AM  RN Care Manager: Onnie BoerJennifer Clark CM 07/19/2016 9:21 AM  Social Worker: Trula SladeHeather Smart, LCSW 07/19/2016 9:21 AM  Recreational Therapist:  07/19/2016 9:21 AM  Other:  07/19/2016 9:21 AM  Other:  07/19/2016 9:21 AM  Other: 07/19/2016 9:21 AM    Scribe for Treatment Team: Ledell PeoplesHeather N Smart, LCSW 07/19/2016 9:21 AM

## 2016-07-20 LAB — VITAMIN B12: Vitamin B-12: 428 pg/mL (ref 180–914)

## 2016-07-20 LAB — RPR: RPR: NONREACTIVE

## 2016-07-20 LAB — TSH: TSH: 2.588 u[IU]/mL (ref 0.350–4.500)

## 2016-07-20 MED ORDER — GABAPENTIN 300 MG PO CAPS
300.0000 mg | ORAL_CAPSULE | Freq: Three times a day (TID) | ORAL | Status: DC
  Administered 2016-07-20: 300 mg via ORAL
  Filled 2016-07-20 (×6): qty 1

## 2016-07-20 MED ORDER — MIRTAZAPINE 15 MG PO TABS: 15.0000 mg | ORAL_TABLET | Freq: Every day | ORAL | 0 refills | Status: DC

## 2016-07-20 MED ORDER — PANTOPRAZOLE SODIUM 20 MG PO TBEC: 20.0000 mg | DELAYED_RELEASE_TABLET | Freq: Every day | ORAL | 0 refills | Status: DC

## 2016-07-20 MED ORDER — THIAMINE HCL 100 MG PO TABS: 100.0000 mg | ORAL_TABLET | Freq: Every day | ORAL | 0 refills | Status: DC

## 2016-07-20 MED ORDER — NICOTINE 21 MG/24HR TD PT24: 21.0000 mg | MEDICATED_PATCH | Freq: Every day | TRANSDERMAL | 0 refills | Status: DC

## 2016-07-20 MED ORDER — NALTREXONE HCL 50 MG PO TABS: 50.0000 mg | ORAL_TABLET | Freq: Every day | ORAL | 0 refills | Status: DC

## 2016-07-20 MED ORDER — HYDROXYZINE HCL 25 MG PO TABS: 25.0000 mg | ORAL_TABLET | Freq: Four times a day (QID) | ORAL | 0 refills | Status: DC | PRN

## 2016-07-20 MED ORDER — GABAPENTIN 300 MG PO CAPS: 300.0000 mg | ORAL_CAPSULE | Freq: Three times a day (TID) | ORAL | 0 refills | Status: DC

## 2016-07-20 NOTE — Progress Notes (Signed)
Patient discharged per physician order; patient denies SI/HI and A/V hallucinations; patient received prescriptions, samples, AVS, copy of the suicide safety plan, suicide risk assessment note, and transition record given to the patient after it was reviewed; patient had no other questions or concerns at this time; patient verbalized and signed that all belongings were returned; patient left the unit ambulatory 

## 2016-07-20 NOTE — [facility] Suicide Risk Assessment (Signed)
[facility]   Principal Problem: Major depressive disorder, recurrent [facility](HCC) Discharge Diagnoses:  Patient Active Problem List   Diagnosis Date Noted  . Major depressive disorder, recurrent (HCC) [F33.9] 07/17/2016  . Alcohol-induced mood disorder (HCC) [F10.94] 06/10/2016  . Cocaine dependence (HCC) [F14.20] 04/23/2014  . Substance induced mood disorder (HCC) [F19.94] 04/23/2014  . MDD (major depressive disorder) (HCC) [F32.9] 04/22/2014  . Alcohol use disorder, severe, dependence (HCC) [F10.20] 12/14/2013  . Adult ADHD [F90.9] 12/14/2013  . Polysubstance abuse [F19.10] 12/13/2013  . Alcohol withdrawal (HCC) [F10.239] 10/28/2013    Total Time spent with patient: 15 minutes  Musculoskeletal: Strength & Muscle Tone: within normal limits Gait & Station: normal Patient leans: N/A  Psychiatric Specialty Exam: ROS  Blood pressure (!) 136/96, pulse 78, temperature 97.9 F (36.6 C), temperature source Oral, resp. rate 18, height 6' (1.829 m), weight 70.8 kg (156 lb), SpO2 98 %.Body mass index is 21.16 kg/m.  General Appearance: Casual  Eye Contact::  Good  Speech:  Clear and Coherent409  Volume:  Normal  Mood:  Euthymic  Affect:  Congruent  Thought Process:  Coherent  Orientation:  Full (Time, Place, and Person)  Thought Content:  Negative and NA  Suicidal Thoughts:  No  Homicidal Thoughts:  No  Memory:  Negative  Judgement:  Fair  Insight:  Fair  Psychomotor Activity:  Normal  Concentration:  Good  Recall:  Good  Fund of Knowledge:Good  Language: Good  Akathisia:  No  Handed:  Right  AIMS (if indicated):     Assets:  Resilience  Sleep:  Number of Hours: 6  Cognition: WNL  ADL's:  Intact   Mental Status Per Nursing Assessment::   On Admission:  Suicidal ideation indicated by patient  Demographic Factors:  Male, Caucasian and Unemployed  Loss Factors: Decrease in vocational status  Historical Factors: NA  Risk Reduction Factors:    NA  Continued Clinical Symptoms:  Alcohol/Substance Abuse/Dependencies  Cognitive Features That Contribute To Risk:  None    Suicide Risk:  Mild:  Suicidal ideation of limited frequency, intensity, duration, and specificity.  There are no identifiable plans, no associated intent, mild dysphoria and related symptoms, good self-control (both objective and subjective assessment), few other risk factors, and identifiable protective factors, including available and accessible social support.  Follow-up Information    [facility] Follow up on 07/29/2016.   Why:  Screening for admission on this date at 7:45AM. Please bring photo ID/proof of [place] residence. Erin from admissions asks that you bring letter from attorney stating that court date will be continued--otherwise, you must go to court first.  Contact information: [place] [address] [place] [address] [phone]        [facility]   Specialty:  Behavioral Health Why:  Walk in betweeen 8am-9am Monday through Friday for hospital follow-up/medication management, and assessment for counseling services.  Contact information: [address] [place] [address] [phone]           Plan Of Care/Follow-up recommendations:  Other:  Patient denies current suicidal or homicidal ideation, plan or intent. He reports that he has to take care of some important business and has requested to be discharged. He is encouraged to continue to follow up with long-term substance use treatment.  Acquanetta SitElizabeth Woods Jace Fermin, MD 07/20/2016, 1:25 PM

## 2016-07-20 NOTE — Progress Notes (Signed)
  [facility] :  Will you be returning to the same living situation after discharge:  Yes,  home At discharge, do you have transportation home?: Yes,  car in parking lot Do you have the ability to pay for your medications: Yes,  mental health  Release of information consent forms completed and submitted to medical records by CSW.  Patient to Follow up at: Follow-up Information    [facility] Follow up on 07/29/2016.   Why:  Screening for admission on this date at 7:45AM. Please bring photo ID/proof of [place] residence. Erin from admissions asks that you bring letter from attorney stating that court date will be continued--otherwise, you must go to court first.  Contact information: [address] [place] [address] [phone]        [facility]   Specialty:  Behavioral Health Why:  Walk in betweeen 8am-9am Monday through Friday for hospital follow-up/medication management, and assessment for counseling services.  Contact information: [address] [place] [address] [phone]           Next level of care provider has [facility] to [facility] Link:no  Safety Planning and Suicide Prevention discussed: Yes,  SPE completed with pt; pt declined to consent to family contact.  Have you used any form of tobacco in the last 30 days? (Cigarettes, Smokeless Tobacco, Cigars, and/or Pipes): Yes  Has patient been referred to the Quitline?: Patient refused referral  Patient has been referred for addiction treatment: Yes  Charlot Gouin N Smart LCSW 07/20/2016, 1:41 PM

## 2016-07-20 NOTE — Progress Notes (Signed)
Recreation Therapy Notes  Animal-Assisted Activity (AAA) Program Checklist/Progress Notes Patient Eligibility Criteria Checklist & Daily Group note for Rec TxIntervention  Date: 10.31.2017 Time: 2:45pm [facility] [address] [facility] [address] [facility]    AAA/T Program Assumption of Risk Form signed by Patient/ or Parent Legal Guardian Yes  Patient is free of allergies or sever asthma Yes  Patient reports no fear of animals Yes  Patient reports no history of cruelty to animals Yes  Patient understands his/her participation is voluntary Yes  Behavioral Response: Did not attend. Patient declined AAA services during admission.   Marykay Lexenise L Twilla Khouri, LRT/CTRS         Aika Brzoska L 07/20/2016 2:57 PM

## 2016-07-20 NOTE — Discharge Summary (Signed)
Physician Discharge Summary Note  Patient:  Jatavius Ellenwood is an 45 y.o., male MRN:  [id number] DOB:  October 03, 1970 Patient phone:  [phone] (home)  Patient address:   [address] [place] [address],  Total Time spent with patient: 45 minutes  Date of Admission:  07/17/2016 Date of Discharge: 07/20/16  Reason for Admission:   Jye Hardisonis an 45 y.o.male.  -Clinician reviewed note by Everlene Farrier, PA. Carmon Sahli is a 45 y.o. Male with a history of mental illness who presents to the emergency department complaining of suicidal ideations today. He reports he has a plan to shoot himself. He reports he has access to guns at home. He reports a long history of depression and alcohol abuse. He reports is been getting gradually worse over the past several months. He reports recently losing his job and this has contributed to his symptoms. He has not been taking his psychiatric medications. He reports drinking alcohol daily. He denies history complex withdrawal from alcohol. He denies physical complaints. He denies homicidal ideations. He denies visual or auditory hallucinations. He denies attempts to harm himself tonight. He denies illicit substance abuse.  Patient endorses thoughts of killing himself by using a gun. He reports having access to guns. When asked if he had guns in home, he hesitates and says he can get them. Patient told nursing staff he had rifles, shotguns in the home. Patient reports waking up in the morning feeling like he "is on the ceiling." Panic attacks in morning which leads to him drinking to make the panic abate. He then will use cocaine and marijuana. Feels guilty for drug use and wants to kill himself.  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 He reports upswing in use of drugs and ETOH since then. Stressors include finances and job loss. He is unsure of how stable his housing is going to be. Pt says  he has no real family supports. He had two brothers who are deceased, ETOH &drugs contributed to their deaths. Patient reports weight loss of 30 lbs in the last two months.  Patient is using ETOH daily, in the last few months, drinking two pints per day. Has symptoms of tremors, nausea, vomiting. No seizures. Patient has a DUI charge and has court in November. Patient uses cocaine and marijuana regularly.   Patient says he cannot afford medications that he has been prescribed in the past as he has no health insurance. Patient has no outpatient services. He was at [facility] in [place] in September '17; on [facility] [address] in August &March 2015.   On Evaluation:Lige Tufo is awake, alert and oriented *4. Seen resting in the bedroom  Denies suicidal or homicidal ideation during this assessement. Denies auditory or visual hallucination and does not appear to be responding to internal stimuli. Patient validates inform,ation provided above. Support, encouragement and reassurance was provided.   Principal Problem: Major depressive disorder, recurrent [facility]) Discharge Diagnoses: Patient Active Problem List   Diagnosis Date Noted  . Major depressive disorder, recurrent (HCC) [F33.9] 07/17/2016    Priority: High  . Alcohol-induced mood disorder (HCC) [F10.94] 06/10/2016  . Cocaine dependence (HCC) [F14.20] 04/23/2014  . Substance induced mood disorder (HCC) [F19.94] 04/23/2014  . MDD (major depressive disorder) (HCC) [F32.9] 04/22/2014  . Alcohol use disorder, severe, dependence (HCC) [F10.20] 12/14/2013  . Adult ADHD [F90.9] 12/14/2013  . Polysubstance abuse [F19.10] 12/13/2013  . Alcohol withdrawal (HCC) [F10.239] 10/28/2013    Past Psychiatric History:  See H&P  Past Medical History:  Past Medical History:  Diagnosis Date  . ADHD, adult residual type   . Anxiety   . Anxiety and depression   . Bipolar disorder (HCC)   . Depression   . Gastroesophageal reflux disease   . Knee pain   .  Polysubstance abuse     Past Surgical History:  Procedure Laterality Date  . TONSILLECTOMY     Family History: History reviewed. No pertinent family history. Family Psychiatric  History: See H&P Social History:  History  Alcohol Use  . Yes    Comment: daily 2 pints of liquor per day     History  Drug Use  . Types: Cocaine, Marijuana, IV, Heroin    Comment: heroin    Social History   Social History  . Marital status: Divorced    Spouse name: N/A  . Number of children: N/A  . Years of education: N/A   Social History Main Topics  . Smoking status: Current Every Day Smoker    Packs/day: 1.00  . Smokeless tobacco: Never Used  . Alcohol use Yes     Comment: daily 2 pints of liquor per day  . Drug use:     Types: Cocaine, Marijuana, IV, Heroin     Comment: heroin  . Sexual activity: Yes   Other Topics Concern  . None   Social History Narrative  . None    Hospital Course:   Gaspar BiddingRonnie Troublefield was admitted for Major depressive disorder, recurrent (HCC), and crisis management.  Pt was treated discharged with the medications listed below under Medication List.  Medical problems were identified and treated as needed.  Home medications were restarted as appropriate.  Improvement was monitored by observation and Gaspar Biddingonnie Goodie 's daily report of symptom reduction.  Emotional and mental status was monitored by daily self-inventory reports completed by [facility] and clinical staff.         Beulah Ucci was evaluated by the treatment team for stability and plans for continued recovery upon discharge. Chance Croston 's motivation was an integral factor for scheduling further treatment. Employment, transportation, bed availability, health status, family support, and any pending legal issues were also considered during hospital stay. Pt was offered further treatment options upon discharge including but not limited to Residential, Intensive Outpatient, and Outpatient treatment.   Gilford Watton will follow up with the services as listed below under Follow Up Information.     Upon completion of this admission the patient was both mentally and medically stable for discharge denying suicidal/homicidal ideation, auditory/visual/tactile hallucinations, delusional thoughts and paranoia.    Marinus Sauber responded well to treatment with neurontin, vistaril, remeron, naltrexone, nicotine, protonix, and thiamine without adverse effects. Pt demonstrated improvement without reported or observed adverse effects to the point of stability appropriate for outpatient management. Pertinent labs include:  UDS+ cocaine, THC, BAL 202, AST 47 for which outpatient follow-up is necessary for lab recheck as mentioned below. Reviewed CBC, CMP, BAL, and UDS; all unremarkable aside from noted exceptions.   Physical Findings: AIMS: Facial and Oral Movements Muscles of Facial Expression: None, normal Lips and Perioral Area: None, normal Jaw: None, normal Tongue: None, normal,Extremity Movements Upper (arms, wrists, hands, fingers): None, normal Lower (legs, knees, ankles, toes): None, normal, Trunk Movements Neck, shoulders, hips: None, normal, Overall Severity Severity of abnormal movements (highest score from questions above): None, normal Incapacitation due to abnormal movements: None, normal Patient's awareness of abnormal movements (rate only patient's report): No Awareness, Dental  Status Current problems with teeth and/or dentures?: No Does patient usually wear dentures?: No  CIWA:  CIWA-Ar Total: 0 COWS:  COWS Total Score: 4  Musculoskeletal: Strength & Muscle Tone: within normal limits Gait & Station: normal Patient leans: N/A  Psychiatric Specialty Exam: Physical Exam  Review of Systems  Psychiatric/Behavioral: Positive for depression and substance abuse. Negative for hallucinations and suicidal ideas. The patient is nervous/anxious and has insomnia.   All other systems  reviewed and are negative.   Blood pressure (!) 136/96, pulse 78, temperature 97.9 F (36.6 C), temperature source Oral, resp. rate 18, height 6' (1.829 m), weight 70.8 kg (156 lb), SpO2 98 %.Body mass index is 21.16 kg/m.  SEE MD PSE WITHIN THE SRA  Have you used any form of tobacco in the last 30 days? (Cigarettes, Smokeless Tobacco, Cigars, and/or Pipes): Yes  Has this patient used any form of tobacco in the last 30 days? (Cigarettes, Smokeless Tobacco, Cigars, and/or Pipes) Yes, Yes, A prescription for an FDA-approved tobacco cessation medication was offered at discharge and the patient refused  Blood Alcohol level:  Lab Results  Component Value Date   ETH 202 (H) 07/17/2016   ETH 169 (H) 06/09/2016    Metabolic Disorder Labs:  Lab Results  Component Value Date   HGBA1C 4.9 12/14/2013   MPG 94 12/14/2013   No results found for: PROLACTIN No results found for: CHOL, TRIG, HDL, CHOLHDL, VLDL, LDLCALC  See Psychiatric Specialty Exam and Suicide Risk Assessment completed by Attending Physician prior to discharge.  Discharge destination:  Home  Is patient on multiple antipsychotic therapies at discharge:  No   Has Patient had three or more failed trials of antipsychotic monotherapy by history:  No  Recommended Plan for Multiple Antipsychotic Therapies: NA     Medication List    STOP taking these medications   calcium carbonate 500 MG chewable tablet Commonly known as:  TUMS - dosed in mg elemental calcium   cloNIDine 0.1 MG tablet Commonly known as:  CATAPRES   divalproex 250 MG 24 hr tablet Commonly known as:  DEPAKOTE ER     TAKE these medications     Indication  gabapentin 300 MG capsule Commonly known as:  NEURONTIN Take 1 capsule (300 mg total) by mouth 3 (three) times daily. What changed:  medication strength  how much to take  Indication:  mood stabilization   hydrOXYzine 25 MG tablet Commonly known as:  ATARAX/VISTARIL Take 1 tablet (25 mg  total) by mouth every 6 (six) hours as needed for anxiety.  Indication:  Anxiety Neurosis   mirtazapine 15 MG tablet Commonly known as:  REMERON Take 1 tablet (15 mg total) by mouth at bedtime.  Indication:  Trouble Sleeping   naltrexone 50 MG tablet Commonly known as:  DEPADE Take 1 tablet (50 mg total) by mouth daily. Start taking on:  07/21/2016  Indication:  Excessive Use of Alcohol   nicotine 21 mg/24hr patch Commonly known as:  NICODERM CQ - dosed in mg/24 hours Place 1 patch (21 mg total) onto the skin daily at 6 (six) AM. Start taking on:  07/21/2016  Indication:  Nicotine Addiction   pantoprazole 20 MG tablet Commonly known as:  PROTONIX Take 1 tablet (20 mg total) by mouth daily. Start taking on:  07/21/2016  Indication:  Gastroesophageal Reflux Disease   thiamine 100 MG tablet Take 1 tablet (100 mg total) by mouth daily. Start taking on:  07/21/2016  Indication:  Deficiency in Thiamine or  Vitamin B1      Follow-up Information    [facility] Follow up on 07/29/2016.   Why:  Screening for admission on this date at 7:45AM. Please bring photo ID/proof of [place] residence. Erin from admissions asks that you bring letter from attorney stating that court date will be continued--otherwise, you must go to court first.  Contact information: Ephriam Jenkins[address] [place] [address] [phone]        [facility]   Specialty:  Behavioral Health Why:  Walk in betweeen 8am-9am Monday through Friday for hospital follow-up/medication management, and assessment for counseling services.  Contact informationElpidio Eric: [address] [place] [address] [phone]           Follow-up recommendations:  Activity:  As tolerated Diet:  Heart healthy with low sodium.  Comments:   Take all medications as prescribed. Keep all follow-up appointments as scheduled.  Do not consume alcohol or use illegal drugs while on prescription medications. Report any  adverse effects from your medications to your primary care provider promptly.  In the event of recurrent symptoms or worsening symptoms, call 911, a crisis hotline, or go to the nearest emergency department for evaluation.   Signed: Beau FannyWithrow, Tracie Dore C, FNP 07/20/2016, 1:57 PM

## 2016-07-20 NOTE — Tx Team (Signed)
Interdisciplinary Treatment and Diagnostic Plan Update  07/20/2016 Time of Session: 9:30AM Daniel Arroyo MRN: [id number]  Principal Diagnosis: Major depressive disorder, recurrent ([place])  Secondary Diagnoses: Principal Problem:   Major depressive disorder, recurrent ([place])   Current Medications:  Current Facility-Administered Medications  Medication Dose Route Frequency Provider Last Rate Last Dose  . acetaminophen (TYLENOL) tablet 650 mg  650 mg Oral Q6H PRN Rozetta Nunnery, NP      . alum & mag hydroxide-simeth (MAALOX/MYLANTA) 200-200-20 MG/5ML suspension 30 mL  30 mL Oral Q4H PRN Rozetta Nunnery, NP      . feeding supplement (ENSURE ENLIVE) (ENSURE ENLIVE) liquid 237 mL  237 mL Oral BID BM Norman Clay, MD   237 mL at 07/20/16 1000  . gabapentin (NEURONTIN) capsule 300 mg  300 mg Oral TID Linard Millers, MD   300 mg at 07/20/16 1208  . hydrOXYzine (ATARAX/VISTARIL) tablet 25 mg  25 mg Oral Q6H PRN Norman Clay, MD      . loperamide (IMODIUM) capsule 2-4 mg  2-4 mg Oral PRN Norman Clay, MD      . LORazepam (ATIVAN) tablet 1 mg  1 mg Oral Q6H PRN Norman Clay, MD   1 mg at 07/18/16 2235  . LORazepam (ATIVAN) tablet 1 mg  1 mg Oral BID Norman Clay, MD   1 mg at 07/20/16 0820   Followed by  . [START ON 07/21/2016] LORazepam (ATIVAN) tablet 1 mg  1 mg Oral Daily Norman Clay, MD      . magnesium hydroxide (MILK OF MAGNESIA) suspension 30 mL  30 mL Oral Daily PRN Rozetta Nunnery, NP      . mirtazapine (REMERON) tablet 15 mg  15 mg Oral QHS Linard Millers, MD   15 mg at 07/19/16 2112  . multivitamin with minerals tablet 1 tablet  1 tablet Oral Daily Norman Clay, MD   1 tablet at 07/20/16 0820  . naltrexone (DEPADE) tablet 50 mg  50 mg Oral Daily Linard Millers, MD   50 mg at 07/20/16 0820  . nicotine (NICODERM CQ - dosed in mg/24 hours) patch 21 mg  21 mg Transdermal Q0600 Rozetta Nunnery, NP   21 mg at 07/20/16 0821  . ondansetron (ZOFRAN-ODT) disintegrating tablet 4 mg  4  mg Oral Q6H PRN Norman Clay, MD      . pantoprazole (PROTONIX) EC tablet 20 mg  20 mg Oral Daily Linard Millers, MD   20 mg at 07/20/16 0820  . thiamine (B-1) injection 100 mg  100 mg Intramuscular Once Norman Clay, MD      . thiamine (VITAMIN B-1) tablet 100 mg  100 mg Oral Daily Norman Clay, MD   100 mg at 07/20/16 0820   PTA Medications: Prescriptions Prior to Admission  Medication Sig Dispense Refill Last Dose  . calcium carbonate (TUMS - DOSED IN MG ELEMENTAL CALCIUM) 500 MG chewable tablet Chew 1-2 tablets by mouth daily as needed for indigestion or heartburn.   Past Week at Unknown time  . cloNIDine (CATAPRES) 0.1 MG tablet Take 1 tablet (0.1 mg total) by mouth 2 (two) times daily. For high blood pressure (Patient not taking: Reported on 07/16/2016) 60 tablet 11 Not Taking at Unknown time  . divalproex (DEPAKOTE ER) 250 MG 24 hr tablet Take 1 tablet (250 mg total) by mouth 3 (three) times daily. (Patient not taking: Reported on 07/16/2016) 42 tablet 0 Not Taking at Unknown time  . gabapentin (NEURONTIN) 100 MG capsule  Take 1 capsule (100 mg total) by mouth 3 (three) times daily. (Patient not taking: Reported on 07/16/2016) 42 capsule 0 Not Taking at Unknown time  . hydrOXYzine (ATARAX/VISTARIL) 25 MG tablet Take 1 tablet (25 mg total) by mouth every 6 (six) hours as needed for anxiety. (Patient not taking: Reported on 07/16/2016) 30 tablet 0 Not Taking at Unknown time    Patient Stressors: Financial difficulties Legal issue Occupational concerns Substance abuse  Patient Strengths: Capable of independent living Curator fund of knowledge  Treatment Modalities: Medication Management, Group therapy, Case management,  1 to 1 session with clinician, Psychoeducation, Recreational therapy.   Physician Treatment Plan for Primary Diagnosis: Major depressive disorder, recurrent ([place]) Long Term Goal(s): Improvement in symptoms so as ready for  discharge Improvement in symptoms so as ready for discharge   Short Term Goals: Ability to verbalize feelings will improve Ability to disclose and discuss suicidal ideas Ability to demonstrate self-control will improve Ability to identify changes in lifestyle to reduce recurrence of condition will improve Ability to demonstrate self-control will improve Ability to identify and develop effective coping behaviors will improve Ability to identify triggers associated with substance abuse/mental health issues will improve  Medication Management: Evaluate patient's response, side effects, and tolerance of medication regimen.  Therapeutic Interventions: 1 to 1 sessions, Unit Group sessions and Medication administration.  Evaluation of Outcomes: Met  Physician Treatment Plan for Secondary Diagnosis: Principal Problem:   Major depressive disorder, recurrent ([place])  Long Term Goal(s): Improvement in symptoms so as ready for discharge Improvement in symptoms so as ready for discharge   Short Term Goals: Ability to verbalize feelings will improve Ability to disclose and discuss suicidal ideas Ability to demonstrate self-control will improve Ability to identify changes in lifestyle to reduce recurrence of condition will improve Ability to demonstrate self-control will improve Ability to identify and develop effective coping behaviors will improve Ability to identify triggers associated with substance abuse/mental health issues will improve     Medication Management: Evaluate patient's response, side effects, and tolerance of medication regimen.  Therapeutic Interventions: 1 to 1 sessions, Unit Group sessions and Medication administration.  Evaluation of Outcomes: Met   RN Treatment Plan for Primary Diagnosis: Major depressive disorder, recurrent ([place]) Long Term Goal(s): Knowledge of disease and therapeutic regimen to maintain health will improve  Short Term Goals: Ability to remain free  from injury will improve, Ability to disclose and discuss suicidal ideas and Ability to identify and develop effective coping behaviors will improve  Medication Management: RN will administer medications as ordered by provider, will assess and evaluate patient's response and provide education to patient for prescribed medication. RN will report any adverse and/or side effects to prescribing provider.  Therapeutic Interventions: 1 on 1 counseling sessions, Psychoeducation, Medication administration, Evaluate responses to treatment, Monitor vital signs and CBGs as ordered, Perform/monitor CIWA, COWS, AIMS and Fall Risk screenings as ordered, Perform wound care treatments as ordered.  Evaluation of Outcomes: Met   LCSW Treatment Plan for Primary Diagnosis: Major depressive disorder, recurrent ([place]) Long Term Goal(s): Safe transition to appropriate next level of care at discharge, Engage patient in therapeutic group addressing interpersonal concerns.  Short Term Goals: Engage patient in aftercare planning with referrals and resources, Facilitate patient progression through stages of change regarding substance use diagnoses and concerns and Identify triggers associated with mental health/substance abuse issues  Therapeutic Interventions: Assess for all discharge needs, 1 to 1 time with Social worker, Explore available resources and support systems, Assess  for adequacy in community support network, Educate family and significant other(s) on suicide prevention, Complete Psychosocial Assessment, Interpersonal group therapy.  Evaluation of Outcomes: Met   Progress in Treatment: Attending groups: Yes  Participating in groups: Yes Taking medication as prescribed: Yes. Toleration medication: Yes. Family/Significant other contact made: SPE completed with pt; pt declined to consent to family contact.  Patient understands diagnosis: Yes. Discussing patient identified problems/goals with staff:  Yes. Medical problems stabilized or resolved: Yes. Denies suicidal/homicidal ideation: Yes. Issues/concerns per patient self-inventory: No. Other: n/a   New problem(s) identified: No, Describe:  n/a  New Short Term/Long Term Goal(s):medication stabilization and referral for further inpatient treatment for substance abuse.   Discharge Plan or Barriers: Pt is choosing to discharge today and will return home; he has app[facility] at [facility] for [facility] on Nov 9th and plans to follow-up with [facility] on an outpatient basis. 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   Reason for Continuation of Hospitalization: none  Estimated Length of Stay: d/c today   Attendees: Patient: 07/20/2016 1:42 PM  Physician: Dr. Sharolyn Douglas MD 07/20/2016 1:42 PM  Nursing: Windy Fast RN 07/20/2016 1:42 PM  RN Care Manager: Lars Pinks CM 07/20/2016 1:42 PM  Social Worker: Press photographer, LCSW 07/20/2016 1:42 PM  Recreational Therapist:  07/20/2016 1:42 PM  Other:  07/20/2016 1:42 PM  Other:  07/20/2016 1:42 PM  Other: 07/20/2016 1:42 PM    Scribe for Treatment Team: Kimber Relic Smart, LCSW 07/20/2016 1:42 PM

## 2016-09-08 ENCOUNTER — Encounter (HOSPITAL_BASED_OUTPATIENT_CLINIC_OR_DEPARTMENT_OTHER): Payer: Self-pay

## 2016-09-08 ENCOUNTER — Emergency Department (HOSPITAL_BASED_OUTPATIENT_CLINIC_OR_DEPARTMENT_OTHER)
Admission: EM | Admit: 2016-09-08 | Discharge: 2016-09-08 | Disposition: A | Discharge status: Home / Self Care | Payer: Self-pay | Attending: Physician Assistant | Admitting: Physician Assistant

## 2016-09-08 DIAGNOSIS — Z79899 Other long term (current) drug therapy: Secondary | ICD-10-CM | POA: Insufficient documentation

## 2016-09-08 DIAGNOSIS — Z87891 Personal history of nicotine dependence: Secondary | ICD-10-CM | POA: Insufficient documentation

## 2016-09-08 DIAGNOSIS — J0100 Acute maxillary sinusitis, unspecified: Secondary | ICD-10-CM | POA: Insufficient documentation

## 2016-09-08 MED ORDER — FLUTICASONE PROPIONATE 50 MCG/ACT NA SUSP: 2.0000 | Freq: Every day | NASAL | 0 refills | Status: DC

## 2016-09-08 MED ORDER — CETIRIZINE HCL 10 MG PO TABS: 10.0000 mg | ORAL_TABLET | Freq: Every day | ORAL | 0 refills | Status: DC

## 2016-09-08 NOTE — ED Triage Notes (Signed)
C/o "sinus congestion", HA, right ear pain-NAD-steady gait

## 2016-09-08 NOTE — Discharge Instructions (Signed)
Your symptoms are likely caused by viral sinusitis. Please start taking Zyrtec daily. Please also start Flonase daily. Return to the emergency department for fever, chills, visual changes, or persistent symptoms that last over a week.

## 2016-09-08 NOTE — ED Provider Notes (Signed)
[facility] Provider Note   CSN: [id number] Arrival date & time: 09/08/16  2158  By signing my name below, I, Daniel Arroyo, attest that this documentation has been prepared under the direction and in the presence Cheri FowlerKayla Tiawanna Luchsinger, PA-C. Electronically Signed: Linna Darnerussell Arroyo, Scribe. 09/08/2016. 11:04 PM.  History   Chief Complaint Chief Complaint  Patient presents with  . Nasal Congestion    The history is provided by the patient. No language interpreter was used.     HPI Comments: Daniel Arroyo is a 45 y.o. male who presents to the Emergency Department complaining of sudden onset, constant, nasal congestion beginning yesterday. He reports associated sinus pressure, rhinorrhea, right ear pain, sore throat, and a throbbing frontal headache. He states his mucous is green/yellow. He has tried Benadryl with no relief of his symptoms.  [facility] He notes he has some contacts with similar symptoms. He denies fever, chills, visual changes, numbness, weakness, or any other associated symptoms.  Past Medical History:  Diagnosis Date  . ADHD, adult residual type   . Anxiety   . Anxiety and depression   . Bipolar disorder (HCC)   . Depression   . Gastroesophageal reflux disease   . Knee pain   . Polysubstance abuse     Patient Active Problem List   Diagnosis Date Noted  . Major depressive disorder, recurrent (HCC) 07/17/2016  . Alcohol-induced mood disorder (HCC) 06/10/2016  . Cocaine dependence (HCC) 04/23/2014  . Substance induced mood disorder (HCC) 04/23/2014  . MDD (major depressive disorder) (HCC) 04/22/2014  . Alcohol use disorder, severe, dependence (HCC) 12/14/2013  . Adult ADHD 12/14/2013  . Polysubstance abuse 12/13/2013  . Alcohol withdrawal (HCC) 10/28/2013    Past Surgical History:  Procedure Laterality Date  . TONSILLECTOMY         Home Medications    Prior to Admission medications   Medication Sig Start Date End Date Taking?  Authorizing Provider  cetirizine (ZYRTEC) 10 MG tablet Take 1 tablet (10 mg total) by mouth daily. 09/08/16   Caydin Yeatts, PA-C  fluticasone (FLONASE) 50 MCG/ACT nasal spray Place 2 sprays into both nostrils daily. 09/08/16   Cheri FowlerKayla Kimia Finan, PA-C  gabapentin (NEURONTIN) 300 MG capsule Take 1 capsule (300 mg total) by mouth 3 (three) times daily. 07/20/16   Beau FannyJohn C Withrow, FNP  hydrOXYzine (ATARAX/VISTARIL) 25 MG tablet Take 1 tablet (25 mg total) by mouth every 6 (six) hours as needed for anxiety. 07/20/16   Beau FannyJohn C Withrow, FNP  mirtazapine (REMERON) 15 MG tablet Take 1 tablet (15 mg total) by mouth at bedtime. 07/20/16   Beau FannyJohn C Withrow, FNP  naltrexone (DEPADE) 50 MG tablet Take 1 tablet (50 mg total) by mouth daily. 07/21/16   Beau FannyJohn C Withrow, FNP  nicotine (NICODERM CQ - DOSED IN MG/24 HOURS) 21 mg/24hr patch Place 1 patch (21 mg total) onto the skin daily at 6 (six) AM. 07/21/16   Beau FannyJohn C Withrow, FNP  pantoprazole (PROTONIX) 20 MG tablet Take 1 tablet (20 mg total) by mouth daily. 07/21/16   Beau FannyJohn C Withrow, FNP  thiamine 100 MG tablet Take 1 tablet (100 mg total) by mouth daily. 07/21/16   Beau FannyJohn C Withrow, FNP    Family History No family history on file.  Social History Social History  Substance Use Topics  . Smoking status: Former Smoker    Packs/day: 1.00  . Smokeless tobacco: Never Used  . Alcohol use No     Comment: in rehab  Allergies   Patient has no known allergies.   Review of Systems Review of Systems  Constitutional: Negative for chills and fever.  HENT: Positive for congestion, ear pain (right), rhinorrhea, sinus pressure and sore throat.   Eyes: Negative for visual disturbance.  Neurological: Positive for headaches.  All other systems reviewed and are negative.    Physical Exam Updated Vital Signs BP (!) 149/106 (BP Location: Right Arm)   Pulse 82   Temp 98 F (36.7 C) (Oral)   Resp 22   Ht 6' (1.829 m)   Wt 81.5 kg   SpO2 100%   BMI 24.36 kg/m   Physical  Exam  Constitutional: He is oriented to person, place, and time. He appears well-developed and well-nourished. He is active.  Non-toxic appearance. He does not have a sickly appearance. He does not appear ill.  HENT:  Head: Normocephalic and atraumatic.  Right Ear: Tympanic membrane and external ear normal. Tympanic membrane is not erythematous and not bulging.  Left Ear: Tympanic membrane and external ear normal. Tympanic membrane is not erythematous and not bulging.  Nose: Mucosal edema present. Right sinus exhibits maxillary sinus tenderness. Right sinus exhibits no frontal sinus tenderness. Left sinus exhibits maxillary sinus tenderness. Left sinus exhibits no frontal sinus tenderness.  Mouth/Throat: Uvula is midline, oropharynx is clear and moist and mucous membranes are normal. No trismus in the jaw. No uvula swelling. No oropharyngeal exudate, posterior oropharyngeal edema, posterior oropharyngeal erythema or tonsillar abscesses.  Neck: Normal range of motion. Neck supple.  No nuchal rigidity.   Cardiovascular: Normal rate and regular rhythm.   Pulmonary/Chest: Effort normal and breath sounds normal. No respiratory distress. He has no wheezes. He has no rales.  Abdominal: Soft. Bowel sounds are normal. He exhibits no distension. There is no tenderness.  Musculoskeletal: Normal range of motion.  Lymphadenopathy:    He has no cervical adenopathy.  Neurological: He is alert and oriented to person, place, and time.  Skin: Skin is warm and dry.  Psychiatric: He has a normal mood and affect. His behavior is normal.     ED Treatments / Results  Labs (all labs ordered are listed, but only abnormal results are displayed) Labs Reviewed - No data to display  EKG  EKG Interpretation None       Radiology No results found.  Procedures Procedures (including critical care time)  DIAGNOSTIC STUDIES: Oxygen Saturation is 100% on RA, normal by my interpretation.    COORDINATION OF  CARE: 11:08 PM Discussed treatment plan with pt at bedside and pt agreed to plan.  Medications Ordered in ED Medications - No data to display   Initial Impression / Assessment and Plan / ED Course  I have reviewed the triage vital signs and the nursing notes.  Pertinent labs & imaging results that were available during my care of the patient were reviewed by me and considered in my medical decision making (see chart for details).  Clinical Course    Patient complaining of symptoms of sinusitis.    Mild to moderate symptoms of clear/yellow nasal discharge/congestion and scratchy throat with cough for less than 10 days.  Patient is afebrile.  No concern for acute bacterial rhinosinusitis; likely viral in nature.  Consider CVST, but no neurologic complaints or deficits.  Patient discharged with symptomatic treatment.  Patient instructions given for warm saline nasal washes.  Recommendations for follow-up with primary care physician.     Final Clinical Impressions(s) / ED Diagnoses   Final diagnoses:  Acute non-recurrent maxillary sinusitis    New Prescriptions Discharge Medication List as of 09/08/2016 11:49 PM    START taking these medications   Details  cetirizine (ZYRTEC) 10 MG tablet Take 1 tablet (10 mg total) by mouth daily., Starting Wed 09/08/2016, Print    fluticasone (FLONASE) 50 MCG/ACT nasal spray Place 2 sprays into both nostrils daily., Starting Wed 09/08/2016, Print       I personally performed the services described in this documentation, which was scribed in my presence. The recorded information has been reviewed and is accurate.    Cheri Fowler, PA-C 09/09/16 0059    Courteney Randall An, MD 09/09/16 2216

## 2016-09-08 NOTE — ED Notes (Signed)
C/o runny nose, congestion  Rt ear pain onset yesterday

## 2016-09-20 DIAGNOSIS — Z8673 Personal history of transient ischemic attack (TIA), and cerebral infarction without residual deficits: Secondary | ICD-10-CM

## 2016-09-20 HISTORY — DX: Personal history of transient ischemic attack (TIA), and cerebral infarction without residual deficits: Z86.73

## 2017-02-10 ENCOUNTER — Encounter (HOSPITAL_COMMUNITY): Payer: Self-pay | Admitting: *Deleted

## 2017-02-10 ENCOUNTER — Emergency Department (HOSPITAL_COMMUNITY)
Admission: EM | Admit: 2017-02-10 | Discharge: 2017-02-11 | Disposition: A | Discharge status: Other Acute Inpatient Hospital | Payer: Self-pay

## 2017-02-10 DIAGNOSIS — R4589 Other symptoms and signs involving emotional state: Secondary | ICD-10-CM

## 2017-02-10 DIAGNOSIS — F1410 Cocaine abuse, uncomplicated: Secondary | ICD-10-CM | POA: Insufficient documentation

## 2017-02-10 DIAGNOSIS — R45851 Suicidal ideations: Secondary | ICD-10-CM

## 2017-02-10 DIAGNOSIS — F909 Attention-deficit hyperactivity disorder, unspecified type: Secondary | ICD-10-CM | POA: Insufficient documentation

## 2017-02-10 DIAGNOSIS — F10239 Alcohol dependence with withdrawal, unspecified: Secondary | ICD-10-CM | POA: Diagnosis present

## 2017-02-10 DIAGNOSIS — Z87891 Personal history of nicotine dependence: Secondary | ICD-10-CM | POA: Insufficient documentation

## 2017-02-10 DIAGNOSIS — F1420 Cocaine dependence, uncomplicated: Secondary | ICD-10-CM | POA: Diagnosis present

## 2017-02-10 DIAGNOSIS — F332 Major depressive disorder, recurrent severe without psychotic features: Secondary | ICD-10-CM | POA: Insufficient documentation

## 2017-02-10 DIAGNOSIS — Z79899 Other long term (current) drug therapy: Secondary | ICD-10-CM | POA: Insufficient documentation

## 2017-02-10 DIAGNOSIS — F10939 Alcohol use, unspecified with withdrawal, unspecified: Secondary | ICD-10-CM | POA: Diagnosis present

## 2017-02-10 DIAGNOSIS — F341 Dysthymic disorder: Secondary | ICD-10-CM | POA: Insufficient documentation

## 2017-02-10 DIAGNOSIS — F1320 Sedative, hypnotic or anxiolytic dependence, uncomplicated: Secondary | ICD-10-CM | POA: Insufficient documentation

## 2017-02-10 DIAGNOSIS — F1910 Other psychoactive substance abuse, uncomplicated: Secondary | ICD-10-CM

## 2017-02-10 LAB — COMPREHENSIVE METABOLIC PANEL
ALK PHOS: 63 U/L (ref 38–126)
ALT: 47 U/L (ref 17–63)
ANION GAP: 9 (ref 5–15)
AST: 48 U/L — ABNORMAL HIGH (ref 15–41)
Albumin: 3.5 g/dL (ref 3.5–5.0)
BILIRUBIN TOTAL: 0.2 mg/dL — AB (ref 0.3–1.2)
BUN: <5 mg/dL — ABNORMAL LOW (ref 6–20)
CALCIUM: 8.6 mg/dL — AB (ref 8.9–10.3)
CO2: 22 mmol/L (ref 22–32)
Chloride: 111 mmol/L (ref 101–111)
Creatinine, Ser: 0.75 mg/dL (ref 0.61–1.24)
Glucose, Bld: 88 mg/dL (ref 65–99)
Potassium: 4 mmol/L (ref 3.5–5.1)
Sodium: 142 mmol/L (ref 135–145)
TOTAL PROTEIN: 6.7 g/dL (ref 6.5–8.1)

## 2017-02-10 LAB — ACETAMINOPHEN LEVEL

## 2017-02-10 LAB — CBC
HEMATOCRIT: 40.9 % (ref 39.0–52.0)
Hemoglobin: 14.3 g/dL (ref 13.0–17.0)
MCH: 33.9 pg (ref 26.0–34.0)
MCHC: 35 g/dL (ref 30.0–36.0)
MCV: 96.9 fL (ref 78.0–100.0)
Platelets: 268 10*3/uL (ref 150–400)
RBC: 4.22 MIL/uL (ref 4.22–5.81)
RDW: 14.2 % (ref 11.5–15.5)
WBC: 7.4 10*3/uL (ref 4.0–10.5)

## 2017-02-10 LAB — RAPID URINE DRUG SCREEN, HOSP PERFORMED
Amphetamines: NOT DETECTED
BARBITURATES: NOT DETECTED
Benzodiazepines: POSITIVE — AB
COCAINE: POSITIVE — AB
OPIATES: NOT DETECTED
Tetrahydrocannabinol: NOT DETECTED

## 2017-02-10 LAB — SALICYLATE LEVEL

## 2017-02-10 LAB — ETHANOL: Alcohol, Ethyl (B): 161 mg/dL — ABNORMAL HIGH (ref <5)

## 2017-02-10 MED ORDER — LORAZEPAM 1 MG PO TABS
0.0000 mg | ORAL_TABLET | Freq: Four times a day (QID) | ORAL | Status: DC
  Administered 2017-02-10: 2 mg via ORAL
  Administered 2017-02-10: 1 mg via ORAL
  Filled 2017-02-10: qty 2
  Filled 2017-02-10: qty 1

## 2017-02-10 MED ORDER — LORAZEPAM 1 MG PO TABS
0.0000 mg | ORAL_TABLET | Freq: Two times a day (BID) | ORAL | Status: DC
Start: 2017-02-12 — End: 2017-02-10

## 2017-02-10 MED ORDER — VITAMIN B-1 100 MG PO TABS: 100.0000 mg | ORAL_TABLET | Freq: Every day | ORAL | Status: DC

## 2017-02-10 MED ORDER — LORAZEPAM 2 MG/ML IJ SOLN: 1.0000 mg | Freq: Four times a day (QID) | INTRAMUSCULAR | Status: DC | PRN

## 2017-02-10 MED ORDER — NAPROXEN 500 MG PO TABS
500.0000 mg | ORAL_TABLET | Freq: Two times a day (BID) | ORAL | Status: DC | PRN
Start: 2017-02-10 — End: 2017-02-11

## 2017-02-10 MED ORDER — METHOCARBAMOL 500 MG PO TABS
500.0000 mg | ORAL_TABLET | Freq: Three times a day (TID) | ORAL | Status: DC | PRN
  Administered 2017-02-10: 500 mg via ORAL
  Filled 2017-02-10: qty 1

## 2017-02-10 MED ORDER — CLONIDINE HCL 0.1 MG PO TABS
0.1000 mg | ORAL_TABLET | Freq: Four times a day (QID) | ORAL | Status: DC
  Administered 2017-02-10 (×3): 0.1 mg via ORAL
  Filled 2017-02-10 (×3): qty 1

## 2017-02-10 MED ORDER — THIAMINE HCL 100 MG/ML IJ SOLN: 100.0000 mg | Freq: Every day | INTRAMUSCULAR | Status: DC

## 2017-02-10 MED ORDER — NICOTINE 21 MG/24HR TD PT24
21.0000 mg | MEDICATED_PATCH | Freq: Once | TRANSDERMAL | Status: DC
  Administered 2017-02-10: 21 mg via TRANSDERMAL
  Filled 2017-02-10: qty 1

## 2017-02-10 MED ORDER — HYDROXYZINE HCL 25 MG PO TABS: 25.0000 mg | ORAL_TABLET | Freq: Four times a day (QID) | ORAL | Status: DC | PRN

## 2017-02-10 MED ORDER — ADULT MULTIVITAMIN W/MINERALS CH
1.0000 | ORAL_TABLET | Freq: Every day | ORAL | Status: DC
  Administered 2017-02-10: 1 via ORAL
  Filled 2017-02-10: qty 1

## 2017-02-10 MED ORDER — LORAZEPAM 2 MG/ML IJ SOLN: 0.0000 mg | Freq: Four times a day (QID) | INTRAMUSCULAR | Status: DC

## 2017-02-10 MED ORDER — LORAZEPAM 1 MG PO TABS: 0.0000 mg | ORAL_TABLET | Freq: Four times a day (QID) | ORAL | Status: DC

## 2017-02-10 MED ORDER — CLONIDINE HCL 0.1 MG PO TABS: 0.1000 mg | ORAL_TABLET | ORAL | Status: DC

## 2017-02-10 MED ORDER — ONDANSETRON 4 MG PO TBDP: 4.0000 mg | ORAL_TABLET | Freq: Four times a day (QID) | ORAL | Status: DC | PRN

## 2017-02-10 MED ORDER — CLONIDINE HCL 0.1 MG PO TABS: 0.1000 mg | ORAL_TABLET | Freq: Every day | ORAL | Status: DC

## 2017-02-10 MED ORDER — LORAZEPAM 2 MG/ML IJ SOLN: 0.0000 mg | Freq: Two times a day (BID) | INTRAMUSCULAR | Status: DC

## 2017-02-10 MED ORDER — LOPERAMIDE HCL 2 MG PO CAPS: 2.0000 mg | ORAL_CAPSULE | ORAL | Status: DC | PRN

## 2017-02-10 MED ORDER — LORAZEPAM 1 MG PO TABS: 0.0000 mg | ORAL_TABLET | Freq: Two times a day (BID) | ORAL | Status: DC

## 2017-02-10 MED ORDER — LORAZEPAM 1 MG PO TABS: 1.0000 mg | ORAL_TABLET | Freq: Four times a day (QID) | ORAL | Status: DC | PRN

## 2017-02-10 MED ORDER — VITAMIN B-1 100 MG PO TABS
100.0000 mg | ORAL_TABLET | Freq: Every day | ORAL | Status: DC
  Administered 2017-02-10: 100 mg via ORAL
  Filled 2017-02-10: qty 1

## 2017-02-10 MED ORDER — DICYCLOMINE HCL 20 MG PO TABS
20.0000 mg | ORAL_TABLET | Freq: Four times a day (QID) | ORAL | Status: DC | PRN
  Administered 2017-02-10: 20 mg via ORAL
  Filled 2017-02-10: qty 1

## 2017-02-10 MED ORDER — FOLIC ACID 1 MG PO TABS
1.0000 mg | ORAL_TABLET | Freq: Every day | ORAL | Status: DC
  Administered 2017-02-10: 1 mg via ORAL
  Filled 2017-02-10: qty 1

## 2017-02-10 NOTE — [facility] Assessment (Signed)
[facility] Assessment Progress Note  Per Thedore MinsMojeed Akintayo, MD, this pt requires psychiatric hospitalization at this time.  Daniel Heinrichina Tate, RN, [id number]; she will call when the bed is available.  Pt has signed Voluntary Admission and Consent for Treatment, as well as Consent to Release Information to no one, and signed forms have [facility].  Pt's nurse has been notified, and agrees to send original paperwork along with pt via [place], and to call report to [phone].  Daniel Canninghomas Mayrin Schmuck, MA Triage Specialist [phone]

## 2017-02-10 NOTE — ED Triage Notes (Signed)
Patient is alert and oriented x4.  He states that he was dropped off to the ED by a friend because he was tired of waking up and being sick from alcohol and drug craving and states that he wants to die.  Currently he denies any physical pain.

## 2017-02-10 NOTE — ED Notes (Signed)
TTS counselor at bedside. 

## 2017-02-10 NOTE — ED Notes (Signed)
Introduced self to patient. Pt oriented to unit expectations.  Assessed pt for:  A) Anxiety &/or agitation: Pt has reported having withdrawal symptoms and discomfort. He stayed in bed all day, calm and cooperative.  S) Safety: Safety maintained with q-15-minute checks and hourly rounds by staff.  A) ADLs: Pt able to perform ADLs independently.  P) Pick-Up (room cleanliness): Pt's room clean and free of clutter.

## 2017-02-10 NOTE — ED Provider Notes (Signed)
[facility]-EMER[facility]ENCY DEPT Provider Note   CSN: [id number] Arrival date & time: 02/10/17  0320     History   Chief Complaint Chief Complaint  Patient presents with  . Suicidal    HPI Daniel Arroyo is a 46 y.o. male.  HPI   Daniel Arroyo is a 46 y.o. male, with a history of POLYSUBSTANCE abuse, anxiety, depression, and bipolar, presenting to the ED with substance abuse, dysphoria, and suicidal ideations. States, "I'm tired of waking up feeling like this. I just don't want to live anymore if this is what I have to deal with." Patient states he is referencing his addiction to alcohol, Heroin, and cocaine. He uses these substances daily. Drinks at least 2 pints of liquor a day. States he has not been compliant with any of his medications. Patient adds, "I have the means and the motivation to kill myself." Endorses that he has access to firearms. Denies HI. Denies A/V hallucinations. Denies physical complaints.       Past Medical History:  Diagnosis Date  . ADHD, adult residual type   . Anxiety   . Anxiety and depression   . Bipolar disorder (HCC)   . Depression   . Gastroesophageal reflux disease   . Knee pain   . Polysubstance abuse     Patient Active Problem List   Diagnosis Date Noted  . Major depressive disorder, recurrent (HCC) 07/17/2016  . Alcohol-induced mood disorder (HCC) 06/10/2016  . Cocaine dependence (HCC) 04/23/2014  . Substance induced mood disorder (HCC) 04/23/2014  . MDD (major depressive disorder) (HCC) 04/22/2014  . Alcohol use disorder, severe, dependence (HCC) 12/14/2013  . Adult ADHD 12/14/2013  . Polysubstance abuse 12/13/2013  . Alcohol withdrawal (HCC) 10/28/2013    Past Surgical History:  Procedure Laterality Date  . TONSILLECTOMY         Home Medications    Prior to Admission medications   Medication Sig Start Date End Date Taking? Authorizing Provider  cetirizine (ZYRTEC) 10 MG tablet Take 1 tablet (10 mg total) by mouth  daily. Patient not taking: Reported on 02/10/2017 09/08/16   Cheri Fowler, PA-C  fluticasone [facility]) 50 MCG/ACT nasal spray Place 2 sprays into both nostrils daily. Patient not taking: Reported on 02/10/2017 09/08/16   Cheri Fowler, PA-C  gabapentin (NEURONTIN) 300 MG capsule Take 1 capsule (300 mg total) by mouth 3 (three) times daily. Patient not taking: Reported on 02/10/2017 07/20/16   Beau Fanny, FNP  hydrOXYzine (ATARAX/VISTARIL) 25 MG tablet Take 1 tablet (25 mg total) by mouth every 6 (six) hours as needed for anxiety. Patient not taking: Reported on 02/10/2017 07/20/16   Beau Fanny, FNP  mirtazapine (REMERON) 15 MG tablet Take 1 tablet (15 mg total) by mouth at bedtime. Patient not taking: Reported on 02/10/2017 07/20/16   Beau Fanny, FNP  naltrexone (DEPADE) 50 MG tablet Take 1 tablet (50 mg total) by mouth daily. Patient not taking: Reported on 02/10/2017 07/21/16   Withrow, Everardo All, FNP  nicotine (NICODERM CQ - DOSED IN MG/24 HOURS) 21 mg/24hr patch Place 1 patch (21 mg total) onto the skin daily at 6 (six) AM. Patient not taking: Reported on 02/10/2017 07/21/16   Withrow, Everardo All, FNP  pantoprazole (PROTONIX) 20 MG tablet Take 1 tablet (20 mg total) by mouth daily. Patient not taking: Reported on 02/10/2017 07/21/16   Beau Fanny, FNP  thiamine 100 MG tablet Take 1 tablet (100 mg total) by mouth daily. Patient not taking: Reported on 02/10/2017 07/21/16  Withrow, Everardo AllJohn C, FNP    Family History History reviewed. No pertinent family history.  Social History Social History  Substance Use Topics  . Smoking status: Former Smoker    Packs/day: 1.00  . Smokeless tobacco: Never Used  . Alcohol use Yes     Comment: in rehab      Allergies   Patient has no known allergies.   Review of Systems Review of Systems  Respiratory: Negative for shortness of breath.   Cardiovascular: Negative for chest pain.  Gastrointestinal: Negative for abdominal pain, nausea and vomiting.   Psychiatric/Behavioral: Positive for dysphoric mood and suicidal ideas. Negative for hallucinations.  All other systems reviewed and are negative.    Physical Exam Updated Vital Signs BP 119/68 (BP Location: Right Arm)   Pulse 75   Temp 98.2 F (36.8 C) (Oral)   Resp 18   Ht 6' (1.829 m)   Wt 79.4 kg (175 lb)   SpO2 92%   BMI 23.73 kg/m   Physical Exam  Constitutional: He appears well-developed and well-nourished. No distress.  Patient does not appear to be clinically intoxicated.  HENT:  Head: Normocephalic and atraumatic.  Eyes: Conjunctivae are normal.  Neck: Neck supple.  Cardiovascular: Normal rate, regular rhythm, normal heart sounds and intact distal pulses.   Pulmonary/Chest: Effort normal and breath sounds normal. No respiratory distress.  Abdominal: Soft. There is no tenderness. There is no guarding.  Musculoskeletal: He exhibits no edema.  Lymphadenopathy:    He has no cervical adenopathy.  Neurological: He is alert.  Skin: Skin is warm and dry. He is not diaphoretic.  Psychiatric: His affect is labile. He expresses suicidal ideation.  Tearful during interview  Nursing note and vitals reviewed.    ED Treatments / Results  Labs (all labs ordered are listed, but only abnormal results are displayed) Labs Reviewed  COMPREHENSIVE METABOLIC PANEL - Abnormal; Notable for the following:       Result Value   BUN <5 (*)    Calcium 8.6 (*)    AST 48 (*)    Total Bilirubin 0.2 (*)    All other components within normal limits  ETHANOL - Abnormal; Notable for the following:    Alcohol, Ethyl (B) 161 (*)    All other components within normal limits  ACETAMINOPHEN LEVEL - Abnormal; Notable for the following:    Acetaminophen (Tylenol), Serum <10 (*)    All other components within normal limits  RAPID URINE DRUG SCREEN, HOSP PERFORMED - Abnormal; Notable for the following:    Cocaine POSITIVE (*)    Benzodiazepines POSITIVE (*)    All other components within  normal limits  SALICYLATE LEVEL  CBC    EKG  EKG Interpretation None       Radiology No results found.  Procedures Procedures (including critical care time)  Medications Ordered in ED Medications  nicotine (NICODERM CQ - dosed in mg/24 hours) patch 21 mg (not administered)  LORazepam (ATIVAN) injection 0-4 mg (not administered)    Or  LORazepam (ATIVAN) tablet 0-4 mg (not administered)  LORazepam (ATIVAN) injection 0-4 mg (not administered)    Or  LORazepam (ATIVAN) tablet 0-4 mg (not administered)  thiamine (VITAMIN B-1) tablet 100 mg (not administered)    Or  thiamine (B-1) injection 100 mg (not administered)     Initial Impression / Assessment and Plan / ED Course  I have reviewed the triage vital signs and the nursing notes.  Pertinent labs & imaging results that were available  during my care of the patient were reviewed by me and considered in my medical decision making (see chart for details).      Patient presents with complaints of substance abuse, dysphoric mood, and suicidal ideations. TTS consult ordered. Patient states he is no longer taking any of his prescribed medications, therefore, patient's medications will have to be reviewed and renewed by psychiatric service. Patient otherwise medically cleared.       Final Clinical Impressions(s) / ED Diagnoses   Final diagnoses:  Suicidal ideations  Polysubstance abuse  Dysphoric mood    New Prescriptions New Prescriptions   No medications on file     Concepcion Living 02/10/17 1610    Azalia Bilis, MD 02/10/17 [id number]

## 2017-02-10 NOTE — BH Assessment (Signed)
Tele Assessment Note   Daniel Arroyo is an 46 y.o. male brought in my friends due to concern about his wellness. Pt sts he is suicidal and the intent is to relieve himself of pain and disappointments.  Pt sts he owns weapons and sts he would use firearms to kill himself. Pr denies HI and does not endorse AVH.  Pt admits to abusing Heroin, Cocaine, and alcohol.  Pt denies having a psychiatrist and a therapist.  Pt sts he lives alone.  Pt sts he can return to his dwelling. Pt expressed he has stressors of not having a job and abuse of alcohol and drugs.  Pt sts he does not have an legal issues or upcoming court dates.  Pt presents in hospital scrubs with an unremarkable appearance. Pt had poor eye contact and appeared agitated in his responses.  Pt had freedom of movement. Pt was sleeping and not alert during the session.  Questions had to be repeated often.  Pt reports his mood as anxious.  Pt affect was flat and irritable.  Pt judgement appears impaired and his insight is poor. Pt is 4 x's oriented.  Pt meets criteria for and is recommended for inpatient treatment services per Donell SievertSpencer Simon, NP.  Diagnosis: Opioid use Disorder, Alcohol Use Disorder, and Cocaine Use Disorder, Major Depressive Disorder.  Past Medical History:  Past Medical History:  Diagnosis Date  . ADHD, adult residual type   . Anxiety   . Anxiety and depression   . Bipolar disorder (HCC)   . Depression   . Gastroesophageal reflux disease   . Knee pain   . Polysubstance abuse     Past Surgical History:  Procedure Laterality Date  . TONSILLECTOMY      Family History: History reviewed. No pertinent family history.  Social History:  reports that he has quit smoking. He smoked 1.00 pack per day. He has never used smokeless tobacco. He reports that he drinks alcohol. He reports that he uses drugs, including Heroin and Marijuana.  Additional Social History:  Alcohol / Drug Use Pain Medications: See  MAR Prescriptions: See MAR Over the Counter: See MAR History of alcohol / drug use?: Yes Longest period of sobriety (when/how long): couple of weeks 3 months ago Negative Consequences of Use: Financial, Armed forces operational officerLegal, Personal relationships, Work / School Substance #1 Name of Substance 1: Heroin 1 - Age of First Use: 18  1 - Amount (size/oz): 1 gram 1 - Frequency: Daily  1 - Duration: 25  1 - Last Use / Amount: 02/10/17 Substance #2 Name of Substance 2: Alcohol 2 - Age of First Use: 14 2 - Amount (size/oz): 2 pts  2 - Frequency: daily 2 - Duration: 02/09/2014  CIWA: CIWA-Ar BP: 119/68 Pulse Rate: 75 COWS:    PATIENT STRENGTHS: (choose at least two) Average or above average intelligence Capable of independent living General fund of knowledge Special hobby/interest Supportive family/friends  Allergies: No Known Allergies  Home Medications:  (Not in a hospital admission)  OB/GYN Status:  No LMP for male patient.  General Assessment Data [facility] of Assessment: [facility] TTS Assessment: In system Is this a Tele or Face-to-Face Assessment?: Face-to-Face Is this an Initial Assessment or a Re-assessment for this encounter?: Initial Assessment Marital status: Divorced Living Arrangements: Alone Can pt return to current living arrangement?: Yes Admission Status: Voluntary Is patient capable of signing voluntary admission?: Yes Referral Source: Self/Family/Friend Insurance type: self pay     Crisis Care Plan Living Arrangements: Alone Legal Guardian:  Other: (Self) Name of Psychiatrist: none reported Name of Therapist: None reported  Education Status Is patient currently in school?: No Current Grade: n Highest grade of school patient has completed: Master's degree  Risk to self with the past 6 months Suicidal Ideation: Yes-Currently Present Has patient been a risk to self within the past 6 months prior to admission? : Yes Suicidal Intent: Yes-Currently Present Has  patient had any suicidal intent within the past 6 months prior to admission? : Yes Is patient at risk for suicide?: Yes Suicidal Plan?: Yes-Currently Present Has patient had any suicidal plan within the past 6 months prior to admission? : Yes Specify Current Suicidal Plan: Fire arms Access to Means: Yes Specify Access to Suicidal Means: Pt sts he will shoot himself What has been your use of drugs/alcohol within the last 12 months?: yes Previous Attempts/Gestures: No How many times?: 0 Other Self Harm Risks: No Triggers for Past Attempts: None known Intentional Self Injurious Behavior: None Family Suicide History: Yes Recent stressful life event(s): Other (Comment), Loss (Comment) (Drugs & Alochol) Persecutory voices/beliefs?: No Depression: Yes Depression Symptoms: Fatigue, Guilt, Loss of interest in usual pleasures, Feeling angry/irritable Substance abuse history and/or treatment for substance abuse?: Yes Suicide prevention information given to non-admitted patients: Not applicable  Risk to Others within the past 6 months Homicidal Ideation: No Does patient have any lifetime risk of violence toward others beyond the six months prior to admission? : No Thoughts of Harm to Others: No Current Homicidal Intent: No Current Homicidal Plan: No Access to Homicidal Means: No Identified Victim: NA History of harm to others?: No Assessment of Violence: None Noted Violent Behavior Description: No Does patient have access to weapons?: Yes (Comment) Criminal Charges Pending?: No Does patient have a court date: No Is patient on probation?: No  Psychosis Hallucinations: None noted Delusions: None noted  Mental Status Report Appearance/Hygiene: In scrubs, Unremarkable Eye Contact: Poor Motor Activity: Unremarkable Speech: Logical/coherent Level of Consciousness: Sleeping Mood: Anxious Affect: Depressed, Flat Anxiety Level: Moderate Thought Processes: Coherent Judgement:  Impaired Orientation: Person, Place, Time, Situation  Cognitive Functioning Concentration: Normal Memory: Recent Intact, Remote Intact IQ: Average Insight: Poor Impulse Control: Poor Appetite: Good Weight Loss: 20 Sleep:  (Pt sts both) Total Hours of Sleep: 2 Vegetative Symptoms: None  ADLScreening [facility] Assessment Services) Patient's cognitive ability adequate to safely complete daily activities?: Yes Patient able to express need for assistance with ADLs?: Yes Independently performs ADLs?: Yes (appropriate for developmental age)  Prior Inpatient Therapy Prior Inpatient Therapy: No Prior Therapy Dates: 1 year ago Prior Therapy Facilty/Provider(s): [facility] Reason for Treatment: [facility]  Prior Outpatient Therapy Prior Outpatient Therapy:  ([facility]) Prior Therapy Dates: [facility] Prior Therapy Facilty/Provider(s): [facility] Reason for Treatment: [facility] Does patient have an ACCT team?: No Does patient have Intensive In-House Services?  : No Does patient have [facility] services? : No Does patient have P4CC services?: No  ADL Screening (condition at time of admission) Patient's cognitive ability adequate to safely complete daily activities?: Yes Patient able to express need for assistance with ADLs?: Yes Independently performs ADLs?: Yes (appropriate for developmental age)             Merchant navy officer (For Healthcare) Does Patient Have a Medical Advance Directive?: No Would patient like information on creating a medical advance directive?: No - Patient declined    Additional Information 1:1 In Past 12 Months?: No CIRT Risk: No Elopement Risk: No Does patient have medical clearance?: Yes     Disposition:  Disposition Initial Assessment Completed for this Encounter: Yes Disposition of Patient: Inpatient treatment program (Per Donell Sievert, NP) Type of inpatient treatment program: Adult  [facility] 02/10/2017 5:46 AM

## 2017-02-10 NOTE — ED Notes (Signed)
Bed: WA31 Expected date:  Expected time:  Means of arrival:  Comments: 

## 2017-02-10 NOTE — ED Notes (Signed)
Patient placed his cell phone, wallet and cell phone charging cord in his bag with his belongings himself.  Patient belongings placed in locker 31.  Patient blood and urine collected.  Patient resting in bed with eyes closed and notable chest rise and fall.

## 2017-02-10 NOTE — ED Notes (Signed)
Patient continues to endorse SI and denies HI and AVH at this time. Plan of care discussed with patient. Encouragement and support provided and safety maintain. Q 15 min safety checks remain place and video monitoring.

## 2017-02-11 ENCOUNTER — Encounter (HOSPITAL_COMMUNITY): Payer: Self-pay | Admitting: *Deleted

## 2017-02-11 ENCOUNTER — Inpatient Hospital Stay (HOSPITAL_COMMUNITY)
Admission: AD | Admit: 2017-02-11 | Discharge: 2017-02-22 | DRG: 897 | Disposition: A | Discharge status: Home / Self Care | Payer: Federal, State, Local not specified - Other | Source: Intra-hospital | Attending: Psychiatry | Admitting: Psychiatry

## 2017-02-11 DIAGNOSIS — J309 Allergic rhinitis, unspecified: Secondary | ICD-10-CM | POA: Diagnosis present

## 2017-02-11 DIAGNOSIS — R45851 Suicidal ideations: Secondary | ICD-10-CM | POA: Diagnosis present

## 2017-02-11 DIAGNOSIS — F909 Attention-deficit hyperactivity disorder, unspecified type: Secondary | ICD-10-CM | POA: Diagnosis present

## 2017-02-11 DIAGNOSIS — F332 Major depressive disorder, recurrent severe without psychotic features: Secondary | ICD-10-CM | POA: Diagnosis present

## 2017-02-11 DIAGNOSIS — M545 Low back pain: Secondary | ICD-10-CM | POA: Diagnosis not present

## 2017-02-11 DIAGNOSIS — F1990 Other psychoactive substance use, unspecified, uncomplicated: Secondary | ICD-10-CM

## 2017-02-11 DIAGNOSIS — F1290 Cannabis use, unspecified, uncomplicated: Secondary | ICD-10-CM | POA: Diagnosis present

## 2017-02-11 DIAGNOSIS — G4700 Insomnia, unspecified: Secondary | ICD-10-CM | POA: Diagnosis present

## 2017-02-11 DIAGNOSIS — F1994 Other psychoactive substance use, unspecified with psychoactive substance-induced mood disorder: Secondary | ICD-10-CM | POA: Diagnosis present

## 2017-02-11 DIAGNOSIS — Z87891 Personal history of nicotine dependence: Secondary | ICD-10-CM

## 2017-02-11 DIAGNOSIS — F064 Anxiety disorder due to known physiological condition: Secondary | ICD-10-CM | POA: Diagnosis present

## 2017-02-11 DIAGNOSIS — F1024 Alcohol dependence with alcohol-induced mood disorder: Principal | ICD-10-CM | POA: Diagnosis present

## 2017-02-11 DIAGNOSIS — R252 Cramp and spasm: Secondary | ICD-10-CM | POA: Diagnosis not present

## 2017-02-11 DIAGNOSIS — F10239 Alcohol dependence with withdrawal, unspecified: Secondary | ICD-10-CM | POA: Diagnosis present

## 2017-02-11 DIAGNOSIS — K219 Gastro-esophageal reflux disease without esophagitis: Secondary | ICD-10-CM | POA: Diagnosis present

## 2017-02-11 DIAGNOSIS — Z590 Homelessness: Secondary | ICD-10-CM

## 2017-02-11 DIAGNOSIS — F1110 Opioid abuse, uncomplicated: Secondary | ICD-10-CM | POA: Diagnosis present

## 2017-02-11 DIAGNOSIS — F1420 Cocaine dependence, uncomplicated: Secondary | ICD-10-CM | POA: Diagnosis present

## 2017-02-11 MED ORDER — NAPROXEN 500 MG PO TABS
500.0000 mg | ORAL_TABLET | Freq: Two times a day (BID) | ORAL | Status: AC | PRN
  Administered 2017-02-11 (×2): 500 mg via ORAL
  Filled 2017-02-11 (×2): qty 1

## 2017-02-11 MED ORDER — LORAZEPAM 1 MG PO TABS
1.0000 mg | ORAL_TABLET | Freq: Three times a day (TID) | ORAL | Status: AC
  Administered 2017-02-12 (×3): 1 mg via ORAL
  Filled 2017-02-11 (×3): qty 1

## 2017-02-11 MED ORDER — ADULT MULTIVITAMIN W/MINERALS CH
1.0000 | ORAL_TABLET | Freq: Every day | ORAL | Status: DC
  Administered 2017-02-11: 1 via ORAL
  Filled 2017-02-11 (×3): qty 1

## 2017-02-11 MED ORDER — LORAZEPAM 1 MG PO TABS
1.0000 mg | ORAL_TABLET | Freq: Every day | ORAL | Status: AC
  Administered 2017-02-14: 1 mg via ORAL
  Filled 2017-02-11 (×2): qty 1

## 2017-02-11 MED ORDER — HYDROXYZINE HCL 25 MG PO TABS: 25.0000 mg | ORAL_TABLET | Freq: Four times a day (QID) | ORAL | Status: DC | PRN

## 2017-02-11 MED ORDER — MAGNESIUM HYDROXIDE 400 MG/5ML PO SUSP: 30.0000 mL | Freq: Every day | ORAL | Status: DC | PRN

## 2017-02-11 MED ORDER — ACETAMINOPHEN 325 MG PO TABS
650.0000 mg | ORAL_TABLET | Freq: Four times a day (QID) | ORAL | Status: DC | PRN
  Administered 2017-02-16 – 2017-02-21 (×2): 650 mg via ORAL
  Filled 2017-02-11 (×2): qty 2

## 2017-02-11 MED ORDER — LORAZEPAM 1 MG PO TABS: 1.0000 mg | ORAL_TABLET | Freq: Four times a day (QID) | ORAL | Status: AC | PRN

## 2017-02-11 MED ORDER — VITAMIN B-1 100 MG PO TABS: 100.0000 mg | ORAL_TABLET | Freq: Every day | ORAL | Status: DC

## 2017-02-11 MED ORDER — THIAMINE HCL 100 MG/ML IJ SOLN: 100.0000 mg | Freq: Every day | INTRAMUSCULAR | Status: DC

## 2017-02-11 MED ORDER — LOPERAMIDE HCL 2 MG PO CAPS: 2.0000 mg | ORAL_CAPSULE | ORAL | Status: AC | PRN

## 2017-02-11 MED ORDER — LOPERAMIDE HCL 2 MG PO CAPS: 2.0000 mg | ORAL_CAPSULE | ORAL | Status: DC | PRN

## 2017-02-11 MED ORDER — ONDANSETRON 4 MG PO TBDP
4.0000 mg | ORAL_TABLET | Freq: Four times a day (QID) | ORAL | Status: DC | PRN
  Administered 2017-02-11: 4 mg via ORAL
  Filled 2017-02-11: qty 1

## 2017-02-11 MED ORDER — LORAZEPAM 1 MG PO TABS
1.0000 mg | ORAL_TABLET | Freq: Two times a day (BID) | ORAL | Status: AC
  Administered 2017-02-13 (×2): 1 mg via ORAL
  Filled 2017-02-11: qty 1

## 2017-02-11 MED ORDER — DICYCLOMINE HCL 20 MG PO TABS
20.0000 mg | ORAL_TABLET | Freq: Four times a day (QID) | ORAL | Status: AC | PRN
  Administered 2017-02-11 – 2017-02-14 (×3): 20 mg via ORAL
  Filled 2017-02-11 (×4): qty 1

## 2017-02-11 MED ORDER — LORAZEPAM 1 MG PO TABS
0.0000 mg | ORAL_TABLET | Freq: Four times a day (QID) | ORAL | Status: DC
  Administered 2017-02-11: 1 mg via ORAL
  Filled 2017-02-11 (×2): qty 1

## 2017-02-11 MED ORDER — LORAZEPAM 1 MG PO TABS
1.0000 mg | ORAL_TABLET | Freq: Four times a day (QID) | ORAL | Status: AC
  Administered 2017-02-11 (×2): 1 mg via ORAL
  Filled 2017-02-11 (×2): qty 1

## 2017-02-11 MED ORDER — NICOTINE 21 MG/24HR TD PT24
21.0000 mg | MEDICATED_PATCH | Freq: Every day | TRANSDERMAL | Status: DC
  Administered 2017-02-11 – 2017-02-22 (×12): 21 mg via TRANSDERMAL
  Filled 2017-02-11 (×16): qty 1

## 2017-02-11 MED ORDER — CLONIDINE HCL 0.1 MG PO TABS
0.1000 mg | ORAL_TABLET | Freq: Every day | ORAL | Status: AC
  Administered 2017-02-14 – 2017-02-16 (×3): 0.1 mg via ORAL
  Filled 2017-02-11 (×2): qty 1

## 2017-02-11 MED ORDER — HYDROXYZINE HCL 25 MG PO TABS
25.0000 mg | ORAL_TABLET | Freq: Four times a day (QID) | ORAL | Status: AC | PRN
  Administered 2017-02-11 – 2017-02-12 (×4): 25 mg via ORAL
  Filled 2017-02-11 (×4): qty 1

## 2017-02-11 MED ORDER — METHOCARBAMOL 500 MG PO TABS
500.0000 mg | ORAL_TABLET | Freq: Three times a day (TID) | ORAL | Status: AC | PRN
  Administered 2017-02-11 – 2017-02-15 (×5): 500 mg via ORAL
  Filled 2017-02-11 (×5): qty 1

## 2017-02-11 MED ORDER — ONDANSETRON 4 MG PO TBDP: 4.0000 mg | ORAL_TABLET | Freq: Four times a day (QID) | ORAL | Status: AC | PRN

## 2017-02-11 MED ORDER — LORAZEPAM 1 MG PO TABS: 0.0000 mg | ORAL_TABLET | Freq: Two times a day (BID) | ORAL | Status: DC

## 2017-02-11 MED ORDER — FOLIC ACID 1 MG PO TABS
1.0000 mg | ORAL_TABLET | Freq: Every day | ORAL | Status: DC
  Administered 2017-02-11 – 2017-02-22 (×12): 1 mg via ORAL
  Filled 2017-02-11 (×8): qty 1
  Filled 2017-02-11: qty 21
  Filled 2017-02-11 (×5): qty 1

## 2017-02-11 MED ORDER — ADULT MULTIVITAMIN W/MINERALS CH
1.0000 | ORAL_TABLET | Freq: Every day | ORAL | Status: DC
  Administered 2017-02-11 – 2017-02-22 (×12): 1 via ORAL
  Filled 2017-02-11 (×15): qty 1

## 2017-02-11 MED ORDER — LORAZEPAM 2 MG/ML IJ SOLN: 1.0000 mg | Freq: Four times a day (QID) | INTRAMUSCULAR | Status: DC | PRN

## 2017-02-11 MED ORDER — CLONIDINE HCL 0.1 MG PO TABS
0.1000 mg | ORAL_TABLET | Freq: Four times a day (QID) | ORAL | Status: AC
  Administered 2017-02-11 – 2017-02-12 (×7): 0.1 mg via ORAL
  Filled 2017-02-11 (×9): qty 1

## 2017-02-11 MED ORDER — MIRTAZAPINE 7.5 MG PO TABS
7.5000 mg | ORAL_TABLET | Freq: Every day | ORAL | Status: DC
  Administered 2017-02-11: 7.5 mg via ORAL
  Filled 2017-02-11 (×4): qty 1

## 2017-02-11 MED ORDER — VITAMIN B-1 100 MG PO TABS
100.0000 mg | ORAL_TABLET | Freq: Every day | ORAL | Status: DC
  Administered 2017-02-11 – 2017-02-22 (×12): 100 mg via ORAL
  Filled 2017-02-11 (×15): qty 1

## 2017-02-11 MED ORDER — CLONIDINE HCL 0.1 MG PO TABS
0.1000 mg | ORAL_TABLET | ORAL | Status: AC
  Administered 2017-02-12 – 2017-02-14 (×4): 0.1 mg via ORAL
  Filled 2017-02-11 (×4): qty 1

## 2017-02-11 MED ORDER — ALUM & MAG HYDROXIDE-SIMETH 200-200-20 MG/5ML PO SUSP: 30.0000 mL | ORAL | Status: DC | PRN

## 2017-02-11 MED ORDER — LORAZEPAM 1 MG PO TABS
1.0000 mg | ORAL_TABLET | Freq: Four times a day (QID) | ORAL | Status: DC | PRN
  Administered 2017-02-11: 1 mg via ORAL

## 2017-02-11 NOTE — [facility] Group Notes (Signed)
[facility] LCSW Group Therapy 02/11/2017 1:15pm  Type of Therapy: Group Therapy- Feelings Around Relapse and Recovery  Participation Level: Pt invited. Did not attend.   Oval Moralez B Airika Alkhatib, MSW, LCSWA [phone] 02/11/2017 3:57 PM  

## 2017-02-11 NOTE — [facility] Suicide Risk Assessment (Signed)
[facility] INPATIENT:  Family/Significant Other Suicide Prevention Education  Suicide Prevention Education:  Patient Refusal for Family/Significant Other Suicide Prevention Education: The patient Daniel Arroyo has refused to provide written consent for family/significant other to be provided Family/Significant Other Suicide Prevention Education during admission and/or prior to discharge.  Physician notified.  Pt declined family contact. SPE discussed with pt.  Jonathon JordanLynn B Ramla Hase, MSW, LCSWA  02/11/2017, 4:55 PM

## 2017-02-11 NOTE — Progress Notes (Signed)
Admission Note:  46 year old male who presents voluntary, in no acute distress, for the treatment of SI and Depression. Prior to admission, patient reports he was experiencing SI with a plan to "use a gun and kill myself".  Patient appears flat and depressed.  Patient was calm and cooperative with admission process.  Patient currently presents with passive SI and contracts for safety upon admission. Patient denies AVH.  Patient reports substance abuse of Alcohol, Cocaine, and Heroin.  Patient verbalizes withdrawal symptoms of Sweats/chills, tremors, nausea, auditory disturbances "ringing in the ears", visual disturbances "seeing spots", and increased anxiety.  Patient identifies stressors of "lost job this past week", "Got a DUI on the 6th of this month", "I have a ticket to pay off by the 31st and I won't have the money", "Substance abuse", and "fear of homelessness".  Patient reports poor sleep.  Patient currently lives alone and identifies "friends" as his support system.  [facility] patient's goal is "getting clean" and "being more positive".  Skin was assessed and found to be clear of any abnormal marks.  Patient searched and no contraband found, POC and unit policies explained and understanding verbalized. Consents obtained. Food and fluids offered and accepted. Patient placed on q 15 minute safety checks.  Patient had no additional questions or concerns.

## 2017-02-11 NOTE — [facility] Counselor (Signed)
Adult Comprehensive Assessment  Patient ID: Daniel Arroyo, male   DOB: 08-11-1971, 46 y.o.   MRN: [id number]  Information Source: Information source: Patient  Current Stressors:  Educational / Learning stressors: None reported  Employment / Job issues: Pt is currently unemployed  Family Relationships: Distant relationships with family members  Surveyor, quantity / Lack of resources (include bankruptcy): Limited resources  Housing / Lack of housing: Currently homeless  Physical health (include injuries & life threatening diseases): None reported  Social relationships: No social supports identified  Substance abuse: Polysubstance use  Bereavement / Loss: Pt lost both of his brothers to overdoses, and both of his parents are also deceased   Living/Environment/Situation:  Living Arrangements: Alone Living conditions (as described by patient or guardian): Pt is currently homeless. Pt was kicked out of his [employer] because he relapsed.  How long has patient lived in current situation?: About a week What is atmosphere in current home: Temporary, Dangerous  Family History:  Marital status: Divorced Divorced, when?: 2007 What types of issues is patient dealing with in the relationship?: Pt's Substance abuse issues Additional relationship information: patient married twice.  one time for 2 years, first marriage was for 10 years.   Are you sexually active?: No What is your sexual orientation?: Heterosexual Has your sexual activity been affected by drugs, alcohol, medication, or emotional stress?: "Probably" Does patient have children?: Yes How many children?: 2 How is patient's relationship with their children?: Patient states that one of his children lives in [place] so he doesn't see them often. His other child lives in [place] and pt sees them every 2 weeks or so.  Childhood History:  By whom was/is the patient raised?: Both parents, Mother, Father Additional childhood history  information: Pt's parents seperated when he was 56 and mother died shortly thereafter; father returned to home and pt and his twin left when they were 27 Description of patient's relationship with caregiver when they were a child: Strained with both yet pt reports better with mother How were you disciplined when you got in trouble as a child/adolescent?: Spankings, slapping,  yelling Does patient have siblings?: Yes Number of Siblings: 2 Description of patient's current relationship with siblings: patient has 2 brothers, however both are deceased due to addiction issues Did patient suffer any verbal/emotional/physical/sexual abuse as a child?: No Did patient suffer from severe childhood neglect?: No Has patient ever been sexually abused/assaulted/raped as an adolescent or adult?: No Witnessed domestic violence?: No Has patient been effected by domestic violence as an adult?: No  Education:  Highest grade of school patient has completed: Master's degree in accounting  Currently a student?: No Learning disability?: No  Employment/Work Situation:   Employment situation: Unemployed (Pt states he lost he job a week ago due to attendance issues; he was a Research scientist (medical) at [employer] ) Patient's job has been impacted by current illness: Yes Describe how patient's job has been impacted: Pt has been unable to keep a job due to his addiction issues  What is the longest time patient has a held a job?: 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 [employer] Where was the patient employed at that time?: unknown patient did not report Has patient ever been in the [employer]?: No Has patient ever served in combat?: No Did You Receive Any Psychiatric Treatment/Services While in Equities trader?:  (NA) Are There Guns or Other Weapons in Your Home?: No  Financial Resources:   Financial resources: No income  Alcohol/Substance Abuse:   What has been  your use of drugs/alcohol within the last 12 months?: Alcohol, heroin, and cocaine  use daily  Alcohol/Substance Abuse Treatment Hx: Past Tx, Inpatient If yes, describe treatment: [facility] a few moths ago   Social Support System:   Forensic psychologistatient's Community Support System: None Museum/gallery exhibitions officerDescribe Community Support System: Pt states that he has no supports in his lfie currently  Type of faith/religion: None  How does patient's faith help to cope with current illness?: NA  Leisure/Recreation:   Leisure and Hobbies: Working   Strengths/Needs:   What things does the patient do well?: Reading In what areas does patient struggle / problems for patient: Pt states he wants to have a healthier attitude and a healthier way of thinking   Discharge Plan:   Does patient have access to transportation?: Yes (Pt's car in the parking lot ) Will patient be returning to same living situation after discharge?: No Plan for living situation after discharge: [facility] Currently receiving community mental health services: No If no, would patient like referral for services when discharged?: Yes (What county?) ([place]) Does patient have financial barriers related to discharge medications?: Yes Patient description of barriers related to discharge medications: Limited resources   Summary/Recommendations:     Patient is a 46 yo divorced male who presented to the hospital with substance use and SI. Primary triggers for admission include increasing depression, increasing substance use, pt losing his job, and pt losing his housing. [facility] but was evicted after he relapsed about a week ago. Pt has a Masters Degree in accounting and has worked as a IT trainerCPA in the past, however, has been unable to maintain employment over the past few years due to his struggle with addiction. During the time of the assessment pt was somewhat guarded, but still pleasant. Pt's affect was appropriate. Pt is agreeable to [facility] for outpatient services and [facility]. Pt denies having any supports in his life at this time. Patient will benefit from crisis stabilization, medication evaluation, group therapy and pyschoeducation, in addition to case management for discharge planning. At discharge, it is recommended that pt remain compliant with the established discharge plan and continue treatment.  Jonathon JordanLynn B Jaciel Diem, MSW, Theresia MajorsLCSWA  02/11/2017

## 2017-02-11 NOTE — Tx Team (Signed)
Interdisciplinary Treatment and Diagnostic Plan Update 02/11/2017 Time of Session: 9:30am  Daniel Arroyo  MRN: [id number]  Principal Diagnosis: <principal problem not specified>  Secondary Diagnoses: Active Problems:   Major depressive disorder, recurrent severe without psychotic features ([place])   Current Medications:  Current Facility-Administered Medications  Medication Dose Route Frequency Provider Last Rate Last Dose  . acetaminophen (TYLENOL) tablet 650 mg  650 mg Oral Q6H PRN Patrecia Pour, NP      . alum & mag hydroxide-simeth (MAALOX/MYLANTA) 200-200-20 MG/5ML suspension 30 mL  30 mL Oral Q4H PRN Patrecia Pour, NP      . cloNIDine (CATAPRES) tablet 0.1 mg  0.1 mg Oral QID Patrecia Pour, NP   0.1 mg at 02/11/17 1011   Followed by  . [START ON 02/13/2017] cloNIDine (CATAPRES) tablet 0.1 mg  0.1 mg Oral BH-qamhs Lord, Asa Saunas, NP       Followed by  . [START ON 02/15/2017] cloNIDine (CATAPRES) tablet 0.1 mg  0.1 mg Oral QAC breakfast Patrecia Pour, NP      . dicyclomine (BENTYL) tablet 20 mg  20 mg Oral Q6H PRN Patrecia Pour, NP      . folic acid (FOLVITE) tablet 1 mg  1 mg Oral Daily Patrecia Pour, NP   1 mg at 02/11/17 1007  . hydrOXYzine (ATARAX/VISTARIL) tablet 25 mg  25 mg Oral Q6H PRN Patrecia Pour, NP   25 mg at 02/11/17 0120  . loperamide (IMODIUM) capsule 2-4 mg  2-4 mg Oral PRN Patrecia Pour, NP      . LORazepam (ATIVAN) tablet 1 mg  1 mg Oral Q6H PRN Patrecia Pour, NP   1 mg at 02/11/17 1014   Or  . LORazepam (ATIVAN) injection 1 mg  1 mg Intravenous Q6H PRN Patrecia Pour, NP      . LORazepam (ATIVAN) tablet 0-4 mg  0-4 mg Oral Q6H Patrecia Pour, NP   1 mg at 02/11/17 3845   Followed by  . [START ON 02/13/2017] LORazepam (ATIVAN) tablet 0-4 mg  0-4 mg Oral Q12H Lord, Jamison Y, NP      . magnesium hydroxide (MILK OF MAGNESIA) suspension 30 mL  30 mL Oral Daily PRN Patrecia Pour, NP      . methocarbamol (ROBAXIN) tablet 500 mg  500 mg Oral Q8H  PRN Patrecia Pour, NP   500 mg at 02/11/17 0120  . multivitamin with minerals tablet 1 tablet  1 tablet Oral Daily Patrecia Pour, NP   1 tablet at 02/11/17 1007  . naproxen (NAPROSYN) tablet 500 mg  500 mg Oral BID PRN Patrecia Pour, NP   500 mg at 02/11/17 1012  . nicotine (NICODERM CQ - dosed in mg/24 hours) patch 21 mg  21 mg Transdermal Daily Lindon Romp A, NP   21 mg at 02/11/17 1005  . ondansetron (ZOFRAN-ODT) disintegrating tablet 4 mg  4 mg Oral Q6H PRN Patrecia Pour, NP      . thiamine (VITAMIN B-1) tablet 100 mg  100 mg Oral Daily Patrecia Pour, NP   100 mg at 02/11/17 1006   Or  . thiamine (B-1) injection 100 mg  100 mg Intravenous Daily Patrecia Pour, NP        PTA Medications: Prescriptions Prior to Admission  Medication Sig Dispense Refill Last Dose  . cetirizine (ZYRTEC) 10 MG tablet Take 1 tablet (10 mg total) by mouth daily. (Patient not taking:  Reported on 02/10/2017) 30 tablet 0 Not Taking at Unknown time  . fluticasone (FLONASE) 50 MCG/ACT nasal spray Place 2 sprays into both nostrils daily. (Patient not taking: Reported on 02/10/2017) 16 g 0 Not Taking at Unknown time  . gabapentin (NEURONTIN) 300 MG capsule Take 1 capsule (300 mg total) by mouth 3 (three) times daily. (Patient not taking: Reported on 02/10/2017) 90 capsule 0 Not Taking at Unknown time  . hydrOXYzine (ATARAX/VISTARIL) 25 MG tablet Take 1 tablet (25 mg total) by mouth every 6 (six) hours as needed for anxiety. (Patient not taking: Reported on 02/10/2017) 30 tablet 0 Not Taking at Unknown time  . mirtazapine (REMERON) 15 MG tablet Take 1 tablet (15 mg total) by mouth at bedtime. (Patient not taking: Reported on 02/10/2017) 30 tablet 0 Not Taking at Unknown time  . naltrexone (DEPADE) 50 MG tablet Take 1 tablet (50 mg total) by mouth daily. (Patient not taking: Reported on 02/10/2017) 30 tablet 0 Not Taking at Unknown time  . nicotine (NICODERM CQ - DOSED IN MG/24 HOURS) 21 mg/24hr patch Place 1 patch (21  mg total) onto the skin daily at 6 (six) AM. (Patient not taking: Reported on 02/10/2017) 28 patch 0 Not Taking at Unknown time  . pantoprazole (PROTONIX) 20 MG tablet Take 1 tablet (20 mg total) by mouth daily. (Patient not taking: Reported on 02/10/2017) 30 tablet 0 Not Taking at Unknown time  . thiamine 100 MG tablet Take 1 tablet (100 mg total) by mouth daily. (Patient not taking: Reported on 02/10/2017) 30 tablet 0 Not Taking at Unknown time    Treatment Modalities: Medication Management, Group therapy, Case management,  1 to 1 session with clinician, Psychoeducation, Recreational therapy.  Patient Stressors: Financial difficulties Occupational concerns Substance abuse Patient Strengths: Ability for insight Average or above average intelligence Capable of independent living Agricultural engineer for treatment/growth Supportive family/friends  Physician Treatment Plan for Primary Diagnosis: <principal problem not specified> Long Term Goal(s): Improvement in symptoms so as ready for discharge Short Term Goals:    Medication Management: Evaluate patient's response, side effects, and tolerance of medication regimen.  Therapeutic Interventions: 1 to 1 sessions, Unit Group sessions and Medication administration.  Evaluation of Outcomes: Not Met  Physician Treatment Plan for Secondary Diagnosis: Active Problems:   Major depressive disorder, recurrent severe without psychotic features ([place])  Long Term Goal(s): Improvement in symptoms so as ready for discharge  Short Term Goals:    Medication Management: Evaluate patient's response, side effects, and tolerance of medication regimen.  Therapeutic Interventions: 1 to 1 sessions, Unit Group sessions and Medication administration.  Evaluation of Outcomes: Not Met  RN Treatment Plan for Primary Diagnosis: <principal problem not specified> Long Term Goal(s): Knowledge of disease and therapeutic regimen to maintain health will  improve  Short Term Goals: Ability to remain free from injury will improve and Compliance with prescribed medications will improve  Medication Management: RN will administer medications as ordered by provider, will assess and evaluate patient's response and provide education to patient for prescribed medication. RN will report any adverse and/or side effects to prescribing provider.  Therapeutic Interventions: 1 on 1 counseling sessions, Psychoeducation, Medication administration, Evaluate responses to treatment, Monitor vital signs and CBGs as ordered, Perform/monitor CIWA, COWS, AIMS and Fall Risk screenings as ordered, Perform wound care treatments as ordered.  Evaluation of Outcomes: Not Met  LCSW Treatment Plan for Primary Diagnosis: <principal problem not specified> Long Term Goal(s): Safe transition to appropriate next level of care at  discharge, Engage patient in therapeutic group addressing interpersonal concerns. Short Term Goals: Engage patient in aftercare planning with referrals and resources, Facilitate patient progression through stages of change regarding substance use diagnoses and concerns, Identify triggers associated with mental health/substance abuse issues and Increase skills for wellness and recovery  Therapeutic Interventions: Assess for all discharge needs, 1 to 1 time with Social worker, Explore available resources and support systems, Assess for adequacy in community support network, Educate family and significant other(s) on suicide prevention, Complete Psychosocial Assessment, Interpersonal group therapy.  Evaluation of Outcomes: Not Met  Progress in Treatment: Attending groups: Pt is new to milieu, continuing to assess  Participating in groups: Pt is new to milieu, continuing to assess  Taking medication as prescribed: Yes, MD continues to assess for medication changes as needed Toleration medication: Yes, no side effects reported at this time Family/Significant  other contact made: No, CSW assessing for appropriate contact Patient understands diagnosis: Continuing to assess Discussing patient identified problems/goals with staff: Yes Medical problems stabilized or resolved: Yes Denies suicidal/homicidal ideation: No, pt recently admitted for SI. Issues/concerns per patient self-inventory: None Other: N/A  New problem(s) identified: None identified at this time.   New Short Term/Long Term Goal(s): None identified at this time.   Discharge Plan or Barriers: Pt [facility] for treatment or follow-up outpatient with [facility].  Reason for Continuation of Hospitalization:  Depression Medication stabilization Suicidal ideation Withdrawal symptoms  Estimated Length of Stay: 3-5 days  Attendees: Patient: 02/11/2017 10:19 AM  Physician: Dr. Parke Poisson 02/11/2017 10:19 AM  Nursing: Chong Sicilian RN; Kieth Brightly, RN 02/11/2017 10:19 AM  RN Care Manager: Lars Pinks, RN 02/11/2017 10:19 AM  Social Worker: Adriana Reams, LCSW; Matthew Saras, [place] 02/11/2017 10:19 AM  Recreational Therapist:  02/11/2017 10:19 AM  Other: Lindell Spar, NP; Samuel Jester, NP 02/11/2017 10:19 AM  Other:  02/11/2017 10:19 AM  Other: 02/11/2017 10:19 AM   Scribe for Treatment Team: Georga Kaufmann, MSW,LCSWA 02/11/2017 10:19 AM

## 2017-02-11 NOTE — Progress Notes (Signed)
Daniel Arroyo is quite miserable. He stands in front of the med window and says " I feel awful". A He completed his daily assessment and on this he wrote he deneid SI today and he rated his depression, hopelessness and anxiety " 9/9/8", respectivelyR Safety in place. Ativan helping with withdrawal symptoms.

## 2017-02-11 NOTE — Tx Team (Signed)
Initial Treatment Plan 02/11/2017 1:32 AM Daniel Arroyo OZH:[id number]    PATIENT STRESSORS: Financial difficulties Occupational concerns Substance abuse   PATIENT STRENGTHS: Ability for insight Average or above average intelligence Capable of independent living Barrister's clerkCommunication skills Motivation for treatment/growth Supportive family/friends   PATIENT IDENTIFIED PROBLEMS: At risk for suicide  Substance Abuse  "Getting clean"  "Being more positive"               DISCHARGE CRITERIA:  Ability to meet basic life and health needs Improved stabilization in mood, thinking, and/or behavior Motivation to continue treatment in a less acute level of care Need for constant or close observation no longer present Verbal commitment to aftercare and medication compliance Withdrawal symptoms are absent or subacute and managed without 24-hour nursing intervention  PRELIMINARY DISCHARGE PLAN: Attend 12-step recovery group Outpatient therapy Return to previous living arrangement  PATIENT/FAMILY INVOLVEMENT: This treatment plan has been presented to and reviewed with the patient, Daniel Arroyo.  The patient and family have been given the opportunity to ask questions and make suggestions.  Carleene OverlieMiddleton, Tymesha Ditmore P, RN 02/11/2017, 1:32 AM

## 2017-02-11 NOTE — Progress Notes (Signed)
Recreation Therapy Notes  Date: 02/11/17 Time: 0930 Location: [address] Dayroom  Group Topic: Stress Management  Goal Area(s) Addresses:  Patient will verbalize importance of using healthy stress management.  Patient will identify positive emotions associated with healthy stress management.   Intervention: Stress Management  Activity :  Gratitude Meditation.  LRT introduced the stress management technique of meditation to patients.  LRT played a meditation from the Calm app to allow patients to engage in the technique.  Patients were to follow along as the meditation played to participate in the practice.  Education:  Stress Management, Discharge Planning.   Education Outcome: Acknowledges edcuation/In group clarification offered/Needs additional education  Clinical Observations/Feedback: Pt did not attend group.   Hady Niemczyk, LRT/CTRS         Nohemy Koop A 02/11/2017 11:23 AM 

## 2017-02-11 NOTE — ED Notes (Signed)
Pt transported to [facility] by [facility] transportation service for continuation of specialized care. Belongings given to driver after patient signed for them. Pt left in no acute distress. 

## 2017-02-11 NOTE — [facility] Suicide Risk Assessment (Addendum)
[facility] Admission Suicide Risk Assessment   Nursing information obtained from:  Patient Demographic factors:  Male, Divorced or widowed, Caucasian, Living alone, Unemployed, Access to firearms Current Mental Status:  Suicidal ideation indicated by patient, Suicide plan, Self-harm thoughts, Self-harm behaviors Loss Factors:  Decrease in vocational status, Legal issues, Financial problems / change in socioeconomic status Historical Factors:  Family history of mental illness or substance abuse, Impulsivity Risk Reduction Factors:  Positive social support  Total Time spent with patient: 45 minutes Principal Problem: Alcohol, Opiate Dependence , Substance Induced Mood Disorder Diagnosis:   Patient Active Problem List   Diagnosis Date Noted  . Major depressive disorder, recurrent (HCC) [F33.9] 07/17/2016  . Alcohol-induced mood disorder (HCC) [F10.94] 06/10/2016  . Cocaine dependence (HCC) [F14.20] 04/23/2014  . Substance induced mood disorder (HCC) [F19.94] 04/23/2014  . MDD (major depressive disorder) (HCC) [F32.9] 04/22/2014  . Alcohol use disorder, severe, dependence (HCC) [F10.20] 12/14/2013  . Major depressive disorder, recurrent severe without psychotic features (HCC) [F33.2] 12/14/2013  . Adult ADHD [F90.9] 12/14/2013  . Polysubstance abuse [F19.10] 12/13/2013  . Alcohol withdrawal (HCC) [F10.239] 10/28/2013    Continued Clinical Symptoms:  Alcohol Use Disorder Identification Test Final Score (AUDIT): 33 The "Alcohol Use Disorders Identification Test", Guidelines for Use in Primary Care, Second Edition.  World Science writer [facility]). Score between 0-7:  no or low risk or alcohol related problems. Score between 8-15:  moderate risk of alcohol related problems. Score between 16-19:  high risk of alcohol related problems. Score 20 or above:  warrants further diagnostic evaluation for alcohol dependence and treatment.   CLINICAL FACTORS:  46 year old divorced male, has two children,  currently unemployed, currently homeless . 46 year old man, presented to ED voluntarily requesting detoxification and reporting depression, suicidal ideations.  Reports history of substance abuse, has been drinking daily, up to pints of liquor daily. Also has been abusing crack cocaine and  heroin ( IVDA and insufflated ) . He has also occasionally been using Xanax, but not consistently. Admission BAL 161, admission UDS positive of  BZDs and Cocaine . States that due to his substance abuse, he was fired from his job last week. This in turn resulted in eviction due to inability to pay rent. Also has an upcoming DUI related court date. Denies history of alcohol related seizures or DTs.  Denies medical illnesses .  In the past had been prescribed Neurontin, Remeron, Naltrexone, but has not been taking for several months .  States he remembers Remeron as particularly helpful. Currently endorsing some cramps, nausea, but no vomiting. Feels jittery but no gross distal tremors or diaphoresis at this time. Dx- Alcohol, Opiate Dependencies, Substance Induced Mood Disorder, Depressed  Plan- inpatient admission, detox protocols to minimize risk of alcohol or opiate withdrawal. Start Remeron 7.5 mgrs QHS for depression and insomnia.     Musculoskeletal: Strength & Muscle Tone: within normal limits currently no tremors, no diaphoresis, no acute restlessness and vitals are stable  Gait & Station: normal Patient leans: N/A  Psychiatric Specialty Exam: Physical Exam  ROS   Mild headache, no chest pain, no dyspnea, (+) nausea, no vomiting  Blood pressure (!) 141/89, pulse 66, temperature 98.4 F (36.9 C), temperature source Oral, resp. rate 16, height 6' (1.829 m), weight 79.4 kg (175 lb).Body mass index is 23.73 kg/m.  General Appearance: Fairly Groomed  Eye Contact:  Fair  Speech:  Normal Rate  Volume:  Normal  Mood:  Anxious and Depressed  Affect:  constricted  Thought Process:  Linear and  Descriptions of Associations: Intact  Orientation:  Other:  fully alert and attentive   Thought Content:  denies hallucinations, no delusions  Suicidal Thoughts:  No denies any current suicidal or self injurious ideations at this time, and contracts for safety on unit   Homicidal Thoughts:  No denies any homicidal ideations  Memory:  recent and remote grossly intact   Judgement:  Fair  Insight:  Fair  Psychomotor Activity:  Normal  Concentration:  Concentration: Good and Attention Span: Good  Recall:  Good  Fund of Knowledge:  Good  Language:  Good  Akathisia:  Negative  Handed:  Right  AIMS (if indicated):     Assets:  Communication Skills Desire for Improvement Resilience  ADL's:  Intact  Cognition:  WNL  Sleep:  Number of Hours: 1      COGNITIVE FEATURES THAT CONTRIBUTE TO RISK:  Closed-mindedness and Loss of executive function    SUICIDE RISK:   Moderate:  Frequent suicidal ideation with limited intensity, and duration, some specificity in terms of plans, no associated intent, good self-control, limited dysphoria/symptomatology, some risk factors present, and identifiable protective factors, including available and accessible social support.  PLAN OF CARE: Patient will be admitted to inpatient psychiatric unit for stabilization and safety. Will provide and encourage milieu participation. Provide medication management and maked adjustments as needed. Will also provide medication management to minimize risk of withdrawal .  Will follow daily.    I certify that inpatient services furnished can reasonably be expected to improve the patient's condition.   Craige CottaFernando A Pearl Bents, MD 02/11/2017, 5:36 PM

## 2017-02-11 NOTE — Progress Notes (Signed)
Pt attend group. His day was a 4. Pt said he did not have a goal for today. He just wanted to get in.

## 2017-02-11 NOTE — H&P (Signed)
Psychiatric Admission Assessment Adult  Patient Identification: Daniel Arroyo  MRN:  [id number]  Date of Evaluation:  02/11/2017  Chief Complaint:  MDD REC SEV ALCOHOL USE DISORDER COCAINE USE DISORDER OPIOID USE DISORDER  Principal Diagnosis: Polysubstance use disorder including opioid drugs.  Diagnosis:   Patient Active Problem List   Diagnosis Date Noted  . Major depressive disorder, recurrent ([place]) [F33.9] 07/17/2016  . Alcohol-induced mood disorder ([place]) [F10.94] 06/10/2016  . Cocaine dependence ([place]) [F14.20] 04/23/2014  . Substance induced mood disorder ([place]) [F19.94] 04/23/2014  . MDD (major depressive disorder) ([place]) [F32.9] 04/22/2014  . Alcohol use disorder, severe, dependence ([place]) [F10.20] 12/14/2013  . Major depressive disorder, recurrent severe without psychotic features ([place]) [F33.2] 12/14/2013  . Adult ADHD [F90.9] 12/14/2013  . Polysubstance abuse [F19.10] 12/13/2013  . Alcohol withdrawal ([place]) [F10.239] 10/28/2013   History of Present Illness: This is an admission assessment for this 46 year old Caucasian male known to our unit for polysubstance use disorder. Admitted to the [facility] from the [facility] with complaints of worsening drugs & alcohol use. Patient is seeking detoxification treatments & a referral to [facility] because he says has heard a lot of good things about their program. During this assessment, Daniel Arroyo reports, "I went to the [facility] on Thursday afternoon. My drinking & drug use were out of control. I lost my job & place to live because of it. I was living in an [place], has to leave on my own because I was not doing the right thing. I cannot stop drinking or using. When ever I tried to stop, I will go crazy in my freaking mind. I have been using & drinking heavily x 6 months.. I feel like I have lost everything. Please, I will need medicines for alcohol & opioid cravings & I would like to get into [facility] after discharge. I'm so mad at  myself that I feel like ending it all".   Of note, patient denies any hx of attempts. No familial history of attempts. Twin brother is a heavy drug & alcohol user.  Associated Signs/Symptoms: Depression Symptoms:  depressed mood, insomnia, fatigue, feelings of worthlessness/guilt, suicidal thoughts without plan,  (Hypo) Manic Symptoms:  Impulsivity, Irritable Mood, Labiality of Mood,  Anxiety Symptoms:  Excessive Worry,  Psychotic Symptoms:  Denies any hallucinations, delusions or paranoia.  PTSD Symptoms: Avoidance:  Decreased Interest/Participation  Total Time spent with patient: 1 hour  Past Psychiatric History: Polysubstance use disorder.  Is the patient at risk to self? Yes.    Has the patient been a risk to self in the past 6 months? Yes.    Has the patient been a risk to self within the distant past? No.  Is the patient a risk to others? No.  Has the patient been a risk to others in the past 6 months? No.  Has the patient been a risk to others within the distant past? No.   Prior Inpatient Therapy: Yes Prior Outpatient Therapy:   Alcohol Screening: 1. How often do you have a drink containing alcohol?: 4 or more times a week 2. How many drinks containing alcohol do you have on a typical day when you are drinking?: 10 or more 3. How often do you have six or more drinks on one occasion?: Daily or almost daily Preliminary Score: 8 4. How often during the last year have you found that you were not able to stop drinking once you had started?: Daily or almost daily 5. How  often during the last year have you failed to do what was normally expected from you becasue of drinking?: Weekly 6. How often during the last year have you needed a first drink in the morning to get yourself going after a heavy drinking session?: Daily or almost daily 7. How often during the last year have you had a feeling of guilt of remorse after drinking?: Daily or almost daily 8. How often during  the last year have you been unable to remember what happened the night before because you had been drinking?: Monthly 9. Have you or someone else been injured as a result of your drinking?: No 10. Has a relative or friend or a doctor or another health worker been concerned about your drinking or suggested you cut down?: Yes, during the last year Alcohol Use Disorder Identification Test Final Score (AUDIT): 33 Brief Intervention: Yes Substance Abuse History in the last 12 months:  Yes.   Consequences of Substance Abuse: Withdrawal Symptoms:   Headaches Nausea Tremors  Previous Psychotropic Medications: YES  Psychological Evaluations: YES  Past Medical History:  Past Medical History:  Diagnosis Date  . ADHD, adult residual type   . Anxiety   . Anxiety and depression   . Bipolar disorder ([place])   . Depression   . Gastroesophageal reflux disease   . Knee pain   . Polysubstance abuse     Past Surgical History:  Procedure Laterality Date  . TONSILLECTOMY     Family History: History reviewed. No pertinent family history.  Family Psychiatric  History:   Tobacco Screening: Have you used any form of tobacco in the last 30 days? (Cigarettes, Smokeless Tobacco, Cigars, and/or Pipes): Yes Tobacco use, Select all that apply: 5 or more cigarettes per day Are you interested in Tobacco Cessation Medications?: Yes, will notify MD for an order Counseled patient on smoking cessation including recognizing danger situations, developing coping skills and basic information about quitting provided: Yes  Social History:  History  Alcohol Use  . Yes    Comment: in rehab      History  Drug Use  . Types: Heroin, Marijuana    Comment: in rehab    Additional Social History: Marital status: Divorced Divorced, when?: 2007 What types of issues is patient dealing with in the relationship?: Pt's Substance abuse issues Additional relationship information: patient married twice.  one time for 2  years, first marriage was for 10 years.   Are you sexually active?: No What is your sexual orientation?: Heterosexual Has your sexual activity been affected by drugs, alcohol, medication, or emotional stress?: "Probably" Does patient have children?: Yes How many children?: 2 How is patient's relationship with their children?: Patient states that one of his children lives in [place] so he doesn't see them often. His other child lives in [place] and pt sees them every 2 weeks or so.   Allergies:  No Known Allergies  Lab Results:  Results for orders placed or performed during the hospital encounter of 02/10/17 (from the past 48 hour(s))  Comprehensive metabolic panel     Status: Abnormal   Collection Time: 02/10/17  4:10 AM  Result Value Ref Range   Sodium 142 135 - 145 mmol/L   Potassium 4.0 3.5 - 5.1 mmol/L   Chloride 111 101 - 111 mmol/L   CO2 22 22 - 32 mmol/L   Glucose, Bld 88 65 - 99 mg/dL   BUN <5 (L) 6 - 20 mg/dL   Creatinine, Ser 0.75 0.61 -  1.24 mg/dL   Calcium 8.6 (L) 8.9 - 10.3 mg/dL   Total Protein 6.7 6.5 - 8.1 g/dL   Albumin 3.5 3.5 - 5.0 g/dL   AST 48 (H) 15 - 41 U/L   ALT 47 17 - 63 U/L   Alkaline Phosphatase 63 38 - 126 U/L   Total Bilirubin 0.2 (L) 0.3 - 1.2 mg/dL   GFR calc non Af Amer >60 >60 mL/min   GFR calc Af Amer >60 >60 mL/min    Comment: (NOTE) The eGFR has been calculated using the CKD EPI equation. This calculation has not been validated in all clinical situations. eGFR's persistently <60 mL/min signify possible Chronic Kidney Disease.    Anion gap 9 5 - 15  Ethanol     Status: Abnormal   Collection Time: 02/10/17  4:10 AM  Result Value Ref Range   Alcohol, Ethyl (B) 161 (H) <5 mg/dL    Comment:        LOWEST DETECTABLE LIMIT FOR SERUM ALCOHOL IS 5 mg/dL FOR MEDICAL PURPOSES ONLY   Salicylate level     Status: None   Collection Time: 02/10/17  4:10 AM  Result Value Ref Range   Salicylate Lvl <4.0 2.8 - 30.0 mg/dL  Acetaminophen  level     Status: Abnormal   Collection Time: 02/10/17  4:10 AM  Result Value Ref Range   Acetaminophen (Tylenol), Serum <10 (L) 10 - 30 ug/mL    Comment:        THERAPEUTIC CONCENTRATIONS VARY SIGNIFICANTLY. A RANGE OF 10-30 ug/mL MAY BE AN EFFECTIVE CONCENTRATION FOR MANY PATIENTS. HOWEVER, SOME ARE BEST TREATED AT CONCENTRATIONS OUTSIDE THIS RANGE. ACETAMINOPHEN CONCENTRATIONS >150 ug/mL AT 4 HOURS AFTER INGESTION AND >50 ug/mL AT 12 HOURS AFTER INGESTION ARE OFTEN ASSOCIATED WITH TOXIC REACTIONS.   cbc     Status: None   Collection Time: 02/10/17  4:10 AM  Result Value Ref Range   WBC 7.4 4.0 - 10.5 K/uL   RBC 4.22 4.22 - 5.81 MIL/uL   Hemoglobin 14.3 13.0 - 17.0 g/dL   HCT 40.9 39.0 - 52.0 %   MCV 96.9 78.0 - 100.0 fL   MCH 33.9 26.0 - 34.0 pg   MCHC 35.0 30.0 - 36.0 g/dL   RDW 14.2 11.5 - 15.5 %   Platelets 268 150 - 400 K/uL  Rapid urine drug screen (hospital performed)     Status: Abnormal   Collection Time: 02/10/17  4:10 AM  Result Value Ref Range   Opiates NONE DETECTED NONE DETECTED   Cocaine POSITIVE (A) NONE DETECTED   Benzodiazepines POSITIVE (A) NONE DETECTED   Amphetamines NONE DETECTED NONE DETECTED   Tetrahydrocannabinol NONE DETECTED NONE DETECTED   Barbiturates NONE DETECTED NONE DETECTED    Comment:        DRUG SCREEN FOR MEDICAL PURPOSES ONLY.  IF CONFIRMATION IS NEEDED FOR ANY PURPOSE, NOTIFY LAB WITHIN 5 DAYS.        LOWEST DETECTABLE LIMITS FOR URINE DRUG SCREEN Drug Class       Cutoff (ng/mL) Amphetamine      1000 Barbiturate      200 Benzodiazepine   102 Tricyclics       725 Opiates          300 Cocaine          300 THC              50     Blood Alcohol level:  Lab Results  Component Value Date  ETH 161 (H) 02/10/2017   ETH 202 (H) 23/30/0762    Metabolic Disorder Labs:  Lab Results  Component Value Date   HGBA1C 4.9 12/14/2013   MPG 94 12/14/2013   No results found for: PROLACTIN No results found for: CHOL, TRIG,  HDL, CHOLHDL, VLDL, LDLCALC  Current Medications: Current Facility-Administered Medications  Medication Dose Route Frequency Provider Last Rate Last Dose  . acetaminophen (TYLENOL) tablet 650 mg  650 mg Oral Q6H PRN Patrecia Pour, NP      . alum & mag hydroxide-simeth (MAALOX/MYLANTA) 200-200-20 MG/5ML suspension 30 mL  30 mL Oral Q4H PRN Patrecia Pour, NP      . cloNIDine (CATAPRES) tablet 0.1 mg  0.1 mg Oral QID Patrecia Pour, NP   0.1 mg at 02/11/17 1313   Followed by  . [START ON 02/13/2017] cloNIDine (CATAPRES) tablet 0.1 mg  0.1 mg Oral BH-qamhs Lord, Asa Saunas, NP       Followed by  . [START ON 02/15/2017] cloNIDine (CATAPRES) tablet 0.1 mg  0.1 mg Oral QAC breakfast Patrecia Pour, NP      . dicyclomine (BENTYL) tablet 20 mg  20 mg Oral Q6H PRN Patrecia Pour, NP   20 mg at 02/11/17 1313  . folic acid (FOLVITE) tablet 1 mg  1 mg Oral Daily Patrecia Pour, NP   1 mg at 02/11/17 1007  . hydrOXYzine (ATARAX/VISTARIL) tablet 25 mg  25 mg Oral Q6H PRN Patrecia Pour, NP   25 mg at 02/11/17 1313  . loperamide (IMODIUM) capsule 2-4 mg  2-4 mg Oral PRN Patrecia Pour, NP      . LORazepam (ATIVAN) tablet 1 mg  1 mg Oral Q6H PRN Lindell Spar I, NP      . LORazepam (ATIVAN) tablet 1 mg  1 mg Oral QID Lindell Spar I, NP       Followed by  . [START ON 02/12/2017] LORazepam (ATIVAN) tablet 1 mg  1 mg Oral TID Lindell Spar I, NP       Followed by  . [START ON 02/13/2017] LORazepam (ATIVAN) tablet 1 mg  1 mg Oral BID Lindell Spar I, NP       Followed by  . [START ON 02/14/2017] LORazepam (ATIVAN) tablet 1 mg  1 mg Oral Daily Nwoko, Agnes I, NP      . magnesium hydroxide (MILK OF MAGNESIA) suspension 30 mL  30 mL Oral Daily PRN Patrecia Pour, NP      . methocarbamol (ROBAXIN) tablet 500 mg  500 mg Oral Q8H PRN Patrecia Pour, NP   500 mg at 02/11/17 0120  . multivitamin with minerals tablet 1 tablet  1 tablet Oral Daily Nwoko, Agnes I, NP      . naproxen (NAPROSYN) tablet 500 mg  500 mg  Oral BID PRN Patrecia Pour, NP   500 mg at 02/11/17 1012  . nicotine (NICODERM CQ - dosed in mg/24 hours) patch 21 mg  21 mg Transdermal Daily Lindon Romp A, NP   21 mg at 02/11/17 1005  . ondansetron (ZOFRAN-ODT) disintegrating tablet 4 mg  4 mg Oral Q6H PRN Nwoko, Agnes I, NP      . thiamine (VITAMIN B-1) tablet 100 mg  100 mg Oral Daily Patrecia Pour, NP   100 mg at 02/11/17 1006   Or  . thiamine (B-1) injection 100 mg  100 mg Intravenous Daily Patrecia Pour, NP  PTA Medications: Prescriptions Prior to Admission  Medication Sig Dispense Refill Last Dose  . cetirizine (ZYRTEC) 10 MG tablet Take 1 tablet (10 mg total) by mouth daily. (Patient not taking: Reported on 02/10/2017) 30 tablet 0 Not Taking at Unknown time  . fluticasone (FLONASE) 50 MCG/ACT nasal spray Place 2 sprays into both nostrils daily. (Patient not taking: Reported on 02/10/2017) 16 g 0 Not Taking at Unknown time  . gabapentin (NEURONTIN) 300 MG capsule Take 1 capsule (300 mg total) by mouth 3 (three) times daily. (Patient not taking: Reported on 02/10/2017) 90 capsule 0 Not Taking at Unknown time  . hydrOXYzine (ATARAX/VISTARIL) 25 MG tablet Take 1 tablet (25 mg total) by mouth every 6 (six) hours as needed for anxiety. (Patient not taking: Reported on 02/10/2017) 30 tablet 0 Not Taking at Unknown time  . mirtazapine (REMERON) 15 MG tablet Take 1 tablet (15 mg total) by mouth at bedtime. (Patient not taking: Reported on 02/10/2017) 30 tablet 0 Not Taking at Unknown time  . naltrexone (DEPADE) 50 MG tablet Take 1 tablet (50 mg total) by mouth daily. (Patient not taking: Reported on 02/10/2017) 30 tablet 0 Not Taking at Unknown time  . nicotine (NICODERM CQ - DOSED IN MG/24 HOURS) 21 mg/24hr patch Place 1 patch (21 mg total) onto the skin daily at 6 (six) AM. (Patient not taking: Reported on 02/10/2017) 28 patch 0 Not Taking at Unknown time  . pantoprazole (PROTONIX) 20 MG tablet Take 1 tablet (20 mg total) by mouth daily.  (Patient not taking: Reported on 02/10/2017) 30 tablet 0 Not Taking at Unknown time  . thiamine 100 MG tablet Take 1 tablet (100 mg total) by mouth daily. (Patient not taking: Reported on 02/10/2017) 30 tablet 0 Not Taking at Unknown time    Musculoskeletal: Strength & Muscle Tone: within normal limits Gait & Station: normal Patient leans: N/A  Psychiatric Specialty Exam: Physical Exam  Nursing note and vitals reviewed. Constitutional: He is oriented to person, place, and time. He appears well-developed.  HENT:  Head: Normocephalic.  Eyes: Pupils are equal, round, and reactive to light.  Neck: Normal range of motion.  Cardiovascular: Normal rate.   Respiratory: Effort normal.  Genitourinary:  Genitourinary Comments: Deferred  Musculoskeletal: Normal range of motion.  Neurological: He is alert and oriented to person, place, and time.  Skin: Skin is warm and dry.  Psychiatric: He has a normal mood and affect. His behavior is normal.    Review of Systems  Constitutional: Positive for chills, diaphoresis and malaise/fatigue.  HENT: Negative.   Eyes: Positive for blurred vision.  Respiratory: Negative.   Cardiovascular: Negative.   Gastrointestinal: Positive for nausea.  Genitourinary: Negative.   Musculoskeletal: Positive for joint pain and myalgias.  Skin: Negative.   Neurological: Positive for dizziness.  Endo/Heme/Allergies: Negative.   Psychiatric/Behavioral: Positive for depression, substance abuse and suicidal ideas. Negative for hallucinations and memory loss. The patient is nervous/anxious and has insomnia.     Blood pressure 140/77, pulse (!) 53, temperature 98.4 F (36.9 C), temperature source Oral, resp. rate 16, height 6' (1.829 m), weight 79.4 kg (175 lb).Body mass index is 23.73 kg/m.  General Appearance: Casual  Eye Contact:  Fair  Speech:  Clear and Coherent and Normal Rate  Volume:  Normal  Mood:  Anxious, Depressed and Irritable  Affect:  Congruent and Flat   Thought Process:  Coherent  Orientation:  Full (Time, Place, and Person)  Thought Content:  Denies any hallucinations, delusional thoughts or paranoia.  Suicidal Thoughts:  Yes.  without intent/plan  Homicidal Thoughts:  Denies any thoughts, plans or intent.  Memory:  Immediate;   Fair Recent;   Fair  Judgement:  Impaired  Insight:  Fair and Shallow  Psychomotor Activity:  Restlessness, anxiousness.  Concentration:  Concentration: Fair and Attention Span: Fair  Recall:  [employer] of Knowledge:  Fair  Language:  Good  Akathisia:  Negative  Handed:  Right  AIMS (if indicated):     Assets:  Communication Skills Desire for Improvement Social Support  ADL's:  Intact  Cognition:  WNL  Sleep:  Number of Hours: 1   Treatment Plan Summary: Daily contact with patient to assess and evaluate symptoms and progress in treatment and Medication management   Treatment Plan/Recommendations: 1. Admit for crisis management and stabilization, estimated length of stay 3-5 days.  2. Medication management to reduce current symptoms to base line and improve the patient's overall level of functioning: See Md's SRA & TX plan.  3. Treat health problems as indicated.  4. Develop treatment plan to decrease risk of relapse upon discharge and the need for readmission.  5. Psycho-social education regarding relapse prevention and self care.  6. Health care follow up as needed for medical problems.  7. Review, reconcile, and reinstate any pertinent home medications for other health issues where appropriate. 8. Call for consults with hospitalist for any additional specialty patient care services as needed.  Observation Level/Precautions:  15 minute checks  Laboratory:  Per ED, UDS positive for Benzodiazepine & Cocaine, BAL 161  Psychotherapy:  Group sessions  Medications: See H&P  Consultations: As needed  Discharge Concerns:  Safety, mood stabilization.  Estimated LOS: 3-5 days  Other: Admit to [facility].    Physician Treatment Plan for Primary Diagnosis: Will initiate medication management for mood stability. Set up an outpatient psychiatric services for medication management. Will encourage medication adherence with psychiatric medications.   Long Term Goal(s): Improvement in symptoms so as ready for discharge  Short Term Goals: Ability to verbalize feelings will improve, Ability to disclose and discuss suicidal ideas and Ability to demonstrate self-control will improve  Physician Treatment Plan for Secondary Diagnosis: Active Problems:   Major depressive disorder, recurrent severe without psychotic features ([place])  Long Term Goal(s): Improvement in symptoms so as ready for discharge  Short Term Goals: Ability to identify changes in lifestyle to reduce recurrence of condition will improve, Ability to demonstrate self-control will improve, Ability to identify and develop effective coping behaviors will improve and Ability to identify triggers associated with substance abuse/mental health issues will improve  I certify that inpatient services furnished can reasonably be expected to improve the patient's condition.    Encarnacion Slates, NP, PMHNP, FNP-BC 5/25/20184:33 PM  I have reviewed case with NP and have met with patient Agree with NP Note and Assessment  46 year old divorced male, has two children, currently unemployed, currently homeless . 46 year old man, presented to ED voluntarily requesting detoxification and reporting depression, suicidal ideations.  Reports history of substance abuse, has been drinking daily, up to pints of liquor daily. Also has been abusing crack cocaine and  heroin ( IVDA and insufflated ) . He has also occasionally been using Xanax, but not consistently. Admission BAL 161, admission UDS positive of  BZDs and Cocaine . States that due to his substance abuse, he was fired from his job last week. This in turn resulted in eviction due to inability to pay rent. Also  has  an upcoming DUI related court date. Denies history of alcohol related seizures or DTs.  Denies medical illnesses .  In the past had been prescribed Neurontin, Remeron, Naltrexone, but has not been taking for several months .  States he remembers Remeron as particularly helpful. Currently endorsing some cramps, nausea, but no vomiting. Feels jittery but no gross distal tremors or diaphoresis at this time. Dx- Alcohol, Opiate Dependencies, Substance Induced Mood Disorder, Depressed  Plan- inpatient admission, detox protocols to minimize risk of alcohol or opiate withdrawal. Start Remeron 7.5 mgrs QHS for depression and insomnia.

## 2017-02-12 MED ORDER — MIRTAZAPINE 15 MG PO TABS
15.0000 mg | ORAL_TABLET | Freq: Every day | ORAL | Status: DC
  Administered 2017-02-12 – 2017-02-21 (×10): 15 mg via ORAL
  Filled 2017-02-12 (×2): qty 1
  Filled 2017-02-12: qty 21
  Filled 2017-02-12 (×10): qty 1

## 2017-02-12 NOTE — Progress Notes (Signed)
D) Pt has done well today. Has attended the program and is not having any significant withdrawal symptoms. Pt however, is still quite depressed rating his depression as an 8, hopelessness as an 8 and his anxiety as an 8. Admits to thoughts of SI,, but contracts for safety. A) Given support, reassurance and praise. Encouragement and praise given. Provided with a 1:1.  R) Admits to SI. Contracts for his safety within the hospital.

## 2017-02-12 NOTE — Progress Notes (Signed)
[facility]  02/12/2017 2:54 PM Daniel BiddingRonnie Hane  MRN:  [id number] Subjective:  "I feel pretty good today in terms of not having any withdrawal symptoms. Terrible sleep last night though."  Objective: Pt seen and chart reviewed. Pt is alert/oriented x4, calm, cooperative, and appropriate to situation. Pt denies suicidal/homicidal ideation and psychosis and does not appear to be responding to internal stimuli. Pt denies any withdrawal symptoms at this time but does report very poor sleep.   Principal Problem: Alcohol dependence with alcohol-induced mood disorder (HCC) Diagnosis:   Patient Active Problem List   Diagnosis Date Noted  . Alcohol dependence with alcohol-induced mood disorder (HCC) [F10.24]     Priority: High  . Major depressive disorder, recurrent (HCC) [F33.9] 07/17/2016    Priority: High  . Major depressive disorder, recurrent severe without psychotic features (HCC) [F33.2] 12/14/2013    Priority: High  . Alcohol-induced mood disorder (HCC) [F10.94] 06/10/2016  . Cocaine dependence (HCC) [F14.20] 04/23/2014  . Substance induced mood disorder (HCC) [F19.94] 04/23/2014  . MDD (major depressive disorder) (HCC) [F32.9] 04/22/2014  . Alcohol use disorder, severe, dependence (HCC) [F10.20] 12/14/2013  . Adult ADHD [F90.9] 12/14/2013  . Polysubstance abuse [F19.10] 12/13/2013  . Alcohol withdrawal (HCC) [F10.239] 10/28/2013   Total Time spent with patient: 30 minutes  Past Psychiatric History: see H&P  Past Medical History:  Past Medical History:  Diagnosis Date  . ADHD, adult residual type   . Anxiety   . Anxiety and depression   . Bipolar disorder (HCC)   . Depression   . Gastroesophageal reflux disease   . Knee pain   . Polysubstance abuse     Past Surgical History:  Procedure Laterality Date  . TONSILLECTOMY     Family History: History reviewed. No pertinent family history. Family Psychiatric  History: see H&P Social History:  History  Alcohol Use   . Yes    Comment: in rehab      History  Drug Use  . Types: Heroin, Marijuana    Comment: in rehab    Social History   Social History  . Marital status: Divorced    Spouse name: N/A  . Number of children: N/A  . Years of education: N/A   Social History Main Topics  . Smoking status: Former Smoker    Packs/day: 1.00  . Smokeless tobacco: Never Used  . Alcohol use Yes     Comment: in rehab   . Drug use: Yes    Types: Heroin, Marijuana     Comment: in rehab  . Sexual activity: Not Asked   Other Topics Concern  . None   Social History Narrative  . None   Additional Social History:                         Sleep: Poor  Appetite:  Fair  Current Medications: Current Facility-Administered Medications  Medication Dose Route Frequency Provider Last Rate Last Dose  . acetaminophen (TYLENOL) tablet 650 mg  650 mg Oral Q6H PRN Charm RingsLord, Jamison Y, NP      . alum & mag hydroxide-simeth (MAALOX/MYLANTA) 200-200-20 MG/5ML suspension 30 mL  30 mL Oral Q4H PRN Charm RingsLord, Jamison Y, NP      . cloNIDine (CATAPRES) tablet 0.1 mg  0.1 mg Oral QID Charm RingsLord, Jamison Y, NP   0.1 mg at 02/12/17 1201   Followed by  . [START ON 02/13/2017] cloNIDine (CATAPRES) tablet 0.1 mg  0.1 mg Oral BH-qamhs Lord,  Herminio Heads, NP       Followed by  . [START ON 02/15/2017] cloNIDine (CATAPRES) tablet 0.1 mg  0.1 mg Oral QAC breakfast Charm Rings, NP      . dicyclomine (BENTYL) tablet 20 mg  20 mg Oral Q6H PRN Charm Rings, NP   20 mg at 02/11/17 1313  . folic acid (FOLVITE) tablet 1 mg  1 mg Oral Daily Charm Rings, NP   1 mg at 02/12/17 0915  . hydrOXYzine (ATARAX/VISTARIL) tablet 25 mg  25 mg Oral Q6H PRN Charm Rings, NP   25 mg at 02/11/17 2246  . loperamide (IMODIUM) capsule 2-4 mg  2-4 mg Oral PRN Charm Rings, NP      . LORazepam (ATIVAN) tablet 1 mg  1 mg Oral Q6H PRN Armandina Stammer I, NP      . LORazepam (ATIVAN) tablet 1 mg  1 mg Oral TID Armandina Stammer I, NP   1 mg at 02/12/17 1201    Followed by  . [START ON 02/13/2017] LORazepam (ATIVAN) tablet 1 mg  1 mg Oral BID Armandina Stammer I, NP       Followed by  . [START ON 02/14/2017] LORazepam (ATIVAN) tablet 1 mg  1 mg Oral Daily Nwoko, Agnes I, NP      . magnesium hydroxide (MILK OF MAGNESIA) suspension 30 mL  30 mL Oral Daily PRN Charm Rings, NP      . methocarbamol (ROBAXIN) tablet 500 mg  500 mg Oral Q8H PRN Charm Rings, NP   500 mg at 02/11/17 2246  . mirtazapine (REMERON) tablet 15 mg  15 mg Oral QHS Aydian Dimmick C, FNP      . multivitamin with minerals tablet 1 tablet  1 tablet Oral Daily Nwoko, Agnes I, NP   1 tablet at 02/12/17 0915  . naproxen (NAPROSYN) tablet 500 mg  500 mg Oral BID PRN Charm Rings, NP   500 mg at 02/11/17 1012  . nicotine (NICODERM CQ - dosed in mg/24 hours) patch 21 mg  21 mg Transdermal Daily Nira Conn A, NP   21 mg at 02/12/17 0916  . ondansetron (ZOFRAN-ODT) disintegrating tablet 4 mg  4 mg Oral Q6H PRN Nwoko, Agnes I, NP      . thiamine (VITAMIN B-1) tablet 100 mg  100 mg Oral Daily Charm Rings, NP   100 mg at 02/12/17 1610   Or  . thiamine (B-1) injection 100 mg  100 mg Intravenous Daily Charm Rings, NP        Lab Results: No results found for this or any previous visit (from the past 48 hour(s)).  Blood Alcohol level:  Lab Results  Component Value Date   ETH 161 (H) 02/10/2017   ETH 202 (H) 07/17/2016    Metabolic Disorder Labs: Lab Results  Component Value Date   HGBA1C 4.9 12/14/2013   MPG 94 12/14/2013   No results found for: PROLACTIN No results found for: CHOL, TRIG, HDL, CHOLHDL, VLDL, LDLCALC  Physical Findings: AIMS: Facial and Oral Movements Muscles of Facial Expression: None, normal Lips and Perioral Area: None, normal Jaw: None, normal Tongue: None, normal,Extremity Movements Upper (arms, wrists, hands, fingers): None, normal Lower (legs, knees, ankles, toes): None, normal, Trunk Movements Neck, shoulders, hips: None, normal, Overall  Severity Severity of abnormal movements (highest score from questions above): None, normal Incapacitation due to abnormal movements: None, normal Patient's awareness of abnormal movements (rate only patient's report): No  Awareness, Dental Status Current problems with teeth and/or dentures?: No Does patient usually wear dentures?: No  CIWA:  CIWA-Ar Total: 4 COWS:  COWS Total Score: 3  Musculoskeletal: Strength & Muscle Tone: within normal limits Gait & Station: normal Patient leans: N/A  Psychiatric Specialty Exam: Physical Exam  Review of Systems  Psychiatric/Behavioral: Positive for depression and substance abuse. Negative for hallucinations and suicidal ideas. The patient is nervous/anxious and has insomnia.   All other systems reviewed and are negative.   Blood pressure (!) 133/93, pulse 79, temperature 97.6 F (36.4 C), temperature source Oral, resp. rate 18, height 6' (1.829 m), weight 79.4 kg (175 lb).Body mass index is 23.73 kg/m.  General Appearance: Casual and Fairly Groomed  Eye Contact:  Fair  Speech:  Clear and Coherent and Normal Rate  Volume:  Normal  Mood:  Anxious and Depressed  Affect:  Appropriate, Congruent and Depressed  Thought Process:  Coherent, Goal Directed, Linear and Descriptions of Associations: Intact  Orientation:  Full (Time, Place, and Person)  Thought Content:  Focused on treatment options, very poor sleep  Suicidal Thoughts:  No  Homicidal Thoughts:  No  Memory:  Immediate;   Fair Recent;   Fair Remote;   Fair  Judgement:  Fair  Insight:  Fair  Psychomotor Activity:  Normal  Concentration:  Concentration: Fair and Attention Span: Fair  Recall:  [employer] of Knowledge:  Fair  Language:  Fair  Akathisia:  No  Handed:    AIMS (if indicated):     Assets:  Communication Skills Desire for Improvement Resilience Social Support  ADL's:  Intact  Cognition:  WNL  Sleep:  Number of Hours: 6.5   Treatment Plan Summary: Alcohol dependence  with alcohol-induced mood disorder (HCC) with MDD, recurrent, severe, without psychosis, both unstable, managed as below:  Medications:  -Increase Remeron to 15mg  po qhs for MDD, insomnia (was 7.5) -Continue Nicotine patch -Continue CIWA / Ativan detox protocol  -Continue COWS /clonidine detox protocol due to heroin abuse (although flagged negative on UDS for opiates)  Beau Fanny, FNP 02/12/2017, 2:54 PM

## 2017-02-12 NOTE — Progress Notes (Signed)
[facility] Group Notes:  (Nursing/MHT/Case Management/Adjunct)  Date:  02/12/2017  Time:  11:44 PM  Type of Therapy:  Psychoeducational Skills  Participation Level:  Active  Participation Quality:  Appropriate  Affect:  Appropriate  Cognitive:  Appropriate  Insight:  Good  Engagement in Group:  Engaged  Modes of Intervention:  Education  Summary of Progress/Problems: The patient shared with the group that he had a good day since his peers were very supportive of him. He admits to thinking about issues that led to his admission in that he used to "self medicate" to deal with his life. He is interested in attending substance abuse meetings. In terms of the theme for the day, his coping skill will be to talk to people.   Daniel Arroyo, Daniel Arroyo 02/12/2017, 11:44 PM

## 2017-02-12 NOTE — [facility] Group Notes (Signed)
[facility] Group Notes: (Clinical Social Work)   02/12/2017      Type of Therapy:  Group Therapy   Participation Level:  Did Not Attend despite MHT prompting   Ambrose MantleMareida Grossman-Orr, LCSW 02/12/2017, 11:21 AM

## 2017-02-12 NOTE — Progress Notes (Signed)
D.  Pt pleasant on approach, mild withdrawal symptoms present.  Pt was positive for evening wrap up group, interacting appropriately with peers on the unit.  Pt denies SI/HI/AVH at this time.  A.  Support and encouragement offered, medication given as ordered.  R.  Pt remains safe on the unit, will continue to monitor.

## 2017-02-12 NOTE — Plan of Care (Signed)
Problem: Safety: Goal: Periods of time without injury will increase Outcome: Progressing Pt has not harmed self or others tonight.  He denies SI/HI and verbally contracts for safety.   

## 2017-02-12 NOTE — Progress Notes (Signed)
D: Pt was in the dayroom upon initial approach.  Pt presents with anxious, depressed affect and mood.  His goal is "to make it through today."  Pt reports the best part of his day was "receiving a phone call I wasn't expecting."  Pt denies SI/HI, denies hallucinations, reports pain from muscle spasms of 6/10, reports withdrawal symptoms of "shakes, feeling hot and cold."  Pt has been visible in milieu interacting with peers and staff appropriately.  Pt attended evening group.    A: Introduced self to pt.  Actively listened to pt and offered support and encouragement. Medications administered per order.  PRN medication administered for muscle spasms and anxiety.  Q15 minute safety checks maintained.  R: Pt is safe on the unit.  Pt is compliant with medications.  Pt verbally contracts for safety.  Will continue to monitor and assess.

## 2017-02-13 DIAGNOSIS — F17210 Nicotine dependence, cigarettes, uncomplicated: Secondary | ICD-10-CM

## 2017-02-13 DIAGNOSIS — F1190 Opioid use, unspecified, uncomplicated: Secondary | ICD-10-CM

## 2017-02-13 MED ORDER — DICYCLOMINE HCL 20 MG PO TABS: 20.0000 mg | ORAL_TABLET | Freq: Three times a day (TID) | ORAL | Status: DC

## 2017-02-13 NOTE — Progress Notes (Signed)
[facility] MD Progress Note  02/13/2017 10:10 AM Daniel Arroyo  MRN:  [id number]   Subjective:  Patient seen resting in bed with complaints of abdominal pain from heroin detox. Reports hx of detox states the pain feels the same. Patient is requesting to speak to social worker for information with inpatient treatment program with [facility].  Objective: Daniel Arroyo is awake, alert and oriented *3.  Denies suicidal or homicidal ideation. Denies auditory or visual hallucination and does not appear to be responding to internal stimuli. Patient reports he is medication compliant without mediation side effects. Patient reports a fair appetite and states he is resting "okay". Support, encouragement and reassurance was provided.   Principal Problem: Alcohol dependence with alcohol-induced mood disorder (HCC) Diagnosis:   Patient Active Problem List   Diagnosis Date Noted  . Alcohol dependence with alcohol-induced mood disorder (HCC) [F10.24]   . Major depressive disorder, recurrent (HCC) [F33.9] 07/17/2016  . Alcohol-induced mood disorder (HCC) [F10.94] 06/10/2016  . Cocaine dependence (HCC) [F14.20] 04/23/2014  . Substance induced mood disorder (HCC) [F19.94] 04/23/2014  . MDD (major depressive disorder) (HCC) [F32.9] 04/22/2014  . Alcohol use disorder, severe, dependence (HCC) [F10.20] 12/14/2013  . Major depressive disorder, recurrent severe without psychotic features (HCC) [F33.2] 12/14/2013  . Adult ADHD [F90.9] 12/14/2013  . Polysubstance abuse [F19.10] 12/13/2013  . Alcohol withdrawal (HCC) [F10.239] 10/28/2013   Total Time spent with patient: 30 minutes  Past Psychiatric History: see H&P  Past Medical History:  Past Medical History:  Diagnosis Date  . ADHD, adult residual type   . Anxiety   . Anxiety and depression   . Bipolar disorder (HCC)   . Depression   . Gastroesophageal reflux disease   . Knee pain   . Polysubstance abuse     Past Surgical History:  Procedure Laterality Date   . TONSILLECTOMY     Family History: History reviewed. No pertinent family history. Family Psychiatric  History: see H&P Social History:  History  Alcohol Use  . Yes    Comment: in rehab      History  Drug Use  . Types: Heroin, Marijuana    Comment: in rehab    Social History   Social History  . Marital status: Divorced    Spouse name: N/A  . Number of children: N/A  . Years of education: N/A   Social History Main Topics  . Smoking status: Former Smoker    Packs/day: 1.00  . Smokeless tobacco: Never Used  . Alcohol use Yes     Comment: in rehab   . Drug use: Yes    Types: Heroin, Marijuana     Comment: in rehab  . Sexual activity: Not Asked   Other Topics Concern  . None   Social History Narrative  . None   Additional Social History:                         Sleep: Poor  Appetite:  Fair  Current Medications: Current Facility-Administered Medications  Medication Dose Route Frequency Provider Last Rate Last Dose  . acetaminophen (TYLENOL) tablet 650 mg  650 mg Oral Q6H PRN Charm Rings, NP      . alum & mag hydroxide-simeth (MAALOX/MYLANTA) 200-200-20 MG/5ML suspension 30 mL  30 mL Oral Q4H PRN Charm Rings, NP      . cloNIDine (CATAPRES) tablet 0.1 mg  0.1 mg Oral BH-qamhs Charm Rings, NP   0.1 mg at 02/13/17 0830  Followed by  . [START ON 02/15/2017] cloNIDine (CATAPRES) tablet 0.1 mg  0.1 mg Oral QAC breakfast Charm Rings, NP      . dicyclomine (BENTYL) tablet 20 mg  20 mg Oral Q6H PRN Oneta Rack, NP   20 mg at 02/13/17 0855  . dicyclomine (BENTYL) tablet 20 mg  20 mg Oral TID Cobos, Rockey Situ, MD      . folic acid (FOLVITE) tablet 1 mg  1 mg Oral Daily Charm Rings, NP   1 mg at 02/13/17 0830  . hydrOXYzine (ATARAX/VISTARIL) tablet 25 mg  25 mg Oral Q6H PRN Charm Rings, NP   25 mg at 02/12/17 2154  . loperamide (IMODIUM) capsule 2-4 mg  2-4 mg Oral PRN Charm Rings, NP      . LORazepam (ATIVAN) tablet 1 mg  1 mg  Oral Q6H PRN Armandina Stammer I, NP      . LORazepam (ATIVAN) tablet 1 mg  1 mg Oral BID Armandina Stammer I, NP   1 mg at 02/13/17 5284   Followed by  . [START ON 02/14/2017] LORazepam (ATIVAN) tablet 1 mg  1 mg Oral Daily Nwoko, Agnes I, NP      . magnesium hydroxide (MILK OF MAGNESIA) suspension 30 mL  30 mL Oral Daily PRN Charm Rings, NP      . methocarbamol (ROBAXIN) tablet 500 mg  500 mg Oral Q8H PRN Charm Rings, NP   500 mg at 02/12/17 2154  . mirtazapine (REMERON) tablet 15 mg  15 mg Oral QHS Beau Fanny, FNP   15 mg at 02/12/17 2154  . multivitamin with minerals tablet 1 tablet  1 tablet Oral Daily Nwoko, Agnes I, NP   1 tablet at 02/13/17 0830  . naproxen (NAPROSYN) tablet 500 mg  500 mg Oral BID PRN Charm Rings, NP   500 mg at 02/11/17 1012  . nicotine (NICODERM CQ - dosed in mg/24 hours) patch 21 mg  21 mg Transdermal Daily Nira Conn A, NP   21 mg at 02/13/17 1324  . ondansetron (ZOFRAN-ODT) disintegrating tablet 4 mg  4 mg Oral Q6H PRN Nwoko, Agnes I, NP      . thiamine (VITAMIN B-1) tablet 100 mg  100 mg Oral Daily Charm Rings, NP   100 mg at 02/13/17 0830   Or  . thiamine (B-1) injection 100 mg  100 mg Intravenous Daily Charm Rings, NP        Lab Results: No results found for this or any previous visit (from the past 48 hour(s)).  Blood Alcohol level:  Lab Results  Component Value Date   ETH 161 (H) 02/10/2017   ETH 202 (H) 07/17/2016    Metabolic Disorder Labs: Lab Results  Component Value Date   HGBA1C 4.9 12/14/2013   MPG 94 12/14/2013   No results found for: PROLACTIN No results found for: CHOL, TRIG, HDL, CHOLHDL, VLDL, LDLCALC  Physical Findings: AIMS: Facial and Oral Movements Muscles of Facial Expression: None, normal Lips and Perioral Area: None, normal Jaw: None, normal Tongue: None, normal,Extremity Movements Upper (arms, wrists, hands, fingers): None, normal Lower (legs, knees, ankles, toes): None, normal, Trunk Movements Neck,  shoulders, hips: None, normal, Overall Severity Severity of abnormal movements (highest score from questions above): None, normal Incapacitation due to abnormal movements: None, normal Patient's awareness of abnormal movements (rate only patient's report): No Awareness, Dental Status Current problems with teeth and/or dentures?: No Does patient usually  wear dentures?: No  CIWA:  CIWA-Ar Total: 2 COWS:  COWS Total Score: 3  Musculoskeletal: Strength & Muscle Tone: within normal limits Gait & Station: normal Patient leans: N/A  Psychiatric Specialty Exam: Physical Exam  Nursing note and vitals reviewed. Constitutional: He is oriented to person, place, and time.  Neurological: He is alert and oriented to person, place, and time.  Psychiatric: He has a normal mood and affect. His behavior is normal.    Review of Systems  Gastrointestinal: Positive for nausea.  Musculoskeletal: Positive for myalgias.  Psychiatric/Behavioral: Positive for depression and substance abuse. Negative for hallucinations and suicidal ideas. The patient is nervous/anxious and has insomnia.   All other systems reviewed and are negative.   Blood pressure (!) 144/99, pulse 61, temperature 98.3 F (36.8 C), temperature source Oral, resp. rate 18, height 6' (1.829 m), weight 79.4 kg (175 lb).Body mass index is 23.73 kg/m.  General Appearance: Casual  Eye Contact:  Fair  Speech:  Clear and Coherent  Volume:  Normal  Mood:  Anxious, Depressed and Dysphoric  Affect:  Depressed and Flat  Thought Process:  Coherent  Orientation:  Full (Time, Place, and Person)  Thought Content: Continues to  focused on treatment options  Suicidal Thoughts:  No  Homicidal Thoughts:  No  Memory:  Immediate;   Fair Recent;   Fair Remote;   Fair  Judgement:  Fair  Insight:  Fair  Psychomotor Activity:  Normal  Concentration:  Concentration: Fair and Attention Span: Fair  Recall:  [employer]  Fund of Knowledge:  Fair  Language:  Fair   Akathisia:  No  Handed:    AIMS (if indicated):     Assets:  Desire for Improvement Resilience Social Support  ADL's:  Intact  Cognition:  WNL  Sleep:  Number of Hours: 6.5    I agree with current treatment plan on 05/272018, Patient seen face-to-face for psychiatric evaluation follow-up, chart reviewed. Reviewed the information documented and agree with the treatment plan.   Treatment Plan Summary: Alcohol dependence with alcohol-induced mood disorder (HCC) with MDD, recurrent, severe, without psychosis, both unstable, managed as below:  Continue with current treatment plan on 02/13/2017 listed below except where noted  Medications:  - Continue  Remeron to 15 mg po qhs for MDD, insomnia -Continue Nicotine patch -Continue CIWA / Ativan detox protocol  -Continue COWS /clonidine detox protocol due to heroin abuse (although flagged negative on UDS for opiates)  Will continue to monitor vitals ,medication compliance and treatment side effects while patient is here.  Reviewed labs Glucose 101 elevated ,BAL - 198, UDS - pos for cocaineand benzodizpines. CSW will start working on disposition.  Patient to participate in therapeutic milieu  Oneta Rackanika N Albert Hersch, NP 02/13/2017, 10:10 AM

## 2017-02-13 NOTE — [facility] Group Notes (Addendum)
Goals Group  Date:  02/13/2017  Time:  0930  Type of Therapy:  Goals group: The group is centered around teaching our patients how to set attainable goals and the how to go about achieveing them, in order to maintain their recovery.  Participation Level: Pt did not attend.  Participation Quality:    Affect:    Cognitive:    Insight:    Engagement in Group:    Modes of Intervention:    Summary of Progress/Problems:  Rich BraveDuke, Lumir Demetriou Lynn 02/13/2017, 12:57 PM

## 2017-02-13 NOTE — [facility] Group Notes (Signed)
[facility] Group Notes:  (Clinical Social Work)   02/13/2017    11:00AM-12:00PM  Summary of Progress/Problems:   The main focus of today's process group was to   1)  discuss the importance of adding supports  2  Identify the patient's current healthy supports and plan what to add.  An emphasis was placed on using counselor, doctor, therapy groups, 12-step groups, and problem-specific support groups to expand supports.    The patient expressed full comprehension of the concepts presented, and agreed that there is a need to add more supports.  The patient stated he has no current supports in his life, and [facility]  [facility] so [place], he states he does not want to try that again.  Type of Therapy:  Process Group with Motivational Interviewing  Participation Level:  Active  Participation Quality:  Attentive  Affect:  Blunted  Cognitive:  Appropriate  Insight:  Developing/Improving  Engagement in Therapy:  Engaged  Modes of Intervention:   Education, Support and Processing  Ambrose MantleMareida Grossman-Orr, LCSW 02/13/2017    12:50 PM

## 2017-02-13 NOTE — Progress Notes (Signed)
Daniel Arroyo remains sad, quiet, flat and depressed. HE is unshaven. He avoids eye contact with this Clinical research associatewriter. HE c/o stomach cramps immediately after returning to the unit from breakfast and he was given bentyl prn per MD order. He stated " a little better" relief one hour later when asked by this Clinical research associatewriter. He says " I'm just getting through it" when asked about his detox and says he is unsure where he wants [facility]. R Safety is in place.

## 2017-02-14 MED ORDER — QUETIAPINE FUMARATE 25 MG PO TABS: 25.0000 mg | ORAL_TABLET | Freq: Two times a day (BID) | ORAL | Status: DC | PRN

## 2017-02-14 NOTE — Progress Notes (Signed)
Pt has been in the dayroom all evening, watching TV and talking with peers.  He reports that he is having some mild to moderate withdrawal symptoms.  He denies SI/HI/AVH at this time.  His main concern for the evening was to speak with the CSW first thing in the morning with some questions about his upcoming court dates.  Writer told pt that she would put in a treatment note in the chart and also inform the oncoming RN in the morning.  Pt seemed relieved.  He voiced no other needs or concerns.  Support and encouragement offered.  Discharge plans are in process.  [facility].  Safety maintained with q15 minute checks.

## 2017-02-14 NOTE — Progress Notes (Signed)
Per pt request, CSW sent a letter stating [facility] [address]  Jonathon JordanLynn B Iveliz Garay, MSW, Theresia MajorsLCSWA [phone]

## 2017-02-14 NOTE — Progress Notes (Signed)
Patient ID: Daniel Arroyo, male   DOB: [birth year], 46 y.o.   MRN: [id number]  DAR: Pt. Denies SI/HI and A/V Hallucinations. He reports sleep is fair, appetite is fair, energy level is low, and concentration is poor. He rates depression, hopelessness, and anxiety 7/10. Patient does report some abdominal cramping and muscles spasms which were relieved by PRN medication. Support and encouragement provided to the patient. Scheduled medications administered to patient per physician's orders. Patient reports his withdrawal symptoms are getting better however he continues to report some tremors, cravings, cramping, and irritability. Patient is cooperative with Clinical research associatewriter but minimal and forwards little. Q15 minute checks are maintained for safety.

## 2017-02-14 NOTE — [facility] Group Notes (Signed)
[facility] LCSW Aftercare Discharge Planning Group Note   02/14/2017  8:45 AM  Participation Quality:  Pt invited. Did not attend.  Leanza Shepperson B Gevorg Brum, MSW, LCSWA 02/14/2017 1:09 PM '  

## 2017-02-14 NOTE — Progress Notes (Signed)
[facility] MD Progress Note  02/14/2017 2:16 PM Daniel Arroyo  MRN:  [id number]   Subjective:  Patient continues to endorse mild detox symptoms. States his abdominal pain has improved since yesterday.  Objective: Daniel Arroyo is awake, alert and oriented *3.  Denies suicidal or homicidal ideation during this assessment. Patient reports he as attended a few group session and is encouraged more that ever to staying "clean." reports he is interested in going to [facility] long term.  Patient report reading which is a good coping skill for him. Denies auditory or visual hallucination and does not appear to be responding to internal stimuli. Patient reports he is medication compliant without mediation side effects. Patient reports a fair appetite and states he is resting okay. Support, encouragement and reassurance was provided.   Principal Problem: Alcohol dependence with alcohol-induced mood disorder (HCC) Diagnosis:   Patient Active Problem List   Diagnosis Date Noted  . Alcohol dependence with alcohol-induced mood disorder (HCC) [F10.24]   . Major depressive disorder, recurrent (HCC) [F33.9] 07/17/2016  . Alcohol-induced mood disorder (HCC) [F10.94] 06/10/2016  . Cocaine dependence (HCC) [F14.20] 04/23/2014  . Substance induced mood disorder (HCC) [F19.94] 04/23/2014  . MDD (major depressive disorder) (HCC) [F32.9] 04/22/2014  . Alcohol use disorder, severe, dependence (HCC) [F10.20] 12/14/2013  . Major depressive disorder, recurrent severe without psychotic features (HCC) [F33.2] 12/14/2013  . Adult ADHD [F90.9] 12/14/2013  . Polysubstance abuse [F19.10] 12/13/2013  . Alcohol withdrawal (HCC) [F10.239] 10/28/2013   Total Time spent with patient: 30 minutes  Past Psychiatric History: see H&P  Past Medical History:  Past Medical History:  Diagnosis Date  . ADHD, adult residual type   . Anxiety   . Anxiety and depression   . Bipolar disorder (HCC)   . Depression   . Gastroesophageal reflux  disease   . Knee pain   . Polysubstance abuse     Past Surgical History:  Procedure Laterality Date  . TONSILLECTOMY     Family History: History reviewed. No pertinent family history. Family Psychiatric  History: see H&P Social History:  History  Alcohol Use  . Yes    Comment: in rehab      History  Drug Use  . Types: Heroin, Marijuana    Comment: in rehab    Social History   Social History  . Marital status: Divorced    Spouse name: N/A  . Number of children: N/A  . Years of education: N/A   Social History Main Topics  . Smoking status: Former Smoker    Packs/day: 1.00  . Smokeless tobacco: Never Used  . Alcohol use Yes     Comment: in rehab   . Drug use: Yes    Types: Heroin, Marijuana     Comment: in rehab  . Sexual activity: Not Asked   Other Topics Concern  . None   Social History Narrative  . None   Additional Social History:                         Sleep: Poor  Appetite:  Fair  Current Medications: Current Facility-Administered Medications  Medication Dose Route Frequency Provider Last Rate Last Dose  . acetaminophen (TYLENOL) tablet 650 mg  650 mg Oral Q6H PRN Charm Rings, NP      . alum & mag hydroxide-simeth (MAALOX/MYLANTA) 200-200-20 MG/5ML suspension 30 mL  30 mL Oral Q4H PRN Charm Rings, NP      . Melene Muller ON 02/15/2017]  cloNIDine (CATAPRES) tablet 0.1 mg  0.1 mg Oral QAC breakfast Charm Rings, NP      . dicyclomine (BENTYL) tablet 20 mg  20 mg Oral Q6H PRN Oneta Rack, NP   20 mg at 02/14/17 8295  . folic acid (FOLVITE) tablet 1 mg  1 mg Oral Daily Charm Rings, NP   1 mg at 02/14/17 6213  . hydrOXYzine (ATARAX/VISTARIL) tablet 25 mg  25 mg Oral Q6H PRN Charm Rings, NP   25 mg at 02/12/17 2154  . loperamide (IMODIUM) capsule 2-4 mg  2-4 mg Oral PRN Charm Rings, NP      . magnesium hydroxide (MILK OF MAGNESIA) suspension 30 mL  30 mL Oral Daily PRN Charm Rings, NP      . methocarbamol (ROBAXIN) tablet  500 mg  500 mg Oral Q8H PRN Charm Rings, NP   500 mg at 02/14/17 0865  . mirtazapine (REMERON) tablet 15 mg  15 mg Oral QHS Beau Fanny, FNP   15 mg at 02/13/17 2256  . multivitamin with minerals tablet 1 tablet  1 tablet Oral Daily Armandina Stammer I, NP   1 tablet at 02/14/17 0826  . naproxen (NAPROSYN) tablet 500 mg  500 mg Oral BID PRN Charm Rings, NP   500 mg at 02/11/17 1012  . nicotine (NICODERM CQ - dosed in mg/24 hours) patch 21 mg  21 mg Transdermal Daily Nira Conn A, NP   21 mg at 02/14/17 0826  . thiamine (VITAMIN B-1) tablet 100 mg  100 mg Oral Daily Charm Rings, NP   100 mg at 02/14/17 7846   Or  . thiamine (B-1) injection 100 mg  100 mg Intravenous Daily Charm Rings, NP        Lab Results: No results found for this or any previous visit (from the past 48 hour(s)).  Blood Alcohol level:  Lab Results  Component Value Date   ETH 161 (H) 02/10/2017   ETH 202 (H) 07/17/2016    Metabolic Disorder Labs: Lab Results  Component Value Date   HGBA1C 4.9 12/14/2013   MPG 94 12/14/2013   No results found for: PROLACTIN No results found for: CHOL, TRIG, HDL, CHOLHDL, VLDL, LDLCALC  Physical Findings: AIMS: Facial and Oral Movements Muscles of Facial Expression: None, normal Lips and Perioral Area: None, normal Jaw: None, normal Tongue: None, normal,Extremity Movements Upper (arms, wrists, hands, fingers): None, normal Lower (legs, knees, ankles, toes): None, normal, Trunk Movements Neck, shoulders, hips: None, normal, Overall Severity Severity of abnormal movements (highest score from questions above): None, normal Incapacitation due to abnormal movements: None, normal Patient's awareness of abnormal movements (rate only patient's report): No Awareness, Dental Status Current problems with teeth and/or dentures?: No Does patient usually wear dentures?: No  CIWA:  CIWA-Ar Total: 6 COWS:  COWS Total Score: 4  Musculoskeletal: Strength & Muscle Tone:  within normal limits Gait & Station: normal Patient leans: N/A  Psychiatric Specialty Exam: Physical Exam  Nursing note and vitals reviewed. Constitutional: He is oriented to person, place, and time. He appears well-developed.  Cardiovascular: Normal rate.   Neurological: He is alert and oriented to person, place, and time.  Psychiatric: He has a normal mood and affect. His behavior is normal.    Review of Systems  Gastrointestinal: Positive for nausea.  Musculoskeletal: Positive for myalgias.  Psychiatric/Behavioral: Positive for depression and substance abuse. Negative for hallucinations and suicidal ideas. The patient is nervous/anxious and  has insomnia.   All other systems reviewed and are negative.   Blood pressure 137/89, pulse 60, temperature 97.5 F (36.4 C), temperature source Oral, resp. rate 18, height 6' (1.829 m), weight 79.4 kg (175 lb).Body mass index is 23.73 kg/m.  General Appearance: Casual  Eye Contact:  Fair  Speech:  Clear and Coherent  Volume:  Normal  Mood:  Anxious, Depressed and Dysphoric  Affect:  Depressed and Flat  Thought Process:  Coherent  Orientation:  Full (Time, Place, and Person)  Thought Content: Continues to  focused on treatment options  Suicidal Thoughts:  No  Homicidal Thoughts:  No  Memory:  Immediate;   Fair Recent;   Fair Remote;   Fair  Judgement:  Fair  Insight:  Fair  Psychomotor Activity:  Normal  Concentration:  Concentration: Fair and Attention Span: Fair  Recall:  [employer]Fair  Fund of Knowledge:  Fair  Language:  Fair  Akathisia:  No  Handed:    AIMS (if indicated):     Assets:  Desire for Improvement Resilience Social Support  ADL's:  Intact  Cognition:  WNL  Sleep:  Number of Hours: 5.25    I agree with current treatment plan on 02/14/2017, Patient seen face-to-face for psychiatric evaluation follow-up, chart reviewed. Reviewed the information documented and agree with the treatment plan.   Treatment Plan  Summary: Alcohol dependence with alcohol-induced mood disorder (HCC) with MDD, recurrent, severe, without psychosis, both unstable, managed as below:  Continue with current treatment plan on 02/14/2017 listed below except where noted  Medications:   - Start Seroquel 25 mg PO PRN BID for mood stabilization  - Continue  Remeron to 15 mg po qhs for MDD, insomnia -Continue Nicotine patch -Continue CIWA / Ativan detox protocol  -Continue COWS /clonidine detox protocol due to heroin abuse (although flagged negative on UDS for opiates)  Will continue to monitor vitals ,medication compliance and treatment side effects while patient is here.  Reviewed labs,BAL - 161, UDS - pos for cocaineand benzodizpines. CSW will start working on disposition.  Patient to participate in therapeutic milieu  Oneta Rackanika N Lewis, NP 02/14/2017, 2:16 PM

## 2017-02-14 NOTE — Progress Notes (Signed)
Recreation Therapy Notes  Date: 02/14/17 Time: 0930 Location: 300 Hall Dayroom  Group Topic: Stress Management  Goal Area(s) Addresses:  Patient will verbalize importance of using healthy stress management.  Patient will identify positive emotions associated with healthy stress management.   Intervention: Stress Management  Activity :  Guided Imagery.  LRT introduced the stress management concept of guided imagery.  LRT read a script to allow patients to engage in guided imagery.  Patients were to follow along to fully participate in the activity.  Education:  Stress Management, Discharge Planning.   Education Outcome: Acknowledges edcuation/In group clarification offered/Needs additional education  Clinical Observations/Feedback: Pt did not attend group.   Ruhaan Nordahl, LRT/CTRS         Tirrell Buchberger A 02/14/2017 11:56 AM 

## 2017-02-14 NOTE — Progress Notes (Signed)
Per pt request, CSW sent referral to [facility] for residential treatment.  Tanasia Budzinski B Zafiro Routson, MSW, LCSWA [phone]  

## 2017-02-14 NOTE — Progress Notes (Signed)
[facility] Group Notes:  (Nursing/MHT/Case Management/Adjunct)  Date:  02/14/2017  Time:  1:24 AM  Type of Therapy:  Psychoeducational Skills  Participation Level:  Active  Participation Quality:  Attentive  Affect:  Appropriate  Cognitive:  Appropriate  Insight:  Appropriate  Engagement in Group:  Limited  Modes of Intervention:  Education  Summary of Progress/Problems: The patient expressed in group that he had a better day since he felt like "less of a roller coaster" than yesterday. His goal for tomorrow is to work on his discharge plans and to inquire about [facility].   Alletta Mattos S 02/14/2017, 1:24 AM

## 2017-02-15 DIAGNOSIS — F1024 Alcohol dependence with alcohol-induced mood disorder: Principal | ICD-10-CM

## 2017-02-15 DIAGNOSIS — G4700 Insomnia, unspecified: Secondary | ICD-10-CM

## 2017-02-15 DIAGNOSIS — Z87891 Personal history of nicotine dependence: Secondary | ICD-10-CM

## 2017-02-15 DIAGNOSIS — F1110 Opioid abuse, uncomplicated: Secondary | ICD-10-CM

## 2017-02-15 DIAGNOSIS — F1290 Cannabis use, unspecified, uncomplicated: Secondary | ICD-10-CM

## 2017-02-15 MED ORDER — QUETIAPINE FUMARATE 25 MG PO TABS
25.0000 mg | ORAL_TABLET | Freq: Two times a day (BID) | ORAL | Status: DC
  Administered 2017-02-15 – 2017-02-22 (×14): 25 mg via ORAL
  Filled 2017-02-15 (×8): qty 1
  Filled 2017-02-15: qty 84
  Filled 2017-02-15 (×8): qty 1
  Filled 2017-02-15: qty 84
  Filled 2017-02-15 (×4): qty 1

## 2017-02-15 MED ORDER — QUETIAPINE FUMARATE 50 MG PO TABS
50.0000 mg | ORAL_TABLET | Freq: Every day | ORAL | Status: DC
  Administered 2017-02-15 – 2017-02-21 (×7): 50 mg via ORAL
  Filled 2017-02-15 (×9): qty 1

## 2017-02-15 MED ORDER — CITALOPRAM HYDROBROMIDE 10 MG PO TABS
10.0000 mg | ORAL_TABLET | Freq: Every day | ORAL | Status: DC
  Administered 2017-02-15 – 2017-02-17 (×3): 10 mg via ORAL
  Filled 2017-02-15 (×6): qty 1

## 2017-02-15 NOTE — Progress Notes (Signed)
[facility] MD Progress Note  02/15/2017 6:06 PM Rayman Petrosian  MRN:  [id number]   Subjective:  Patient has irritability and periods of agitation today.  He is tolerating withdrawal protocol    Objective: Kyon Ditullio was admitted under context of polysubstance abuse  Seen today, he states that he is having frequent and intense racing thoughts  That keep him agitated during day and preventing sleep.  He is compliant on meds  He is interacting well with others   He denies withdrawal symptoms  Principal Problem: Alcohol dependence with alcohol-induced mood disorder (HCC) Diagnosis:   Patient Active Problem List   Diagnosis Date Noted  . Alcohol dependence with alcohol-induced mood disorder (HCC) [F10.24]   . Major depressive disorder, recurrent (HCC) [F33.9] 07/17/2016  . Alcohol-induced mood disorder (HCC) [F10.94] 06/10/2016  . Cocaine dependence (HCC) [F14.20] 04/23/2014  . Substance induced mood disorder (HCC) [F19.94] 04/23/2014  . MDD (major depressive disorder) (HCC) [F32.9] 04/22/2014  . Alcohol use disorder, severe, dependence (HCC) [F10.20] 12/14/2013  . Major depressive disorder, recurrent severe without psychotic features (HCC) [F33.2] 12/14/2013  . Adult ADHD [F90.9] 12/14/2013  . Polysubstance abuse [F19.10] 12/13/2013  . Alcohol withdrawal (HCC) [F10.239] 10/28/2013   Total Time spent with patient: 30 minutes  Past Psychiatric History: see H&P  Past Medical History:  Past Medical History:  Diagnosis Date  . ADHD, adult residual type   . Anxiety   . Anxiety and depression   . Bipolar disorder (HCC)   . Depression   . Gastroesophageal reflux disease   . Knee pain   . Polysubstance abuse     Past Surgical History:  Procedure Laterality Date  . TONSILLECTOMY     Family History: History reviewed. No pertinent family history. Family Psychiatric  History: see H&P Social History:  History  Alcohol Use  . Yes    Comment: in rehab      History  Drug Use  . Types:  Heroin, Marijuana    Comment: in rehab    Social History   Social History  . Marital status: Divorced    Spouse name: N/A  . Number of children: N/A  . Years of education: N/A   Social History Main Topics  . Smoking status: Former Smoker    Packs/day: 1.00  . Smokeless tobacco: Never Used  . Alcohol use Yes     Comment: in rehab   . Drug use: Yes    Types: Heroin, Marijuana     Comment: in rehab  . Sexual activity: Not Asked   Other Topics Concern  . None   Social History Narrative  . None   Additional Social History:                         Sleep: Poor  Appetite:  Fair  Current Medications: Current Facility-Administered Medications  Medication Dose Route Frequency Provider Last Rate Last Dose  . acetaminophen (TYLENOL) tablet 650 mg  650 mg Oral Q6H PRN Charm Rings, NP      . alum & mag hydroxide-simeth (MAALOX/MYLANTA) 200-200-20 MG/5ML suspension 30 mL  30 mL Oral Q4H PRN Charm Rings, NP      . dicyclomine (BENTYL) tablet 20 mg  20 mg Oral Q6H PRN Oneta Rack, NP   20 mg at 02/14/17 4098  . folic acid (FOLVITE) tablet 1 mg  1 mg Oral Daily Charm Rings, NP   1 mg at 02/15/17 1191  . hydrOXYzine (ATARAX/VISTARIL)  tablet 25 mg  25 mg Oral Q6H PRN Charm RingsLord, Jamison Y, NP   25 mg at 02/12/17 2154  . loperamide (IMODIUM) capsule 2-4 mg  2-4 mg Oral PRN Charm RingsLord, Jamison Y, NP      . magnesium hydroxide (MILK OF MAGNESIA) suspension 30 mL  30 mL Oral Daily PRN Charm RingsLord, Jamison Y, NP      . methocarbamol (ROBAXIN) tablet 500 mg  500 mg Oral Q8H PRN Charm RingsLord, Jamison Y, NP   500 mg at 02/14/17 [id number]  . mirtazapine (REMERON) tablet 15 mg  15 mg Oral QHS Withrow, [name], FNP   15 mg at 02/14/17 2246  . multivitamin with minerals tablet 1 tablet  1 tablet Oral Daily Armandina StammerNwoko, Agnes I, NP   1 tablet at 02/15/17 0824  . naproxen (NAPROSYN) tablet 500 mg  500 mg Oral BID PRN Charm RingsLord, Jamison Y, NP   500 mg at 02/11/17 1012  . nicotine (NICODERM CQ - dosed in mg/24 hours)  patch 21 mg  21 mg Transdermal Daily Nira ConnBerry, Jason A, NP   21 mg at 02/15/17 0826  . QUEtiapine (SEROQUEL) tablet 25 mg  25 mg Oral BID BM & HS PRN Oneta RackLewis, Tanika N, NP      . thiamine (VITAMIN B-1) tablet 100 mg  100 mg Oral Daily Charm RingsLord, Jamison Y, NP   100 mg at 02/15/17 [id number]   Or  . thiamine (B-1) injection 100 mg  100 mg Intravenous Daily Charm RingsLord, Jamison Y, NP        Lab Results: No results found for this or any previous visit (from the past 48 hour(s)).  Blood Alcohol level:  Lab Results  Component Value Date   ETH 161 (H) 02/10/2017   ETH 202 (H) 07/17/2016    Metabolic Disorder Labs: Lab Results  Component Value Date   HGBA1C 4.9 12/14/2013   MPG 94 12/14/2013   No results found for: PROLACTIN No results found for: CHOL, TRIG, HDL, CHOLHDL, VLDL, LDLCALC  Physical Findings: AIMS: Facial and Oral Movements Muscles of Facial Expression: None, normal Lips and Perioral Area: None, normal Jaw: None, normal Tongue: None, normal,Extremity Movements Upper (arms, wrists, hands, fingers): None, normal Lower (legs, knees, ankles, toes): None, normal, Trunk Movements Neck, shoulders, hips: None, normal, Overall Severity Severity of abnormal movements (highest score from questions above): None, normal Incapacitation due to abnormal movements: None, normal Patient's awareness of abnormal movements (rate only patient's report): No Awareness, Dental Status Current problems with teeth and/or dentures?: No Does patient usually wear dentures?: No  CIWA:  CIWA-Ar Total: 2 COWS:  COWS Total Score: 7  Musculoskeletal: Strength & Muscle Tone: within normal limits Gait & Station: normal Patient leans: N/A  Psychiatric Specialty Exam: Physical Exam  Nursing note and vitals reviewed. Constitutional: He is oriented to person, place, and time. He appears well-developed.  Cardiovascular: Normal rate.   Neurological: He is alert and oriented to person, place, and time.  Psychiatric: He has a  normal mood and affect. His behavior is normal.    Review of Systems  Gastrointestinal: Positive for nausea.  Musculoskeletal: Positive for myalgias.  Psychiatric/Behavioral: Positive for depression and substance abuse. Negative for hallucinations and suicidal ideas. The patient is nervous/anxious and has insomnia.   All other systems reviewed and are negative.   Blood pressure 120/82, pulse 61, temperature 97.6 F (36.4 C), temperature source Oral, resp. rate 16, height 6' (1.829 m), weight 79.4 kg (175 lb), SpO2 100 %.Body mass index is 23.73 kg/m.  General Appearance: Casual  Eye Contact:  Fair  Speech:  Clear and Coherent  Volume:  Normal  Mood:  Anxious, Depressed and Dysphoric  Affect:  Depressed and Flat  Thought Process:  Coherent  Orientation:  Full (Time, Place, and Person)  Thought Content: Continues to  focused on treatment options  Suicidal Thoughts:  No  Homicidal Thoughts:  No  Memory:  Immediate;   Fair Recent;   Fair Remote;   Fair  Judgement:  Fair  Insight:  Fair  Psychomotor Activity:  Normal  Concentration:  Concentration: Fair and Attention Span: Fair  Recall:  [employer] of Knowledge:  Fair  Language:  Fair  Akathisia:  No  Handed:    AIMS (if indicated):     Assets:  Desire for Improvement Resilience Social Support  ADL's:  Intact  Cognition:  WNL  Sleep:  Number of Hours: 5.5    Treatment Plan Summary:  Reviewed and will cont 02/15/2017 Alcohol dependence with alcohol-induced mood disorder (HCC) with MDD, recurrent, severe, without psychosis, both unstable, managed as below:  Medications:  - Cont Seroquel 25 mg from PRN dosing to  BID for mood stabilization.  Cont 50 mg for nightly dose -Will start Celexa 10 mg trial MDD - Continue  Remeron to 15 mg po qhs for MDD, insomnia -Continue Nicotine patch -Continue CIWA / Ativan detox protocol  -Continue COWS /clonidine detox protocol due to heroin abuse (although flagged negative on UDS for  opiates)  Will continue to monitor vitals ,medication compliance and treatment side effects while patient is here.  Reviewed labs,BAL - 161, UDS - pos for cocaineand benzodizpines. CSW will start working on disposition.  Patient to participate in therapeutic milieu  Lindwood Qua, NP [facility] 02/15/2017, 6:06 PM

## 2017-02-15 NOTE — Progress Notes (Signed)
D: Patient continues to have some withdrawal symptoms.  He reports cravings, agitation and irritability.  He reports poor sleep; appetite is fair; his energy is low and his concentration is poor.  He denies any thoughts of self harm.  He rates his depression and anxiety as a 7; hopelessness as a 6.  He denies any physical pain.  [facility].  His goal today is to "rest and work on discharge plan."  A: Continue to monitor medication management and MD orders.  Safety checks completed every 15 minutes per protocol.  Offer support and encouragement as needed. R: Patient is receptive to staff; his behavior is appropriate.

## 2017-02-15 NOTE — Progress Notes (Signed)
Pt reports he is feeling better today and that his withdrawal symptoms are decreasing.  He denies SI/HI/AVH.  He has been observed interacting more with other patients on the unit.  Pt has been pleasant and appropriate.  Pt makes his needs known to staff.  Support and encouragement offered.  Discharge plans are in process.  Safety maintained with q15 minute checks.

## 2017-02-15 NOTE — Progress Notes (Signed)
[facility] Group Notes:  (Nursing/MHT/Case Management/Adjunct)  Date:  02/15/2017  Time:  12:06 AM  Type of Therapy:  Psychoeducational Skills  Participation Level:  Active  Participation Quality:  Appropriate  Affect:  Appropriate  Cognitive:  Appropriate  Insight:  Improving  Engagement in Group:  Improving  Modes of Intervention:  Education  Summary of Progress/Problems: Patient states that he had a good day and that he felt less drowsy. He also expressed that he was able to meet with his case manager [facility]. His wellness strategy (theme for the day) will be to go back to the gym to exercise.   Daniel Arroyo S 02/15/2017, 12:06 AM

## 2017-02-15 NOTE — Progress Notes (Signed)
Recreation Therapy Notes  Animal-Assisted Activity (AAA) Program Checklist/Progress Notes Patient Eligibility Criteria Checklist & Daily Group note for Rec TxIntervention  Date: 02/15/2017 Time: 2:55pm Location: 400 hall dayroom   AAA/T Program Assumption of Risk Form signed by Patient/ or Parent Legal Guardian Yes  Patient is free of allergies or sever asthma Yes  Patient reports no fear of animals Yes  Patient reports no history of cruelty to animals Yes  Patient understands his/her participation is voluntary Yes  Patient washes hands before animal contact Yes  Patient washes hands after animal contact Yes  Behavioral Response: Appropriate  Education:Hand Washing, Appropriate Animal Interaction   Education Outcome: Acknowledges education.   Clinical Observations/Feedback: Patient attended session and interacted appropriately with therapy dog and peers.   Daniel Fullerachel Abrian Hanover, Recreational Therapy Intern       Daniel Arroyo 02/15/2017 3:22 PM

## 2017-02-15 NOTE — Progress Notes (Signed)
Nursing Progress Note: 7p-7a D: Pt currently presents with a pleasant/flat affect and behavior. Pt states "I am ready to go. Just waiting to hear back from [facility]." Interacting fair with milieu. Pt reports fair sleep with current medication regimen.   A: Pt provided with medications per providers orders. Pt's labs and vitals were monitored throughout the night. Pt supported emotionally and encouraged to express concerns and questions. Pt educated on medications.  R: Pt's safety ensured with 15 minute and environmental checks. Pt currently denies SI/HI/Self Harm and AVH. Pt verbally contracts to seek staff if SI/HI or A/VH occurs and to consult with staff before acting on any harmful thoughts. Will continue to monitor.

## 2017-02-15 NOTE — Plan of Care (Signed)
Problem: Health Behavior/Discharge Planning: Goal: Compliance with treatment plan for underlying cause of condition will improve Outcome: Progressing Patient has been compliant with treatment and he has completed his detox protocol.

## 2017-02-15 NOTE — Progress Notes (Signed)
Adult Psychoeducational Group Note  Date:  02/15/2017 Time:  10:38 PM  Group Topic/Focus:  Wrap-Up Group:   The focus of this group is to help patients review their daily goal of treatment and discuss progress on daily workbooks.  Participation Level:  Active  Participation Quality:  Appropriate and Attentive  Affect:  Appropriate  Cognitive:  Appropriate  Insight: Appropriate  Engagement in Group:  Engaged  Modes of Intervention:  Discussion  Additional Comments:  Pt stated his goal was to work on discharge plans. Pt stated he hit a road block, but is still progressing. Pt identified 2 coping skills, patience and tolerance.  Caswell CorwinOwen, Brenden Rudman C 02/15/2017, 10:38 PM

## 2017-02-15 NOTE — [facility] Group Notes (Signed)
[facility] LCSW Group Therapy  02/15/2017 1:15pm  Type of Therapy:  Group Therapy vercoming Obstacles  Participation Level:  Active  Participation Quality:  Appropriate   Affect:  Appropriate  Cognitive:  Appropriate and Oriented  Insight:  Developing/Improving and Improving  Engagement in Therapy:  Improving  Modes of Intervention:  Discussion, Exploration, Problem-solving and Support  Description of Group:   In this group patients will be encouraged to explore what they see as obstacles to their own wellness and recovery. They will be guided to discuss their thoughts, feelings, and behaviors related to these obstacles. The group will process together ways to cope with barriers, with attention given to specific choices patients can make. Each patient will be challenged to identify changes they are motivated to make in order to overcome their obstacles. This group will be process-oriented, with patients participating in exploration of their own experiences as well as giving and receiving support and challenge from other group members.  Summary of Patient Progress: Pt identified maintaining recovery as an obstacle for him. He reports that he knows how to get clean/well, but keeping up with it is the hard part. Pt expressed motivation to be engaged in recovery following rehab.   Therapeutic Modalities:   Cognitive Behavioral Therapy Solution Focused Therapy Motivational Interviewing Relapse Prevention Therapy   Vernie ShanksLauren Reeva Davern, LCSW 02/15/2017 4:22 PM

## 2017-02-16 MED ORDER — DICYCLOMINE HCL 20 MG PO TABS
20.0000 mg | ORAL_TABLET | Freq: Four times a day (QID) | ORAL | Status: AC | PRN
  Administered 2017-02-16 – 2017-02-20 (×4): 20 mg via ORAL
  Filled 2017-02-16 (×4): qty 1

## 2017-02-16 NOTE — Progress Notes (Signed)
Pt in dayroom at shift change.  Pt participates in group therapy.  Pt denies any pain or discomfort.  Pt currently denies SI, HI and AVH.  Pt does contract for safety. Pt does deny withdrawal symptoms of minor agitation.   Pt given PRN meds and explanation.  Pt remains cooperative and appropriate. Pt remains safe on unit.

## 2017-02-16 NOTE — Progress Notes (Signed)
Recreation Therapy Notes  Date: 02/16/17 Time: 0930 Location: [address] Dayroom  Group Topic: Stress Management  Goal Area(s) Addresses:  Patient will verbalize importance of using healthy stress management.  Patient will identify positive emotions associated with healthy stress management.   Intervention: Stress Management  Activity :  Body Scan Meditation.  LRT introduced the stress management technique of meditation.  LRT played a meditation to allow patients to take inventory of the sensations they are feeling in their body.  Patients were to follow along as the meditation was played to fully engage in the technique.  Education:  Stress Management, Discharge Planning.   Education Outcome: Acknowledges edcuation/In group clarification offered/Needs additional education  Clinical Observations/Feedback: Pt did not attend group.   Kaenan Jake, LRT/CTRS         Cristoval Teall A 02/16/2017 11:33 AM 

## 2017-02-16 NOTE — Progress Notes (Signed)
Adult Psychoeducational Group Note  Date:  02/16/2017 Time:  9:26 PM  Group Topic/Focus:  Wrap-Up Group:   The focus of this group is to help patients review their daily goal of treatment and discuss progress on daily workbooks.  Participation Level:  Active  Participation Quality:  Appropriate and Attentive  Affect:  Appropriate  Cognitive:  Appropriate  Insight: Appropriate  Engagement in Group:  Engaged  Modes of Intervention:  Discussion  Additional Comments:  Pt stated his goal for today was to get some more rest today, pt stated he was resting right before group. Pt states he is working towards discharge. Pt understands that this is a process and it takes time.  Caswell CorwinOwen, Fredi Geiler C 02/16/2017, 9:26 PM

## 2017-02-16 NOTE — Progress Notes (Signed)
D: Pt presents with flat affect and depressed mood. Pt rates depression 7/10. Anxiety 7/10. Hopelessness 8/10. Pt denies suicidal thoughts. Pt reports withdrawal symptoms of agitation, tremors, cramping, agitation and irritability. Pt detox protocol completed. Writer will obtain an order for Bentyl at pt request. Pt requesting to have antidepressant Celexa increased. Dr. Elsie SaasJonnalagadda made aware in treatment team. Pt verbalized that he's seeking long-term substance abuse tx.  A: Medications reviewed with pt. Medications administered as ordered per MD. Verbal support provided. Pt encouraged to attend groups. 15 minute checks performed for safety. R: Pt compliant with tx.

## 2017-02-16 NOTE — [facility] Group Notes (Signed)
[facility] LCSW Group Therapy 02/16/2017 1:15 PM  Type of Therapy: Group Therapy- Emotion Regulation  Participation Level: Active   Participation Quality:  Appropriate  Affect: Appropriate  Cognitive: Alert and Oriented   Insight:  Developing/Improving  Engagement in Therapy: Developing/Improving and Engaged   Modes of Intervention: Clarification, Confrontation, Discussion, Education, Exploration, Limit-setting, Orientation, Problem-solving, Rapport Building, Dance movement psychotherapisteality Testing, Socialization and Support  Summary of Progress/Problems: The topic for group today was emotional regulation. This group focused on both positive and negative emotion identification and allowed group members to process ways to identify feelings, regulate negative emotions, and find healthy ways to manage internal/external emotions. Group members were asked to reflect on a time when their reaction to an emotion led to a negative outcome and explored how alternative responses using emotion regulation would have benefited them. Group members were also asked to discuss a time when emotion regulation was utilized when a negative emotion was experienced. Pt did not identify an emotion that he has a difficult time regulating. However, pt did give meaningful and supportive feedback to peers that was well received. Pt also listened attentively throughout the group.   Jonathon JordanLynn B Evgenia Merriman, MSW, LCSWA 02/16/2017 4:12 PM

## 2017-02-16 NOTE — Progress Notes (Signed)
[facility]  02/16/2017 3:39 PM Gaspar BiddingRonnie Archila  MRN:  [id number]   Subjective: [name] reports, "I feel a little bit anxious today. I'm uncertain on how to make the next step work for me. I'm working on considering going to substance abuse treatment. I still have some huddles to clear. Some fleeting thoughts of suicide lingering still, but no plans".   Objective: [name] is seen, chart reviewed. This case is discussed with the treatment team. He is alert, oriented x 3 & aware of situation. He is visible on the unit attending group sessions. He continues to have hope of getting into a substance treatment program after discharge. However, says he still has some huddles to clear. He denies any HI, AVH, delusional thoughts or paranoia. He continues to endorse passive SI.  Principal Problem: Alcohol dependence with alcohol-induced mood disorder (HCC) Diagnosis:   Patient Active Problem List   Diagnosis Date Noted  . Alcohol dependence with alcohol-induced mood disorder (HCC) [F10.24]   . Major depressive disorder, recurrent (HCC) [F33.9] 07/17/2016  . Alcohol-induced mood disorder (HCC) [F10.94] 06/10/2016  . Cocaine dependence (HCC) [F14.20] 04/23/2014  . Substance induced mood disorder (HCC) [F19.94] 04/23/2014  . MDD (major depressive disorder) (HCC) [F32.9] 04/22/2014  . Alcohol use disorder, severe, dependence (HCC) [F10.20] 12/14/2013  . Major depressive disorder, recurrent severe without psychotic features (HCC) [F33.2] 12/14/2013  . Adult ADHD [F90.9] 12/14/2013  . Polysubstance abuse [F19.10] 12/13/2013  . Alcohol withdrawal (HCC) [F10.239] 10/28/2013   Total Time spent with patient: 15 minutes  Past Psychiatric History: see H&P  Past Medical History:  Past Medical History:  Diagnosis Date  . ADHD, adult residual type   . Anxiety   . Anxiety and depression   . Bipolar disorder (HCC)   . Depression   . Gastroesophageal reflux disease   . Knee pain   . Polysubstance  abuse     Past Surgical History:  Procedure Laterality Date  . TONSILLECTOMY     Family History: History reviewed. No pertinent family history.  Family Psychiatric  History: See H&P  Social History:  History  Alcohol Use  . Yes    Comment: in rehab      History  Drug Use  . Types: Heroin, Marijuana    Comment: in rehab    Social History   Social History  . Marital status: Divorced    Spouse name: N/A  . Number of children: N/A  . Years of education: N/A   Social History Main Topics  . Smoking status: Former Smoker    Packs/day: 1.00  . Smokeless tobacco: Never Used  . Alcohol use Yes     Comment: in rehab   . Drug use: Yes    Types: Heroin, Marijuana     Comment: in rehab  . Sexual activity: Not Asked   Other Topics Concern  . None   Social History Narrative  . None   Additional Social History:   Sleep: Improved  Appetite:  Fair  Current Medications: Current Facility-Administered Medications  Medication Dose Route Frequency Provider Last Rate Last Dose  . acetaminophen (TYLENOL) tablet 650 mg  650 mg Oral Q6H PRN Charm RingsLord, Jamison Y, NP      . alum & mag hydroxide-simeth (MAALOX/MYLANTA) 200-200-20 MG/5ML suspension 30 mL  30 mL Oral Q4H PRN Charm RingsLord, Jamison Y, NP      . citalopram (CELEXA) tablet 10 mg  10 mg Oral Daily Adonis BrookAgustin, Sheila, NP   10 mg at 02/16/17 [phone]  .  dicyclomine (BENTYL) tablet 20 mg  20 mg Oral Q6H PRN Armandina Stammer I, NP      . folic acid (FOLVITE) tablet 1 mg  1 mg Oral Daily Charm Rings, NP   1 mg at 02/16/17 [phone]  . magnesium hydroxide (MILK OF MAGNESIA) suspension 30 mL  30 mL Oral Daily PRN Charm Rings, NP      . mirtazapine (REMERON) tablet 15 mg  15 mg Oral QHS Beau Fanny, FNP   15 mg at 02/15/17 2231  . multivitamin with minerals tablet 1 tablet  1 tablet Oral Daily Armandina Stammer I, NP   1 tablet at 02/16/17 [phone]  . nicotine (NICODERM CQ - dosed in mg/24 hours) patch 21 mg  21 mg Transdermal Daily Nira Conn A, NP   21  mg at 02/16/17 5409  . QUEtiapine (SEROQUEL) tablet 25 mg  25 mg Oral BID Adonis Brook, NP   25 mg at 02/16/17 8119  . QUEtiapine (SEROQUEL) tablet 50 mg  50 mg Oral QHS Adonis Brook, NP   50 mg at 02/15/17 2231  . thiamine (VITAMIN B-1) tablet 100 mg  100 mg Oral Daily Charm Rings, NP   100 mg at 02/16/17 1478   Or  . thiamine (B-1) injection 100 mg  100 mg Intravenous Daily Charm Rings, NP       Lab Results: No results found for this or any previous visit (from the past 48 hour(s)).  Blood Alcohol level:  Lab Results  Component Value Date   ETH 161 (H) 02/10/2017   ETH 202 (H) 07/17/2016   Metabolic Disorder Labs: Lab Results  Component Value Date   HGBA1C 4.9 12/14/2013   MPG 94 12/14/2013   No results found for: PROLACTIN No results found for: CHOL, TRIG, HDL, CHOLHDL, VLDL, LDLCALC  Physical Findings: AIMS: Facial and Oral Movements Muscles of Facial Expression: None, normal Lips and Perioral Area: None, normal Jaw: None, normal Tongue: None, normal,Extremity Movements Upper (arms, wrists, hands, fingers): None, normal Lower (legs, knees, ankles, toes): None, normal, Trunk Movements Neck, shoulders, hips: None, normal, Overall Severity Severity of abnormal movements (highest score from questions above): None, normal Incapacitation due to abnormal movements: None, normal Patient's awareness of abnormal movements (rate only patient's report): No Awareness, Dental Status Current problems with teeth and/or dentures?: No Does patient usually wear dentures?: No  CIWA:  CIWA-Ar Total: 2 COWS:  COWS Total Score: 7  Musculoskeletal: Strength & Muscle Tone: within normal limits Gait & Station: normal Patient leans: N/A  Psychiatric Specialty Exam: Physical Exam  Nursing note and vitals reviewed. Constitutional: He is oriented to person, place, and time. He appears well-developed.  Cardiovascular: Normal rate.   Neurological: He is alert and oriented to  person, place, and time.  Psychiatric: He has a normal mood and affect. His behavior is normal.    Review of Systems  Gastrointestinal: Positive for nausea.  Musculoskeletal: Positive for myalgias.  Psychiatric/Behavioral: Positive for depression and substance abuse. Negative for hallucinations and suicidal ideas. The patient is nervous/anxious and has insomnia.   All other systems reviewed and are negative.   Blood pressure 138/88, pulse 68, temperature 97.7 F (36.5 C), temperature source Oral, resp. rate 16, height 6' (1.829 m), weight 79.4 kg (175 lb), SpO2 100 %.Body mass index is 23.73 kg/m.  General Appearance: Casual  Eye Contact:  Fair  Speech:  Clear and Coherent  Volume:  Normal  Mood:  Anxious, Depressed and Dysphoric  Affect:  Depressed and Flat  Thought Process:  Coherent  Orientation:  Full (Time, Place, and Person)  Thought Content: Continues to  focused on treatment options  Suicidal Thoughts:  No  Homicidal Thoughts:  No  Memory:  Immediate;   Fair Recent;   Fair Remote;   Fair  Judgement:  Fair  Insight:  Fair  Psychomotor Activity:  Normal  Concentration:  Concentration: Fair and Attention Span: Fair  Recall:  [employer] of Knowledge:  Fair  Language:  Fair  Akathisia:  No  Handed:    AIMS (if indicated):     Assets:  Desire for Improvement Resilience Social Support  ADL's:  Intact  Cognition:  WNL  Sleep:  Number of Hours: 6.5    Treatment Plan Summary:  Reviewed and will cont 02/16/2017 Alcohol dependence with alcohol-induced mood disorder (HCC) with MDD, recurrent, severe, without psychosis, both unstable, managed as below:  Medications:  - Continue Seroquel 25 mg BID for mood stabilization & 50 mg for nightly dose - Continue Celexa 10 mg for MDD -Continue  Remeron 15 mg po qhs for MDD, insomnia -Continue Nicotine patch for smoking cessation. -Continue CIWA / Ativan detox protocol  -Continue COWS /clonidine detox protocol due to heroin abuse  (although flagged negative on UDS for opiates)  Will continue to monitor vitals ,medication compliance and treatment side effects while patient is here.  Reviewed labs,BAL - 161, UDS - pos for cocaine and benzodizpines. CSW will start working on disposition.  Patient to participate in therapeutic milieu  Nwoko, Nelda Marseille, NP PMHNP, FNP-BC.[facility] 02/16/2017, 3:39 PM  Reviewed the information documented and agree with the treatment plan.  Thaddaeus Granja 02/17/2017 11:28 AM

## 2017-02-17 MED ORDER — CITALOPRAM HYDROBROMIDE 20 MG PO TABS
20.0000 mg | ORAL_TABLET | Freq: Every day | ORAL | Status: DC
  Administered 2017-02-18: 20 mg via ORAL
  Filled 2017-02-17 (×3): qty 1

## 2017-02-17 NOTE — [facility] Group Notes (Signed)
[facility] LCSW Group Therapy 02/17/2017 1:15pm  Type of Therapy: Group Therapy- Balance in Life  Participation Level: Active   Description of the Group:  The topic for group was balance in life. Today's group focused on defining balance in one's own words, identifying things that can knock one off balance, and exploring healthy ways to maintain balance in life. Group members were asked to provide an example of a time when they felt off balance, describe how they handled that situation,and process healthier ways to regain balance in the future. Group members were asked to share the most important tool for maintaining balance that [facility] and how they plan to apply this method after discharge.   Therapeutic Modalities:   Cognitive Behavioral Therapy Solution-Focused Therapy Assertiveness Training   Jonathon JordanLynn B Siyana Arroyo, MSW, LCSWA 02/17/2017 4:03 PM

## 2017-02-17 NOTE — Progress Notes (Signed)
Adult Psychoeducational Group Note  Date:  02/17/2017 Time:10:00AM Group Topic/Focus:  Making Healthy Choices:   The focus of this group is to help patients identify negative/unhealthy choices they were using prior to admission and identify positive/healthier coping strategies to replace them upon discharge.  Participation Level:  Active  Participation Quality:  Appropriate and Attentive  Affect:  Appropriate  Cognitive:  Alert and Appropriate  Insight: Appropriate and Good  Engagement in Group:  Engaged  Modes of Intervention:  Discussion, Education, Exploration and Support  Additional Comments:  Patient states goal for today is to talk to social worker about discharge plan.  Patient was attentive and receptive.  Mickie Baillizabeth O Iwenekha 02/17/2017, 3:32 PM

## 2017-02-17 NOTE — Progress Notes (Signed)
Pt attended Karaoke and sang one song.  No record contract offered to pt.  Pt ask to delay meds until before bedtime.  Pt is engaging other peers in dayroom and seems more animated.  Pt had visitor earlier.  Pt denies SI, HI and AVH.  Pt contracts, verbally for safety.  Pt denies pain or discomfort and sts withdrawal seems to be getting better.  Pt offered support and encouragement. Pt remains safe on unit.

## 2017-02-17 NOTE — Progress Notes (Signed)
Per pt request CSW called pt's public defender, Judd LienShaketa Berrie at [phone]. No answer, CSW left a message. [facility] referral cannot move forward without a letter from pt's attorney writing a letter stating that pt's court date has been extended. CSW has been thus far unable to reach pt's attorney. CSW will continue to try.  Jonathon JordanLynn B Priyana Mccarey, MSW, Theresia MajorsLCSWA [phone]

## 2017-02-17 NOTE — Progress Notes (Signed)
[facility] MD Progress Note  02/17/2017 2:56 PM Daniel Arroyo  MRN:  [id number]   Subjective: Daniel Arroyo reports, "I'm a bit better today but still low, agitated and frustrated. My court date is coming up in June either 4th or 6th and I have to get a letter from a lawyer to get into the treatment program in [place]. I spoke with the social worker and they are still working on it. I'm trying to be patient and make positive choices".   Objective: Trygve is seen, chart reviewed. This case is discussed with the treatment team. He is alert, oriented x 3 & aware of situation. He is visible on the unit attending group sessions. He continues to have hope of getting into a substance treatment program after discharge. Pt reports the tolerating the Celexa 10mg  but reports dose is not yet effective. Pt reports sleep was much improved with the addition of HS Seroquel. Patient continues to endorse intermittent depression and anxiety due to being worried about returning back to where is will be triggered to using cocaine again. but is trying to stay positive. Pt says he attends groups and and goes outside for activities. Pt reports passive SI without plan. Patient denies any HI, AVH, delusional thoughts or paranoia.   Principal Problem: Alcohol dependence with alcohol-induced mood disorder (HCC) Diagnosis:   Patient Active Problem List   Diagnosis Date Noted  . Alcohol dependence with alcohol-induced mood disorder (HCC) [F10.24]   . Major depressive disorder, recurrent (HCC) [F33.9] 07/17/2016  . Alcohol-induced mood disorder (HCC) [F10.94] 06/10/2016  . Cocaine dependence (HCC) [F14.20] 04/23/2014  . Substance induced mood disorder (HCC) [F19.94] 04/23/2014  . MDD (major depressive disorder) (HCC) [F32.9] 04/22/2014  . Alcohol use disorder, severe, dependence (HCC) [F10.20] 12/14/2013  . Major depressive disorder, recurrent severe without psychotic features (HCC) [F33.2] 12/14/2013  . Adult ADHD [F90.9] 12/14/2013  .  Polysubstance abuse [F19.10] 12/13/2013  . Alcohol withdrawal (HCC) [F10.239] 10/28/2013   Total Time spent with patient: 15 minutes  Past Psychiatric History: see H&P  Past Medical History:  Past Medical History:  Diagnosis Date  . ADHD, adult residual type   . Anxiety   . Anxiety and depression   . Bipolar disorder (HCC)   . Depression   . Gastroesophageal reflux disease   . Knee pain   . Polysubstance abuse     Past Surgical History:  Procedure Laterality Date  . TONSILLECTOMY     Family History: History reviewed. No pertinent family history.  Family Psychiatric  History: See H&P  Social History:  History  Alcohol Use  . Yes    Comment: in rehab      History  Drug Use  . Types: Heroin, Marijuana    Comment: in rehab    Social History   Social History  . Marital status: Divorced    Spouse name: N/A  . Number of children: N/A  . Years of education: N/A   Social History Main Topics  . Smoking status: Former Smoker    Packs/day: 1.00  . Smokeless tobacco: Never Used  . Alcohol use Yes     Comment: in rehab   . Drug use: Yes    Types: Heroin, Marijuana     Comment: in rehab  . Sexual activity: Not Asked   Other Topics Concern  . None   Social History Narrative  . None   Additional Social History:   Sleep: Improved  Appetite:  Fair  Current Medications: Current Facility-Administered Medications  Medication  Dose Route Frequency Provider Last Rate Last Dose  . acetaminophen (TYLENOL) tablet 650 mg  650 mg Oral Q6H PRN Charm RingsLord, Jamison Y, NP   650 mg at 02/16/17 1712  . alum & mag hydroxide-simeth (MAALOX/MYLANTA) 200-200-20 MG/5ML suspension 30 mL  30 mL Oral Q4H PRN Charm RingsLord, Jamison Y, NP      . citalopram (CELEXA) tablet 10 mg  10 mg Oral Daily Adonis BrookAgustin, Sheila, NP   10 mg at 02/17/17 [id number]  . dicyclomine (BENTYL) tablet 20 mg  20 mg Oral Q6H PRN Armandina StammerNwoko, Agnes I, NP   20 mg at 02/16/17 2221  . folic acid (FOLVITE) tablet 1 mg  1 mg Oral Daily Charm RingsLord,  Jamison Y, NP   1 mg at 02/17/17 [id number]  . magnesium hydroxide (MILK OF MAGNESIA) suspension 30 mL  30 mL Oral Daily PRN Charm RingsLord, Jamison Y, NP      . mirtazapine (REMERON) tablet 15 mg  15 mg Oral QHS Beau FannyWithrow, [name], FNP   15 mg at 02/16/17 2221  . multivitamin with minerals tablet 1 tablet  1 tablet Oral Daily Armandina StammerNwoko, Agnes I, NP   1 tablet at 02/17/17 [id number]  . nicotine (NICODERM CQ - dosed in mg/24 hours) patch 21 mg  21 mg Transdermal Daily Nira ConnBerry, Jason A, NP   21 mg at 02/17/17 0800  . QUEtiapine (SEROQUEL) tablet 25 mg  25 mg Oral BID Adonis BrookAgustin, Sheila, NP   25 mg at 02/17/17 [id number]  . QUEtiapine (SEROQUEL) tablet 50 mg  50 mg Oral QHS Adonis BrookAgustin, Sheila, NP   50 mg at 02/16/17 2221  . thiamine (VITAMIN B-1) tablet 100 mg  100 mg Oral Daily Charm RingsLord, Jamison Y, NP   100 mg at 02/17/17 [id number]   Or  . thiamine (B-1) injection 100 mg  100 mg Intravenous Daily Charm RingsLord, Jamison Y, NP       Lab Results: No results found for this or any previous visit (from the past 48 hour(s)).  Blood Alcohol level:  Lab Results  Component Value Date   ETH 161 (H) 02/10/2017   ETH 202 (H) 07/17/2016   Metabolic Disorder Labs: Lab Results  Component Value Date   HGBA1C 4.9 12/14/2013   MPG 94 12/14/2013   No results found for: PROLACTIN No results found for: CHOL, TRIG, HDL, CHOLHDL, VLDL, LDLCALC  Physical Findings: AIMS: Facial and Oral Movements Muscles of Facial Expression: None, normal Lips and Perioral Area: None, normal Jaw: None, normal Tongue: None, normal,Extremity Movements Upper (arms, wrists, hands, fingers): None, normal Lower (legs, knees, ankles, toes): None, normal, Trunk Movements Neck, shoulders, hips: None, normal, Overall Severity Severity of abnormal movements (highest score from questions above): None, normal Incapacitation due to abnormal movements: None, normal Patient's awareness of abnormal movements (rate only patient's report): No Awareness, Dental Status Current problems with teeth  and/or dentures?: No Does patient usually wear dentures?: No  CIWA:  CIWA-Ar Total: 2 COWS:  COWS Total Score: 7  Musculoskeletal: Strength & Muscle Tone: within normal limits Gait & Station: normal Patient leans: N/A  Psychiatric Specialty Exam: Physical Exam  Nursing note and vitals reviewed. Constitutional: He is oriented to person, place, and time. He appears well-developed.  Cardiovascular: Normal rate.   Neurological: He is alert and oriented to person, place, and time.  Psychiatric: He has a normal mood and affect. His behavior is normal.    Review of Systems  Constitutional: Negative.   HENT: Negative.   Eyes: Negative.   Respiratory: Negative.   Cardiovascular:  Negative.   Gastrointestinal: Negative.  Negative for nausea.  Genitourinary: Negative.   Skin: Negative.   Neurological: Negative.   Endo/Heme/Allergies: Negative.   Psychiatric/Behavioral: Positive for depression ("Improving") and substance abuse ( UDS (+) for Opioid, Cocaine & ETOH). Negative for hallucinations, memory loss and suicidal ideas. The patient is nervous/anxious and has insomnia.   All other systems reviewed and are negative.   Blood pressure 131/86, pulse 68, temperature 97.6 F (36.4 C), temperature source Oral, resp. rate 16, height 6' (1.829 m), weight 79.4 kg (175 lb), SpO2 100 %.Body mass index is 23.73 kg/m.  General Appearance: Casual  Eye Contact:  Fair  Speech:  Clear and Coherent  Volume:  Normal  Mood:  "Improved"  Affect:  Appropriate and Congruent  Thought Process:  Coherent and Linear  Orientation:  Full (Time, Place, and Person)  Thought Content: Continues to  focused on treatment options  Suicidal Thoughts:  No  Homicidal Thoughts:  No  Memory:  Immediate;   Good Recent;   Good Remote;   Good  Judgement:  Fair  Insight:  Fair  Psychomotor Activity:  Normal  Concentration:  Concentration: Fair and Attention Span: Fair  Recall:  [employer] of Knowledge:  Fair   Language:  Fair  Akathisia:  No  Handed:    AIMS (if indicated):     Assets:  Desire for Improvement Resilience Social Support  ADL's:  Intact  Cognition:  WNL  Sleep:  Number of Hours: 6.75   Treatment Plan Summary:  Reviewed and will cont 02/17/2017 Alcohol dependence with alcohol-induced mood disorder (HCC) with MDD, recurrent, severe, without psychosis, both unstable, managed as below:  Medications:  - Continue Seroquel 25 mg BID for mood stabilization & 50 mg for nightly dose - Increased Celexa from 10 mg to 20 mg daily for MDD -Continue  Remeron 15 mg po qhs for MDD, insomnia -Continue Nicotine patch for smoking cessation. -Completed Clonidine detox protocol. Will continue to monitor vitals ,medication compliance and treatment side effects while patient is here.  Reviewed labs,BAL - 161, UDS - pos for cocaine and benzodizpines. CSW will start working on disposition.  Patient to participate in therapeutic milieu  Delila Pereyra,  02/17/2017 2:56 PMPatient ID: Daniel Arroyo, male   DOB: Nov 16, 1970, 46 y.o.   MRN: [id number]

## 2017-02-17 NOTE — Progress Notes (Signed)
Pt attended wrap up karaoke group. Pt was engaged and participated by singing one song. Dawsen Krieger C, NT 02/17/17  9:57 PM 

## 2017-02-17 NOTE — Progress Notes (Signed)
DAR NOTE: Patient presents with anxious affect and depressed mood.  Denies pain, auditory and visual hallucinations.  Described energy level as low and concentration as poor.  Reports withdrawal symptoms of tremors, agitation and irritability on self inventory form.  Reports suicidal thoughts but contracts for safety.  Rates depression at 7, hopelessness at 7, and anxiety at 7.  Maintained on routine safety checks.  Medications given as prescribed.  Support and encouragement offered as needed.  Attended group and participated.  States goal for today is "to rest and stay positive."  Patient observed socializing with peers in the dayroom.  Offered no complaint.

## 2017-02-18 MED ORDER — SERTRALINE HCL 50 MG PO TABS
50.0000 mg | ORAL_TABLET | Freq: Every day | ORAL | Status: DC
  Administered 2017-02-19 – 2017-02-22 (×4): 50 mg via ORAL
  Filled 2017-02-18: qty 21
  Filled 2017-02-18 (×6): qty 1

## 2017-02-18 NOTE — Progress Notes (Signed)
Recreation Therapy Notes  Date:  02/18/17 Time: 0930 Location: [address] Dayroom  Group Topic: Stress Management  Goal Area(s) Addresses:  Patient will verbalize importance of using healthy stress management.  Patient will identify positive emotions associated with healthy stress management.   Behavioral Response: Engaged  Intervention: Stress Management  Activity :  Guided Imagery.  LRT introduced the stress management technique of guided imagery.  LRT read a script to allow patients to engage in the activity.  Patients were to follow along with as LRT read the script to participate in the activity.  Education:  Stress Management, Discharge Planning.   Education Outcome: Acknowledges edcuation/In group clarification offered/Needs additional education  Clinical Observations/Feedback: Pt attended group.   Caroll RancherMarjette Swara Donze, LRT/CTRS         Lillia AbedLindsay, Layana Konkel A 02/18/2017 11:21 AM

## 2017-02-18 NOTE — Tx Team (Signed)
Interdisciplinary Treatment and Diagnostic Plan Update 02/18/2017 Time of Session: 9:30am  Daniel BiddingRonnie Arroyo  MRN: [id number]  Principal Diagnosis: Alcohol dependence with alcohol-induced mood disorder (HCC)  Secondary Diagnoses: Principal Problem:   Alcohol dependence with alcohol-induced mood disorder (HCC) Active Problems:   Major depressive disorder, recurrent severe without psychotic features (HCC)   Current Medications:  Current Facility-Administered Medications  Medication Dose Route Frequency Provider Last Rate Last Dose  . acetaminophen (TYLENOL) tablet 650 mg  650 mg Oral Q6H PRN Charm RingsLord, Jamison Y, NP   650 mg at 02/16/17 1712  . alum & mag hydroxide-simeth (MAALOX/MYLANTA) 200-200-20 MG/5ML suspension 30 mL  30 mL Oral Q4H PRN Charm RingsLord, Jamison Y, NP      . citalopram (CELEXA) tablet 20 mg  20 mg Oral Daily [place], Justina A, NP   20 mg at 02/18/17 0843  . dicyclomine (BENTYL) tablet 20 mg  20 mg Oral Q6H PRN Armandina StammerNwoko, Agnes I, NP   20 mg at 02/16/17 2221  . folic acid (FOLVITE) tablet 1 mg  1 mg Oral Daily Charm RingsLord, Jamison Y, NP   1 mg at 02/18/17 0843  . magnesium hydroxide (MILK OF MAGNESIA) suspension 30 mL  30 mL Oral Daily PRN Charm RingsLord, Jamison Y, NP      . mirtazapine (REMERON) tablet 15 mg  15 mg Oral QHS Beau FannyWithrow, [name], FNP   15 mg at 02/17/17 2259  . multivitamin with minerals tablet 1 tablet  1 tablet Oral Daily Armandina StammerNwoko, Agnes I, NP   1 tablet at 02/18/17 0843  . nicotine (NICODERM CQ - dosed in mg/24 hours) patch 21 mg  21 mg Transdermal Daily Nira ConnBerry, Jason A, NP   21 mg at 02/18/17 0843  . QUEtiapine (SEROQUEL) tablet 25 mg  25 mg Oral BID Adonis BrookAgustin, Sheila, NP   25 mg at 02/18/17 0844  . QUEtiapine (SEROQUEL) tablet 50 mg  50 mg Oral QHS Adonis BrookAgustin, Sheila, NP   50 mg at 02/17/17 2259  . thiamine (VITAMIN B-1) tablet 100 mg  100 mg Oral Daily Charm RingsLord, Jamison Y, NP   100 mg at 02/18/17 [id number]   Or  . thiamine (B-1) injection 100 mg  100 mg Intravenous Daily Charm RingsLord, Jamison Y, NP        PTA  Medications: Prescriptions Prior to Admission  Medication Sig Dispense Refill Last Dose  . cetirizine (ZYRTEC) 10 MG tablet Take 1 tablet (10 mg total) by mouth daily. (Patient not taking: Reported on 02/10/2017) 30 tablet 0 Not Taking at Unknown time  . fluticasone (FLONASE) 50 MCG/ACT nasal spray Place 2 sprays into both nostrils daily. (Patient not taking: Reported on 02/10/2017) 16 g 0 Not Taking at Unknown time  . gabapentin (NEURONTIN) 300 MG capsule Take 1 capsule (300 mg total) by mouth 3 (three) times daily. (Patient not taking: Reported on 02/10/2017) 90 capsule 0 Not Taking at Unknown time  . hydrOXYzine (ATARAX/VISTARIL) 25 MG tablet Take 1 tablet (25 mg total) by mouth every 6 (six) hours as needed for anxiety. (Patient not taking: Reported on 02/10/2017) 30 tablet 0 Not Taking at Unknown time  . mirtazapine (REMERON) 15 MG tablet Take 1 tablet (15 mg total) by mouth at bedtime. (Patient not taking: Reported on 02/10/2017) 30 tablet 0 Not Taking at Unknown time  . naltrexone (DEPADE) 50 MG tablet Take 1 tablet (50 mg total) by mouth daily. (Patient not taking: Reported on 02/10/2017) 30 tablet 0 Not Taking at Unknown time  . nicotine (NICODERM CQ - DOSED IN MG/24  HOURS) 21 mg/24hr patch Place 1 patch (21 mg total) onto the skin daily at 6 (six) AM. (Patient not taking: Reported on 02/10/2017) 28 patch 0 Not Taking at Unknown time  . pantoprazole (PROTONIX) 20 MG tablet Take 1 tablet (20 mg total) by mouth daily. (Patient not taking: Reported on 02/10/2017) 30 tablet 0 Not Taking at Unknown time  . thiamine 100 MG tablet Take 1 tablet (100 mg total) by mouth daily. (Patient not taking: Reported on 02/10/2017) 30 tablet 0 Not Taking at Unknown time    Treatment Modalities: Medication Management, Group therapy, Case management,  1 to 1 session with clinician, Psychoeducation, Recreational therapy.  Patient Stressors: Financial difficulties Occupational concerns Substance abuse Patient Strengths:  Ability for insight Average or above average intelligence Capable of independent living Barrister's clerk for treatment/growth Supportive family/friends  Physician Treatment Plan for Primary Diagnosis: Alcohol dependence with alcohol-induced mood disorder (HCC) Long Term Goal(s): Improvement in symptoms so as ready for discharge Short Term Goals: Ability to verbalize feelings will improve Ability to disclose and discuss suicidal ideas Ability to demonstrate self-control will improve Ability to identify changes in lifestyle to reduce recurrence of condition will improve Ability to demonstrate self-control will improve Ability to identify and develop effective coping behaviors will improve Ability to identify triggers associated with substance abuse/mental health issues will improve  Medication Management: Evaluate patient's response, side effects, and tolerance of medication regimen.  Therapeutic Interventions: 1 to 1 sessions, Unit Group sessions and Medication administration.  Evaluation of Outcomes: Progressing  Physician Treatment Plan for Secondary Diagnosis: Principal Problem:   Alcohol dependence with alcohol-induced mood disorder (HCC) Active Problems:   Major depressive disorder, recurrent severe without psychotic features (HCC)  Long Term Goal(s): Improvement in symptoms so as ready for discharge  Short Term Goals: Ability to verbalize feelings will improve Ability to disclose and discuss suicidal ideas Ability to demonstrate self-control will improve Ability to identify changes in lifestyle to reduce recurrence of condition will improve Ability to demonstrate self-control will improve Ability to identify and develop effective coping behaviors will improve Ability to identify triggers associated with substance abuse/mental health issues will improve  Medication Management: Evaluate patient's response, side effects, and tolerance of medication  regimen.  Therapeutic Interventions: 1 to 1 sessions, Unit Group sessions and Medication administration.  Evaluation of Outcomes: Progressing  RN Treatment Plan for Primary Diagnosis: Alcohol dependence with alcohol-induced mood disorder (HCC) Long Term Goal(s): Knowledge of disease and therapeutic regimen to maintain health will improve  Short Term Goals: Ability to remain free from injury will improve and Compliance with prescribed medications will improve  Medication Management: RN will administer medications as ordered by provider, will assess and evaluate patient's response and provide education to patient for prescribed medication. RN will report any adverse and/or side effects to prescribing provider.  Therapeutic Interventions: 1 on 1 counseling sessions, Psychoeducation, Medication administration, Evaluate responses to treatment, Monitor vital signs and CBGs as ordered, Perform/monitor CIWA, COWS, AIMS and Fall Risk screenings as ordered, Perform wound care treatments as ordered.  Evaluation of Outcomes: Progressing  LCSW Treatment Plan for Primary Diagnosis: Alcohol dependence with alcohol-induced mood disorder (HCC) Long Term Goal(s): Safe transition to appropriate next level of care at discharge, Engage patient in therapeutic group addressing interpersonal concerns. Short Term Goals: Engage patient in aftercare planning with referrals and resources, Facilitate patient progression through stages of change regarding substance use diagnoses and concerns, Identify triggers associated with mental health/substance abuse issues and Increase skills for wellness  and recovery  Therapeutic Interventions: Assess for all discharge needs, 1 to 1 time with Child psychotherapist, Explore available resources and support systems, Assess for adequacy in community support network, Educate family and significant other(s) on suicide prevention, Complete Psychosocial Assessment, Interpersonal group  therapy.  Evaluation of Outcomes: Progressing  Progress in Treatment: Attending groups: Yes Participating in groups: Yes Taking medication as prescribed: Yes, MD continues to assess for medication changes as needed Toleration medication: Yes, no side effects reported at this time Family/Significant other contact made: No, pt declined consent. Patient understands diagnosis: Developing insight Discussing patient identified problems/goals with staff: Yes Medical problems stabilized or resolved: Yes Denies suicidal/homicidal ideation: Yes Issues/concerns per patient self-inventory: None Other: N/A  New problem(s) identified: None identified at this time.   New Short Term/Long Term Goal(s): None identified at this time.   Discharge Plan or Barriers: Pt will discharge to [facility] for treatment or follow-up outpatient with [facility].  Reason for Continuation of Hospitalization:  Depression Medication stabilization Suicidal ideation Withdrawal symptoms  Estimated Length of Stay: 1-4 days  Attendees: Patient: 02/18/2017 9:43 AM  Physician: Dr. Rene Kocher 02/18/2017 9:43 AM  Nursing: Alexia Freestone RN; Boyd Kerbs, RN 02/18/2017 9:43 AM  RN Care Manager: 02/18/2017 9:43 AM  Social Worker:Merelyn Klump Sherre Poot 02/18/2017 9:43 AM  Recreational Therapist:  02/18/2017 9:43 AM  Other: Armandina Stammer, NP 02/18/2017 9:43 AM  Other:  02/18/2017 9:43 AM  Other: 02/18/2017 9:43 AM   Scribe for Treatment Team: Jonathon Jordan, MSW,LCSWA 02/18/2017 9:43 AM

## 2017-02-18 NOTE — [facility] Group Notes (Signed)
[facility] LCSW Group Therapy 02/18/2017 1:15pm  Type of Therapy: Group Therapy- Feelings Around Relapse and Recovery  Participation Level: Active   Participation Quality:  Appropriate  Affect:  Appropriate  Cognitive: Alert and Oriented   Insight:  Developing   Engagement in Therapy: Developing/Improving and Engaged   Modes of Intervention: Clarification, Confrontation, Discussion, Education, Exploration, Limit-setting, Orientation, Problem-solving, Rapport Building, Dance movement psychotherapisteality Testing, Socialization and Support  Summary of Progress/Problems: The topic for today was feelings about relapse. The group discussed what relapse prevention is to them and identified triggers that they are on the path to relapse. Members also processed their feeling towards relapse and were able to relate to common experiences. Group also discussed coping skills that can be used for relapse prevention.     Therapeutic Modalities:   Cognitive Behavioral Therapy Solution-Focused Therapy Assertiveness Training Relapse Prevention Therapy    Daniel JordanLynn B Daniel Arroyo, MSW, LCSWA [phone] 02/18/2017 4:03 PM

## 2017-02-18 NOTE — Progress Notes (Signed)
D: Pt denies SI/HI/AVh. Pt is pleasant and cooperative. Pt stated he was better due to getting rest, having positive outlook on his situation  A: Pt was offered support and encouragement. Pt was given scheduled medications. Pt was encourage to attend groups. Q 15 minute checks were done for safety.   R:Pt attends groups and interacts well with peers and staff. Pt is taking medication. Pt has no complaints.Pt receptive to treatment and safety maintained on unit.

## 2017-02-18 NOTE — Plan of Care (Signed)
Problem: Safety: Goal: Periods of time without injury will increase Outcome: Progressing Pt safe on the unit at this time   

## 2017-02-18 NOTE — Progress Notes (Signed)
[facility] MD Progress Note  02/18/2017 2:13 PM Zadin Lange  MRN:  [id number]   Subjective: Rolondo reports that he slept well. Wonders about his antidepressant, celexa.  Read it was weak, I agreed.  He wishes to switch to more potent, so we discussed zoloft.  His main concern is getting to [facility] and ongoing legal issues. No other acute concerns at this time.  Principal Problem: Alcohol dependence with alcohol-induced mood disorder (HCC) Diagnosis:   Patient Active Problem List   Diagnosis Date Noted  . Alcohol dependence with alcohol-induced mood disorder (HCC) [F10.24]   . Major depressive disorder, recurrent (HCC) [F33.9] 07/17/2016  . Alcohol-induced mood disorder (HCC) [F10.94] 06/10/2016  . Cocaine dependence (HCC) [F14.20] 04/23/2014  . Substance induced mood disorder (HCC) [F19.94] 04/23/2014  . MDD (major depressive disorder) (HCC) [F32.9] 04/22/2014  . Alcohol use disorder, severe, dependence (HCC) [F10.20] 12/14/2013  . Major depressive disorder, recurrent severe without psychotic features (HCC) [F33.2] 12/14/2013  . Adult ADHD [F90.9] 12/14/2013  . Polysubstance abuse [F19.10] 12/13/2013  . Alcohol withdrawal (HCC) [F10.239] 10/28/2013   Total Time spent with patient: 15 minutes  Past Psychiatric History: see H&P  Past Medical History:  Past Medical History:  Diagnosis Date  . ADHD, adult residual type   . Anxiety   . Anxiety and depression   . Bipolar disorder (HCC)   . Depression   . Gastroesophageal reflux disease   . Knee pain   . Polysubstance abuse     Past Surgical History:  Procedure Laterality Date  . TONSILLECTOMY     Family History: History reviewed. No pertinent family history.  Family Psychiatric  History: See H&P  Social History:  History  Alcohol Use  . Yes    Comment: in rehab      History  Drug Use  . Types: Heroin, Marijuana    Comment: in rehab    Social History   Social History  . Marital status: Divorced    Spouse name: N/A  .  Number of children: N/A  . Years of education: N/A   Social History Main Topics  . Smoking status: Former Smoker    Packs/day: 1.00  . Smokeless tobacco: Never Used  . Alcohol use Yes     Comment: in rehab   . Drug use: Yes    Types: Heroin, Marijuana     Comment: in rehab  . Sexual activity: Not Asked   Other Topics Concern  . None   Social History Narrative  . None   Additional Social History:   Sleep: Improved  Appetite:  Fair  Current Medications: Current Facility-Administered Medications  Medication Dose Route Frequency Provider Last Rate Last Dose  . acetaminophen (TYLENOL) tablet 650 mg  650 mg Oral Q6H PRN Charm Rings, NP   650 mg at 02/16/17 1712  . alum & mag hydroxide-simeth (MAALOX/MYLANTA) 200-200-20 MG/5ML suspension 30 mL  30 mL Oral Q4H PRN Charm Rings, NP      . dicyclomine (BENTYL) tablet 20 mg  20 mg Oral Q6H PRN Armandina Stammer I, NP   20 mg at 02/16/17 2221  . folic acid (FOLVITE) tablet 1 mg  1 mg Oral Daily Charm Rings, NP   1 mg at 02/18/17 0843  . magnesium hydroxide (MILK OF MAGNESIA) suspension 30 mL  30 mL Oral Daily PRN Charm Rings, NP      . mirtazapine (REMERON) tablet 15 mg  15 mg Oral QHS Withrow, Everardo All, FNP   15  mg at 02/17/17 2259  . multivitamin with minerals tablet 1 tablet  1 tablet Oral Daily Armandina Stammer I, NP   1 tablet at 02/18/17 0843  . nicotine (NICODERM CQ - dosed in mg/24 hours) patch 21 mg  21 mg Transdermal Daily Nira Conn A, NP   21 mg at 02/18/17 0843  . QUEtiapine (SEROQUEL) tablet 25 mg  25 mg Oral BID Adonis Brook, NP   25 mg at 02/18/17 0844  . QUEtiapine (SEROQUEL) tablet 50 mg  50 mg Oral QHS Adonis Brook, NP   50 mg at 02/17/17 2259  . [START ON 02/19/2017] sertraline (ZOLOFT) tablet 50 mg  50 mg Oral Daily Burnard Leigh, MD      . thiamine (VITAMIN B-1) tablet 100 mg  100 mg Oral Daily Charm Rings, NP   100 mg at 02/18/17 1610   Or  . thiamine (B-1) injection 100 mg  100 mg  Intravenous Daily Charm Rings, NP       Lab Results: No results found for this or any previous visit (from the past 48 hour(s)).  Blood Alcohol level:  Lab Results  Component Value Date   ETH 161 (H) 02/10/2017   ETH 202 (H) 07/17/2016   Metabolic Disorder Labs: Lab Results  Component Value Date   HGBA1C 4.9 12/14/2013   MPG 94 12/14/2013   No results found for: PROLACTIN No results found for: CHOL, TRIG, HDL, CHOLHDL, VLDL, LDLCALC  Physical Findings: AIMS: Facial and Oral Movements Muscles of Facial Expression: None, normal Lips and Perioral Area: None, normal Jaw: None, normal Tongue: None, normal,Extremity Movements Upper (arms, wrists, hands, fingers): None, normal Lower (legs, knees, ankles, toes): None, normal, Trunk Movements Neck, shoulders, hips: None, normal, Overall Severity Severity of abnormal movements (highest score from questions above): None, normal Incapacitation due to abnormal movements: None, normal Patient's awareness of abnormal movements (rate only patient's report): No Awareness, Dental Status Current problems with teeth and/or dentures?: No Does patient usually wear dentures?: No  CIWA:  CIWA-Ar Total: 2 COWS:  COWS Total Score: 3  Musculoskeletal: Strength & Muscle Tone: within normal limits Gait & Station: normal Patient leans: N/A  Psychiatric Specialty Exam: Physical Exam  Nursing note and vitals reviewed. Constitutional: He is oriented to person, place, and time. He appears well-developed.  Cardiovascular: Normal rate.   Neurological: He is alert and oriented to person, place, and time.  Psychiatric: He has a normal mood and affect. His behavior is normal.    Review of Systems  Constitutional: Negative.   HENT: Negative.   Eyes: Negative.   Respiratory: Negative.   Cardiovascular: Negative.   Gastrointestinal: Negative.  Negative for nausea.  Genitourinary: Negative.   Skin: Negative.   Neurological: Negative.    Endo/Heme/Allergies: Negative.   Psychiatric/Behavioral: Positive for depression ("Improving") and substance abuse ( UDS (+) for Opioid, Cocaine & ETOH). Negative for hallucinations, memory loss and suicidal ideas. The patient is nervous/anxious and has insomnia.   All other systems reviewed and are negative.   Blood pressure 136/86, pulse 80, temperature 97.4 F (36.3 C), temperature source Oral, resp. rate 18, height 6' (1.829 m), weight 79.4 kg (175 lb), SpO2 100 %.Body mass index is 23.73 kg/m.  General Appearance: Casual  Eye Contact:  Fair  Speech:  Clear and Coherent  Volume:  Normal  Mood:  Euthymic  Affect:  Appropriate and Congruent  Thought Process:  Coherent and Linear  Orientation:  Full (Time, Place, and Person)  Thought Content:  Continues to  focused on treatment options  Suicidal Thoughts:  No  Homicidal Thoughts:  No  Memory:  Immediate;   Good Recent;   Good Remote;   Good  Judgement:  Fair  Insight:  Fair  Psychomotor Activity:  Normal  Concentration:  Concentration: Fair and Attention Span: Fair  Recall:  [employer]is[employer]Fair  Fund of Knowledge:  Fair  Language:  Fair  Akathisia:  No  Handed:    AIMS (if indicated):     Assets:  Desire for Improvement Resilience Social Support  ADL's:  Intact  Cognition:  WNL  Sleep:  Number of Hours: 5.5   Treatment Plan Summary:  Reviewed and will cont 02/18/2017 Alcohol dependence with alcohol-induced mood disorder (HCC) with MDD, recurrent, severe, without psychosis, both unstable, managed as below:  Medications:  - Zoloft 50 mg daily, increase to 100 mg by sunday -Continue  Remeron 15 mg po qhs for MDD, insomnia -Continue Nicotine patch for smoking cessation. -Completed Clonidine detox protocol. Will continue to monitor vitals ,medication compliance and treatment side effects while patient is here.  Reviewed labs,BAL - 161, UDS - pos for cocaine and benzodizpines. CSW will start working on disposition.  Patient to  participate in therapeutic milieu  Burnard Leighlexander Arya Eksir,  02/18/2017 2:13 PMPatient ID: Gaspar Biddingonnie Tuley, male   DOB: Dec 28, 1970, 46 y.o.   MRN: [id number]

## 2017-02-18 NOTE — Progress Notes (Signed)
[name] is progressing well. HE is seen dressed in his clean street clothes. He stays in the dayroom...interacting with his peers . He is observed talking animatedly to other patients and staff thorughout the day and when writer approaches him and asks hm how he is doing today he says " I'm doing a lot better...just waiting to get into [facility]". A He completes his daily assessment and on this he writes he deneis  SI today and he rates his depression, hopelessness and anxiety " 4/4/8", respectively. R Safety in place and Clinical research associatewriter offered support and pos encouragement.

## 2017-02-18 NOTE — Progress Notes (Signed)
Patient attended wrap-up group and said that his day was a 7. He was excited because he was able to contact his lawyer.

## 2017-02-19 NOTE — Progress Notes (Signed)
[facility] MD Progress Note  02/19/2017 3:58 PM Daniel Arroyo  MRN:  [id number]   Subjective: Im doing pretty well. Im working with the doctor, Child psychotherapist, attorney and [facility] to get me some stability and placement. I think Im leaving Monday so Im good I have no complaints. I might have mentioned going home but Im ok until Monday. Im going to groups and havent fel this good in a long time.   Per nursing:  Daniel Arroyo is progressing well. HE is seen dressed in his clean street clothes. He stays in the dayroom...interacting with his peers . He is observed talking animatedly to other patients and staff thorughout the day and when writer approaches him and asks hm how he is doing today he says " I'm doing a lot better...just waiting to get into [facility]". A He completes his daily assessment and on this he writes he deneis  SI today and he rates his depression, hopelessness and anxiety " 4/4/8", respectively. R Safety in place and writer offered support and pos encouragement  Objective: Patient  has been compliant with his medication and inpatient psychiatric program including milieu therapy and group therapy. Patient  has a disturbance of sleep and appetite. Patient has rates depression  3/10 and anxiety 2/10 and denies suicidal ideation without intention or plan at this time. Patient stated that he is feeling safer in the hospital contract for safety while in the hospital.  Principal Problem: Alcohol dependence with alcohol-induced mood disorder (HCC) Diagnosis:   Patient Active Problem List   Diagnosis Date Noted  . Alcohol dependence with alcohol-induced mood disorder (HCC) [F10.24]   . Major depressive disorder, recurrent (HCC) [F33.9] 07/17/2016  . Alcohol-induced mood disorder (HCC) [F10.94] 06/10/2016  . Cocaine dependence (HCC) [F14.20] 04/23/2014  . Substance induced mood disorder (HCC) [F19.94] 04/23/2014  . MDD (major depressive disorder) (HCC) [F32.9] 04/22/2014  . Alcohol use disorder, severe,  dependence (HCC) [F10.20] 12/14/2013  . Major depressive disorder, recurrent severe without psychotic features (HCC) [F33.2] 12/14/2013  . Adult ADHD [F90.9] 12/14/2013  . Polysubstance abuse [F19.10] 12/13/2013  . Alcohol withdrawal (HCC) [F10.239] 10/28/2013   Total Time spent with patient: 15 minutes  Past Psychiatric History: see H&P  Past Medical History:  Past Medical History:  Diagnosis Date  . ADHD, adult residual type   . Anxiety   . Anxiety and depression   . Bipolar disorder (HCC)   . Depression   . Gastroesophageal reflux disease   . Knee pain   . Polysubstance abuse     Past Surgical History:  Procedure Laterality Date  . TONSILLECTOMY     Family History: History reviewed. No pertinent family history.  Family Psychiatric  History: See H&P  Social History:  History  Alcohol Use  . Yes    Comment: in rehab      History  Drug Use  . Types: Heroin, Marijuana    Comment: in rehab    Social History   Social History  . Marital status: Divorced    Spouse name: N/A  . Number of children: N/A  . Years of education: N/A   Social History Main Topics  . Smoking status: Former Smoker    Packs/day: 1.00  . Smokeless tobacco: Never Used  . Alcohol use Yes     Comment: in rehab   . Drug use: Yes    Types: Heroin, Marijuana     Comment: in rehab  . Sexual activity: Not Asked   Other Topics Concern  . None  Social History Narrative  . None   Additional Social History:   Sleep: Improved  Appetite:  Fair  Current Medications: Current Facility-Administered Medications  Medication Dose Route Frequency Provider Last Rate Last Dose  . acetaminophen (TYLENOL) tablet 650 mg  650 mg Oral Q6H PRN Charm RingsLord, Jamison Y, NP   650 mg at 02/16/17 1712  . alum & mag hydroxide-simeth (MAALOX/MYLANTA) 200-200-20 MG/5ML suspension 30 mL  30 mL Oral Q4H PRN Charm RingsLord, Jamison Y, NP      . dicyclomine (BENTYL) tablet 20 mg  20 mg Oral Q6H PRN Armandina StammerNwoko, Agnes I, NP   20 mg at  02/16/17 2221  . folic acid (FOLVITE) tablet 1 mg  1 mg Oral Daily Charm RingsLord, Jamison Y, NP   1 mg at 02/19/17 0858  . magnesium hydroxide (MILK OF MAGNESIA) suspension 30 mL  30 mL Oral Daily PRN Charm RingsLord, Jamison Y, NP      . mirtazapine (REMERON) tablet 15 mg  15 mg Oral QHS Beau FannyWithrow, [name], FNP   15 mg at 02/18/17 2252  . multivitamin with minerals tablet 1 tablet  1 tablet Oral Daily Armandina StammerNwoko, Agnes I, NP   1 tablet at 02/19/17 0858  . nicotine (NICODERM CQ - dosed in mg/24 hours) patch 21 mg  21 mg Transdermal Daily Nira ConnBerry, Jason A, NP   21 mg at 02/19/17 0900  . QUEtiapine (SEROQUEL) tablet 25 mg  25 mg Oral BID Adonis BrookAgustin, Sheila, NP   25 mg at 02/19/17 0858  . QUEtiapine (SEROQUEL) tablet 50 mg  50 mg Oral QHS Adonis BrookAgustin, Sheila, NP   50 mg at 02/18/17 2252  . sertraline (ZOLOFT) tablet 50 mg  50 mg Oral Daily Burnard LeighEksir, Alexander Arya, MD   50 mg at 02/19/17 0857  . thiamine (VITAMIN B-1) tablet 100 mg  100 mg Oral Daily Charm RingsLord, Jamison Y, NP   100 mg at 02/19/17 16100858   Or  . thiamine (B-1) injection 100 mg  100 mg Intravenous Daily Charm RingsLord, Jamison Y, NP       Lab Results: No results found for this or any previous visit (from the past 48 hour(s)).  Blood Alcohol level:  Lab Results  Component Value Date   ETH 161 (H) 02/10/2017   ETH 202 (H) 07/17/2016   Metabolic Disorder Labs: Lab Results  Component Value Date   HGBA1C 4.9 12/14/2013   MPG 94 12/14/2013   No results found for: PROLACTIN No results found for: CHOL, TRIG, HDL, CHOLHDL, VLDL, LDLCALC  Physical Findings: AIMS: Facial and Oral Movements Muscles of Facial Expression: None, normal Lips and Perioral Area: None, normal Jaw: None, normal Tongue: None, normal,Extremity Movements Upper (arms, wrists, hands, fingers): None, normal Lower (legs, knees, ankles, toes): None, normal, Trunk Movements Neck, shoulders, hips: None, normal, Overall Severity Severity of abnormal movements (highest score from questions above): None,  normal Incapacitation due to abnormal movements: None, normal Patient's awareness of abnormal movements (rate only patient's report): No Awareness, Dental Status Current problems with teeth and/or dentures?: No Does patient usually wear dentures?: No  CIWA:  CIWA-Ar Total: 2 COWS:  COWS Total Score: 3  Musculoskeletal: Strength & Muscle Tone: within normal limits Gait & Station: normal Patient leans: N/A  Psychiatric Specialty Exam: Physical Exam  Nursing note and vitals reviewed. Constitutional: He is oriented to person, place, and time. He appears well-developed.  Cardiovascular: Normal rate.   Neurological: He is alert and oriented to person, place, and time.  Psychiatric: He has a normal mood and affect.  His behavior is normal.    Review of Systems  Constitutional: Negative.   HENT: Negative.   Eyes: Negative.   Respiratory: Negative.   Cardiovascular: Negative.   Gastrointestinal: Negative.  Negative for nausea.  Genitourinary: Negative.   Skin: Negative.   Neurological: Negative.   Endo/Heme/Allergies: Negative.   Psychiatric/Behavioral: Positive for depression ("Improving") and substance abuse ( UDS (+) for Opioid, Cocaine & ETOH). Negative for hallucinations, memory loss and suicidal ideas. The patient is nervous/anxious and has insomnia.   All other systems reviewed and are negative.   Blood pressure (!) 137/93, pulse 80, temperature 97.6 F (36.4 C), temperature source Oral, resp. rate 16, height 6' (1.829 m), weight 79.4 kg (175 lb), SpO2 100 %.Body mass index is 23.73 kg/m.  General Appearance: Casual  Eye Contact:  Fair  Speech:  Clear and Coherent  Volume:  Normal  Mood:  Euthymic  Affect:  Appropriate and Congruent  Thought Process:  Coherent and Linear  Orientation:  Full (Time, Place, and Person)  Thought Content: Continues to  focused on treatment options  Suicidal Thoughts:  No  Homicidal Thoughts:  No  Memory:  Immediate;   Good Recent;    Good Remote;   Good  Judgement:  Fair  Insight:  Fair  Psychomotor Activity:  Normal  Concentration:  Concentration: Fair and Attention Span: Fair  Recall:  [employer] of Knowledge:  Fair  Language:  Fair  Akathisia:  No  Handed:    AIMS (if indicated):     Assets:  Desire for Improvement Resilience Social Support  ADL's:  Intact  Cognition:  WNL  Sleep:  Number of Hours: 6.25   Treatment Plan Summary:  Reviewed and will cont 02/19/2017 Alcohol dependence with alcohol-induced mood disorder (HCC) with MDD, recurrent, severe, without psychosis, both unstable, managed as below:  Medications:  - Zoloft 50 mg daily, increase to 100 mg by sunday -Continue  Remeron 15 mg po qhs for MDD, insomnia -Continue Nicotine patch for smoking cessation. -Completed Clonidine detox protocol. Will continue to monitor vitals ,medication compliance and treatment side effects while patient is here.  Reviewed labs,BAL - 161, UDS - pos for cocaine and benzodizpines. CSW will start working on disposition.  Patient to participate in therapeutic milieu  Truman Hayward,  02/19/2017 3:58 PMPatient ID: Daniel Arroyo, male   DOB: Aug 03, 1971, 46 y.o.   MRN: [id number]

## 2017-02-19 NOTE — Progress Notes (Signed)
Patient ID: Gaspar BiddingRonnie Ehmann, male   DOB: [birth year], 46 y.o.   MRN: [id number]  Pt currently presents with a blunted affect and guarded behavior. Pt reports to writer that their goal is to "go home soon." Pt states "the medicines are working now." Pt reports good sleep with current medication regimen. Pt currently denies any withdrawal symptoms, reports cravings 6/10. Complains of lower back pain and cramping with extended periods of sitting.   Pt provided with medications per providers orders. Pt's labs and vitals were monitored throughout the night. Pt given a 1:1 about emotional and mental status. Pt supported and encouraged to express concerns and questions. Pt educated on medications and alternative pain relief techniques.   Pt's safety ensured with 15 minute and environmental checks. Pt currently denies SI/HI and A/V hallucinations. Pt verbally agrees to seek staff if SI/HI or A/VH occurs and to consult with staff before acting on any harmful thoughts. Will continue POC.

## 2017-02-19 NOTE — [facility] Group Notes (Signed)
Adult Therapy Group Note (Clinical Social Work)  Date:  02/19/2017  Time:  10:00-11:00AM  Group Topic/Focus:  HEALTHY COPING SKILLS  Today's group focused on identifying healthy coping skills already in use by each patient, as well as healthy coping skills they would like to learn.  There was much sharing, support, psychoeducation, and encouragement provided.  Participation Level:  Active  Participation Quality:  Attentive, Sharing and Supportive  Affect:  Appropriate  Cognitive:  Appropriate  Insight: Good  Engagement in Group:  Developing/Improving  Modes of Intervention:  Discussion, Support and Processing  Additional Comments:  The patient shared that healthy coping already used is abiding by a routine or schedule, because that helps him tremendously.  He wants to keep working on balance in his life, because he tends to get overly focused on just 1 thing and that can become detrimental to other important areas of his life.  He said that positive affirmations are "cheesy" and not helpful for him.  He gave feedback to other people at various times.  He said one of the most beneficial things for him is to help someone spontaneously, and it was suggested that he plan some of those acts of kindness, not necessarily through consistent volunteering to which he was adamantly opposed, but through making up his mind that he will do one random act of kindness daily.  He liked that idea a great deal.  Lynnell ChadMareida J Grossman-Orr 02/19/2017, 1:28 PM

## 2017-02-19 NOTE — Progress Notes (Addendum)
Patient attended group and said that his day was a 8.  His coping skills for today were reading and playing basketball.

## 2017-02-19 NOTE — Progress Notes (Signed)
D: A & O X4. Denies SI, HI, AVH and pain when assessed. Presents animated but anxious. Per pt "I need to work on getting out of here, [facility]". Rates his depression 6/10, hopelessness 6/10 and anxiety 6/10. Reports he's sleeping well with fair appetite but low  Energy and poor concentration level.  A: Medications administered as per MD's orders and effects monitored. Support and availability and encouragement offered to pt. Q 15 minutes safety checks maintained without self harm gestures or outburst to note at this time.  R: Pt remains medication compliant. Denies adverse drug reactions when assessed. Attended scheduled unit groups. Went off unit for activities and meals with peers; returned without issues. Remains safe on unit. Denies concerns at present.

## 2017-02-19 NOTE — [facility] Group Notes (Signed)
Identifying Needs   Date:  02/19/2017  Time:  1300  Type of Therapy:  Nurse Education  / Group focuses on teaching patients how to identify their needs and then how to develop skills needed to get them met.  Participation Level:  Active  Participation Quality:  Attentive  Affect:  Appropriate  Cognitive:  Alert  Insight:  Good  Engagement in Group:  Engaged  Modes of Intervention:  Education  Summary of Progress/Problems:  Lauralyn Primes 02/19/2017, 6:13 PM

## 2017-02-20 NOTE — [facility] Group Notes (Signed)
[facility] Group Notes:  (Clinical Social Work)   02/20/2017    1:15-2:15PM  Summary of Progress/Problems:   The main focus of today's process group was to   1)  discuss the importance of adding supports  2)  define health supports versus unhealthy supports  4)  identify the patient's current healthy supports and  4)  plan what healthy supports to add.  An emphasis was placed on using counselor, doctor, therapy groups, 12-step groups, and problem-specific support groups to expand supports.    The patient expressed full comprehension of the concepts presented, and agreed that there is a need to add more supports.  The patient stated his current healthy supports include some friends, and he needs to add more friends that are similar in nature, "in the network," specifically Narcotics Anonymous.  He also plans to get a sponsor.  He expressed eagerness to leave.  Type of Therapy:  Process Group with Motivational Interviewing  Participation Level:  Minimal  Participation Quality:  Attentive  Affect:  Appropriate  Cognitive:  Appropriate  Insight:  Developing/Improving  Engagement in Therapy:  Engaged  Modes of Intervention:   Education, Support and Processing  Ambrose MantleMareida Grossman-Orr, LCSW 02/20/2017    4:31 PM

## 2017-02-20 NOTE — Progress Notes (Signed)
[name] is seen laughing, joking and interacting with his peers in the dayroom.He is wearing  Clean clothes, he sits in the dayroom and watches TV and he says " I feel so much better". A He completed his daily assessment and on it he wrote he deneid SI and he rated his depression, hopelessness and anxeity " 6/6/6", respectively. R POC includes dc soon pending legal disposition.

## 2017-02-20 NOTE — Progress Notes (Signed)
[id number] [facility]  02/20/2017 1:58 PM Gaspar BiddingRonnie Eden  MRN:  [id number]   Subjective:Im staying more engaged. Im hoping to hear from the doctor tomorrow about my discharge and placement. I want to go to [facility] so we will see what they say. I know my depression is getting better.Its starting to feel like I can take steps to move forward.    Per nursing:  Pt currently presents with a blunted affect and guarded behavior. Pt reports to writer that their goal is to "go home soon." Pt states "the medicines are working now." Pt reports good sleep with current medication regimen. Pt currently denies any withdrawal symptoms, reports cravings 6/10. Complains of lower back pain and cramping with extended periods of sitting.   Objective: Patient  has been compliant with his medication and inpatient psychiatric program including milieu therapy and group therapy. Patient  has a disturbance of sleep and appetite. Patient has rates depression  3/10 and anxiety 2/10 and denies suicidal ideation without intention or plan at this time. Patient stated that he is feeling safer in the hospital contract for safety while in the hospital.  Principal Problem: Alcohol dependence with alcohol-induced mood disorder (HCC) Diagnosis:   Patient Active Problem List   Diagnosis Date Noted  . Alcohol dependence with alcohol-induced mood disorder (HCC) [F10.24]   . Major depressive disorder, recurrent (HCC) [F33.9] 07/17/2016  . Alcohol-induced mood disorder (HCC) [F10.94] 06/10/2016  . Cocaine dependence (HCC) [F14.20] 04/23/2014  . Substance induced mood disorder (HCC) [F19.94] 04/23/2014  . MDD (major depressive disorder) (HCC) [F32.9] 04/22/2014  . Alcohol use disorder, severe, dependence (HCC) [F10.20] 12/14/2013  . Major depressive disorder, recurrent severe without psychotic features (HCC) [F33.2] 12/14/2013  . Adult ADHD [F90.9] 12/14/2013  . Polysubstance abuse [F19.10] 12/13/2013  . Alcohol withdrawal (HCC) [F10.239]  10/28/2013   Total Time spent with patient: 15 minutes  Past Psychiatric History: see H&P  Past Medical History:  Past Medical History:  Diagnosis Date  . ADHD, adult residual type   . Anxiety   . Anxiety and depression   . Bipolar disorder (HCC)   . Depression   . Gastroesophageal reflux disease   . Knee pain   . Polysubstance abuse     Past Surgical History:  Procedure Laterality Date  . TONSILLECTOMY     Family History: History reviewed. No pertinent family history.  Family Psychiatric  History: See H&P  Social History:  History  Alcohol Use  . Yes    Comment: in rehab      History  Drug Use  . Types: Heroin, Marijuana    Comment: in rehab    Social History   Social History  . Marital status: Divorced    Spouse name: N/A  . Number of children: N/A  . Years of education: N/A   Social History Main Topics  . Smoking status: Former Smoker    Packs/day: 1.00  . Smokeless tobacco: Never Used  . Alcohol use Yes     Comment: in rehab   . Drug use: Yes    Types: Heroin, Marijuana     Comment: in rehab  . Sexual activity: Not Asked   Other Topics Concern  . None   Social History Narrative  . None   Additional Social History:   Sleep: Improved  Appetite:  Fair  Current Medications: Current Facility-Administered Medications  Medication Dose Route Frequency Provider Last Rate Last Dose  . acetaminophen (TYLENOL) tablet 650 mg  650 mg Oral Q6H  PRN Charm Rings, NP   650 mg at 02/16/17 1712  . alum & mag hydroxide-simeth (MAALOX/MYLANTA) 200-200-20 MG/5ML suspension 30 mL  30 mL Oral Q4H PRN Charm Rings, NP      . dicyclomine (BENTYL) tablet 20 mg  20 mg Oral Q6H PRN Armandina Stammer I, NP   20 mg at 02/19/17 2157  . folic acid (FOLVITE) tablet 1 mg  1 mg Oral Daily Charm Rings, NP   1 mg at 02/20/17 0844  . magnesium hydroxide (MILK OF MAGNESIA) suspension 30 mL  30 mL Oral Daily PRN Charm Rings, NP      . mirtazapine (REMERON) tablet 15 mg   15 mg Oral QHS Beau Fanny, FNP   15 mg at 02/19/17 2154  . multivitamin with minerals tablet 1 tablet  1 tablet Oral Daily Armandina Stammer I, NP   1 tablet at 02/20/17 0800  . nicotine (NICODERM CQ - dosed in mg/24 hours) patch 21 mg  21 mg Transdermal Daily Nira Conn A, NP   21 mg at 02/20/17 0801  . QUEtiapine (SEROQUEL) tablet 25 mg  25 mg Oral BID Adonis Brook, NP   25 mg at 02/20/17 0800  . QUEtiapine (SEROQUEL) tablet 50 mg  50 mg Oral QHS Adonis Brook, NP   50 mg at 02/19/17 2154  . sertraline (ZOLOFT) tablet 50 mg  50 mg Oral Daily Burnard Leigh, MD   50 mg at 02/20/17 0800  . thiamine (VITAMIN B-1) tablet 100 mg  100 mg Oral Daily Charm Rings, NP   100 mg at 02/20/17 0800   Or  . thiamine (B-1) injection 100 mg  100 mg Intravenous Daily Charm Rings, NP       Lab Results: No results found for this or any previous visit (from the past 48 hour(s)).  Blood Alcohol level:  Lab Results  Component Value Date   ETH 161 (H) 02/10/2017   ETH 202 (H) 07/17/2016   Metabolic Disorder Labs: Lab Results  Component Value Date   HGBA1C 4.9 12/14/2013   MPG 94 12/14/2013   No results found for: PROLACTIN No results found for: CHOL, TRIG, HDL, CHOLHDL, VLDL, LDLCALC  Physical Findings: AIMS: Facial and Oral Movements Muscles of Facial Expression: None, normal Lips and Perioral Area: None, normal Jaw: None, normal Tongue: None, normal,Extremity Movements Upper (arms, wrists, hands, fingers): None, normal Lower (legs, knees, ankles, toes): None, normal, Trunk Movements Neck, shoulders, hips: None, normal, Overall Severity Severity of abnormal movements (highest score from questions above): None, normal Incapacitation due to abnormal movements: None, normal Patient's awareness of abnormal movements (rate only patient's report): No Awareness, Dental Status Current problems with teeth and/or dentures?: No Does patient usually wear dentures?: No  CIWA:  CIWA-Ar  Total: 2 COWS:  COWS Total Score: 3  Musculoskeletal: Strength & Muscle Tone: within normal limits Gait & Station: normal Patient leans: N/A  Psychiatric Specialty Exam: Physical Exam  Nursing note and vitals reviewed. Constitutional: He is oriented to person, place, and time. He appears well-developed.  Cardiovascular: Normal rate.   Neurological: He is alert and oriented to person, place, and time.  Psychiatric: He has a normal mood and affect. His behavior is normal.    Review of Systems  Constitutional: Negative.   HENT: Negative.   Eyes: Negative.   Respiratory: Negative.   Cardiovascular: Negative.   Gastrointestinal: Negative.  Negative for nausea.  Genitourinary: Negative.   Skin: Negative.   Neurological:  Negative.   Endo/Heme/Allergies: Negative.   Psychiatric/Behavioral: Positive for depression ("Improving") and substance abuse ( UDS (+) for Opioid, Cocaine & ETOH). Negative for hallucinations, memory loss and suicidal ideas. The patient is nervous/anxious and has insomnia.   All other systems reviewed and are negative.   Blood pressure 129/83, pulse 69, temperature 98.2 F (36.8 C), resp. rate 16, height 6' (1.829 m), weight 79.4 kg (175 lb), SpO2 100 %.Body mass index is 23.73 kg/m.  General Appearance: Casual  Eye Contact:  Fair  Speech:  Clear and Coherent  Volume:  Normal  Mood:  Euthymic  Affect:  Appropriate and Congruent  Thought Process:  Coherent and Linear  Orientation:  Full (Time, Place, and Person)  Thought Content: Continues to  focused on treatment options  Suicidal Thoughts:  No  Homicidal Thoughts:  No  Memory:  Immediate;   Good Recent;   Good Remote;   Good  Judgement:  Fair  Insight:  Fair  Psychomotor Activity:  Normal  Concentration:  Concentration: Fair and Attention Span: Fair  Recall:  [employer] of Knowledge:  Fair  Language:  Fair  Akathisia:  No  Handed:    AIMS (if indicated):     Assets:  Desire for  Improvement Resilience Social Support  ADL's:  Intact  Cognition:  WNL  Sleep:  Number of Hours: 6.5   Treatment Plan Summary:  Reviewed and will cont 02/20/2017 Alcohol dependence with alcohol-induced mood disorder (HCC) with MDD, recurrent, severe, without psychosis, both unstable, managed as below:  Medications:  - Zoloft 50 mg daily, increase to 100 mg by sunday -Continue  Remeron 15 mg po qhs for MDD, insomnia -Continue Nicotine patch for smoking cessation. -Completed Clonidine detox protocol. Will continue to monitor vitals ,medication compliance and treatment side effects while patient is here.  Reviewed labs,BAL - 161, UDS - pos for cocaine and benzodizpines. CSW will start working on disposition.  Patient to participate in therapeutic milieu  Truman Hayward,  02/20/2017 1:58 PMPatient ID: [name], male   DOB: September 04, 1971, 46 y.o.   MRN: [id number]

## 2017-02-21 MED ORDER — FOLIC ACID 1 MG PO TABS: 1.0000 mg | ORAL_TABLET | Freq: Every day | ORAL | 0 refills | Status: DC

## 2017-02-21 MED ORDER — MIRTAZAPINE 15 MG PO TABS: 15.0000 mg | ORAL_TABLET | Freq: Every day | ORAL | 0 refills | Status: DC

## 2017-02-21 MED ORDER — QUETIAPINE FUMARATE 25 MG PO TABS: 25.0000 mg | ORAL_TABLET | Freq: Two times a day (BID) | ORAL | 0 refills | Status: DC

## 2017-02-21 MED ORDER — SERTRALINE HCL 50 MG PO TABS: 50.0000 mg | ORAL_TABLET | Freq: Every day | ORAL | 0 refills | Status: DC

## 2017-02-21 NOTE — Discharge Summary (Signed)
Physician Discharge Summary Note  Patient:  Daniel Arroyo is an 46 y.o., male  MRN:  [id number]  DOB:  Nov 25, 1970  Patient phone:  [phone] (home)   Patient address:   [address] [place] [address],   Total Time spent with patient: Greater than 30 minutes  Date of Admission:  02/11/2017 Date of Discharge: 02/22/2017  Reason for Admission:PER H&P- This is an admission assessment for this 46 year old Caucasian male known to our unit for polysubstance use disorder. Admitted to the [facility] from the [facility] with complaints of worsening drugs & alcohol use. Patient is seeking detoxification treatments & a referral to [facility] because he says has heard a lot of good things about their program. During this assessment, Daniel Arroyo reports, "I went to the [facility] on Thursday afternoon. My drinking & drug use were out of control. I lost my job & place to live because of it. I was living in an [place] house, has to leave on my own because I was not doing the right thing. I cannot stop drinking or using. When ever I tried to stop, I will go crazy in my freaking mind. I have been using & drinking heavily x 6 months.. I feel like I have lost everything. Please, I will need medicines for alcohol & opioid cravings & I would like to get into [facility] after discharge. I'm so mad at myself that I feel like ending it all".   Principal Problem: Alcohol dependence with alcohol-induced mood disorder Daniel Arroyo)  Discharge Diagnoses: Patient Active Problem List   Diagnosis Date Noted  . Alcohol dependence with alcohol-induced mood disorder (HCC) [F10.24]   . Major depressive disorder, recurrent (HCC) [F33.9] 07/17/2016  . Alcohol-induced mood disorder (HCC) [F10.94] 06/10/2016  . Cocaine dependence (HCC) [F14.20] 04/23/2014  . Substance induced mood disorder (HCC) [F19.94] 04/23/2014  . MDD (major depressive disorder) (HCC) [F32.9] 04/22/2014  . Alcohol use disorder, severe, dependence  (HCC) [F10.20] 12/14/2013  . Major depressive disorder, recurrent severe without psychotic features (HCC) [F33.2] 12/14/2013  . Adult ADHD [F90.9] 12/14/2013  . Polysubstance abuse [F19.10] 12/13/2013  . Alcohol withdrawal (HCC) [F10.239] 10/28/2013   Past Psychiatric History: Hx. Alcohol use disorder, Cocaine use disorder, T  Past Medical History:  Past Medical History:  Diagnosis Date  . ADHD, adult residual type   . Anxiety   . Anxiety and depression   . Bipolar disorder (HCC)   . Depression   . Gastroesophageal reflux disease   . Knee pain   . Polysubstance abuse     Past Surgical History:  Procedure Laterality Date  . TONSILLECTOMY     Family History: History reviewed. No pertinent family history.  Family Psychiatric  History: See H&P  Social History:  History  Alcohol Use  . Yes    Comment: in rehab      History  Drug Use  . Types: Heroin, Marijuana    Comment: in rehab    Social History   Social History  . Marital status: Divorced    Spouse name: N/A  . Number of children: N/A  . Years of education: N/A   Social History Main Topics  . Smoking status: Former Smoker    Packs/day: 1.00  . Smokeless tobacco: Never Used  . Alcohol use Yes     Comment: in rehab   . Drug use: Yes    Types: Heroin, Marijuana     Comment: in rehab  . Sexual activity: Not Asked  Other Topics Concern  . None   Social History Narrative  . None   Hospital Course:  Daniel Arroyo was admitted for Alcohol dependence with alcohol-induced mood disorder (HCC)  and crisis management.  Pt was treated discharged with the medications listed below under Medication List.  Medical problems were identified and treated as needed.  Home medications were restarted as appropriate.  Improvement was monitored by observation and Daniel Arroyo 's daily report of symptom reduction.  Emotional and mental status was monitored by daily self-inventory reports completed by [facility] and  clinical staff.         Daniel Arroyo was evaluated by the treatment team for stability and plans for continued recovery upon discharge. Daniel Arroyo 's motivation was an integral factor for scheduling further treatment. Employment, transportation, bed availability, health status, family support, and any pending legal issues were also considered during hospital stay. Pt was offered further treatment options upon discharge including but not limited to Residential, Intensive Outpatient, and Outpatient treatment.  Daniel Arroyo will follow up with the services as listed below under Follow Up Information.     Upon completion of this admission the patient was both mentally and medically stable for discharge denying suicidal/homicidal ideation, auditory/visual/tactile hallucinations, delusional thoughts and paranoia.  Daniel Arroyo symptoms responded well to his treatment regimen without  adverse effects.  Pt demonstrated improvement without reported or observed adverse effects to the point of stability appropriate for outpatient medication management & a residential treatment program for his substance abuse issues as noted below.  Reviewed pertinent lab values.   Physical Findings: AIMS: Facial and Oral Movements Muscles of Facial Expression: None, normal Lips and Perioral Area: None, normal Jaw: None, normal Tongue: None, normal,Extremity Movements Upper (arms, wrists, hands, fingers): None, normal Lower (legs, knees, ankles, toes): None, normal, Trunk Movements Neck, shoulders, hips: None, normal, Overall Severity Severity of abnormal movements (highest score from questions above): None, normal Incapacitation due to abnormal movements: None, normal Patient's awareness of abnormal movements (rate only patient's report): No Awareness, Dental Status Current problems with teeth and/or dentures?: No Does patient usually wear dentures?: No  CIWA:  CIWA-Ar Total: 2 COWS:  COWS Total Score:  3  Musculoskeletal: Strength & Muscle Tone: within normal limits Gait & Station: normal Patient leans: N/A  Psychiatric Specialty Exam: See SRA by MD Physical Exam  Constitutional: He is oriented to person, place, and time. He appears well-developed.  HENT:  Head: Normocephalic.  Eyes: Pupils are equal, round, and reactive to light.  Neck: Normal range of motion.  Cardiovascular: Normal rate.   Respiratory: Effort normal.  GI: Soft.  Genitourinary:  Genitourinary Comments: Deferred  Musculoskeletal: Normal range of motion.  Neurological: He is alert and oriented to person, place, and time.  Skin: Skin is warm and dry.    Review of Systems  Constitutional: Negative.   HENT: Negative.   Eyes: Negative.   Respiratory: Negative.   Cardiovascular: Negative.   Gastrointestinal: Negative.   Genitourinary: Negative.   Musculoskeletal: Negative.   Skin: Negative.   Neurological: Negative.   Endo/Heme/Allergies: Negative.   Psychiatric/Behavioral: Positive for depression (Stable) and substance abuse (Hx. Alcohol, Benzodiazepine & THC use disorder). Negative for suicidal ideas.    Blood pressure 120/85, pulse 75, temperature 98.3 F (36.8 C), temperature source Oral, resp. rate 18, height 6' (1.829 m), weight 79.4 kg (175 lb), SpO2 100 %.Body mass index is 23.73 kg/m.   Have you used any form of tobacco in the last 30 days? (  Cigarettes, Smokeless Tobacco, Cigars, and/or Pipes): Yes  Has this patient used any form of tobacco in the last 30 days? (Cigarettes, Smokeless Tobacco, Cigars, and/or Pipes) No  Blood Alcohol level:  Lab Results  Component Value Date   ETH 161 (H) 02/10/2017   ETH 202 (H) 07/17/2016   Metabolic Disorder Labs:  Lab Results  Component Value Date   HGBA1C 4.9 12/14/2013   MPG 94 12/14/2013   No results found for: PROLACTIN No results found for: CHOL, TRIG, HDL, CHOLHDL, VLDL, LDLCALC  See Psychiatric Specialty Exam and Suicide Risk Assessment  completed by Attending Physician prior to discharge.  Discharge destination:  [facility]  Is patient on multiple antipsychotic therapies at discharge:  No   Has Patient had three or more failed trials of antipsychotic monotherapy by history:  No  Recommended Plan for Multiple Antipsychotic Therapies: NA  Discharge Instructions    Diet - low sodium heart healthy    Complete by:  As directed    Discharge instructions    Complete by:  As directed    Take all medications as prescribed. Keep all follow-up appointments as scheduled.  Do not consume alcohol or use illegal drugs while on prescription medications. Report any adverse effects from your medications to your primary care provider promptly.  In the event of recurrent symptoms or worsening symptoms, call 911, a crisis hotline, or go to the nearest emergency department for evaluation.   Increase activity slowly    Complete by:  As directed      Allergies as of 02/21/2017   No Known Allergies     Medication List    STOP taking these medications   cetirizine 10 MG tablet Commonly known as:  ZYRTEC   gabapentin 300 MG capsule Commonly known as:  NEURONTIN   hydrOXYzine 25 MG tablet Commonly known as:  ATARAX/VISTARIL   naltrexone 50 MG tablet Commonly known as:  DEPADE   nicotine 21 mg/24hr patch Commonly known as:  NICODERM CQ - dosed in mg/24 hours   pantoprazole 20 MG tablet Commonly known as:  PROTONIX   thiamine 100 MG tablet     TAKE these medications     Indication  fluticasone 50 MCG/ACT nasal spray Commonly known as:  FLONASE Place 2 sprays into both nostrils daily.  Indication:  Allergic Rhinitis   folic acid 1 MG tablet Commonly known as:  FOLVITE Take 1 tablet (1 mg total) by mouth daily. Start taking on:  02/22/2017  Indication:  mutiv   mirtazapine 15 MG tablet Commonly known as:  REMERON Take 1 tablet (15 mg total) by mouth at bedtime.  Indication:  Major Depressive Disorder   QUEtiapine 25 MG  tablet Commonly known as:  SEROQUEL Take 1 tablet (25 mg total) by mouth 2 (two) times daily. Take 2 tables (50mg ) by mouth at bedtime  Indication:  racing thoughts, agitation   sertraline 50 MG tablet Commonly known as:  ZOLOFT Take 1 tablet (50 mg total) by mouth daily. Start taking on:  02/22/2017  Indication:  Major Depressive Disorder      Follow-up Information    [facility] Follow up.   Why:  Social worker has made a referral on your behalf. Please continue to call to check for bed availablity. Thank you. Contact information: [address] [place], [place] [address] P: [phone] F: [phone]       Services, Alcohol And Drug Follow up.   Specialty:  Behavioral Health Why:  Please go for a walk-in appointment  within 1-3 days of discharge to be assessed for IOP services. Walk-in hours are Mon, Wed, Fri 12pm-3pm. Thank you. Contact information: [address] [place] [address] [phone]          Follow-up recommendations: Activity:  As tolerated Diet: As recommended by your primary care doctor. Keep all scheduled follow-up appointments as recommended.   Comments: Patient is instructed prior to discharge to: Take all medications as prescribed by his/her mental healthcare provider. Report any adverse effects and or reactions from the medicines to his/her outpatient provider promptly. Patient has been instructed & cautioned: To not engage in alcohol and or illegal drug use while on prescription medicines. In the event of worsening symptoms, patient is instructed to call the crisis hotline, 911 and or go to the nearest ED for appropriate evaluation and treatment of symptoms. To follow-up with his/her primary care provider for your other medical issues, concerns and or health care needs.    Signed: Sanjuana KavaAgnes I. Nwoko, PMHNP, FNP-BC. 02/21/2017, 4:22 PM   Patient seen, Suicide Assessment Completed.  Disposition Plan Reviewed

## 2017-02-21 NOTE — [facility] Group Notes (Signed)
[facility] Planning [facility] Note   02/21/2017 1:36 PM  Participation Quality:  Appropriate  Mood/Affect:  Appropriate  Plan for Discharge/Comments:  [facility] concerned that lawyers office has not called w continuance letter, asked that CSW contact public defender to discuss.  Transportation Means: faciliity transport  Supports: Clinical research associatelawyer and facility  Sallee LangeAnne C Josmar Messimer

## 2017-02-21 NOTE — Progress Notes (Signed)
Recreation Therapy Notes  Date: 02/21/17 Time: 0930 Location: [address] Group Room  Group Topic: Stress Management  Goal Area(s) Addresses:  Patient will verbalize importance of using healthy stress management.  Patient will identify positive emotions associated with healthy stress management.   Intervention: Stress Management  Activity :  LRT introduced the stress management technique of meditation.  LRT played [place] meditation on gratitude to allow patients to engage in the meditation.  Patients were to follow along as the meditation played to fully participate in the activity.   Education:  Stress Management, Discharge Planning.   Education Outcome: Acknowledges edcuation/In group clarification offered/Needs additional education  Clinical Observations/Feedback: Pt did not attend group.   Lind Ausley, LRT/CTRS         Daniel Arroyo 02/21/2017 11:41 AM 

## 2017-02-21 NOTE — Progress Notes (Signed)
[facility] MD Progress Note  02/21/2017 5:50 PM Daniel Arroyo  MRN:  [id number]   Subjective:patient states he is feeling better. States that at this time he feels he is over the alcohol withdrawal. Denies medication side effects. He is future oriented, and is planning on going to [facility] rehab at discharge. Denies medication side effects.  Objective:  I have discussed case with treatment team and have met with patient. He is a 46 year old male,reports history of polysubstance dependence,identifies alcohol as substance of choice . Admission BAL 161. At this time reports he is feeling better, and denies current symptoms of significant alcohol withdrawal. Presents calm, no tremors or diaphoresis are noted, vitals are stable. Future oriented, and motivated in going to a rehab at discharge [place]. Minimizes depression at this time and states " I am excited that I have been accepted, everything is falling into place". No disruptive or agitated behaviors .    Principal Problem: Alcohol dependence with alcohol-induced mood disorder ([place]) Diagnosis:   Patient Active Problem List   Diagnosis Date Noted  . Alcohol dependence with alcohol-induced mood disorder ([place]) [F10.24]   . Major depressive disorder, recurrent ([place]) [F33.9] 07/17/2016  . Alcohol-induced mood disorder ([place]) [F10.94] 06/10/2016  . Cocaine dependence ([place]) [F14.20] 04/23/2014  . Substance induced mood disorder ([place]) [F19.94] 04/23/2014  . MDD (major depressive disorder) ([place]) [F32.9] 04/22/2014  . Alcohol use disorder, severe, dependence ([place]) [F10.20] 12/14/2013  . Major depressive disorder, recurrent severe without psychotic features ([place]) [F33.2] 12/14/2013  . Adult ADHD [F90.9] 12/14/2013  . Polysubstance abuse [F19.10] 12/13/2013  . Alcohol withdrawal ([place]) [F10.239] 10/28/2013   Total Time spent with patient: 20 minutes  Past Psychiatric History: see H&P  Past Medical History:  Past Medical History:  Diagnosis Date  . ADHD,  adult residual type   . Anxiety   . Anxiety and depression   . Bipolar disorder ([place])   . Depression   . Gastroesophageal reflux disease   . Knee pain   . Polysubstance abuse     Past Surgical History:  Procedure Laterality Date  . TONSILLECTOMY     Family History: History reviewed. No pertinent family history.  Family Psychiatric  History: See H&P  Social History:  History  Alcohol Use  . Yes    Comment: in rehab      History  Drug Use  . Types: Heroin, Marijuana    Comment: in rehab    Social History   Social History  . Marital status: Divorced    Spouse name: N/A  . Number of children: N/A  . Years of education: N/A   Social History Main Topics  . Smoking status: Former Smoker    Packs/day: 1.00  . Smokeless tobacco: Never Used  . Alcohol use Yes     Comment: in rehab   . Drug use: Yes    Types: Heroin, Marijuana     Comment: in rehab  . Sexual activity: Not Asked   Other Topics Concern  . None   Social History Narrative  . None   Additional Social History:   Sleep: Good  Appetite:  Good  Current Medications: Current Facility-Administered Medications  Medication Dose Route Frequency Provider Last Rate Last Dose  . acetaminophen (TYLENOL) tablet 650 mg  650 mg Oral Q6H PRN Patrecia Pour, NP   650 mg at 02/16/17 1712  . alum & mag hydroxide-simeth (MAALOX/MYLANTA) 200-200-20 MG/5ML suspension 30 mL  30 mL Oral Q4H PRN Patrecia Pour, NP      .  folic acid (FOLVITE) tablet 1 mg  1 mg Oral Daily Patrecia Pour, NP   1 mg at 02/21/17 0804  . magnesium hydroxide (MILK OF MAGNESIA) suspension 30 mL  30 mL Oral Daily PRN Patrecia Pour, NP      . mirtazapine (REMERON) tablet 15 mg  15 mg Oral QHS Benjamine Mola, FNP   15 mg at 02/20/17 2240  . multivitamin with minerals tablet 1 tablet  1 tablet Oral Daily Lindell Spar I, NP   1 tablet at 02/21/17 0804  . nicotine (NICODERM CQ - dosed in mg/24 hours) patch 21 mg  21 mg Transdermal Daily Lindon Romp  A, NP   21 mg at 02/21/17 0807  . QUEtiapine (SEROQUEL) tablet 25 mg  25 mg Oral BID Kerrie Buffalo, NP   25 mg at 02/21/17 1647  . QUEtiapine (SEROQUEL) tablet 50 mg  50 mg Oral QHS Kerrie Buffalo, NP   50 mg at 02/20/17 2240  . sertraline (ZOLOFT) tablet 50 mg  50 mg Oral Daily Aundra Dubin, MD   50 mg at 02/21/17 0804  . thiamine (VITAMIN B-1) tablet 100 mg  100 mg Oral Daily Patrecia Pour, NP   100 mg at 02/21/17 0350   Or  . thiamine (B-1) injection 100 mg  100 mg Intravenous Daily Patrecia Pour, NP       Lab Results: No results found for this or any previous visit (from the past 48 hour(s)).  Blood Alcohol level:  Lab Results  Component Value Date   ETH 161 (H) 02/10/2017   ETH 202 (H) 09/38/1829   Metabolic Disorder Labs: Lab Results  Component Value Date   HGBA1C 4.9 12/14/2013   MPG 94 12/14/2013   No results found for: PROLACTIN No results found for: CHOL, TRIG, HDL, CHOLHDL, VLDL, LDLCALC  Physical Findings: AIMS: Facial and Oral Movements Muscles of Facial Expression: None, normal Lips and Perioral Area: None, normal Jaw: None, normal Tongue: None, normal,Extremity Movements Upper (arms, wrists, hands, fingers): None, normal Lower (legs, knees, ankles, toes): None, normal, Trunk Movements Neck, shoulders, hips: None, normal, Overall Severity Severity of abnormal movements (highest score from questions above): None, normal Incapacitation due to abnormal movements: None, normal Patient's awareness of abnormal movements (rate only patient's report): No Awareness, Dental Status Current problems with teeth and/or dentures?: No Does patient usually wear dentures?: No  CIWA:  CIWA-Ar Total: 2 COWS:  COWS Total Score: 3  Musculoskeletal: Strength & Muscle Tone: within normal limits- no tremors, no diaphoresis, no acute distress Gait & Station: normal Patient leans: N/A  Psychiatric Specialty Exam: Physical Exam  Nursing note and vitals  reviewed. Constitutional: He is oriented to person, place, and time. He appears well-developed.  Cardiovascular: Normal rate.   Neurological: He is alert and oriented to person, place, and time.  Psychiatric: He has a normal mood and affect. His behavior is normal.    Review of Systems  Constitutional: Negative.   HENT: Negative.   Eyes: Negative.   Respiratory: Negative.   Cardiovascular: Negative.   Gastrointestinal: Negative.  Negative for nausea.  Genitourinary: Negative.   Skin: Negative.   Neurological: Negative.   Endo/Heme/Allergies: Negative.   Psychiatric/Behavioral: Positive for depression ("Improving") and substance abuse ( UDS (+) for Opioid, Cocaine & ETOH). Negative for hallucinations, memory loss and suicidal ideas. The patient is nervous/anxious and has insomnia.   All other systems reviewed and are negative.   Blood pressure 120/85, pulse 75, temperature 98.3 F (  36.8 C), temperature source Oral, resp. rate 18, height 6' (1.829 m), weight 79.4 kg (175 lb), SpO2 100 %.Body mass index is 23.73 kg/m.  General Appearance: Fairly Groomed  Eye Contact:  Good  Speech:  Normal Rate  Volume:  Normal  Mood:  Euthymic and states mood is " good" today  Affect:  Appropriate and Full Range  Thought Process:  Linear and Descriptions of Associations: Intact  Orientation:  Other:  fully alert and attentive   Thought Content: no hallucinations, no delusions   Suicidal Thoughts:  No denies any suicidal or self injurious ideations  Homicidal Thoughts:  No  Memory:   Recent and remote grossly intact   Judgement:  Other:  improving  Insight:  improving   Psychomotor Activity:  Normal  Concentration:  Concentration: Good and Attention Span: Good  Recall:  Good  Fund of Knowledge:  Good  Language:  Good  Akathisia:  Negative  Handed:    AIMS (if indicated):     Assets:  Communication Skills Desire for Improvement Resilience  ADL's:  Intact  Cognition:  WNL  Sleep:  Number  of Hours: 6.25   Assessment - patient presents with improved mood, fuller range of affect, and at this time denies symptoms of alcohol withdrawal and vitals are stable. He is future oriented, and looking forward to going to [facility])  Treatment Plan Summary:   Reviewed as below today 6/4   Medications:  - Continue Zoloft 50 mg daily for depression and anxiety -Continue  Remeron 15 mg po qhs for MDD, insomnia -Continue Seroquel 25 mgrs bid  and 50 mgrs qhs for mood disorder  -Continue Nicotine patch for smoking cessation. - Treatment team working on disposition planning - patient accepted to [facility] rehab.   Myer Peer Cobos,  02/21/2017 5:50 PM   Patient ID: Daniel Arroyo, male   DOB: [birth year], 46 y.o.   MRN: [id number]

## 2017-02-21 NOTE — Progress Notes (Addendum)
Patient ID: Daniel Arroyo, male   DOB: Dec 27, 1970, 46 y.o.   MRN: [id number]  Pt currently presents with an appropriate affect and anxious behavior. Pt reports to Clinical research associatewriter that their goal is to "go home tomorrow." Pt states "I want to have my friend come to get my car so I can go to [facility]." Pt reports good sleep with current medication regimen.   Pt provided with medications per providers orders. Pt's labs and vitals were monitored throughout the night. Pt given a 1:1 about emotional and mental status. Pt supported and encouraged to express concerns and questions. Pt educated on medications and rules of regulations of unit.   Pt's safety ensured with 15 minute and environmental checks. Pt currently denies SI/HI and A/V hallucinations. Pt verbally agrees to seek staff if SI/HI or A/VH occurs and to consult with staff before acting on any harmful thoughts. Will pass on pt request to oncoming RN. Will continue POC.

## 2017-02-21 NOTE — B[facility] Group Notes (Signed)
B[facility] LCSW Group Therapy  02/21/2017 1:15pm  Type of Therapy:  Group Therapy vercoming Obstacles  Participation Level:  Active  Participation Quality:  Appropriate   Affect:  Appropriate  Cognitive:  Appropriate and Oriented  Insight:  Developing/Improving and Improving  Engagement in Therapy:  Improving  Modes of Intervention:  Discussion, Exploration, Problem-solving and Support  Description of Group:   In this group patients will be encouraged to explore what they see as obstacles to their own wellness and recovery. They will be guided to discuss their thoughts, feelings, and behaviors related to these obstacles. The group will process together ways to cope with barriers, with attention given to specific choices patients can make. Each patient will be challenged to identify changes they are motivated to make in order to overcome their obstacles. This group will be process-oriented, with patients participating in exploration of their own experiences as well as giving and receiving support and challenge from other group members.  Summary of Patient Progress: Pt participated appropriately in group and identified difficulty making decisions with long term benefits as an obstacle in his recovery.   Therapeutic Modalities:   Cognitive Behavioral Therapy Solution Focused Therapy Motivational Interviewing Relapse Prevention Therapy   Vernie ShanksLauren Merlie Noga, LCSW 02/21/2017 4:11 PM

## 2017-02-21 NOTE — Progress Notes (Signed)
Adult Psychoeducational Group Note  Date:  02/21/2017 Time:  9:07 PM  Group Topic/Focus:  Wrap-Up Group:   The focus of this group is to help patients review their daily goal of treatment and discuss progress on daily workbooks.  Participation Level:  Active  Participation Quality:  Appropriate  Affect:  Appropriate  Cognitive:  Alert  Insight: Appropriate  Engagement in Group:  Engaged  Modes of Intervention:  Discussion  Additional Comments:  Patient's goal for today was to make progress on his discharge plan. Patient met goal and stated he will be going to [facility] in the morning.  Naomi Kordell Jafri 02/21/2017, 9:07 PM

## 2017-02-21 NOTE — Progress Notes (Signed)
D: Patient reports decreased withdrawal symptoms.  He is sleeping well and his appetite is fair.  His energy level is low and his concentration is poor.  He rates his depression, hopelessness and anxiety as a 6.  His goal is to "work toward continued recovery."  [facility]  He was able to get his court date moved so that he is able to continue long term treatment.  He reports withdrawal symptoms as agitation and irritability.  Patient has brighter affect and his mood has improved since admission.  He rates his depression, hopelessness and anxiety as a 6. A: Continue to monitor medication management and MD orders.  Safety checks completed every 15 minutes per protocol.  Offer support and encouragement as needed. R: Patient is receptive to staff; his behavior is appropriate.

## 2017-02-21 NOTE — Progress Notes (Signed)
[facility] has been [facility].  He will be discharged tomorrow.  Patient has his own transportation here and has been advised that he will need to go straight to [facility].  21 day supply of medication has been ordered.

## 2017-02-22 NOTE — BHH Group Notes (Signed)

## 2017-02-22 NOTE — Progress Notes (Signed)
  [facility] Discharge [facility] :  Will you be returning to the same living situation after discharge:  No. Pt discharging to [facility] for [facility]. At discharge, do you have transportation home?: Yes,  pt will transport himself. Do you have the ability to pay for your medications: Yes,  prescriptions and samples provided.  Release of information consent forms completed and in the chart;  Patient's signature needed at discharge.  Patient to Follow up at: Follow-up Information    [facility] Follow up.   Why:  You have been accepted for treatment. Please arrive no later than 11:30am. Please be sure to have a 2 week supply of medications and 2 weeks of prescriptions with you. Thank you. Contact information: [address] [place], [place] [address] P: [phone] F: [phone]       Services, Alcohol And Drug Follow up.   Specialty:  Behavioral Health Why:  Please go for a walk-in appointment within 1-3 days of discharge to be assessed for IOP services. Walk-in hours are Mon, Wed, Fri 12pm-3pm. Thank you. Contact information: [address] [place] [address] [phone]           Next level of care provider has access [facility] Link:no  Safety Planning and Suicide Prevention discussed: Yes,  with pt.  Have you used any form of tobacco in the last 30 days? (Cigarettes, Smokeless Tobacco, Cigars, and/or Pipes): Yes  Has patient been referred to the Quitline?: Patient refused referral  Patient has been referred for addiction treatment: Yes  Daniel JordanLynn B Daniel Arroyo, MSW, LCSWA 02/22/2017, 9:36 AM

## 2017-02-22 NOTE — Progress Notes (Signed)
Patient ID: Daniel Arroyo, male   DOB: 1971/04/07, 46 y.o.   MRN: [id number] Patient discharged with the plan to follow up at [facility]. Patient states that he is ready to regain his sobriety and stop using excuses for himself.  Patient acknowledged understanding of all discharge instructions. Patient denies any SI, HI and AVH upon discharge.

## 2017-02-22 NOTE — Progress Notes (Signed)
[name] had been up and visible in milieu this evening, did attend and participate in evening group activity and seen interacting appropriately with peers in milieu. He spoke about discharge in the morning and going to a treatment facility. He spoke about how he is ready to leave and was able to receive all bedtime medications without incident. A. Support and encouragement provided. R. Safety maintained, will continue to monitor.

## 2017-02-22 NOTE — BHH Suicide Risk Assessment (Addendum)
[facility] Discharge Suicide Risk Assessment   Principal Problem: Alcohol dependence with alcohol-induced mood disorder [facility]) Discharge Diagnoses:  Patient Active Problem List   Diagnosis Date Noted  . Alcohol dependence with alcohol-induced mood disorder (HCC) [F10.24]   . Major depressive disorder, recurrent (HCC) [F33.9] 07/17/2016  . Alcohol-induced mood disorder (HCC) [F10.94] 06/10/2016  . Cocaine dependence (HCC) [F14.20] 04/23/2014  . Substance induced mood disorder (HCC) [F19.94] 04/23/2014  . MDD (major depressive disorder) (HCC) [F32.9] 04/22/2014  . Alcohol use disorder, severe, dependence (HCC) [F10.20] 12/14/2013  . Major depressive disorder, recurrent severe without psychotic features (HCC) [F33.2] 12/14/2013  . Adult ADHD [F90.9] 12/14/2013  . Polysubstance abuse [F19.10] 12/13/2013  . Alcohol withdrawal (HCC) [F10.239] 10/28/2013    Total Time spent with patient: 30 minutes  Musculoskeletal: Strength & Muscle Tone: within normal limits Gait & Station: normal Patient leans: N/A  Psychiatric Specialty Exam: ROS denies chest pain, denies shortness of breath, denies vomiting   Blood pressure (!) 141/93, pulse 71, temperature 97.7 F (36.5 C), temperature source Oral, resp. rate 16, height 6' (1.829 m), weight 79.4 kg (175 lb), SpO2 100 %.Body mass index is 23.73 kg/m.  General Appearance: Well Groomed  Eye Contact::  Good  Speech:  Normal Rate409  Volume:  Normal  Mood:  improved , feels better   Affect:  Appropriate and Full Range  Thought Process:  Linear and Descriptions of Associations: Intact  Orientation:  Full (Time, Place, and Person)  Thought Content:  denies hallucinations, no delusions   Suicidal Thoughts:  No denies any suicidal or self injurious ideations, denies any homicidal ideations   Homicidal Thoughts:  No  Memory:  recent and remote grossly intact   Judgement:  Other:  improved   Insight:  improved   Psychomotor Activity:  Normal   Concentration:  Good  Recall:  Good  Fund of Knowledge:Good  Language: Good  Akathisia:  Negative  Handed:  Right  AIMS (if indicated):     Assets:  Communication Skills Desire for Improvement Resilience  Sleep:  Number of Hours: 6  Cognition: WNL  ADL's:  Intact   Mental Status Per Nursing Assessment::   On Admission:  Suicidal ideation indicated by patient, Suicide plan, Self-harm thoughts, Self-harm behaviors  Demographic Factors:  46 year old male, lives alone, unemployed  Loss Factors: Recent job loss   Historical Factors: Alcohol and Cocaine Use Disorder , no history of suicide attempts   Risk Reduction Factors:   Sense of responsibility to family and Positive coping skills or problem solving skills  Continued Clinical Symptoms:  At this time patient is alert, attentive, well related, pleasant, mood is improved, affect is appropriate, no thought disorder, no suicidal ideations, no homicidal ideations, no psychotic symptoms Behavior on unit calm, in good control Denies medication side effects No residual withdrawal symptoms  Cognitive Features That Contribute To Risk:  No gross cognitive deficits noted upon discharge. Is alert , attentive, and oriented x 3   Suicide Risk:  Mild:  Suicidal ideation of limited frequency, intensity, duration, and specificity.  There are no identifiable plans, no associated intent, mild dysphoria and related symptoms, good self-control (both objective and subjective assessment), few other risk factors, and identifiable protective factors, including available and accessible social support.  Follow-up Information    [facility] Follow up.   Why:  Social worker has made a referral on your behalf. Please continue to call to check for bed availablity. Thank you. Contact information: [address] [place], [place]  [address] P: [phone] F: [phone]       Services, Alcohol And Drug Follow up.   Specialty:  Behavioral Health Why:   Please go for a walk-in appointment within 1-3 days of discharge to be assessed for IOP services. Walk-in hours are Mon, Wed, Fri 12pm-3pm. Thank you. Contact information: [address] [place] [address] [phone]           Plan Of Care/Follow-up recommendations:  Activity:  as tolerated  Diet:  Regular Tests:  NA  Other:  See below  Patient is leaving unit in good spirits  Plans to follow up as above  Medication side effects, to include risk of metabolic and movement disorders related to Seroquel, sedation risks of Seroquel and Remeron discussed with patient .  Craige CottaFernando A Cobos, MD 02/22/2017, 9:31 AM

## 2017-02-22 NOTE — Tx Team (Signed)
Interdisciplinary Treatment and Diagnostic Plan Update 02/22/2017 Time of Session: 9:30am  Daniel Arroyo  MRN: [id number]  Principal Diagnosis: Alcohol dependence with alcohol-induced mood disorder (HCC)  Secondary Diagnoses: Principal Problem:   Alcohol dependence with alcohol-induced mood disorder (HCC) Active Problems:   Major depressive disorder, recurrent severe without psychotic features (HCC)   Current Medications:  Current Facility-Administered Medications  Medication Dose Route Frequency Provider Last Rate Last Dose  . acetaminophen (TYLENOL) tablet 650 mg  650 mg Oral Q6H PRN Charm Rings, NP   650 mg at 02/21/17 2017  . alum & mag hydroxide-simeth (MAALOX/MYLANTA) 200-200-20 MG/5ML suspension 30 mL  30 mL Oral Q4H PRN Charm Rings, NP      . folic acid (FOLVITE) tablet 1 mg  1 mg Oral Daily Charm Rings, NP   1 mg at 02/22/17 0800  . magnesium hydroxide (MILK OF MAGNESIA) suspension 30 mL  30 mL Oral Daily PRN Charm Rings, NP      . mirtazapine (REMERON) tablet 15 mg  15 mg Oral QHS Beau Fanny, FNP   15 mg at 02/21/17 2248  . multivitamin with minerals tablet 1 tablet  1 tablet Oral Daily Armandina Stammer I, NP   1 tablet at 02/22/17 0800  . nicotine (NICODERM CQ - dosed in mg/24 hours) patch 21 mg  21 mg Transdermal Daily Nira Conn A, NP   21 mg at 02/22/17 0800  . QUEtiapine (SEROQUEL) tablet 25 mg  25 mg Oral BID Adonis Brook, NP   25 mg at 02/22/17 0800  . QUEtiapine (SEROQUEL) tablet 50 mg  50 mg Oral QHS Adonis Brook, NP   50 mg at 02/21/17 2248  . sertraline (ZOLOFT) tablet 50 mg  50 mg Oral Daily Burnard Leigh, MD   50 mg at 02/22/17 0800  . thiamine (VITAMIN B-1) tablet 100 mg  100 mg Oral Daily Charm Rings, NP   100 mg at 02/22/17 0800   Or  . thiamine (B-1) injection 100 mg  100 mg Intravenous Daily Charm Rings, NP        PTA Medications: Prescriptions Prior to Admission  Medication Sig Dispense Refill Last Dose  .  cetirizine (ZYRTEC) 10 MG tablet Take 1 tablet (10 mg total) by mouth daily. (Patient not taking: Reported on 02/10/2017) 30 tablet 0 Not Taking at Unknown time  . fluticasone (FLONASE) 50 MCG/ACT nasal spray Place 2 sprays into both nostrils daily. (Patient not taking: Reported on 02/10/2017) 16 g 0 Not Taking at Unknown time  . gabapentin (NEURONTIN) 300 MG capsule Take 1 capsule (300 mg total) by mouth 3 (three) times daily. (Patient not taking: Reported on 02/10/2017) 90 capsule 0 Not Taking at Unknown time  . hydrOXYzine (ATARAX/VISTARIL) 25 MG tablet Take 1 tablet (25 mg total) by mouth every 6 (six) hours as needed for anxiety. (Patient not taking: Reported on 02/10/2017) 30 tablet 0 Not Taking at Unknown time  . mirtazapine (REMERON) 15 MG tablet Take 1 tablet (15 mg total) by mouth at bedtime. (Patient not taking: Reported on 02/10/2017) 30 tablet 0 Not Taking at Unknown time  . naltrexone (DEPADE) 50 MG tablet Take 1 tablet (50 mg total) by mouth daily. (Patient not taking: Reported on 02/10/2017) 30 tablet 0 Not Taking at Unknown time  . nicotine (NICODERM CQ - DOSED IN MG/24 HOURS) 21 mg/24hr patch Place 1 patch (21 mg total) onto the skin daily at 6 (six) AM. (Patient not taking: Reported on 02/10/2017)  28 patch 0 Not Taking at Unknown time  . pantoprazole (PROTONIX) 20 MG tablet Take 1 tablet (20 mg total) by mouth daily. (Patient not taking: Reported on 02/10/2017) 30 tablet 0 Not Taking at Unknown time  . thiamine 100 MG tablet Take 1 tablet (100 mg total) by mouth daily. (Patient not taking: Reported on 02/10/2017) 30 tablet 0 Not Taking at Unknown time    Treatment Modalities: Medication Management, Group therapy, Case management,  1 to 1 session with clinician, Psychoeducation, Recreational therapy.  Patient Stressors: Financial difficulties Occupational concerns Substance abuse Patient Strengths: Ability for insight Average or above average intelligence Capable of independent  living Barrister's clerk for treatment/growth Supportive family/friends  Physician Treatment Plan for Primary Diagnosis: Alcohol dependence with alcohol-induced mood disorder (HCC) Long Term Goal(s): Improvement in symptoms so as ready for discharge Short Term Goals: Ability to verbalize feelings will improve Ability to disclose and discuss suicidal ideas Ability to demonstrate self-control will improve Ability to identify changes in lifestyle to reduce recurrence of condition will improve Ability to demonstrate self-control will improve Ability to identify and develop effective coping behaviors will improve Ability to identify triggers associated with substance abuse/mental health issues will improve  Medication Management: Evaluate patient's response, side effects, and tolerance of medication regimen.  Therapeutic Interventions: 1 to 1 sessions, Unit Group sessions and Medication administration.  Evaluation of Outcomes: Adequate for Discharge  Physician Treatment Plan for Secondary Diagnosis: Principal Problem:   Alcohol dependence with alcohol-induced mood disorder (HCC) Active Problems:   Major depressive disorder, recurrent severe without psychotic features (HCC)  Long Term Goal(s): Improvement in symptoms so as ready for discharge  Short Term Goals: Ability to verbalize feelings will improve Ability to disclose and discuss suicidal ideas Ability to demonstrate self-control will improve Ability to identify changes in lifestyle to reduce recurrence of condition will improve Ability to demonstrate self-control will improve Ability to identify and develop effective coping behaviors will improve Ability to identify triggers associated with substance abuse/mental health issues will improve  Medication Management: Evaluate patient's response, side effects, and tolerance of medication regimen.  Therapeutic Interventions: 1 to 1 sessions, Unit Group sessions and  Medication administration.  Evaluation of Outcomes: Adequate for Discharge  RN Treatment Plan for Primary Diagnosis: Alcohol dependence with alcohol-induced mood disorder (HCC) Long Term Goal(s): Knowledge of disease and therapeutic regimen to maintain health will improve  Short Term Goals: Ability to remain free from injury will improve and Compliance with prescribed medications will improve  Medication Management: RN will administer medications as ordered by provider, will assess and evaluate patient's response and provide education to patient for prescribed medication. RN will report any adverse and/or side effects to prescribing provider.  Therapeutic Interventions: 1 on 1 counseling sessions, Psychoeducation, Medication administration, Evaluate responses to treatment, Monitor vital signs and CBGs as ordered, Perform/monitor CIWA, COWS, AIMS and Fall Risk screenings as ordered, Perform wound care treatments as ordered.  Evaluation of Outcomes: Adequate for Discharge  LCSW Treatment Plan for Primary Diagnosis: Alcohol dependence with alcohol-induced mood disorder (HCC) Long Term Goal(s): Safe transition to appropriate next level of care at discharge, Engage patient in therapeutic group addressing interpersonal concerns. Short Term Goals: Engage patient in aftercare planning with referrals and resources, Facilitate patient progression through stages of change regarding substance use diagnoses and concerns, Identify triggers associated with mental health/substance abuse issues and Increase skills for wellness and recovery  Therapeutic Interventions: Assess for all discharge needs, 1 to 1 time with Child psychotherapist, Explore  available resources and support systems, Assess for adequacy in community support network, Educate family and significant other(s) on suicide prevention, Complete Psychosocial Assessment, Interpersonal group therapy.  Evaluation of Outcomes: Adequate for Discharge  Progress in  Treatment: Attending groups: Yes Participating in groups: Yes Taking medication as prescribed: Yes, MD continues to assess for medication changes as needed Toleration medication: Yes, no side effects reported at this time Family/Significant other contact made: No, pt declined consent. Patient understands diagnosis: Yes, AEB pt's willingness to participate in treatment. Discussing patient identified problems/goals with staff: Yes Medical problems stabilized or resolved: Yes Denies suicidal/homicidal ideation: Yes Issues/concerns per patient self-inventory: None Other: N/A  New problem(s) identified: None identified at this time.   New Short Term/Long Term Goal(s): None identified at this time.   Discharge Plan or Barriers: [facility]  Reason for Continuation of Hospitalization:  None identified.  Estimated Length of Stay: 0 days  Attendees: Patient: 02/22/2017 9:34 AM  Physician: Dr. Jama Flavorsobos 02/22/2017 9:34 AM  Nursing: Vanessa KickJan, RN 02/22/2017 9:34 AM  RN Care Manager: 02/22/2017 9:34 AM  Social Worker:Ariq Khamis Beverely PaceBryant, LCSWA 02/22/2017 9:34 AM  Recreational Therapist:  02/22/2017 9:34 AM  Other: Armandina StammerAgnes Nwoko, NP 02/22/2017 9:34 AM  Other:  02/22/2017 9:34 AM  Other: 02/22/2017 9:34 AM   Scribe for Treatment Team: Jonathon JordanLynn B Cason Luffman, MSW,LCSWA 02/22/2017 9:34 AM

## 2017-05-05 ENCOUNTER — Emergency Department (HOSPITAL_COMMUNITY)
Admission: EM | Admit: 2017-05-05 | Discharge: 2017-05-05 | Disposition: A | Discharge status: Home / Self Care | Payer: Medicaid Other | Attending: Emergency Medicine | Admitting: Emergency Medicine

## 2017-05-05 DIAGNOSIS — Z87891 Personal history of nicotine dependence: Secondary | ICD-10-CM | POA: Insufficient documentation

## 2017-05-05 DIAGNOSIS — Z79899 Other long term (current) drug therapy: Secondary | ICD-10-CM | POA: Diagnosis not present

## 2017-05-05 DIAGNOSIS — F1010 Alcohol abuse, uncomplicated: Secondary | ICD-10-CM | POA: Diagnosis present

## 2017-05-05 DIAGNOSIS — F909 Attention-deficit hyperactivity disorder, unspecified type: Secondary | ICD-10-CM | POA: Diagnosis not present

## 2017-05-05 MED ORDER — CHLORDIAZEPOXIDE HCL 25 MG PO CAPS: ORAL_CAPSULE | ORAL | 0 refills | Status: DC

## 2017-05-05 NOTE — ED Provider Notes (Signed)
[facility]-EMERGENCY DEPT Provider Note   CSN: [id number] Arrival date & time: 05/05/17  1123     History   Chief Complaint Chief Complaint  Patient presents with  . Psychiatric Evaluation    HPI Daniel Arroyo is a 46 y.o. male.  HPI Patient is a 46 year old male presents the emergency department requesting assistance with alcohol abuse.  He states he was released from his assisted living facility yesterday where he is been rehabilitating for a stroke.  He has nowhere to go and is homeless and is unsure what to do.  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  [place] but he states will not help mild at this time.  He is ambulatory with a cane.  He has no homicidal or suicidal thoughts.   Past Medical History:  Diagnosis Date  . ADHD, adult residual type   . Anxiety   . Anxiety and depression   . Bipolar disorder (HCC)   . Depression   . Gastroesophageal reflux disease   . Knee pain   . Polysubstance abuse     Patient Active Problem List   Diagnosis Date Noted  . Alcohol dependence with alcohol-induced mood disorder (HCC)   . Major depressive disorder, recurrent (HCC) 07/17/2016  . Alcohol-induced mood disorder (HCC) 06/10/2016  . Cocaine dependence (HCC) 04/23/2014  . Substance induced mood disorder (HCC) 04/23/2014  . MDD (major depressive disorder) (HCC) 04/22/2014  . Alcohol use disorder, severe, dependence (HCC) 12/14/2013  . Major depressive disorder, recurrent severe without psychotic features (HCC) 12/14/2013  . Adult ADHD 12/14/2013  . Polysubstance abuse 12/13/2013  . Alcohol withdrawal (HCC) 10/28/2013    Past Surgical History:  Procedure Laterality Date  . TONSILLECTOMY         Home Medications    Prior to Admission medications   Medication Sig Start Date End Date Taking? Authorizing Provider  chlordiazePOXIDE (LIBRIUM) 25 MG  capsule 50mg  PO TID x 1D, then 25-50mg  PO BID X 1D, then 25-50mg  PO QD X 1D 05/05/17   Azalia Bilisampos, Tianne Plott, MD  fluticasone [facility](FLONASE) 50 MCG/ACT nasal spray Place 2 sprays into both nostrils daily. Patient not taking: Reported on 02/10/2017 09/08/16   Cheri Fowlerose, Kayla, PA-C  folic acid (FOLVITE) 1 MG tablet Take 1 tablet (1 mg total) by mouth daily. 02/22/17   Oneta RackLewis, Tanika N, NP  mirtazapine (REMERON) 15 MG tablet Take 1 tablet (15 mg total) by mouth at bedtime. 02/21/17   Oneta RackLewis, Tanika N, NP  QUEtiapine (SEROQUEL) 25 MG tablet Take 1 tablet (25 mg total) by mouth 2 (two) times daily. Take 2 tables (50mg ) by mouth at bedtime 02/21/17   Oneta RackLewis, Tanika N, NP  sertraline (ZOLOFT) 50 MG tablet Take 1 tablet (50 mg total) by mouth daily. 02/22/17   Oneta RackLewis, Tanika N, NP    Family History No family history on file.  Social History Social History  Substance Use Topics  . Smoking status: Former Smoker    Packs/day: 1.00  . Smokeless tobacco: Never Used  . Alcohol use Yes     Comment: in rehab      Allergies   Patient has no known allergies.   Review of Systems Review of Systems  All other systems reviewed and are negative.    Physical Exam Updated Vital Signs BP 134/88 (BP Location: Right Arm)   Pulse 86   Temp 98.6 F (37  C) (Oral)   Resp 16   Ht 6' (1.829 m)   Wt 77.1 kg (170 lb)   SpO2 98%   BMI 23.06 kg/m   Physical Exam  Constitutional: He is oriented to person, place, and time. He appears well-developed and well-nourished.  HENT:  Head: Normocephalic and atraumatic.  Eyes: EOM are normal.  Neck: Normal range of motion.  Cardiovascular: Normal rate, regular rhythm, normal heart sounds and intact distal pulses.   Pulmonary/Chest: Effort normal and breath sounds normal. No respiratory distress.  Abdominal: Soft. He exhibits no distension. There is no tenderness.  Musculoskeletal: Normal range of motion.  Neurological: He is alert and oriented to person, place, and time.  Skin: Skin is  warm and dry.  Psychiatric: He has a normal mood and affect. Judgment normal.  Nursing note and vitals reviewed.    ED Treatments / Results  Labs (all labs ordered are listed, but only abnormal results are displayed) Labs Reviewed - No data to display  EKG  EKG Interpretation None       Radiology No results found.  Procedures Procedures (including critical care time)  Medications Ordered in ED Medications - No data to display   Initial Impression / Assessment and Plan / ED Course  I have reviewed the triage vital signs and the nursing notes.  Pertinent labs & imaging results that were available during my care of the patient were reviewed by me and considered in my medical decision making (see chart for details).     Social worker involved to provide a homeless shelter information as well as financial assistance.  I will prescribe him a Librium taper forongoing alcohol abuse.  He is focused on quitting alcohol.  I've given outpatient resources for substance abuse as well.  He can safely be discharged from the emergency department at this time.  I do not believe the patient is a threat to himself or others at this time.  Final Clinical Impressions(s) / ED Diagnoses   Final diagnoses:  Alcohol abuse    New Prescriptions New Prescriptions   CHLORDIAZEPOXIDE (LIBRIUM) 25 MG CAPSULE    50mg  PO TID x 1D, then 25-50mg  PO BID X 1D, then 25-50mg  PO QD X 1D     Azalia Bilis, MD 05/05/17 1239

## 2017-05-05 NOTE — ED Triage Notes (Signed)
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

## 2017-05-05 NOTE — Progress Notes (Signed)
CSW spoke with patient via bedside regarding discharge plans. [place] and [place]. Patient requested shelter resources and behavioral health resources. CSW provided requested information to patient and added information to [facility] on [facility].   Stacy GardnerErin Wannetta Langland, [facility] Emergency Room Clinical Social Worker [phone]

## 2019-03-06 ENCOUNTER — Other Ambulatory Visit: Payer: Self-pay

## 2019-03-06 DIAGNOSIS — R200 Anesthesia of skin: Secondary | ICD-10-CM

## 2019-03-12 ENCOUNTER — Ambulatory Visit (HOSPITAL_COMMUNITY)
Admission: RE | Admit: 2019-03-12 | Discharge: 2019-03-12 | Disposition: A | Discharge status: Home / Self Care | Payer: BC Managed Care – PPO | Source: Ambulatory Visit | Attending: Surgery | Admitting: Surgery

## 2019-03-12 ENCOUNTER — Other Ambulatory Visit: Payer: Self-pay

## 2019-03-12 ENCOUNTER — Ambulatory Visit (INDEPENDENT_AMBULATORY_CARE_PROVIDER_SITE_OTHER)
Admission: RE | Admit: 2019-03-12 | Discharge: 2019-03-12 | Disposition: A | Discharge status: Home / Self Care | Payer: BC Managed Care – PPO | Source: Ambulatory Visit | Attending: Surgery | Admitting: Surgery

## 2019-03-12 ENCOUNTER — Ambulatory Visit (INDEPENDENT_AMBULATORY_CARE_PROVIDER_SITE_OTHER): Payer: BC Managed Care – PPO | Admitting: Surgery

## 2019-03-12 ENCOUNTER — Encounter: Payer: Self-pay | Admitting: Surgery

## 2019-03-12 ENCOUNTER — Other Ambulatory Visit (HOSPITAL_COMMUNITY): Payer: Self-pay | Admitting: Surgery

## 2019-03-12 DIAGNOSIS — R200 Anesthesia of skin: Secondary | ICD-10-CM

## 2019-03-12 DIAGNOSIS — L539 Erythematous condition, unspecified: Secondary | ICD-10-CM

## 2019-03-12 DIAGNOSIS — R600 Localized edema: Secondary | ICD-10-CM

## 2019-03-12 DIAGNOSIS — M7989 Other specified soft tissue disorders: Secondary | ICD-10-CM | POA: Insufficient documentation

## 2019-03-12 NOTE — Progress Notes (Signed)
[facility]  Patient name: Daniel Arroyo MRN: [id number] DOB: August 08, 1971 Sex: male   REQUESTING PROVIDER:    Dr. Sherryll BurgerShah   REASON FOR CONSULT:    Numbness io foot  HISTORY OF PRESENT ILLNESS:   Daniel Arroyo is a 48 y.o. male, who is referred for evaluation of numbness in his feet.  The patient states that his feet began like they were numb about 3 months ago.  He does not recall any inciting event.  His feet are numb 70-80% of the day today hurt to keep him from sleeping.  He does get cramping with walking.  He is also noticed swelling in his legs which he feels began about 3 months ago.  Patient does have a history of stroke in 2018 this was secondary to substance abuse.  He has been clean for 2 years.  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  He does walk with a cane.  Patient has a history of bipolar disorder anxiety and depression.  PAST MEDICAL HISTORY    Past Medical History:  Diagnosis Date  . ADHD, adult residual type   . Anxiety   . Anxiety and depression   . Bipolar disorder (HCC)   . Depression   . Gastroesophageal reflux disease   . Knee pain   . Polysubstance abuse      FAMILY HISTORY   No family history on file.  SOCIAL HISTORY:   Social History   Socioeconomic History  . Marital status: Divorced    Spouse name: Not on file  . Number of children: Not on file  . Years of education: Not on file  . Highest education level: Not on file  Occupational History  . Not on file  Social Needs  . Financial resource strain: Not on file  . Food insecurity    Worry: Not on file    Inability: Not on file  . Transportation needs    Medical: Not on file    Non-medical: Not on file  Tobacco Use  . Smoking status: Former Smoker    Packs/day: 1.00  . Smokeless tobacco: Never Used  Substance and Sexual Activity  . Alcohol use: Yes    Comment: in rehab   . Drug use: Yes    Types: Heroin, Marijuana    Comment: in rehab  . Sexual activity: Not on file  Lifestyle  . Physical activity    Days per week: Not on file    Minutes per session: Not on file  . Stress: Not on file  Relationships  . Social Musicianconnections    Talks on phone: Not on file    Gets together: Not on file    Attends religious service: Not on file    Active member of club or organization: Not on file    Attends meetings of clubs or organizations: Not on file    Relationship status: Not on file  . Intimate partner violence    Fear of current or ex partner: Not on file    Emotionally abused: Not on file    Physically abused: Not on file    Forced sexual activity: Not on file  Other Topics Concern  . Not on file  Social History Narrative  . Not on file    ALLERGIES:    No Known Allergies  CURRENT MEDICATIONS:    Current Outpatient Medications  Medication Sig Dispense Refill  . chlordiazePOXIDE (LIBRIUM) 25  MG capsule 50mg  PO TID x 1D, then 25-50mg  PO BID X 1D, then 25-50mg  PO QD X 1D 10 capsule 0  . fluticasone (FLONASE) 50 MCG/ACT nasal spray Place 2 sprays into both nostrils daily. (Patient not taking: Reported on 02/10/2017) 16 g 0  . folic acid (FOLVITE) 1 MG tablet Take 1 tablet (1 mg total) by mouth daily. 30 tablet 0  . mirtazapine (REMERON) 15 MG tablet Take 1 tablet (15 mg total) by mouth at bedtime. 30 tablet 0  . QUEtiapine (SEROQUEL) 25 MG tablet Take 1 tablet (25 mg total) by mouth 2 (two) times daily. Take 2 tables (50mg ) by mouth at bedtime 90 tablet 0  . sertraline (ZOLOFT) 50 MG tablet Take 1 tablet (50 mg total) by mouth daily. 30 tablet 0   No current facility-administered medications for this visit.     REVIEW OF SYSTEMS:   [X]  denotes positive finding, [ ]  denotes negative finding Cardiac  Comments:  Chest pain or chest pressure:    Shortness of breath upon exertion:    Short of breath when lying flat:    Irregular  heart rhythm:        Vascular    Pain in calf, thigh, or hip brought on by ambulation:    Pain in feet at night that wakes you up from your sleep:     Blood clot in your veins:    Leg swelling:  x       Pulmonary    Oxygen at home:    Productive cough:     Wheezing:         Neurologic    Sudden weakness in arms or legs:  xx   Sudden numbness in arms or legs:  xx   Sudden onset of difficulty speaking or slurred speech:    Temporary loss of vision in one eye:     Problems with dizziness:         Gastrointestinal    Blood in stool:      Vomited blood:         Genitourinary    Burning when urinating:     Blood in urine:        Psychiatric    Major depression:         Hematologic    Bleeding problems:    Problems with blood clotting too easily:        Skin    Rashes or ulcers: x       Constitutional    Fever or chills:     PHYSICAL EXAM:   There were no vitals filed for this visit.  GENERAL: The patient is a well-nourished male, in no acute distress. The vital signs are documented above. CARDIAC: There is a regular rate and rhythm.  VASCULAR: Significant bilateral lower extremity edema, right greater than left with hyperpigmentation in the right ankle region and early hyperpigmentation in the left. PULMONARY: Nonlabored respirations ABDOMEN: Soft and non-tender with normal pitched bowel sounds.  MUSCULOSKELETAL: There are no major deformities or cyanosis. NEUROLOGIC: Left-sided weakness SKIN: There are no ulcers or rashes noted. PSYCHIATRIC: The patient has a normal affect.  STUDIES:   I have reviewed the following studies:  +-------+-----------+-----------+------------+------------+ ABI/TBIToday's ABIToday's TBIPrevious ABIPrevious TBI +-------+-----------+-----------+------------+------------+ Right  [place]         0.84                                +-------+-----------+-----------+------------+------------+  Left   [place]         0.85                                 +-------+-----------+-----------+------------+------------+  Left toe 100 Right toe 99  Venous duplex:   Right: There is no evidence of deep vein thrombosis in the lower extremity.There is no evidence of superficial venous thrombosis. Unable to clearly visualize the peroneal veins due to edema; however, the tibioperoneal trunk is compressible. ASSESSMENT and PLAN   I discussed with the patient that his ABIs are not normal however I do not think that his symptoms are consistent with an arterial pathology.  Given that he has numbness at rest as well as significant bilateral leg swelling and hyperpigmentation on the medial ankles bilaterally, I suspect his problems are mainly related to fluid overload.  I ordered a venous ultrasound to rule out DVT which was negative.  I suspect that if we can control the patient's leg swelling, his symptoms will get better.  I have placed him in 20-30 compression stockings today.  He states that home health comes out to his house so hopefully they can help get these changed as the patient will likely not be able to put these on given his neurologic deficits.  If he continues to have issues, I would a trial of Lasix to see if this helps angiography however I am not sure this will be very productive.  Other ancillary measures that can be performed would be to keep his legs elevated when possible.  Dr. Manuella Ghazi could consider a trial of Lasix to see if this helps with his edema.  Also, he would need to be ruled out for cardiac and renal etiologies for his leg swelling.  I have not scheduled the patient for follow-up however if he continues to have issues he will reach out.   Leia Alf, MD, FACS [facility] [phone] Pager [phone]

## 2019-05-23 ENCOUNTER — Encounter: Payer: Self-pay | Admitting: Neurology

## 2019-06-05 ENCOUNTER — Encounter: Payer: Self-pay | Admitting: *Deleted

## 2019-06-06 ENCOUNTER — Other Ambulatory Visit: Payer: Self-pay

## 2019-06-06 ENCOUNTER — Encounter: Payer: Self-pay | Admitting: Cardiology

## 2019-06-06 ENCOUNTER — Ambulatory Visit (INDEPENDENT_AMBULATORY_CARE_PROVIDER_SITE_OTHER): Payer: BC Managed Care – PPO | Admitting: Cardiology

## 2019-06-06 VITALS — BP 122/70 | HR 100 | Temp 98.9°F | Ht 72.0 in | Wt 147.0 lb

## 2019-06-06 DIAGNOSIS — R600 Localized edema: Secondary | ICD-10-CM | POA: Diagnosis not present

## 2019-06-06 DIAGNOSIS — I351 Nonrheumatic aortic (valve) insufficiency: Secondary | ICD-10-CM

## 2019-06-06 DIAGNOSIS — I77810 Thoracic aortic ectasia: Secondary | ICD-10-CM | POA: Diagnosis not present

## 2019-06-06 DIAGNOSIS — I890 Lymphedema, not elsewhere classified: Secondary | ICD-10-CM

## 2019-06-06 MED ORDER — POTASSIUM CHLORIDE ER 10 MEQ PO TBCR: 10.0000 meq | EXTENDED_RELEASE_TABLET | Freq: Every day | ORAL | 1 refills | Status: DC

## 2019-06-06 MED ORDER — FUROSEMIDE 20 MG PO TABS: 20.0000 mg | ORAL_TABLET | Freq: Every day | ORAL | 1 refills | Status: DC

## 2019-06-06 NOTE — Patient Instructions (Addendum)
Medication Instructions:   Your physician has recommended you make the following change in your medication:   Start furosemide 20 mg by mouth daily  Start potassium chloride 10 meq by mouth daily  Continue all other medications the same  Labwork:  NONE  Testing/Procedures:  NONE  Follow-Up:  Your physician recommends that you schedule a follow-up appointment in: 6-8 weeks  Any Other Special Instructions Will Be Listed Below (If Applicable).  If you need a refill on your cardiac medications before your next appointment, please call your pharmacy.

## 2019-06-06 NOTE — Progress Notes (Signed)
Cardiology Office Note  Date: 06/06/2019   ID: Daniel BiddingRonnie Ewald, DOB July 17, 1971, MRN [id number]  PCP:  Salley SlaughterBoone, Angela, NP  Consulting Cardiologist:  Nona DellSamuel Arabia Nylund, MD Electrophysiologist:  None   Chief Complaint  Patient presents with  . Leg Swelling    History of Present Illness: Daniel Arroyo is a 48 y.o. male referred for cardiology consultation by Ms. Lissa HoardBoone NP with a history of leg swelling.  I reviewed his records.  Patient was already evaluated by Dr. Myra GianottiBrabham with [facility] in June for leg pain and swelling.  ABIs were mildly abnormal at 0.84 on the right and 0.85 on the left, however not likely cause of symptoms.  Venous Dopplers did not show evidence of DVT.  It was recommended that he wear compression hose and consider the possibility of Lasix.  [facility] in mid August reported borderline LVH with LVEF 50 to 55% and normal diastolic function, mildly dilated left atrium, normal right ventricular chamber size, mildly sclerotic aortic valve with moderate aortic regurgitation, mild mitral regurgitation, mild tricuspid regurgitation, no pericardial effusion, mildly dilated aortic root at 4.27 cm.  He is here today with his father who checks on him daily due to functional limitations at baseline.  He has residual from prior stroke, his left arm is in a sling.  He has been using lower extremity compression, but still having trouble with leg swelling.  He was prescribed limited courses of Lasix with potassium supplements by PCP, states that this was effective, but his swelling would return when he stopped it.  Past Medical History:  Diagnosis Date  . ADHD, adult residual type   . Anxiety and depression   . Bipolar disorder (HCC)   . Depression   . Gastroesophageal reflux disease   . History of stroke 2018   Reportedly in the setting of substance abuse  . Knee pain   . Polysubstance abuse [facility](HCC)     Past Surgical History:  Procedure Laterality  Date  . TONSILLECTOMY      Current Outpatient Medications  Medication Sig Dispense Refill  . ADDERALL XR 20 MG 24 hr capsule     . Divalproex Sodium (DEPAKOTE PO) Take by mouth.    . fluticasone (FLONASE) 50 MCG/ACT nasal spray Place 2 sprays into both nostrils daily. 16 g 0  . mirtazapine (REMERON) 15 MG tablet Take 1 tablet (15 mg total) by mouth at bedtime. 30 tablet 0  . naproxen (NAPROSYN) 500 MG tablet     . omeprazole (PRILOSEC) 20 MG capsule Take 20 mg by mouth daily.    . furosemide (LASIX) 20 MG tablet Take 1 tablet (20 mg total) by mouth daily. 90 tablet 1  . potassium chloride (K-DUR) 10 MEQ tablet Take 1 tablet (10 mEq total) by mouth daily. 90 tablet 1   No current facility-administered medications for this visit.    Allergies:  Patient has no known allergies.   Social History: The patient  reports that he has been smoking cigarettes. He has been smoking about 0.50 packs per day. He has never used smokeless tobacco. He reports current alcohol use. He reports current drug use. Drugs: Heroin and Marijuana.   Family History: The patient's family history includes HIV in his brother; Heart attack in his mother.   ROS:  Please see the history of present illness. Otherwise, complete review of systems is positive for fatigue, lack of energy.  All other systems are reviewed and negative.   Physical Exam: VS:  BP 122/70   Pulse 100   Temp 98.9 F (37.2 C)   Ht 6' (1.829 m)   Wt 147 lb (66.7 kg)   SpO2 96%   BMI 19.94 kg/m , BMI Body mass index is 19.94 kg/m.  Wt Readings from Last 3 Encounters:  06/06/19 147 lb (66.7 kg)  03/12/19 146 lb (66.2 kg)  05/05/17 170 lb (77.1 kg)    General: Chronically ill-appearing male. HEENT: Conjunctiva and lids normal, wearing a mask. Neck: Supple, no elevated JVP or carotid bruits, no thyromegaly. Lungs: Clear to auscultation, nonlabored breathing at rest. Cardiac: Regular rate and rhythm, no S3, soft systolic murmur, no obvious  diastolic murmur appreciated, no pericardial rub. Abdomen: Soft, nontender, bowel sounds present. Extremities: 2-3+ bilateral leg edema with some extension into the dorsum of the feet, distal pulses 1-2+. Skin: Warm and dry. Musculoskeletal: Contraction deformities from prior stroke. Neuropsychiatric: Alert and oriented x3, affect grossly appropriate.  ECG:  An ECG dated 02/15/2017 was personally reviewed today and demonstrated:  Normal sinus rhythm.  Recent Labwork:  No recent lab work available for review.  Assessment and Plan:  1.  Bilateral leg edema which looks to be consistent with lymphedema.  Recent echocardiogram reported LVEF 50 to 55% with normal diastolic function.  He does have moderate aortic regurgitation, but would not expect this to be contributing to his swelling.  Vascular studies were undertaken by Dr. Trula Slade as outlined above.  Would recommend continued efforts at mechanical compression, and also initiate Lasix 20 mg daily with KCl 10 mEq daily.  He will get a follow-up BMET in 2 weeks.  Diuretics can be adjusted based on affect.  If this is not effective he could be referred for physical therapy management of lymphedema.  2.  Moderate aortic regurgitation with mildly dilated aortic root.  This can be followed over time.  3.  Previous history of stroke in the setting of substance abuse with residua and functional defects.  Medication Adjustments/Labs and Tests Ordered: Current medicines are reviewed at length with the patient today.  Concerns regarding medicines are outlined above.   Tests Ordered: No orders of the defined types were placed in this encounter.   Medication Changes: Meds ordered this encounter  Medications  . furosemide (LASIX) 20 MG tablet    Sig: Take 1 tablet (20 mg total) by mouth daily.    Dispense:  90 tablet    Refill:  1  . potassium chloride (K-DUR) 10 MEQ tablet    Sig: Take 1 tablet (10 mEq total) by mouth daily.    Dispense:  90  tablet    Refill:  1    Disposition:  Follow up 6 to 8 weeks in the [place] office.  Signed, Satira Sark, MD, [facility] 06/06/2019 1:37 PM    [facility] [place], [place], [place] [address] Phone: [phone]; Fax: [phone]

## 2019-07-13 ENCOUNTER — Ambulatory Visit: Payer: BC Managed Care – PPO | Admitting: Neurology

## 2019-07-19 ENCOUNTER — Encounter: Payer: Self-pay | Admitting: Cardiology

## 2019-07-19 ENCOUNTER — Other Ambulatory Visit: Payer: Self-pay

## 2019-07-19 ENCOUNTER — Ambulatory Visit (INDEPENDENT_AMBULATORY_CARE_PROVIDER_SITE_OTHER): Payer: BC Managed Care – PPO | Admitting: Cardiology

## 2019-07-19 VITALS — BP 159/85 | HR 66 | Ht 72.0 in | Wt 148.6 lb

## 2019-07-19 DIAGNOSIS — I351 Nonrheumatic aortic (valve) insufficiency: Secondary | ICD-10-CM

## 2019-07-19 DIAGNOSIS — I890 Lymphedema, not elsewhere classified: Secondary | ICD-10-CM

## 2019-07-19 NOTE — Progress Notes (Signed)
Cardiology Office Note  Date: 07/19/2019   ID: Daniel Arroyo, DOB 19-Jan-1971, MRN [id number]  PCP:  Arsenio Katz, NP  Cardiologist:  Rozann Lesches, MD Electrophysiologist:  None   Chief Complaint  Patient presents with  . Cardiac follow-up    History of Present Illness: Daniel Arroyo is a 48 y.o. male that I met in consultation in September.  He is here today with his father for a follow-up visit.  He states that he has been released from the wound center due to improvement.  He has leg swelling/lymphedema documented at the last visit and was started on Lasix 20 mg daily with potassium supplements. He is also using leg wraps at home.  He feels like the regular diuretic has been helpful.  I reviewed his remaining medications which are outlined below.  I personally reviewed his ECG today which shows normal sinus rhythm.  Past Medical History:  Diagnosis Date  . ADHD, adult residual type   . Anxiety and depression   . Bipolar disorder ([place])   . Depression   . Gastroesophageal reflux disease   . History of stroke 2018   Reportedly in the setting of substance abuse  . Knee pain   . Polysubstance abuse [facility])     Past Surgical History:  Procedure Laterality Date  . TONSILLECTOMY      Current Outpatient Medications  Medication Sig Dispense Refill  . ADDERALL XR 20 MG 24 hr capsule Take 20 mg by mouth daily.     . divalproex (DEPAKOTE) 250 MG DR tablet Take 250 mg by mouth daily.    . furosemide (LASIX) 20 MG tablet Take 1 tablet (20 mg total) by mouth daily. 90 tablet 1  . mirtazapine (REMERON) 15 MG tablet Take 1 tablet (15 mg total) by mouth at bedtime. 30 tablet 0  . naproxen (NAPROSYN) 500 MG tablet Take 500 mg by mouth as needed.     Marland Kitchen omeprazole (PRILOSEC) 20 MG capsule Take 20 mg by mouth daily.    . potassium chloride (K-DUR) 10 MEQ tablet Take 1 tablet (10 mEq total) by mouth daily. 90 tablet 1   No current facility-administered medications for this  visit.    Allergies:  Patient has no known allergies.   Social History: The patient  reports that he has been smoking cigarettes. He has been smoking about 0.50 packs per day. He has never used smokeless tobacco. He reports current alcohol use. He reports current drug use. Drugs: Heroin and Marijuana.   ROS:  Please see the history of present illness. Otherwise, complete review of systems is positive for none.  All other systems are reviewed and negative.   Physical Exam: VS:  BP (!) 159/85   Pulse 66   Ht 6' (1.829 m)   Wt 148 lb 9.6 oz (67.4 kg)   SpO2 99%   BMI 20.15 kg/m , BMI Body mass index is 20.15 kg/m.  Wt Readings from Last 3 Encounters:  07/19/19 148 lb 9.6 oz (67.4 kg)  06/06/19 147 lb (66.7 kg)  03/12/19 146 lb (66.2 kg)    General: Chronically ill-appearing male. HEENT: Conjunctiva and lids normal, wearing a mask. Neck: Supple, no elevated JVP or carotid bruits, no thyromegaly. Lungs: Clear to auscultation, nonlabored breathing at rest. Cardiac: Regular rate and rhythm, no S3, soft systolic murmur. Abdomen: Soft, nontender, bowel sounds present. Extremities: Lymphedema, healing ulcers on the feet, distal pulses 1-2+. Skin: Warm and dry. Musculoskeletal: Contracture deformities from prior stroke.  ECG:  An ECG dated 02/15/2017 was personally reviewed today and demonstrated:  Normal sinus rhythm.  Other Studies Reviewed Today:  Echocardiogram done at [facility] in mid August reported borderline LVH with LVEF 50 to 55% and normal diastolic function, mildly dilated left atrium, normal right ventricular chamber size, mildly sclerotic aortic valve with moderate aortic regurgitation, mild mitral regurgitation, mild tricuspid regurgitation, no pericardial effusion, mildly dilated aortic root at 4.27 cm.  Assessment and Plan:  1.  Leg swelling/lymphedema as noted previously.  LVEF 50 to 55% with normal diastolic function, vascular studies done per [facility] were not  felt to be contributing to symptoms.  I would recommend continued use of compression as he is doing, would also stay on Lasix 20 mg daily with potassium supplements, realizing that this could be further uptitrated if needed.  If his symptoms worsen I would suggest referring him to PT for additional efforts at mechanical compression.  He will keep follow-up with PCP for now.  2.  Moderate aortic regurgitation with mildly dilated aortic root.  Would suggest follow-up echocardiogram in 1 year.  Medication Adjustments/Labs and Tests Ordered: Current medicines are reviewed at length with the patient today.  Concerns regarding medicines are outlined above.   Tests Ordered: Orders Placed This Encounter  Procedures  . EKG 12-Lead    Medication Changes: No orders of the defined types were placed in this encounter.   Disposition:  Follow up prn  Signed, Satira Sark, MD, [facility] 07/19/2019 3:42 PM    [facility] [place], [place], [place] [address] Phone: [phone]; Fax: [phone]

## 2019-07-19 NOTE — Patient Instructions (Addendum)
Medication Instructions:   Your physician recommends that you continue on your current medications as directed. Please refer to the Current Medication list given to you today.  Labwork:  NONE  Testing/Procedures:  NONE  Follow-Up:  Your physician recommends that you schedule a follow-up appointment in: as needed.   Any Other Special Instructions Will Be Listed Below (If Applicable).  If you need a refill on your cardiac medications before your next appointment, please call your pharmacy. 

## 2019-08-10 ENCOUNTER — Ambulatory Visit (INDEPENDENT_AMBULATORY_CARE_PROVIDER_SITE_OTHER): Payer: BC Managed Care – PPO | Admitting: Neurology

## 2019-08-10 ENCOUNTER — Other Ambulatory Visit: Payer: Self-pay

## 2019-08-10 ENCOUNTER — Encounter: Payer: Self-pay | Admitting: Neurology

## 2019-08-10 VITALS — BP 172/100 | HR 102 | Ht 72.0 in | Wt 147.0 lb

## 2019-08-10 DIAGNOSIS — G8110 Spastic hemiplegia affecting unspecified side: Secondary | ICD-10-CM | POA: Diagnosis not present

## 2019-08-10 DIAGNOSIS — Z8673 Personal history of transient ischemic attack (TIA), and cerebral infarction without residual deficits: Secondary | ICD-10-CM

## 2019-08-10 DIAGNOSIS — G40009 Localization-related (focal) (partial) idiopathic epilepsy and epileptic syndromes with seizures of localized onset, not intractable, without status epilepticus: Secondary | ICD-10-CM

## 2019-08-10 MED ORDER — DIVALPROEX SODIUM 250 MG PO DR TAB: DELAYED_RELEASE_TABLET | ORAL | 11 refills | Status: DC

## 2019-08-10 NOTE — Patient Instructions (Addendum)
1. Schedule 1-hour EEG. Our office will schedule this today when you check out.  2. Increase Depakote 250mg : take 1 tablet in AM, 2 tablets in PM  3. Refer to Physical Medicine and Rehab for consideration for Botox. They will call you with an appointment date and time.  4. Follow-up in 3-4 months, call for any changes. This has been scheduled for 12/12/19 at 4pm.  Seizure Precautions: 1. If medication has been prescribed for you to prevent seizures, take it exactly as directed.  Do not stop taking the medicine without talking to your doctor first, even if you have not had a seizure in a long time.   2. Avoid activities in which a seizure would cause danger to yourself or to others.  Don't operate dangerous machinery, swim alone, or climb in high or dangerous places, such as on ladders, roofs, or girders.  Do not drive unless your doctor says you may.  3. If you have any warning that you may have a seizure, lay down in a safe place where you can't hurt yourself.    4.  No driving for 6 months from last seizure, as per [facility].   Please refer to the following link on the [place] website for more information: http://www.epilepsyfoundation.org/answerplace/Social/driving/drivingu.cfm   5.  Maintain good sleep hygiene. Avoid alcohol.  6.  Contact your doctor if you have any problems that may be related to the medicine you are taking.  7.  Call 911 and bring the patient back to the ED if:        A.  The seizure lasts longer than 5 minutes.       B.  The patient doesn't awaken shortly after the seizure  C.  The patient has new problems such as difficulty seeing, speaking or moving  D.  The patient was injured during the seizure  E.  The patient has a temperature over 102 F (39C)  F.  The patient vomited and now is having trouble breathing

## 2019-08-10 NOTE — Progress Notes (Signed)
NEUROLOGY CONSULTATION NOTE  Daniel Arroyo MRN: [id number] DOB: Jun 01, 1971  Referring provider: Arsenio Katz, NP Primary care provider: Arsenio Katz, NP  Reason for consult:  seizures  Thank you for your kind referral of Daniel Arroyo for consultation of the above symptoms. Although his history is well known to you, please allow me to reiterate it for the purpose of our medical record. The patient was accompanied to the clinic by his father who also provides collateral information. Records and images were personally reviewed where available.   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 Per notes, stroke was cryptogenic. He did endorse cocaine use around that time. He started having focal seizures in September 2019 where he describes left arm clonic activity that he cannot control. He has clonus but states the clonic activity is different. Face and leg are not affected. They last around 5 minutes, no clear worsened weakness or worsened numbness after.  He notices it more when he gets frustrated, stressed, or is struggling/straining with something. He usually takes a deep breath and the clonic activity would calm down. This was going on intermittently for a year until September 2020 while he was home alone, he started having the left arm clonic activity but this time he lost consciousness. He apparently grabbed his TV and the TV fell on top of him, fracturing his left arm. He woke up on the ground, no tongue bite or incontinence, and was brought to [facility]. He was told there was no new stroke. His PCP started him on low dose Depakote which initially quieted down the clonic activity, then  again recurred when he would get frustrated. He had a Depakote leve which was subtherapeutic, and dose was increased to 250mg  BID a couple of weeks ago. He denies any side effects. He denies any other episodes of gaps in time, olfactory/gustatory hallucinations. His father denies any staring/unresponsive episodes. He lives independently with family close by. He states his sleep is horrible and has not noticed any relation of seizures to poor sleep. He denies any alcohol intake in over a year, no illicit drug use since the stroke. He has been doing PT for his left arm fracture and notices some pain when his left arm is extended, but there is typically no pain. He is concerned that up until May/June 2020, he was more independent, walking daily to [place], but over the past few months his stamina and whole physical ability have decreased rapidly. He wonders about Botox helping with spasticity. He has also been dealing with bilateral lymphedema in his legs since late Spring/early Summer, with a wound on his right foot. He denies any headaches, dizziness, diplopia, dysphagia, back pain. He has neck pain and difficulty holding his head up. He has constipation due to Suboxone.   Epilepsy Risk Factors:  History of right MCA stroke. Otherwise he had a normal birth and early development.  There is no history of febrile convulsions, CNS infections such as meningitis/encephalitis, significant traumatic brain injury, neurosurgical procedures, or family history of seizures.  PAST MEDICAL HISTORY: Past Medical History:  Diagnosis Date  . ADHD, adult residual type   . Anxiety and depression   . Bipolar disorder ([place])   .  Depression   . Gastroesophageal reflux disease   . History of stroke 2018   Reportedly in the setting of substance abuse  . Knee pain   . Polysubstance abuse (HCC)   . Stroke [facility](HCC)     PAST SURGICAL HISTORY: Past Surgical History:  Procedure Laterality Date  . TONSILLECTOMY       MEDICATIONS: Current Outpatient Medications on File Prior to Visit  Medication Sig Dispense Refill  . ADDERALL XR 20 MG 24 hr capsule Take 20 mg by mouth daily.     . divalproex (DEPAKOTE) 250 MG DR tablet Take 250 mg by mouth daily.    . furosemide (LASIX) 20 MG tablet Take 1 tablet (20 mg total) by mouth daily. 90 tablet 1  . mirtazapine (REMERON) 15 MG tablet Take 1 tablet (15 mg total) by mouth at bedtime. 30 tablet 0  . naproxen (NAPROSYN) 500 MG tablet Take 500 mg by mouth as needed.     Marland Kitchen. omeprazole (PRILOSEC) 20 MG capsule Take 20 mg by mouth daily.    . potassium chloride (K-DUR) 10 MEQ tablet Take 1 tablet (10 mEq total) by mouth daily. 90 tablet 1   No current facility-administered medications on file prior to visit.     ALLERGIES: No Known Allergies  FAMILY HISTORY: Family History  Problem Relation Age of Onset  . Heart attack Mother        Died in early 6530's  . HIV Brother        Died in early 840's    SOCIAL HISTORY: Social History   Socioeconomic History  . Marital status: Divorced    Spouse name: Not on file  . Number of children: Not on file  . Years of education: Not on file  . Highest education level: Not on file  Occupational History  . Not on file  Social Needs  . Financial resource strain: Not on file  . Food insecurity    Worry: Not on file    Inability: Not on file  . Transportation needs    Medical: Not on file    Non-medical: Not on file  Tobacco Use  . Smoking status: Current Every Day Smoker    Packs/day: 0.50    Types: Cigarettes  . Smokeless tobacco: Never Used  Substance and Sexual Activity  . Alcohol use: Yes    Comment: in rehab   . Drug use: Yes    Types: Heroin, Marijuana    Comment: in rehab  . Sexual activity: Not on file  Lifestyle  . Physical activity    Days per week: Not on file    Minutes per session: Not on file  . Stress: Not on file  Relationships  . Social Musicianconnections    Talks on phone: Not on file    Gets  together: Not on file    Attends religious service: Not on file    Active member of club or organization: Not on file    Attends meetings of clubs or organizations: Not on file    Relationship status: Not on file  . Intimate partner violence    Fear of current or ex partner: Not on file    Emotionally abused: Not on file    Physically abused: Not on file    Forced sexual activity: Not on file  Other Topics Concern  . Not on file  Social History Narrative  . Not on file    REVIEW OF SYSTEMS: Constitutional: No fevers, chills, or sweats, no  generalized fatigue, change in appetite Eyes: No visual changes, double vision, eye pain Ear, nose and throat: No hearing loss, ear pain, nasal congestion, sore throat Cardiovascular: No chest pain, palpitations Respiratory:  No shortness of breath at rest or with exertion, wheezes GastrointestinaI: No nausea, vomiting, diarrhea, abdominal pain, fecal incontinence Genitourinary:  No dysuria, urinary retention or frequency Musculoskeletal:  + neck pain, back pain Integumentary: No rash, pruritus, +bipedal edem Neurological: as above Psychiatric: No depression, insomnia, anxiety Endocrine: No palpitations, fatigue, diaphoresis, mood swings, change in appetite, change in weight, increased thirst Hematologic/Lymphatic:  No anemia, purpura, petechiae. Allergic/Immunologic: no itchy/runny eyes, nasal congestion, recent allergic reactions, rashes  PHYSICAL EXAM: Vitals:   08/10/19 1404  BP: (!) 172/100  Pulse: (!) 102  SpO2: 98%   General: No acute distress Head:  Normocephalic/atraumatic. There is some cervical dystonia with head turned/tilted to the right. Skin/Extremities: No rash, +bipedal edema with both legs in compression stockings Neurological Exam: Mental status: alert and oriented to person, place, and time, no dysarthria or aphasia, Fund of knowledge is appropriate.  Recent and remote memory are intact.  Attention and concentration are  normal.    Able to name objects and repeat phrases. Cranial nerves: CN I: not tested CN II: pupils equal, round and reactive to light, visual fields intact CN III, IV, VI:  full range of motion, no nystagmus, no ptosis CN V: facial sensation reduced on left  CN VII: upper and lower face symmetric CN VIII: hearing intact to conversation CN IX, X: gag intact, uvula midline CN XI: sternocleidomastoid and trapezius muscles intact CN XII: tongue midline Bulk & Tone: increased on left UE and LE with spastic contracture at left elbow and wrist, no fasciculations. Motor: 5/5 on right UE and LE; 4/5 proximal left UE, spastic contracture at left elbow, 0/5 left wrist and finger extension Sensation: reduced pin and cold on left UE and LE Deep Tendon Reflexes: brisk +4 on left UE with clonus on left wrist extension, +3 on left LE, +2 on right UE and LE. 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 He continues to report focal motor seizures affecting the left arm, but has not had any loss of consciousness since 05/2019. We discussed increasing Depakote dose to 250mg  in AM, 500mg  in PM. EEG will be ordered as part of seizure workup. He had been pretty self-sufficient post-stroke, until the past 6-7 months, noting worsening mobility/ability to be more independent, and wonders if Botox is an option, he will be referred to [facility] for consideration of Botox. He does not drive. He was advised to keep a seizure calendar, follow-up in  3-4 months, he knows to call for any changes.   Thank you for allowing me to participate in the care of this patient. Please do not hesitate to call for any questions or concerns.   , M.D.  CC: , NP

## 2019-08-21 ENCOUNTER — Ambulatory Visit: Payer: BC Managed Care – PPO | Admitting: Neurology

## 2019-08-22 ENCOUNTER — Other Ambulatory Visit: Payer: Self-pay

## 2019-08-22 ENCOUNTER — Ambulatory Visit (INDEPENDENT_AMBULATORY_CARE_PROVIDER_SITE_OTHER): Payer: BC Managed Care – PPO | Admitting: Neurology

## 2019-08-22 ENCOUNTER — Telehealth: Payer: Self-pay | Admitting: Neurology

## 2019-08-22 DIAGNOSIS — Z8673 Personal history of transient ischemic attack (TIA), and cerebral infarction without residual deficits: Secondary | ICD-10-CM

## 2019-08-22 DIAGNOSIS — F1120 Opioid dependence, uncomplicated: Secondary | ICD-10-CM | POA: Diagnosis not present

## 2019-08-22 DIAGNOSIS — G40009 Localization-related (focal) (partial) idiopathic epilepsy and epileptic syndromes with seizures of localized onset, not intractable, without status epilepticus: Secondary | ICD-10-CM

## 2019-08-22 NOTE — Telephone Encounter (Signed)
Patient requests to speak with a nurse about scheduling Botox. He is being seen for an EEG today.

## 2019-08-22 NOTE — Telephone Encounter (Signed)
Left message for patient to call back with his questions.

## 2019-08-28 ENCOUNTER — Encounter: Payer: Self-pay | Admitting: Physical Medicine & Rehabilitation

## 2019-09-06 ENCOUNTER — Encounter: Payer: Self-pay | Admitting: Physical Medicine & Rehabilitation

## 2019-09-06 ENCOUNTER — Encounter: Payer: PPO | Attending: Physical Medicine & Rehabilitation | Admitting: Physical Medicine & Rehabilitation

## 2019-09-06 ENCOUNTER — Other Ambulatory Visit: Payer: Self-pay

## 2019-09-06 VITALS — BP 133/80 | HR 93 | Ht 72.0 in | Wt 145.0 lb

## 2019-09-06 DIAGNOSIS — G8110 Spastic hemiplegia affecting unspecified side: Secondary | ICD-10-CM | POA: Diagnosis not present

## 2019-09-06 NOTE — Progress Notes (Signed)
Subjective:    Patient ID: Daniel Arroyo, male    DOB: 1970/10/14, 48 y.o.   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  The patient had post stroke seizure disorder.  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 Right MCA infarct March 01 2017, pt states he had "extensive therapy" after the stroke.  The patient was at a skilled nursing facility after discharge from the hospital   Spasms started about 1 year ago initially lasted only a few minutes  Spasms became a lot more severe after he sustained a Left humerus fracture a couple months ago.  He states he was immobilized in an arm sling but did not have any surgery for his humeral fracture.  [facility] indicating close fracture of the neck of the left humerus. Pt feels his Left elbow and Left wrist/fingers are most affected In addition he feels like his head is pulling downward and towards the right side, this also started within the last few months and patient feels it may be related to sleeping in a recliner  Was able to hold medication bottle prior to the upper extremity fracture Pain Inventory Average Pain 3 Pain Right Now 3 My pain is other  In the last 24 hours, has pain interfered with the following? General activity 3 Relation with others 1 Enjoyment of life 3 What TIME of day is your pain at its worst? n/a Sleep (in general) Fair  Pain is worse with: some activites Pain improves with: ? Relief from Meds: 5  Mobility walk with assistance use a cane ability to climb steps?  yes do you drive?  no  Function Do you have any goals in this area?  yes  Neuro/Psych numbness tremor trouble walking  Prior Studies New  Physicians involved in your  care new   Family History  Problem Relation Age of Onset  . Heart attack Mother        Died in early 2630's  . HIV Brother        Died in early 8540's   Social History   Socioeconomic History  . Marital status: Divorced    Spouse name: Not on file  . Number of children: Not on file  . Years of education: Not on file  . Highest education level: Not on file  Occupational History  . Not on file  Tobacco Use  . Smoking status: Current Every Day Smoker    Packs/day: 0.50    Types: Cigarettes  . Smokeless tobacco: Never Used  Substance and Sexual Activity  . Alcohol use: Yes    Comment: in rehab   . Drug use: Yes    Types: Heroin, Marijuana    Comment: in rehab  . Sexual activity: Not on file  Other Topics Concern  . Not on file  Social History Narrative   Right handed      Lives with father   Social Determinants of Health   Financial Resource Strain:   . Difficulty of Paying Living Expenses: Not on file  Food Insecurity:   . Worried About Programme researcher, broadcasting/film/videounning Out of Food in the Last Year: Not on file  . Ran Out of Food in the Last Year: Not on file  Transportation Needs:   .  Lack of Transportation (Medical): Not on file  . Lack of Transportation (Non-Medical): Not on file  Physical Activity:   . Days of Exercise per Week: Not on file  . Minutes of Exercise per Session: Not on file  Stress:   . Feeling of Stress : Not on file  Social Connections:   . Frequency of Communication with Friends and Family: Not on file  . Frequency of Social Gatherings with Friends and Family: Not on file  . Attends Religious Services: Not on file  . Active Member of Clubs or Organizations: Not on file  . Attends Archivist Meetings: Not on file  . Marital Status: Not on file   Past Surgical History:  Procedure Laterality Date  . TONSILLECTOMY     Past Medical History:  Diagnosis Date  . ADHD, adult residual type   . Anxiety and depression   . Bipolar disorder ([place])   . Depression    . Gastroesophageal reflux disease   . History of stroke 2018   Reportedly in the setting of substance abuse  . Knee pain   . Polysubstance abuse ([place])   . Stroke ([place])    BP 133/80   Pulse 93   Ht 6' (1.829 m)   Wt 145 lb (65.8 kg)   SpO2 96%   BMI 19.67 kg/m   Opioid Risk Score:   Fall Risk Score:  `1  Depression screen PHQ 2/9  Depression screen PHQ 2/9 09/06/2019  Decreased Interest 1  Down, Depressed, Hopeless 0  PHQ - 2 Score 1  Altered sleeping 1  Tired, decreased energy 3  Change in appetite 1  Feeling bad or failure about yourself  0  Trouble concentrating 0  Moving slowly or fidgety/restless 0  Suicidal thoughts 0  PHQ-9 Score 6  Difficult doing work/chores Not difficult at all    Review of Systems  Constitutional: Negative.   HENT: Negative.   Eyes: Negative.   Respiratory: Negative.   Cardiovascular: Negative.   Gastrointestinal: Negative.   Endocrine: Negative.   Genitourinary: Negative.   Musculoskeletal: Positive for gait problem.       Spasticity  Allergic/Immunologic: Negative.   Neurological: Positive for tremors and numbness.  Hematological: Negative.   Psychiatric/Behavioral: Negative.   All other systems reviewed and are negative.      Objective:   Physical Exam Vitals and nursing note reviewed.  Constitutional:      Appearance: He is ill-appearing.  HENT:     Head: Normocephalic and atraumatic.  Eyes:     General: No visual field deficit.    Extraocular Movements: Extraocular movements intact.     Conjunctiva/sclera: Conjunctivae normal.     Pupils: Pupils are equal, round, and reactive to light.  Neck:     Comments: Patient with reduced cervical range of motion he is unable to look upward he is able to look downward and in fact his neck is positioned with head looking downward and tilted toward the right. anterocollis and right lateral Collis, no significant torticollis  There is tightness along the levator scapula  musculature prominence of the right sternocleidomastoid and some tenderness in the posterior cervical triangle  There is some cervical adenopathy posteriorly on the left side. Musculoskeletal:     Cervical back: Rigidity and tenderness present.  Lymphadenopathy:     Cervical: Cervical adenopathy present.  Neurological:     Mental Status: He is alert.     Cranial Nerves: No dysarthria.     Comments: Right  upper extremity strength 5/5 in the deltoid, bicep, tricep, grip right lower extremity strength 5/5 in the hip flexor knee extensor ankle dorsiflexor Left lower extremity strength 4 - at the left hip flexor knee extensor and 3 - at the ankle dorsiflexor Left upper extremity strength 3 - through approximately 20 degrees of shoulder abduction there is 3 - limited range at the elbow flexors 0 elbow extension 0 wrist extension 0 finger  extension.  Finger flexion is to minus  Tone left upper extremity MAS 3/4 in the elbow flexors MAS 3 at the finger flexors both FDP and FDS MAS 3 at the wrist flexor with prominence of FCR MAS 3 at the forearm pronators on the left  Psychiatric:        Mood and Affect: Mood normal.     Right  Lev scap- 50   There is clonus at the left wrist and finger flexors    Assessment & Plan:  #1.  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  He is functionally independent however his left upper limb spasticity has worsened after immobilization following left proximal humeral fracture.  His spasticity is interfering with functional usage of the left upper extremity he was able to use the left hand is a gross assist at one point but is no longer able to do so. The patient also is at risk for contracture at the left elbow and may in fact have a partial contracture.  I am able to range him out to about 150 degrees of extension  Patient has tried physical therapy as part of the rehab from his shoulder fracture without much improvement in his  tone.  Given the severity of his tone plus potential sedating effect of medication such as tizanidine or baclofen I do not think these are likely to be particularly effective  Would recommend botulinum toxin at the the following doses Botox units   Left elbow flexors Biceps 100 Brachiorad 50  Left Wrist flexor  25 Left FDP 25 Left FDS 50 PT  25  #2.  Cervical dystonia this is not usually part and parcel of CVA related spasticity but clearly has abnormal neck positioning and increased tone in the right levator scapula, right splenius capitis and right sternocleidomastoid Recommend botulinum toxin, Botox following doses  units Right  Splenius Cap- 50 RIght SCM 25 Right levator scapula 50

## 2019-09-06 NOTE — Patient Instructions (Signed)
OnabotulinumtoxinA injection (Medical Use) What is this medicine? ONABOTULINUMTOXINA (o na BOTT you lye num tox in eh) is a neuro-muscular blocker. This medicine is used to treat crossed eyes, eyelid spasms, severe neck muscle spasms, ankle and toe muscle spasms, and elbow, wrist, and finger muscle spasms. It is also used to treat excessive underarm sweating, to prevent chronic migraine headaches, and to treat loss of bladder control due to neurologic conditions such as multiple sclerosis or spinal cord injury. This medicine may be used for other purposes; ask your health care provider or pharmacist if you have questions. COMMON BRAND NAME(S): Botox What should I tell my health care provider before I take this medicine? They need to know if you have any of these conditions:  breathing problems  cerebral palsy spasms  difficulty urinating  heart problems  history of surgery where this medicine is going to be used  infection at the site where this medicine is going to be used  myasthenia gravis or other neurologic disease  nerve or muscle disease  surgery plans  take medicines that treat or prevent blood clots  thyroid problems  an unusual or allergic reaction to botulinum toxin, albumin, other medicines, foods, dyes, or preservatives  pregnant or trying to get pregnant  breast-feeding How should I use this medicine? This medicine is for injection into a muscle. It is given by a health care professional in a hospital or clinic setting. Talk to your pediatrician regarding the use of this medicine in children. While this drug may be prescribed for children as young as 2 years old for selected conditions, precautions do apply. Overdosage: If you think you have taken too much of this medicine contact a poison control center or emergency room at once. NOTE: This medicine is only for you. Do not share this medicine with others. What if I miss a dose? This does not apply. What may  interact with this medicine?  aminoglycoside antibiotics like gentamicin, neomycin, tobramycin  muscle relaxants  other botulinum toxin injections This list may not describe all possible interactions. Give your health care provider a list of all the medicines, herbs, non-prescription drugs, or dietary supplements you use. Also tell them if you smoke, drink alcohol, or use illegal drugs. Some items may interact with your medicine. What should I watch for while using this medicine? Visit your doctor for regular check ups. This medicine will cause weakness in the muscle where it is injected. Tell your doctor if you feel unusually weak in other muscles. Get medical help right away if you have problems with breathing, swallowing, or talking. This medicine might make your eyelids droop or make you see blurry or double. If you have weak muscles or trouble seeing do not drive a car, use machinery, or do other dangerous activities. This medicine contains albumin from human blood. It may be possible to pass an infection in this medicine, but no cases have been reported. Talk to your doctor about the risks and benefits of this medicine. If your activities have been limited by your condition, go back to your regular routine slowly after treatment with this medicine. What side effects may I notice from receiving this medicine? Side effects that you should report to your doctor or health care professional as soon as possible:  allergic reactions like skin rash, itching or hives, swelling of the face, lips, or tongue  breathing problems  changes in vision  chest pain or tightness  eye irritation, pain  fast, irregular heartbeat    infection  numbness  speech problems  swallowing problems  unusual weakness Side effects that usually do not require medical attention (report to your doctor or health care professional if they continue or are bothersome):  bruising or pain at site where  injected  drooping eyelid  dry eyes or mouth  headache  muscles aches, pains  sensitivity to light  tearing This list may not describe all possible side effects. Call your doctor for medical advice about side effects. You may report side effects to FDA at 1-800-FDA-1088. Where should I keep my medicine? This drug is given in a hospital or clinic and will not be stored at home. NOTE: This sheet is a summary. It may not cover all possible information. If you have questions about this medicine, talk to your doctor, pharmacist, or health care provider.  2020 Elsevier/Gold Standard (2018-03-13 14:21:42)  

## 2019-09-07 DIAGNOSIS — G8110 Spastic hemiplegia affecting unspecified side: Secondary | ICD-10-CM | POA: Insufficient documentation

## 2019-09-07 NOTE — Procedures (Signed)
ELECTROENCEPHALOGRAM REPORT  Date of Study: 08/22/2019  Patient's Name: Daniel Arroyo MRN: [id number] Date of Birth: 1970-10-20  Referring Provider: Dr. Ellouise Newer  Clinical History: This is a 48 year old man with a history of right MCA stroke and subsequent focal motor seizures that rarely generalize.  Medications: DEPAKOTE 250 MG DR tablet ADDERALL XR 20 MG 24 hr capsule LASIX 20 MG tablet REMERON 15 MG tablet NAPROSYN 500 MG tablet PRILOSEC 20 MG capsule K-DUR 10 MEQ tablet  Technical Summary: A multichannel digital 1-hour EEG recording measured by the international 10-20 system with electrodes applied with paste and impedances below 5000 ohms performed in our laboratory with EKG monitoring in an awake and asleep patient.  Hyperventilation was not performed. Photic stimulation was performed.  The digital EEG was referentially recorded, reformatted, and digitally filtered in a variety of bipolar and referential montages for optimal display.    Description: The patient is awake and asleep during the recording.  During maximal wakefulness, there is a symmetric, medium voltage 10 Hz posterior dominant rhythm that attenuates with eye opening.  There is frequent focal theta and delta slowing seen over the right hemisphere. During drowsiness and sleep, there is an increase in theta slowing of the background with vertex waves seen.  Photic stimulation did not elicit any abnormalities.  There were no epileptiform discharges or electrographic seizures seen.    EKG lead was unremarkable.  Impression: This 1-hour awake and asleep EEG is abnormal due to occasional focal slowing over the right hemisphere.  Clinical Correlation of the above findings indicates focal cerebral dysfunction over the right hemisphere consistent with prior stroke in this region. The absence of epileptiform discharges does not exclude a clinical diagnosis of epilepsy. Clinical correlation is advised.    Ellouise Newer, M.D.

## 2019-09-10 ENCOUNTER — Telehealth: Payer: Self-pay

## 2019-09-10 NOTE — Telephone Encounter (Signed)
-----   Message from Cameron Sprang, MD sent at 09/09/2019  6:47 PM EST ----- Pls let him know the EEG did not show any seizure discharges, it showed changes consistent with his prior stroke. Continue Depakote. Thanks

## 2019-09-10 NOTE — Telephone Encounter (Signed)
Pt informed of results and happy. Made aware to continue on Depakote.

## 2019-09-26 DIAGNOSIS — F1120 Opioid dependence, uncomplicated: Secondary | ICD-10-CM | POA: Diagnosis not present

## 2019-10-04 ENCOUNTER — Encounter: Payer: Self-pay | Admitting: Physical Medicine & Rehabilitation

## 2019-10-04 ENCOUNTER — Encounter: Payer: PPO | Attending: Physical Medicine & Rehabilitation | Admitting: Physical Medicine & Rehabilitation

## 2019-10-04 ENCOUNTER — Other Ambulatory Visit: Payer: Self-pay

## 2019-10-04 VITALS — BP 143/85 | HR 74 | Temp 97.7°F | Ht 72.0 in | Wt 145.0 lb

## 2019-10-04 DIAGNOSIS — G8110 Spastic hemiplegia affecting unspecified side: Secondary | ICD-10-CM | POA: Insufficient documentation

## 2019-10-04 DIAGNOSIS — G243 Spasmodic torticollis: Secondary | ICD-10-CM | POA: Diagnosis not present

## 2019-10-04 NOTE — Progress Notes (Signed)
Botox Injection for spasticity using needle EMG guidance G 81.10 64644  Dilution: 50 units/ml Indication: Severe spasticity which interferes with ADL,mobility and/or  hygiene and is unresponsive to medication management and other conservative care Informed consent was obtained after describing risks and benefits of the procedure with the patient. This includes bleeding, bruising, infection, excessive weakness, or medication side effects. A REMS form is on file and signed. Needle: 27-gauge 1 inch needle electrode Number of units per muscle Left side Biceps 100 units Brachial radialis 50 units Flexor carpi radialis 25 units Flexor digitorum superficialis 50 units Flexor digitorum profundus 25 units Pronator teres 25 units All injections were done after obtaining appropriate EMG activity and after negative drawback for blood. The patient tolerated the procedure well. Post procedure instructions were given. A followup appointment was made.

## 2019-10-04 NOTE — Progress Notes (Signed)
Botulinum toxin injection for cervical dystonia CPT code 46568 Diagnosis code G 24.3 Indication is cervical dystonia that has not responded to conservative care and interferes with activities of daily living as well as cervical range of motion. Chronic cervical pain not relieved by other treatments.  Informed consent was obtained after describing risks and benefits of the procedure with the patient this included bleeding bruising and infection The patient elects to proceed and has given Written consent.  REMS form completed  Patient placed in a seated position A 27-gauge 1 inch needle electrode was used to guide the injection under EMG guidance.  Muscles and dosing: Right splenius capitis 25 units x 2 = 50 units Right sternocleidomastoid 25 units Right levator scapula 25 units x 2= 50 units   All injections done after negative drawback for blood. Patient tolerated procedure well. Post procedure instructions given. Follow up appointment made

## 2019-10-04 NOTE — Patient Instructions (Signed)

## 2019-10-15 DIAGNOSIS — Z6823 Body mass index (BMI) 23.0-23.9, adult: Secondary | ICD-10-CM | POA: Diagnosis not present

## 2019-10-15 DIAGNOSIS — Z299 Encounter for prophylactic measures, unspecified: Secondary | ICD-10-CM | POA: Diagnosis not present

## 2019-10-15 DIAGNOSIS — Z2821 Immunization not carried out because of patient refusal: Secondary | ICD-10-CM | POA: Diagnosis not present

## 2019-10-15 DIAGNOSIS — F909 Attention-deficit hyperactivity disorder, unspecified type: Secondary | ICD-10-CM | POA: Diagnosis not present

## 2019-10-15 DIAGNOSIS — I10 Essential (primary) hypertension: Secondary | ICD-10-CM | POA: Diagnosis not present

## 2019-10-15 DIAGNOSIS — M79673 Pain in unspecified foot: Secondary | ICD-10-CM | POA: Diagnosis not present

## 2019-10-15 DIAGNOSIS — R569 Unspecified convulsions: Secondary | ICD-10-CM | POA: Diagnosis not present

## 2019-10-15 DIAGNOSIS — F17210 Nicotine dependence, cigarettes, uncomplicated: Secondary | ICD-10-CM | POA: Diagnosis not present

## 2019-10-24 DIAGNOSIS — F900 Attention-deficit hyperactivity disorder, predominantly inattentive type: Secondary | ICD-10-CM | POA: Diagnosis not present

## 2019-10-24 DIAGNOSIS — F1120 Opioid dependence, uncomplicated: Secondary | ICD-10-CM | POA: Diagnosis not present

## 2019-11-15 ENCOUNTER — Encounter: Payer: PPO | Attending: Physical Medicine & Rehabilitation | Admitting: Physical Medicine & Rehabilitation

## 2019-11-15 ENCOUNTER — Other Ambulatory Visit: Payer: Self-pay

## 2019-11-15 ENCOUNTER — Encounter: Payer: Self-pay | Admitting: Physical Medicine & Rehabilitation

## 2019-11-15 VITALS — BP 125/81 | HR 100 | Temp 98.1°F | Ht 70.0 in | Wt 139.0 lb

## 2019-11-15 DIAGNOSIS — G8110 Spastic hemiplegia affecting unspecified side: Secondary | ICD-10-CM | POA: Diagnosis not present

## 2019-11-15 DIAGNOSIS — G243 Spasmodic torticollis: Secondary | ICD-10-CM | POA: Diagnosis not present

## 2019-11-15 NOTE — Progress Notes (Signed)
Subjective:    Patient ID: Daniel Arroyo, male    DOB: September 25, 1970, 49 y.o.   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  The patient had post stroke seizure disorder.  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 Right MCA infarct March 01 2017, pt states he had "extensive therapy" after the stroke.  The patient was at a skilled nursing facility after discharge from the hospital HPI The patient also had a right humeral fracture 05/18/2019, surgical neck treated nonoperatively.  Unfortunately afterwards the patient developed increasing spasms in his neck mainly on the right side.  He underwent botulinum toxin injection of splenius capitis, levator scapula as well as sternocleidomastoid on the right side 125 units total about 10 days after the procedure he had an episode of dysphagia that lasted 3 to 4 days but cleared up after doing some dysphagia exercises that he learned in therapy.  In terms of his left upper extremity botulinum toxin injection he feels like it did not help much with his elbow.  Patient noticed some literature on medications for opioid-induced constipation.  He states he has this problem and has been on Suboxone from a Suboxone clinic.  He is requesting samples and information Pain Inventory Average Pain 5 Pain Right Now 4 My pain is aching  In the last 24 hours, has pain interfered with the following? General activity 6 Relation with others 6 Enjoyment of life 6 What TIME of day is your pain at its worst? all Sleep (in general) Good  Pain is worse with: walking, bending and some activites Pain improves with: nothing Relief from Meds: 0  Mobility walk with assistance use a cane ability to climb steps?  yes do you drive?  no  Function disabled: date disabled . I need assistance with the following:   dressing  Neuro/Psych bowel control problems weakness tingling trouble walking spasms anxiety  Prior Studies Any changes since last visit?  no  Physicians involved in your care Any changes since last visit?  no   Family History  Problem Relation Age of Onset  . Heart attack Mother        Died in early 7's  . HIV Brother        Died in early 67's   Social History   Socioeconomic History  . Marital status: Divorced    Spouse name: Not on file  . Number of children: Not on file  . Years of education: Not on file  . Highest education level: Not on file  Occupational History  . Not on file  Tobacco Use  . Smoking status: Current Every Day Smoker    Packs/day: 0.50    Types: Cigarettes  . Smokeless tobacco: Never Used  Substance and Sexual Activity  . Alcohol use: Yes    Comment: in rehab   . Drug use: Yes    Types: Heroin, Marijuana    Comment: in rehab  . Sexual activity: Not on file  Other Topics Concern  . Not on file  Social History Narrative   Right handed      Lives with father   Social Determinants of Health   Financial Resource Strain:   . Difficulty of Paying Living Expenses: Not on file  Food Insecurity:   . Worried About Charity fundraiser in the Last Year: Not on file  .  Ran Out of Food in the Last Year: Not on file  Transportation Needs:   . Lack of Transportation (Medical): Not on file  . Lack of Transportation (Non-Medical): Not on file  Physical Activity:   . Days of Exercise per Week: Not on file  . Minutes of Exercise per Session: Not on file  Stress:   . Feeling of Stress : Not on file  Social Connections:   . Frequency of Communication with Friends and Family: Not on file  . Frequency of Social Gatherings with Friends and Family: Not on file  . Attends Religious Services: Not on file  . Active Member of Clubs or Organizations: Not on file  . Attends Banker Meetings: Not on file  . Marital Status: Not on file    Past Surgical History:  Procedure Laterality Date  . TONSILLECTOMY     Past Medical History:  Diagnosis Date  . ADHD, adult residual type   . Anxiety and depression   . Bipolar disorder (HCC)   . Depression   . Gastroesophageal reflux disease   . History of stroke 2018   Reportedly in the setting of substance abuse  . Knee pain   . Polysubstance abuse (HCC)   . Stroke (HCC)    Temp 98.1 F (36.7 C)   Ht 5\' 10"  (1.778 m)   Wt 139 lb (63 kg)   BMI 19.94 kg/m   Opioid Risk Score:   Fall Risk Score:  `1  Depression screen PHQ 2/9  Depression screen PHQ 2/9 09/06/2019  Decreased Interest 1  Down, Depressed, Hopeless 0  PHQ - 2 Score 1  Altered sleeping 1  Tired, decreased energy 3  Change in appetite 1  Feeling bad or failure about yourself  0  Trouble concentrating 0  Moving slowly or fidgety/restless 0  Suicidal thoughts 0  PHQ-9 Score 6  Difficult doing work/chores Not difficult at all    Review of Systems  Constitutional: Positive for appetite change.  HENT: Negative.   Eyes: Negative.   Respiratory: Negative.   Cardiovascular: Negative.   Gastrointestinal: Positive for constipation.  Endocrine: Negative.   Genitourinary: Negative.   Musculoskeletal: Positive for gait problem, neck pain and neck stiffness.  Skin: Positive for wound.  Allergic/Immunologic: Negative.   Neurological: Positive for headaches.       Tingling   Hematological: Negative.   Psychiatric/Behavioral: The patient is nervous/anxious.   All other systems reviewed and are negative.      Objective:   Physical Exam Vitals and nursing note reviewed.  Constitutional:      Appearance: He is ill-appearing.  HENT:     Head: Normocephalic and atraumatic.  Eyes:     Comments: Eyelid edema bilaterally  Musculoskeletal:     Cervical back: Rigidity present. No tenderness.  Neurological:     Mental Status: He is alert.    motor strength is essentially 0/5 in left upper  extremity Tone MAS 4 in the left elbow flexor MAS 3 in the finger wrist flexor on the left side Normal tone and normal strength in the right upper extremity Cervical spine has right lateral Collis Increased tone in the lateral neck musculature with prominence of the levator scapula trapezius   Ambulates with a cane modified independent level Speech with mild dysarthria, unchanged     Assessment & Plan:  #1.  Left spastic hemiplegia secondary to right MCA distribution infarct from 2018.  His left elbow flexor likely has contracture therefore did not  respond well to the botulinum toxin injection.  His range of motion passively in the left wrist and finger flexors is fair and may respond to Botox. Consider OT after repeat Botox injection to the  following muscle groups Left FCU 50 Left FCR 50 Left FDP 50 Left FDS 50 Left pronator teres 50  2.  Cervical dystonia No significant provement after botulinum toxin injection, likely underdosed in the posterior cervical muscle groups.  The transient dysphagia that he had after the injection is shorter than what would be expected as a side effect of the botulinum toxin.  Only one of the injections was done in the anterior cervical musculature i.e. the sternocleidomastoid.  Will not reinject this muscle group Plan is to inject Right levator scapula 75 units Right splenius capitis 75 units  #3.  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  Have given patient samples of Movantik 12.5 mg/day number 3 tablets as well as Movantik 25 mg/day number 3 tablets.  If the 12.5 mg is helpful in inducing a normal bowel movement he can cut the 25 mg in half and take 1/2 tablet every morning. He may ask his primary care provider for a prescription if this is a helpful option for him Educational material has been provided

## 2019-11-15 NOTE — Patient Instructions (Signed)
Naloxegol oral tablets What is this medicine? NALOXEGOL (nal OX e GAHL) is used to treat constipation caused by opioids (pain medicine). Tell your health care professional if you stop taking your opioid pain medicine. This medicine may be used for other purposes; ask your health care provider or pharmacist if you have questions. COMMON BRAND NAME(S): MOVANTIK What should I tell my health care provider before I take this medicine? They need to know if you have any of these conditions:  cancer or tumor in abdomen, intestine, or stomach  diverticulitis  history of bowel blockage  inflammatory bowel disease  kidney disease  liver disease  recent surgery on the stomach or intestine  stomach or intestine problems  taking bevacizumab  an unusual or allergic reaction to naloxegol, other medicines, foods, dyes, or preservatives  pregnant or trying to get pregnant  breast-feeding How should I use this medicine? Take this medicine by mouth with a glass of water. Follow the directions on the prescription label. Swallow medicine whole. Do not cut or chew this medicine. The medicine may also be crushed and mixed with 4 ounces of water. Drink immediately after mixing. Take your medicine one time each day, on an empty stomach, at least 1 hour before your first meal of the day or 2 hours after the meal. If you are unable to tolerate your dose, inform your healthcare provider so that your dose can be adjusted. Do not take additional laxatives except on your healthcare provider's advice. Your healthcare provider may prescribe other laxatives if your medicine does not work well enough after 3 days of treatment. Tell your healthcare provider if you stop taking your pain medicine. If you stop taking your pain medicine, you should also stop taking this medicine. Do not take your medicine more often than directed. Do not stop taking except on your doctor's advice. A special MedGuide will be given to you  before each treatment. Be sure to read this information carefully each time. Talk to your pediatrician regarding the use of this medicine in children. Special care may be needed. Overdosage: If you think you have taken too much of this medicine contact a poison control center or emergency room at once. NOTE: This medicine is only for you. Do not share this medicine with others. What if I miss a dose? If you miss a dose, take it as soon as you can. If it is almost time for your next dose, take only that dose. Do not take double or extra doses. What may interact with this medicine? Do not take this medicine with any of the following medications:  certain antiviral medicines for HIV or AIDS  certain medicines for fungal infections like ketoconazole, itraconazole, posaconazole, and voriconazole  chloramphenicol  clarithromycin  conivaptan  dalfopristin; quinupristin  idelalisib  mifepristone  nefazodone  telithromycin This medicine may also interact with the following medications:  bevacizumab  carbamazepine  diltiazem  erythromycin  grapefruit juice  methylnaltrexone  naloxone  naltrexone  rifampin  St. John's Wort  verapamil This list may not describe all possible interactions. Give your health care provider a list of all the medicines, herbs, non-prescription drugs, or dietary supplements you use. Also tell them if you smoke, drink alcohol, or use illegal drugs. Some items may interact with your medicine. What should I watch for while using this medicine? Visit your doctor for regular check ups. Tell your doctor if your symptoms do not get better or if they get worse. If you develop unusually persistent   or worsening abdominal pain, stop taking your medicine and seek medical attention. You may have symptoms of opioid withdrawal during treatment with this medicine. Symptoms include sweating, chills, diarrhea, stomach pain, anxiety, irritability, and yawning. Tell  your health care provider if you have any of these symptoms. Also, if you take methadone to treat your pain, you may be more likely to have stomach pain and diarrhea compared to people who do not take methadone. If you take too much of this medicine, call your healthcare provider or go to the nearest emergency room right away. What side effects may I notice from receiving this medicine? Side effects that you should report to your doctor or health care professional as soon as possible:  allergic reactions like skin rash, itching or hives, swelling of the face, lips, or tongue  new or worsening stomach pain  severe or prolonged diarrhea  signs and symptoms of opioid withdrawal such as sweating, chills, diarrhea, stomach pain, anxiety, irritability, and yawning Side effects that usually do not require medical attention (report to your doctor or health care professional if they continue or are bothersome):  diarrhea  headache  nausea  stomach gas  stomach pain  vomiting This list may not describe all possible side effects. Call your doctor for medical advice about side effects. You may report side effects to FDA at 1-800-FDA-1088. Where should I keep my medicine? Keep out of the reach of children. This medicine can be abused. Keep your medicine in a safe place to protect it from theft. Do not share this medicine with anyone. Selling or giving away this medicine is dangerous and against the law. Follow the directions in the MedGuide. Store at room temperature between 20 and 25 degrees C (68 and 77 degrees F). Throw away any unused medicine after the expiration date. NOTE: This sheet is a summary. It may not cover all possible information. If you have questions about this medicine, talk to your doctor, pharmacist, or health care provider.  2020 [employer]vier/Gold Standard (2019-01-04 17:12:41)  

## 2019-11-19 ENCOUNTER — Ambulatory Visit: Payer: PPO | Admitting: Physician Assistant

## 2019-11-22 ENCOUNTER — Other Ambulatory Visit: Payer: Self-pay

## 2019-11-22 ENCOUNTER — Encounter (HOSPITAL_BASED_OUTPATIENT_CLINIC_OR_DEPARTMENT_OTHER): Payer: PPO | Attending: Internal Medicine | Admitting: Internal Medicine

## 2019-11-22 DIAGNOSIS — Z8673 Personal history of transient ischemic attack (TIA), and cerebral infarction without residual deficits: Secondary | ICD-10-CM | POA: Diagnosis not present

## 2019-11-22 DIAGNOSIS — I872 Venous insufficiency (chronic) (peripheral): Secondary | ICD-10-CM | POA: Diagnosis not present

## 2019-11-22 DIAGNOSIS — Z09 Encounter for follow-up examination after completed treatment for conditions other than malignant neoplasm: Secondary | ICD-10-CM | POA: Insufficient documentation

## 2019-11-22 DIAGNOSIS — L97311 Non-pressure chronic ulcer of right ankle limited to breakdown of skin: Secondary | ICD-10-CM | POA: Diagnosis not present

## 2019-11-22 DIAGNOSIS — L97511 Non-pressure chronic ulcer of other part of right foot limited to breakdown of skin: Secondary | ICD-10-CM | POA: Diagnosis not present

## 2019-11-22 DIAGNOSIS — S91301A Unspecified open wound, right foot, initial encounter: Secondary | ICD-10-CM | POA: Diagnosis not present

## 2019-11-22 DIAGNOSIS — I351 Nonrheumatic aortic (valve) insufficiency: Secondary | ICD-10-CM | POA: Insufficient documentation

## 2019-11-22 DIAGNOSIS — G40909 Epilepsy, unspecified, not intractable, without status epilepticus: Secondary | ICD-10-CM | POA: Insufficient documentation

## 2019-11-22 DIAGNOSIS — G8220 Paraplegia, unspecified: Secondary | ICD-10-CM | POA: Diagnosis not present

## 2019-11-22 NOTE — Progress Notes (Addendum)
TIN, ENGRAM ([id number]) Visit Report for 11/22/2019 Chief Complaint Document Details Patient Name: Date of Service: Daniel Arroyo, Daniel Arroyo 11/22/2019 2:45 PM Medical Record Number:[id number] Patient Account Number: [id number] Date of Birth/Sex: Treating RN: [birth year] (49 y.o. [name]: Salley Slaughter Other Clinician: Referring Provider: Treating Provider/Extender:Tyrese Ficek, Ronita Hipps, Corliss Blacker in Treatment: 0 Information Obtained from: Patient Chief Complaint 11/22/2019; the patient is here for review of the wound on his right medial ankle and medial dorsal foot Electronic Signature(s) Signed: 11/22/2019 5:41:03 PM By: Baltazar Najjar MD Entered By: Baltazar Najjar on 11/22/2019 16:30:57 -------------------------------------------------------------------------------- Debridement Details Patient Name: Date of Service: Daniel, Arroyo 11/22/2019 2:45 PM Medical Record [id number] Patient Account Number: [id number] Date of Birth/Sex: Treating RN: 07-08-1971 (49 y.o. [name]: Salley Slaughter Other Clinician: Referring Provider: Treating Provider/Extender:Brezlyn Manrique, Ronita Hipps, Marylene Land Weeks in Treatment: 0 Debridement Performed for Wound #1 Right Malleolus Assessment: Performed By: Physician Maxwell Caul., MD Debridement Type: Debridement Severity of Tissue Pre Limited to breakdown of skin Debridement: Level of Consciousness (Pre- Awake and Alert procedure): Pre-procedure Verification/Time Out Taken: Yes - 16:00 Start Time: 16:01 Total Area Debrided (L x W): 3 (cm) x 4 (cm) = 12 (cm) Tissue and other material Viable, Non-Viable, Skin: Dermis debrided: Level: Skin/Dermis Debridement Description: Selective/Open Wound Instrument: Curette Bleeding: Minimum Hemostasis Achieved: Pressure End Time: 16:03 Procedural Pain: 0 Post Procedural Pain: 2 Response to Treatment: Procedure was tolerated well Level of  Consciousness Awake and Alert (Post-procedure): Post Debridement Measurements of Total Wound Length: (cm) 3 Width: (cm) 4 Depth: (cm) 0.1 Volume: (cm) 0.942 Character of Wound/Ulcer Post Requires Further Debridement Debridement: Severity of Tissue Post Debridement: Limited to breakdown of skin Post Procedure Diagnosis Same as Pre-procedure Electronic Signature(s) Signed: 11/22/2019 5:41:03 PM By: Baltazar Najjar MD Signed: 11/22/2019 6:05:23 PM By: Shawn Stall Entered By: Baltazar Najjar on 11/22/2019 16:29:05 -------------------------------------------------------------------------------- Debridement Details Patient Name: Date of Service: Daniel, Arroyo 11/22/2019 2:45 PM Medical Record [id number] Patient Account Number: [id number] Date of Birth/Sex: Treating RN: 09/03/1971 (49 y.o. [name]: Salley Slaughter Other Clinician: Referring Provider: Treating Provider/Extender:Launa Goedken, Ronita Hipps, Marylene Land Weeks in Treatment: 0 Debridement Performed for Wound #2 Right,Distal,Medial Foot Assessment: Performed By: Physician Maxwell Caul., MD Debridement Type: Debridement Severity of Tissue Pre Limited to breakdown of skin Debridement: Level of Consciousness (Pre- Awake and Alert procedure): Pre-procedure Verification/Time Out Taken: Yes - 16:00 Start Time: 16:01 Pain Control: Lidocaine 5% topical ointment Total Area Debrided (L x W): 2.5 (cm) x 3.1 (cm) = 7.75 (cm) Tissue and other material Viable, Non-Viable, Skin: Dermis debrided: Level: Skin/Dermis Debridement Description: Selective/Open Wound Instrument: Curette Bleeding: Minimum Hemostasis Achieved: Pressure End Time: 16:03 Procedural Pain: 0 Post Procedural Pain: 2 Response to Treatment: Procedure was tolerated well Level of Consciousness Awake and Alert (Post-procedure): Post Debridement Measurements of Total Wound Length: (cm) 2.5 Width: (cm) 3.1 Depth: (cm)  0.1 Volume: (cm) 0.609 Character of Wound/Ulcer Post Requires Further Debridement Debridement: Severity of Tissue Post Debridement: Limited to breakdown of skin Post Procedure Diagnosis Same as Pre-procedure Electronic Signature(s) Signed: 11/22/2019 5:41:03 PM By: Baltazar Najjar MD Signed: 11/22/2019 6:05:23 PM By: Shawn Stall Entered By: Baltazar Najjar on 11/22/2019 16:29:12 -------------------------------------------------------------------------------- Debridement Details Patient Name: Date of Service: Daniel, Arroyo 11/22/2019 2:45 PM Medical Record [id number] Patient Account Number: [id number] Date of Birth/Sex: Treating RN: [birth year] (49 y.o. [name]: Salley Slaughter Other Clinician: Referring Provider: Treating Provider/Extender:Allesha Aronoff, Ronita Hipps, Marylene Land Weeks in Treatment: 0 Debridement Performed  for Wound #3 Right,Medial,Dorsal Foot Assessment: Performed By: Physician Maxwell Caul., MD Debridement Type: Debridement Severity of Tissue Pre Limited to breakdown of skin Debridement: Level of Consciousness (Pre- Awake and Alert procedure): Pre-procedure Verification/Time Out Taken: Yes - 16:00 Start Time: 16:01 Pain Control: Lidocaine 5% topical ointment Total Area Debrided (L x W): 0.5 (cm) x 4.5 (cm) = 2.25 (cm) Tissue and other material Viable, Non-Viable, Skin: Dermis Viable, Non-Viable, Skin: Dermis debrided: Level: Skin/Dermis Debridement Description: Selective/Open Wound Instrument: Curette Bleeding: Minimum Hemostasis Achieved: Pressure End Time: 16:03 Procedural Pain: 0 Post Procedural Pain: 2 Response to Treatment: Procedure was tolerated well Level of Consciousness Awake and Alert (Post-procedure): Post Debridement Measurements of Total Wound Length: (cm) 0.7 Width: (cm) 4.5 Depth: (cm) 0.1 Volume: (cm) 0.247 Character of Wound/Ulcer Post Requires Further Debridement Debridement: Severity of  Tissue Post Debridement: Limited to breakdown of skin Post Procedure Diagnosis Same as Pre-procedure Electronic Signature(s) Signed: 11/22/2019 5:41:03 PM By: Baltazar Najjar MD Signed: 11/22/2019 6:05:23 PM By: Shawn Stall Entered By: Baltazar Najjar on 11/22/2019 16:29:19 -------------------------------------------------------------------------------- HPI Details Patient Name: Date of Service: EANN, CLELAND 11/22/2019 2:45 PM Medical Record [id number] Patient Account Number: [id number] Date of Birth/Sex: Treating RN: 1971-01-14 (48 y.o. [name]: Salley Slaughter Other Clinician: Referring Provider: Treating Provider/Extender:Taher Vannote, Ronita Hipps, Corliss Blacker in Treatment: 0 History of Present Illness HPI Description: ADMISSION 11/22/2019 This is a 49 year old man who has a history of chronic venous insufficiency and lymphedema. He has been dealing with wounds in his right medial malleolus for several months. He was actually seen by Dr. Lynden Ang at the wound care center. They were using silver alginate with his own juxta lite stockings. The patient states that the wounds fluctuated between improving and then worsening. He was seen by Dr. Myra [facility] in June 2020. He had arterial arterial studies that showed triphasic waveforms on the right with noncompressible ABIs at 1.39 however great toe pressure was normal at 0.84. There does not seem to provide a lot of evidence that there was an arterial component. He also had a DVT rule out study which was negative for DVT there was no evidence of superficial venous thrombosis. Dr. Darryl Lent did not think he had an arterial pathology. He put him in 20/30 compression stockings. I am a bit surprised he did not do reflux studies. The patient states the wounds are painful he uses Tylenol and aspirin. 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 Electronic Signature(s) Signed: 11/22/2019 5:41:03 PM By: Baltazar Najjar MD Entered By: Baltazar Najjar on 11/22/2019 16:35:29 -------------------------------------------------------------------------------- Physical Exam Details Patient Name: Date of Service: AHIJAH, DEVERY 11/22/2019 2:45 PM Medical Record [id number] Patient Account Number: [id number] Date of Birth/Sex: Treating RN: [birth year] (48 y.o. [name]: Salley Slaughter Other Clinician: Referring Provider: Treating Provider/Extender:Jaquane Boughner, Ronita Hipps, Marylene Land Weeks in Treatment: 0 Constitutional Patient is hypertensive.. Pulse regular and within target range for patient.Marland Kitchen Respirations regular, non-labored and within target range.. Temperature is normal and within the target range for the patient.Marland Kitchen Appears in no distress. Respiratory work of breathing is normal. Bilateral breath sounds are clear and equal in all lobes with no wheezes, rales or rhonchi.. Cardiovascular Heart rhythm and rate regular, without murmur or gallop.. Popliteal pulses  palpable on the right. Pedal pulses are easily palpable on the right. the patient has mild edema on the right leg severe hemosiderin deposition involving the foot ankle and calf especially medially.. Integumentary (Hair, Skin) Very marked hemosiderin deposition on the right medial foot ankle and leg. Psychiatric appears at normal baseline. Notes Wound exam; the patient has 3 wounds to larger areas on the right medial malleolus the right medial foot and calcaneus and what was apparently a sock or stocking injury on the right dorsal foot just dorsal to the ankle crease. The areas are very superficial. Flaking skin on the circumference along with some eschar  removed with a #5 curette. The area was then vigorously washed with Anasept and gauze. Electronic Signature(s) Signed: 12/04/2019 5:29:05 PM By: Baltazar Najjarobson, Candee Hoon MD Signed: 01/02/2020 9:05:01 AM By: Zandra AbtsLynch, Shatara RN, BSN Previous Signature: 11/22/2019 5:41:03 PM Version By: Baltazar Najjarobson, Nashla Althoff MD Entered By: Zandra AbtsLynch, Shatara on 12/04/2019 16:38:41 -------------------------------------------------------------------------------- Physician Orders Details Patient Name: Date of Service: Gaspar BiddingHARDISON, Markian 11/22/2019 2:45 PM Medical Record [id number] Patient Account Number: [id number] Date of Birth/Sex: Treating RN: 11-22-1970 (48 y.o. [name] SoursM) Deaton, Bobbi Primary Care Provider: Salley SlaughterBOONE, ANGELA Other Clinician: Referring Provider: Treating Provider/Extender:Ritu Gagliardo, Ronita HippsMichael BOONE, Corliss BlackerANGELA Weeks in Treatment: 0 Verbal / Phone Orders: No Diagnosis Coding Follow-up Appointments Return Appointment in 1 week. - Thursday Dressing Change Frequency Do not change entire dressing for one week. Skin Barriers/Peri-Wound Care TCA Cream or Ointment - mix with lotion. Wound Cleansing May shower with protection. - use a cast protector. Primary Wound Dressing Wound #2 Right,Medial Calcaneus Hydrofera Blue - classic Wound #3 Right,Medial,Dorsal Foot Hydrofera Blue - classic Secondary Dressing Dry Gauze ABD pad Edema Control 3 Layer Compression System - Right Lower Extremity Avoid standing for long periods of time Elevate legs to the level of the heart or above for 30 minutes daily and/or when sitting, a frequency of: - throughout the day. Exercise regularly Services and Therapies Venous Studies -Bilateral - venous reflux studies bilateral lower legs related to non-healing ulcers to right leg. ICD 10 code Patient Medications Allergies: No Known Allergies Notifications Medication Indication Start End lidocaine DOSE topical 4 % gel - gel topical applied to wounds before debridement by MD at wound  center. Electronic Signature(s) Signed: 12/04/2019 5:29:05 PM By: Baltazar Najjarobson, Aidin Doane MD Signed: 01/02/2020 9:05:01 AM By: Zandra AbtsLynch, Shatara RN, BSN Previous Signature: 11/22/2019 5:41:03 PM Version By: Baltazar Najjarobson, Carlethia Mesquita MD Previous Signature: 11/22/2019 6:05:23 PM Version By: Shawn Stalleaton, Bobbi Entered By: Zandra AbtsLynch, Shatara on 12/04/2019 16:41:12 -------------------------------------------------------------------------------- Prescription 11/22/2019 Patient Name: Gaspar BiddingHARDISON, Tykeem Provider: Baltazar Najjarobson, Carolyn Sylvia MD Date of Birth: 11-22-1970 NPI#: [id number] Sex: M DEA#: [id number] Phone #: [phone] License #: [id number] [facility] [address] [place], [place] [address] [place], [place] [address] [phone] Allergies No Known Allergies Provider's Orders Venous Studies -Bilateral - venous reflux studies bilateral lower legs related to non-healing ulcers to right leg. ICD 10 code Signature(s): Date(s): Prescription 11/22/2019 Patient Name: Gaspar BiddingHARDISON, Zymier Provider: Baltazar Najjarobson, Henreitta Spittler MD Date of Birth: 11-22-1970 NPI#: [id number] Sex: Judie PetitM DEA#: [id number] Phone #: [phone] License #: [id number] [facility] [address] [place] [address] [place] [address] [phone] Allergies No Known Allergies Medication Medication: Route: Strength: Form: lidocaine 4 % topical gel topical 4% gel Class: TOPICAL LOCAL ANESTHETICS Dose: Frequency / Time: Indication: gel topical applied to wounds before debridement  by MD at wound center. Number of Refills: Number of Units: 0 Generic Substitution: Start Date: End Date: One Time Use: Substitution Permitted No Note to Pharmacy: Signature(s): Date(s): Electronic Signature(s) Signed: 12/04/2019 5:29:05 PM By: Baltazar Najjar MD Signed: 01/02/2020 9:05:01 AM By: Zandra Abts RN,  BSN Previous Signature: 11/22/2019 5:41:03 PM Version By: Baltazar Najjar MD Previous Signature: 11/22/2019 6:05:23 PM Version By: Shawn Stall Entered By: Zandra Abts on 12/04/2019 16:41:12 --------------------------------------------------------------------------------  Problem List Details Patient Name: Date of Service: DUSTAN, REISMAN 11/22/2019 2:45 PM Medical Record [id number] Patient Account Number: [id number] Date of Birth/Sex: Treating RN: 1971/08/11 (48 y.o. 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 limited to 11/22/2019 No Yes breakdown of skin L97.511 Non-pressure chronic ulcer of other part of right foot 11/22/2019 No Yes limited to breakdown of skin Inactive Problems Resolved Problems Electronic Signature(s) Signed: 11/22/2019 5:41:03 PM By: Baltazar Najjar MD Entered By: Baltazar Najjar on 11/22/2019 16:22:41 -------------------------------------------------------------------------------- Progress Note Details Patient Name: Date of Service: KEYWON, TKACHENKO 11/22/2019 2:45 PM Medical Record [id number] Patient Account Number: [id number] Date of Birth/Sex: Treating RN: 1971/01/25 (48 y.o. [name]: Salley Slaughter Other Clinician: Referring Provider: Treating Provider/Extender:Ernie Kasler, Ronita Hipps, Corliss Blacker in Treatment: 0 Subjective Chief Complaint Information obtained from Patient 11/22/2019; the patient is here for review of the wound on his right medial ankle and medial dorsal foot History of Present Illness  (HPI) ADMISSION 11/22/2019 This is a 49 year old man who has a history of chronic venous insufficiency and lymphedema. He has been dealing with wounds in his right medial malleolus for several months. He was actually seen by Dr. Lynden Ang at the wound care center. They were using silver alginate with his own juxta lite stockings. The patient states that the wounds fluctuated between improving and then worsening. He was seen by Dr. Myra Gianotti at [facility] in June 2020. He had arterial arterial studies that showed triphasic waveforms on the right with noncompressible ABIs at 1.39 however great toe pressure was normal at 0.84. There does not seem to provide a lot of evidence that there was an arterial component. He also had a DVT rule out study which was negative for DVT there was no evidence of superficial venous thrombosis. Dr. Darryl Lent did not think he had an arterial pathology. He put him in 20/30 compression stockings. I am a bit surprised he did not do reflux studies. The patient states the wounds are painful he uses Tylenol and aspirin. 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 Patient History Information obtained from Patient. Allergies No Known Allergies Family History Cancer - Paternal Grandparents, Heart Disease - Mother, No family history of Diabetes, Hereditary Spherocytosis, Hypertension, Kidney Disease, Lung Disease, Seizures, Stroke, Thyroid Problems, Tuberculosis. Social History Alcohol Use - Moderate, Drug Use - Prior History - cocaine, crack, Caffeine Use - Daily. Medical History Eyes Denies history of Cataracts, Glaucoma, Optic Neuritis Ear/Nose/Mouth/Throat Denies history of Chronic sinus problems/congestion, Middle  ear problems Hematologic/Lymphatic Denies history of Anemia, Hemophilia, Human  Immunodeficiency Virus, Lymphedema, Sickle Cell Disease Respiratory Denies history of Aspiration, Asthma, Chronic Obstructive Pulmonary Disease (COPD), Pneumothorax, Sleep Apnea, Tuberculosis Cardiovascular Denies history of Angina, Arrhythmia, Congestive Heart Failure, Coronary Artery Disease, Deep Vein Thrombosis, Hypertension, Hypotension, Myocardial Infarction, Peripheral Arterial Disease, Peripheral Venous Disease, Phlebitis, Vasculitis Gastrointestinal Denies history of Cirrhosis , Colitis, Crohnoos, Hepatitis A, Hepatitis B, Hepatitis C Endocrine Denies history of Type I Diabetes, Type II Diabetes Genitourinary Denies history of End Stage Renal Disease Immunological Denies history of Lupus Erythematosus, Raynaudoos, Scleroderma Integumentary (Skin) Denies history of History of Burn Musculoskeletal Denies history of Gout, Rheumatoid Arthritis, Osteoarthritis, Osteomyelitis Neurologic Patient has history of Paraplegia - left side weakness Denies history of Dementia, Neuropathy, Quadriplegia, Seizure Disorder Oncologic Denies history of Received Chemotherapy, Received Radiation Psychiatric Denies history of Anorexia/bulimia, Confinement Anxiety Review of Systems (ROS) Constitutional Symptoms (General Health) Denies complaints or symptoms of Fatigue, Fever, Chills, Marked Weight Change. Eyes Denies complaints or symptoms of Dry Eyes, Vision Changes, Glasses / Contacts. Ear/Nose/Mouth/Throat Denies complaints or symptoms of Chronic sinus problems or rhinitis. Respiratory Denies complaints or symptoms of Chronic or frequent coughs, Shortness of Breath. Cardiovascular Denies complaints or symptoms of Chest pain. Gastrointestinal Denies complaints or symptoms of Frequent diarrhea, Nausea, Vomiting. Endocrine Denies complaints or symptoms of Heat/cold intolerance. Genitourinary Denies  complaints or symptoms of Frequent urination. Integumentary (Skin) Complains or has symptoms of Wounds. Musculoskeletal Denies complaints or symptoms of Muscle Pain, Muscle Weakness. Neurologic Denies complaints or symptoms of Numbness/parasthesias. Psychiatric Denies complaints or symptoms of Claustrophobia, Suicidal. Objective Constitutional Patient is hypertensive.. Pulse regular and within target range for patient.Marland Kitchen Respirations regular, non-labored and within target range.. Temperature is normal and within the target range for the patient.Marland Kitchen Appears in no distress. Vitals Time Taken: 2:54 PM, Height: 72 in, Source: Stated, Weight: 150 lbs, Source: Stated, BMI: 20.3, Temperature: 98.4 F, Pulse: 81 bpm, Respiratory Rate: 18 breaths/min, Blood Pressure: 154/88 mmHg. Respiratory work of breathing is normal. Bilateral breath sounds are clear and equal in all lobes with no wheezes, rales or rhonchi.. Cardiovascular Heart rhythm and rate regular, without murmur or gallop.. Popliteal pulses palpable on the right. Pedal pulses are easily palpable on the right. the patient has mild edema on the right leg severe hemosiderin deposition involving the foot ankle and calf especially medially.Marland Kitchen Psychiatric appears at normal baseline. General Notes: Wound exam; the patient has 3 wounds to larger areas on the right medial malleolus the right medial foot and calcaneus and what was apparently a sock or stocking injury on the right dorsal foot just dorsal to the ankle crease. The areas are very superficial. Flaking skin on the circumference along with some eschar removed with a #5 curette. The area was then vigorously washed with Anasept and gauze. Integumentary (Hair, Skin) Very marked hemosiderin deposition on the right medial foot ankle and leg. Wound #1 status is Open. Original cause of wound was Gradually Appeared. The wound is located on the Right Malleolus. The wound measures 3cm length x 4cm  width x 0.1cm depth; 9.425cm^2 area and 0.942cm^3 volume. There is Fat Layer (Subcutaneous Tissue) Exposed exposed. There is no tunneling or undermining noted. There is a medium amount of serosanguineous drainage noted. There is no granulation within the wound bed. There is a large (67-100%) amount of necrotic tissue within the wound bed including Adherent Slough. Wound #2 status is Open. Original cause of wound was Gradually Appeared. The wound is located on the Right,Medial Calcaneus. The wound measures 2.5cm length x 3.1cm width x  0.1cm depth; 6.087cm^2 area and 0.609cm^3 volume. There is no tunneling or undermining noted. There is a medium amount of serosanguineous drainage noted. There is no granulation within the wound bed. There is a large (67-100%) amount of necrotic tissue within the wound bed including Adherent Slough. Wound #3 status is Open. Original cause of wound was Gradually Appeared. The wound is located on the Right,Medial,Dorsal Foot. The wound measures 0.7cm length x 4.5cm width x 0.1cm depth; 2.474cm^2 area and 0.247cm^3 volume. There is Fat Layer (Subcutaneous Tissue) Exposed exposed. There is no tunneling or undermining noted. There is a medium amount of serosanguineous drainage noted. There is small (1-33%) pink granulation within the wound bed. There is a large (67-100%) amount of necrotic tissue within the wound bed including Adherent Slough. 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 There was a Selective/Open Wound Skin/Dermis Debridement with a total  area of 12 sq cm performed by Maxwell Caulobson, Charlies Rayburn G., MD. With the following instrument(s): Curette to remove Viable and Non-Viable tissue/material. Material removed includes Skin: Dermis. A time out was conducted at 16:00, prior to the start of the procedure. A Minimum amount of bleeding was controlled with Pressure. The procedure was tolerated well with a pain level of 0 throughout and a pain level of 2 following the procedure. Post Debridement Measurements: 3cm length x 4cm width x 0.1cm depth; 0.942cm^3 volume. Character of Wound/Ulcer Post Debridement requires further debridement. Severity of Tissue Post Debridement is: Limited to breakdown of skin. 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 There was a Three Layer Compression Therapy Procedure by Zandra AbtsLynch, Shatara, RN. Post procedure Diagnosis Wound #1: Same as Pre-Procedure Wound #2 Pre-procedure diagnosis of Wound #2 is a Trauma, Other located on the Right,Distal,Medial Foot .Severity of Tissue Pre Debridement is: Limited to breakdown of skin. There was a Selective/Open Wound Skin/Dermis Debridement with a total area of 7.75 sq cm performed by Maxwell Caulobson, Xavion Muscat G., MD. With the following instrument(s): Curette to remove Viable and Non-Viable tissue/material. Material removed includes Skin: Dermis after achieving pain control using Lidocaine 5% topical ointment. A time out was conducted at 16:00, prior to the start of the procedure. A Minimum amount of bleeding was controlled with Pressure. The procedure was tolerated well with a pain level of 0 throughout and a pain level of 2 following the procedure. Post Debridement Measurements: 2.5cm length x 3.1cm width x 0.1cm depth; 0.609cm^3 volume. Character of Wound/Ulcer Post Debridement requires further debridement. Severity of Tissue Post Debridement is: Limited to breakdown of skin. Post  procedure Diagnosis Wound #2: Same as Pre-Procedure Pre-procedure diagnosis of Wound #2 is a Trauma, Other located on the Right,Distal,Medial Foot . There was a Three Layer Compression Therapy Procedure by Zandra AbtsLynch, Shatara, RN. Post procedure Diagnosis Wound #2: Same as Pre-Procedure Wound #3 Pre-procedure diagnosis of Wound #3 is a Venous Leg Ulcer located on the Right,Medial,Dorsal Foot .Severity of Tissue Pre Debridement is: Limited to breakdown of skin. There was a Selective/Open Wound Skin/Dermis Debridement with a total area of 2.25 sq cm performed by Leanord Hawkingobson,  Venetia Night., MD. With the following instrument(s): Curette to remove Viable and Non-Viable tissue/material. Material removed includes Skin: Dermis after achieving pain control using Lidocaine 5% topical ointment. A time out was conducted at 16:00, prior to the start of the procedure. A Minimum amount of bleeding was controlled with Pressure. The procedure was tolerated well with a pain level of 0 throughout and a pain level of 2 following the procedure. Post Debridement Measurements: 0.7cm length x 4.5cm width x 0.1cm depth; 0.247cm^3 volume. Character of Wound/Ulcer Post Debridement requires further debridement. Severity of Tissue Post Debridement is: Limited to breakdown of skin. Post procedure Diagnosis Wound #3: Same as Pre-Procedure Pre-procedure diagnosis of Wound #3 is a Venous Leg Ulcer located on the Right,Medial,Dorsal Foot . There was a Three Layer Compression Therapy Procedure by Zandra Abts, RN. Post procedure Diagnosis Wound #3: Same as Pre-Procedure Plan Follow-up Appointments: Return Appointment in 1 week. - Thursday Dressing Change Frequency: Do not change entire dressing for one week. Skin Barriers/Peri-Wound Care: TCA Cream or Ointment - mix with lotion. Wound Cleansing: May shower with protection. - use a cast protector. Primary Wound Dressing: Wound #2 Right,Medial Calcaneus: Hydrofera Blue -  classic Wound #3 Right,Medial,Dorsal Foot: Hydrofera Blue - classic Secondary Dressing: Dry Gauze ABD pad Edema Control: 3 Layer Compression System - Right Lower Extremity Avoid standing for long periods of time Elevate legs to the level of the heart or above for 30 minutes daily and/or when sitting, a frequency of: - throughout the day. Exercise regularly Services and Therapies ordered were: Venous Studies -Bilateral - venous reflux studies bilateral lower legs related to non-healing ulcers to right leg. ICD 10 code The following medication(s) was prescribed: lidocaine topical 4 % gel gel topical applied to wounds before debridement by MD at wound center. was prescribed at facility 1. I believe these are probably venous ulcerations 2. The wounds are dry flaky and the edges were debrided as described. 3. I change the primary dressing to [facility] Blue classic and I am going to keep this under 3 layer compression. I do not believe there is an arterial issue. 4. Although the patient has juxta lite stockings I did not feel this would add enough compression. Her is father was coming every day to change the dressing and reapply the juxta light. 5. I have asked him to keep his legs elevated. He was sleeping in a recliner I have asked him not to do that for fear that his legs would inadvertently become dependent during the night. 6. I have ordered reflux studies on him. I am not exactly clear why Dr. Myra Gianotti did not I spent 45 minutes in review of this patient's past records, face-to-face evaluation, discussion with the patient and his father, physical examination and preparation of this record Electronic Signature(s) Signed: 12/04/2019 5:29:05 PM By: Baltazar Najjar MD Signed: 01/02/2020 9:05:01 AM By: Zandra Abts RN, BSN Previous Signature: 11/22/2019 5:41:03 PM Version By: Baltazar Najjar MD Entered By: Zandra Abts on 12/04/2019  16:41:20 -------------------------------------------------------------------------------- HxROS Details Patient Name: Date of Service: KIMMY, PARISH 11/22/2019 2:45 PM Medical Record [id number] Patient Account Number: [id number] Date of Birth/Sex: Treating RN: 07/01/1971 (48 y.o. Melonie [place] Primary Care Provider: Salley Slaughter Other Clinician: Referring Provider: Treating Provider/Extender:Tashawnda Bleiler, Ronita Hipps, ANGELA Weeks in Treatment: 0 Information Obtained From Patient Constitutional Symptoms (General Health) Complaints and Symptoms: Negative for: Fatigue; Fever; Chills; Marked Weight Change Eyes Complaints and Symptoms: Negative for: Dry Eyes; Vision Changes; Glasses / Contacts Medical History: Negative for: Cataracts; Glaucoma;  Optic Neuritis Ear/Nose/Mouth/Throat Complaints and Symptoms: Negative for: Chronic sinus problems or rhinitis Medical History: Negative for: Chronic sinus problems/congestion; Middle ear problems Respiratory Complaints and Symptoms: Negative for: Chronic or frequent coughs; Shortness of Breath Medical History: Negative for: Aspiration; Asthma; Chronic Obstructive Pulmonary Disease (COPD); Pneumothorax; Sleep Apnea; Tuberculosis Cardiovascular Complaints and Symptoms: Negative for: Chest pain Medical History: Negative for: Angina; Arrhythmia; Congestive Heart Failure; Coronary Artery Disease; Deep Vein Thrombosis; Hypertension; Hypotension; Myocardial Infarction; Peripheral Arterial Disease; Peripheral Venous Disease; Phlebitis; Vasculitis Gastrointestinal Complaints and Symptoms: Negative for: Frequent diarrhea; Nausea; Vomiting Medical History: Negative for: Cirrhosis ; Colitis; Crohns; Hepatitis A; Hepatitis B; Hepatitis C Endocrine Complaints and Symptoms: Negative for: Heat/cold intolerance Medical History: Negative for: Type I Diabetes; Type II Diabetes Genitourinary Complaints and Symptoms: Negative for: Frequent  urination Medical History: Negative for: End Stage Renal Disease Integumentary (Skin) Complaints and Symptoms: Positive for: Wounds Medical History: Negative for: History of Burn Musculoskeletal Complaints and Symptoms: Negative for: Muscle Pain; Muscle Weakness Medical History: Negative for: Gout; Rheumatoid Arthritis; Osteoarthritis; Osteomyelitis Neurologic Complaints and Symptoms: Negative for: Numbness/parasthesias Medical History: Positive for: Paraplegia - left side weakness Negative for: Dementia; Neuropathy; Quadriplegia; Seizure Disorder Psychiatric Complaints and Symptoms: Negative for: Claustrophobia; Suicidal Medical History: Negative for: Anorexia/bulimia; Confinement Anxiety Hematologic/Lymphatic Medical History: Negative for: Anemia; Hemophilia; Human Immunodeficiency Virus; Lymphedema; Sickle Cell Disease Immunological Medical History: Negative for: Lupus Erythematosus; Raynauds; Scleroderma Oncologic Medical History: Negative for: Received Chemotherapy; Received Radiation Immunizations Pneumococcal Vaccine: Received Pneumococcal Vaccination: No Implantable Devices None Family and Social History Cancer: Yes - Paternal Grandparents; Diabetes: No; Heart Disease: Yes - Mother; Hereditary Spherocytosis: No; Hypertension: No; Kidney Disease: No; Lung Disease: No; Seizures: No; Stroke: No; Thyroid Problems: No; Tuberculosis: No; Alcohol Use: Moderate; Drug Use: Prior History - cocaine, crack; Caffeine Use: Daily; Financial Concerns: No; Food, Clothing or Shelter Needs: No; Support System Lacking: No; Transportation Concerns: No Electronic Signature(s) Signed: 11/22/2019 5:41:03 PM By: Baltazar Najjar MD Signed: 11/22/2019 5:55:28 PM By: Yevonne Pax RN Entered By: Yevonne Pax on 11/22/2019 15:18:50 -------------------------------------------------------------------------------- SuperBill Details Patient Name: Date of Service: DEFORREST, BOGLE  11/22/2019 Medical Record Number:[id number] Patient Account Number: [id number] Date of Birth/Sex: Treating RN: November 12, 1970 (48 y.o. 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 L97.511 Non-pressure chronic ulcer of other part of right foot limited to breakdown of skin Facility Procedures The patient participates with Medicare or their insurance follows the Medicare Facility Guidelines: CPT4 Code Description Modifier Quantity [id number] [phone] - WOUND CARE VISIT-LEV 3 EST PT 1 The patient participates with Medicare or their insurance follows the Medicare Facility Guidelines: [id number] - DEBRIDE WOUND 1ST 20 SQ CM OR < 1 ICD-10 Diagnosis Description L97.311 Non-pressure chronic ulcer of right ankle limited to breakdown of  skin The patient participates with Medicare or their insurance follows the Medicare Facility Guidelines: [id number] 97598 - DEBRIDE WOUND EA ADDL 20 SQ CM 1 ICD-10 Diagnosis Description L97.511 Non-pressure chronic ulcer of other part of right foot limited to  breakdown of skin Physician Procedures CPT4: Description Modifier Quantity Code 1017510 99204 - WC PHYS LEVEL 4 - NEW PT 25 1 ICD-10 Diagnosis Description I87.331 Chronic venous hypertension (idiopathic) with ulcer and inflammation of right lower extremity L97.311 Non-pressure chronic ulcer  of right ankle  limited to breakdown of skin L97.511 Non-pressure chronic ulcer of other part of right foot limited to breakdown of skin CPT4: 7847841 97597 - WC PHYS DEBR WO ANESTH 20 SQ CM 1 ICD-10 Diagnosis Description L97.311 Non-pressure chronic ulcer of right ankle limited to breakdown  of skin CPT4: 2820813 97598 - WC PHYS DEBR WO ANESTH EA ADD 20 CM 1 ICD-10 Diagnosis Description L97.511 Non-pressure chronic ulcer of other part of right foot limited to breakdown of skin Electronic Signature(s) Signed: 11/22/2019 5:41:03 PM By: Linton Ham MD Entered By: Linton Ham on 11/22/2019 16:40:26

## 2019-11-22 NOTE — Progress Notes (Signed)
ESHAWN, COOR ([id number]) Visit Report for 11/22/2019 Abuse/Suicide Risk Screen Details Patient Name: Date of Service: Daniel Arroyo, Daniel Arroyo 11/22/2019 2:45 PM Medical Record Number:[id number] Patient Account Number: [id number] Date of Birth/Sex: Treating RN: [birth year] (49 y.o. Daniel Arroyo) Carlene Coria Primary Care Nerida Boivin: Arsenio Katz Other Clinician: Referring Nadine Ryle: Treating Mattisyn Cardona/Extender:Robson, Otila Back, ANGELA Weeks in Treatment: 0 Abuse/Suicide Risk Screen Items Answer ABUSE RISK SCREEN: Has anyone close to you tried to hurt or harm you recentlyo No Do you feel uncomfortable with anyone in your familyo No Has anyone forced you do things that you didnt want to doo No Electronic Signature(s) Signed: 11/22/2019 5:55:28 PM By: Carlene Coria RN Entered By: Carlene Coria on 11/22/2019 15:19:42 -------------------------------------------------------------------------------- Activities of Daily Living Details Patient Name: Date of Service: RANSOME, HELWIG 11/22/2019 2:45 PM Medical Record Number:[id number] Patient Account Number: [id number] Date of Birth/Sex: Treating RN: 1971/03/14 (49 y.o. Daniel Arroyo) Carlene Coria Primary Care Simon Aaberg: Arsenio Katz Other Clinician: Referring Enedelia Martorelli: Treating Mariesa Grieder/Extender:Robson, Otila Back, ANGELA Weeks in Treatment: 0 Activities of Daily Living Items Answer Activities of Daily Living (Please select one for each item) Drive Automobile Not Able Take Medications Completely Able Use Telephone Completely Able Care for Appearance Need Assistance Use Toilet Need Assistance Bath / Shower Need Assistance Dress Self Need Assistance Feed Self Completely Able Walk Completely Able Get In / Out Bed Need Assistance Housework Need Assistance Prepare Meals Need Assistance Handle Money Need Assistance Shop for Self Need Assistance Electronic Signature(s) Signed: 11/22/2019 5:55:28 PM By: Carlene Coria RN Entered By: Carlene Coria on 11/22/2019  15:20:45 -------------------------------------------------------------------------------- Education Screening Details Patient Name: Date of Service: ERICKSON, Daniel Arroyo 11/22/2019 2:45 PM Medical Record [id number] Patient Account Number: [id number] Date of Birth/Sex: Treating RN: Jun 19, 1971 (48 y.o. Oval Linsey Primary Care Kiora Hallberg: Arsenio Katz Other Clinician: Referring Jaiona Simien: Treating Alanta Scobey/Extender:Robson, Otila Back, Valinda Party in Treatment: 0 Primary Learner Assessed: Patient Learning Preferences/Education Level/Primary Language Learning Preference: Explanation Highest Education Level: College or Above Preferred Language: English Cognitive Barrier Language Barrier: No Translator Needed: No Memory Deficit: No Emotional Barrier: No Cultural/Religious Beliefs Affecting Medical Care: No Physical Barrier Impaired Vision: No Impaired Hearing: No Decreased Hand dexterity: No Knowledge/Comprehension Knowledge Level: Medium Comprehension Level: High Ability to understand written High instructions: Ability to understand verbal High instructions: Motivation Anxiety Level: Calm Cooperation: Cooperative Education Importance: Acknowledges Need Interest in Health Problems: Asks Questions Perception: Coherent Willingness to Engage in Self- High Management Activities: Readiness to Engage in Self- High Management Activities: Electronic Signature(s) Signed: 11/22/2019 5:55:28 PM By: Carlene Coria RN Entered By: Carlene Coria on 11/22/2019 15:21:20 -------------------------------------------------------------------------------- Fall Risk Assessment Details Patient Name: Date of Service: YUEPHENG, Daniel Arroyo 11/22/2019 2:45 PM Medical Record [id number] Patient Account Number: [id number] Date of Birth/Sex: Treating RN: Jan 05, 1971 (49 y.o. Daniel Arroyo) Carlene Coria Primary Care Alexyia Guarino: Arsenio Katz Other Clinician: Referring Joscelyn Hardrick: Treating  Kyann Heydt/Extender:Robson, Otila Back, ANGELA Weeks in Treatment: 0 Fall Risk Assessment Items Have you had 2 or more falls in the last 12 monthso 0 Yes Have you had any fall that resulted in injury in the last 12 monthso 0 Yes FALLS RISK SCREEN History of falling - immediate or within 3 months 0 No Secondary diagnosis (Do you have 2 or more medical diagnoseso) 0 No Ambulatory aid None/bed rest/wheelchair/nurse 0 No Crutches/cane/walker 0 No Furniture 0 No Intravenous therapy Access/Saline/Heparin Lock 0 No Weak (short steps with or without shuffle, stooped but able to lift head 0 No while walking, may seek support from furniture) Impaired (short steps with shuffle, may have difficulty arising from chair, 0  No head down, impaired balance) Mental Status Oriented to own ability 0 No Overestimates or forgets limitations 0 No Risk Level: Low Risk Score: 0 Electronic Signature(s) Signed: 11/22/2019 5:55:28 PM By: Yevonne Pax RN Entered By: Yevonne Pax on 11/22/2019 15:21:52 -------------------------------------------------------------------------------- Foot Assessment Details Patient Name: Date of Service: SLAYDEN, Daniel Arroyo 11/22/2019 2:45 PM Medical Record [id number] Patient Account Number: [id number] Date of Birth/Sex: Treating RN: January 04, 1971 (48 y.o. Judie Petit) Yevonne Pax Primary Care Hollie Wojahn: Salley Slaughter Other Clinician: Referring Indiah Heyden: Treating Margarine Grosshans/Extender:Robson, Ronita Hipps, ANGELA Weeks in Treatment: 0 Foot Assessment Items Site Locations + = Sensation present, - = Sensation absent, C = Callus, U = Ulcer R = Redness, W = Warmth, M = Maceration, PU = Pre-ulcerative lesion F = Fissure, S = Swelling, D = Dryness Assessment Right: Left: Other Deformity: No No Prior Foot Ulcer: No No Prior Amputation: No No Charcot Joint: No No Ambulatory Status: Ambulatory Without Help Gait: Steady Electronic Signature(s) Signed: 11/22/2019 5:55:28 PM By: Yevonne Pax  RN Entered By: Yevonne Pax on 11/22/2019 15:23:41 -------------------------------------------------------------------------------- Nutrition Risk Screening Details Patient Name: Date of Service: ELIGHA, KMETZ 11/22/2019 2:45 PM Medical Record Number:[id number] Patient Account Number: [id number] Date of Birth/Sex: Treating RN: June 24, 1971 (48 y.o. Judie Petit) Yevonne Pax Primary Care Oron Westrup: Salley Slaughter Other Clinician: Referring Oren Barella: Treating Emerick Weatherly/Extender:Robson, Ronita Hipps, ANGELA Weeks in Treatment: 0 Height (in): 72 Weight (lbs): 150 Body Mass Index (BMI): 20.3 Nutrition Risk Screening Items Score Screening NUTRITION RISK SCREEN: I have an illness or condition that made me change the kind and/or 0 No amount of food I eat I eat fewer than two meals per day 0 No I eat few fruits and vegetables, or milk products 0 No I have three or more drinks of beer, liquor or wine almost every day 0 No I have tooth or mouth problems that make it hard for me to eat 0 No I don't always have enough money to buy the food I need 0 No I eat alone most of the time 0 No I take three or more different prescribed or over-the-counter drugs a day 1 Yes 2 Yes Without wanting to, I have lost or gained 10 pounds in the last six months I am not always physically able to shop, cook and/or feed myself 2 Yes Nutrition Protocols Good Risk Protocol Provide education on Moderate Risk Protocol 0 nutrition High Risk Proctocol Risk Level: Moderate Risk Score: 5 Electronic Signature(s) Signed: 11/22/2019 5:55:28 PM By: Yevonne Pax RN Entered By: Yevonne Pax on 11/22/2019 15:22:28

## 2019-11-22 NOTE — Progress Notes (Addendum)
KENLEE, MALER ([id number]) Visit Report for 11/22/2019 Allergy List Details Patient Name: Date of Service: RHIAN, FUNARI 11/22/2019 2:45 PM Medical Record Number:[id number] Patient Account Number: [id number] Date of Birth/Sex: Treating RN: [birth year] (49 y.o. Jerilynn Mages) Carlene Coria Primary Care Vitalia Stough: Arsenio Katz Other Clinician: Referring Raeanne Deschler: Treating Tabithia Stroder/Extender:Robson, Otila Back, ANGELA Weeks in Treatment: 0 Allergies Active Allergies No Known Allergies Allergy Notes Electronic Signature(s) Signed: 11/22/2019 5:55:28 PM By: Carlene Coria RN Entered By: Carlene Coria on 11/22/2019 15:12:39 -------------------------------------------------------------------------------- Arrival Information Details Patient Name: Date of Service: SIDDIQ, KALUZNY 11/22/2019 2:45 PM Medical Record [id number] Patient Account Number: [id number] Date of Birth/Sex: Treating RN: Jan 28, 1971 (49 y.o. [name] Primary Care Sharon Rubis: Arsenio Katz Other Clinician: Referring Allison Deshotels: Treating Callaway Hardigree/Extender:Robson, Otila Back, Valinda Party in Treatment: 0 Visit Information Patient Arrived: Cane Arrival Time: 14:51 Accompanied By: father Transfer Assistance: None Patient Identification Verified: Yes Secondary Verification Process Yes Completed: Patient Requires Transmission-Based No Precautions: Patient Has Alerts: Yes Patient Alerts: 02/2019 ABI: [place] 02/2019 TBI:L0.85, R0.84 Electronic Signature(s) Signed: 11/22/2019 6:05:23 PM By: Deon Pilling Entered By: Deon Pilling on 11/22/2019 16:10:41 -------------------------------------------------------------------------------- Clinic Level of Care Assessment Details Patient Name: Date of Service: RUTGER, SALTON 11/22/2019 2:45 PM Medical Record Number:[id number] Patient Account Number: [id number] Date of Birth/Sex: Treating RN: [birth year] (49 y.o. [name] Primary Care Mone Commisso: Arsenio Katz Other  Clinician: Referring Debra Calabretta: Treating Lorell Thibodaux/Extender:Robson, Otila Back, ANGELA Weeks in Treatment: 0 Clinic Level of Care Assessment Items TOOL 1 Quantity Score X - Use when EandM and Procedure is performed on INITIAL visit 1 0 ASSESSMENTS - Nursing Assessment / Reassessment X - General Physical Exam (combine w/ comprehensive assessment (listed just below) 1 20 when performed on new pt. evals) X - Comprehensive Assessment (HX, ROS, Risk Assessments, Wounds Hx, etc.) 1 25 ASSESSMENTS - Wound and Skin Assessment / Reassessment X - Dermatologic / Skin Assessment (not related to wound area) 1 10 ASSESSMENTS - Ostomy and/or Continence Assessment and Care [] - Incontinence Assessment and Management 0 [] - Ostomy Care Assessment and Management (repouching, etc.) 0 PROCESS - Coordination of Care [] - Simple Patient / Family Education for ongoing care 0 X - Complex (extensive) Patient / Family Education for ongoing care 1 20 X - Staff obtains Programmer, systems, Records, Test Results / Process Orders 1 10 X - Staff telephones HHA, Nursing Homes / Clarify orders / etc 1 10 [] - Routine Transfer to another Facility (non-emergent condition) 0 [] - Routine Hospital Admission (non-emergent condition) 0 X - New Admissions / Biomedical engineer / Ordering NPWT, Apligraf, etc. 1 15 [] - Emergency Hospital Admission (emergent condition) 0 PROCESS - Special Needs [] - Pediatric / Minor Patient Management 0 [] - Isolation Patient Management 0 [] - Hearing / Language / Visual special needs 0 [] - Assessment of Community assistance (transportation, D/C planning, etc.) 0 [] - Additional assistance / Altered mentation 0 [] - Support Surface(s) Assessment (bed, cushion, seat, etc.) 0 INTERVENTIONS - Miscellaneous [] - External ear exam 0 [] - Patient Transfer (multiple staff / Civil Service fast streamer / Similar devices) 0 [] - Simple Staple / Suture removal (25 or less) 0 [] - Complex Staple / Suture removal (26  or more) 0 [] - Hypo/Hyperglycemic Management (do not check if billed separately) 0 X - Ankle / Brachial Index (ABI) - do not check if billed separately 1 15 Has the patient been seen at the hospital within the last three years: Yes Total Score: 125 Level Of Care: New/Established - Level 4 Electronic  Signature(s) Signed: 11/22/2019 6:05:23 PM By: Deaton, Bobbi Entered By: Deaton, Bobbi on 11/22/2019 16:38:37 -------------------------------------------------------------------------------- Compression Therapy Details Patient Name: Date of Service: Atienza, Nickalus 11/22/2019 2:45 PM Medical Record Number:[id number] Patient Account Number: [id number] Date of Birth/Sex: Treating RN: 10/16/1970 (48 y.o. M) Deaton, Bobbi Primary Care : BOONE, ANGELA Other Clinician: Referring : Treating /Extender:Robson, Michael BOONE, ANGELA Weeks in Treatment: 0 Compression Therapy Performed for Wound Wound #1 Right Malleolus Assessment: Performed By: Clinician Lynch, Shatara, RN Compression Type: Three Layer Post Procedure Diagnosis Same as Pre-procedure Electronic Signature(s) Signed: 11/22/2019 6:05:23 PM By: Deaton, Bobbi Entered By: Deaton, Bobbi on 11/22/2019 16:09:37 -------------------------------------------------------------------------------- Compression Therapy Details Patient Name: Date of Service: Etheredge, Krishna 11/22/2019 2:45 PM Medical Record Number:[id number] Patient Account Number: [id number] Date of Birth/Sex: Treating RN: 07/23/1971 (48 y.o. M) Deaton, Bobbi Primary Care : BOONE, ANGELA Other Clinician: Referring : Treating /Extender:Robson, Michael BOONE, ANGELA Weeks in Treatment: 0 Compression Therapy Performed for Wound Wound #3 Right,Medial,Dorsal Foot Assessment: Performed By: Clinician Lynch, Shatara, RN Compression Type: Three Layer Post Procedure Diagnosis Same as Pre-procedure Electronic Signature(s) Signed: 11/22/2019  6:05:23 PM By: Deaton, Bobbi Entered By: Deaton, Bobbi on 11/22/2019 16:09:37 -------------------------------------------------------------------------------- Compression Therapy Details Patient Name: Date of Service: Osborne, Harding 11/22/2019 2:45 PM Medical Record Number:[id number] Patient Account Number: [id number] Date of Birth/Sex: Treating RN: 04/03/1971 (48 y.o. M) Deaton, Bobbi Primary Care : BOONE, ANGELA Other Clinician: Referring : Treating /Extender:Robson, Michael BOONE, ANGELA Weeks in Treatment: 0 Compression Therapy Performed for Wound Wound #2 Right,Distal,Medial Foot Assessment: Performed By: Clinician Lynch, Shatara, RN Compression Type: Three Layer Post Procedure Diagnosis Same as Pre-procedure Electronic Signature(s) Signed: 11/22/2019 6:05:23 PM By: Deaton, Bobbi Entered By: Deaton, Bobbi on 11/22/2019 16:09:37 -------------------------------------------------------------------------------- Encounter Discharge Information Details Patient Name: Date of Service: Navis, Dionne 11/22/2019 2:45 PM Medical Record Number:[id number] Patient Account Number: [id number] Date of Birth/Sex: Treating RN: 12/05/1970 (48 y.o. M) Lynch, Shatara Primary Care : BOONE, ANGELA Other Clinician: Referring : Treating /Extender:Robson, Michael BOONE, ANGELA Weeks in Treatment: 0 Encounter Discharge Information Items Post Procedure Vitals Discharge Condition: Stable Temperature (F): 98.1 Ambulatory Status: Cane Pulse (bpm): 81 Discharge Destination: Home Respiratory Rate (breaths/min): 18 Transportation: Private Auto Blood Pressure (mmHg): 154/88 Accompanied By: father Schedule Follow-up Appointment: Yes Clinical Summary of Care: Patient Declined Electronic Signature(s) Signed: 11/22/2019 5:39:26 PM By: Lynch, Shatara RN, BSN Entered By: Lynch, Shatara on 11/22/2019  17:21:42 -------------------------------------------------------------------------------- Lower Extremity Assessment Details Patient Name: Date of Service: Hussar, Brix 11/22/2019 2:45 PM Medical Record Number:[id number] Patient Account Number: [id number] Date of Birth/Sex: Treating RN: 10/14/1970 (48 y.o. M) Epps, Carrie Primary Care : BOONE, ANGELA Other Clinician: Referring : Treating /Extender:Robson, Michael BOONE, ANGELA Weeks in Treatment: 0 Edema Assessment Assessed: [Left: No] [Right: No] E[Left: dema] [Right: :] Calf Left: Right: Point of Measurement: 45 cm From Medial Instep cm 34 cm Ankle Left: Right: Point of Measurement: 10 cm From Medial Instep cm 24 cm Electronic Signature(s) Signed: 11/22/2019 5:55:28 PM By: Epps, Carrie RN Entered By: Epps, Carrie on 11/22/2019 15:27:33 -------------------------------------------------------------------------------- Multi Wound Chart Details Patient Name: Date of Service: Maina, Abdikadir 11/22/2019 2:45 PM Medical Record Number:[id number] Patient Account Number: [id number] Date of Birth/Sex: Treating RN: 03/03/1971 (48 y.o. M) Deaton, Bobbi Primary Care : BOONE, ANGELA Other Clinician: Referring : Treating /Extender:Robson, Michael BOONE, ANGELA Weeks in Treatment: 0 Vital Signs Height(in): 72 Pulse(bpm): 81 Weight(lbs): 150 Blood Pressure(mmHg): 154/88 Body Mass Index(BMI): 20 Temperature(°F): 98.4 Respiratory 18 Rate(breaths/min): Photos: Wound Location: Right Malleolus Right Calcaneus - Medial Right Foot - Dorsal, Medial   Wounding Event: Gradually Appeared Gradually Appeared Gradually Appeared Primary Etiology: Venous Leg Ulcer Trauma, Other Venous Leg Ulcer Comorbid History: Paraplegia Paraplegia Paraplegia Date Acquired: 06/21/2019 06/21/2019 06/21/2019 Weeks of Treatment: 0 0 0 Wound Status: Open Open Open Measurements L x W x D 3x4x0.1 2.5x3.1x0.1  0.7x4.5x0.1 (cm) Area (cm) : 9.425 6.087 2.474 Volume (cm) : 0.942 0.609 0.247 % Reduction in Area: 0.00% 0.00% 0.00% % Reduction in Volume: 0.00% 0.00% 0.00% Classification: Full Thickness Without Full Thickness Without Full Thickness Without Exposed Support Structures Exposed Support Structures Exposed Support Structures Exudate Amount: Medium Medium Medium Exudate Type: Serosanguineous Serosanguineous Serosanguineous Exudate Color: red, brown red, brown red, brown Granulation Amount: None Present (0%) None Present (0%) Small (1-33%) Granulation Quality: N/A N/A Pink Necrotic Amount: Large (67-100%) Large (67-100%) Large (67-100%) Exposed Structures: Fat Layer (Subcutaneous Fascia: No Fat Layer (Subcutaneous Tissue) Exposed: Yes Fat Layer (Subcutaneous Tissue) Exposed: Yes Fascia: No Tissue) Exposed: No Fascia: No Tendon: No Tendon: No Tendon: No Muscle: No Muscle: No Muscle: No Joint: No Joint: No Joint: No Bone: No Bone: No Bone: No Epithelialization: N/A None None Debridement: Debridement - Debridement - Debridement - Selective/Open Wound Selective/Open Wound Selective/Open Wound Pre-procedure 16:00 16:00 16:00 Verification/Time Out Taken: Pain Control: N/A Lidocaine 5% topical Lidocaine 5% topical ointment ointment Level: Skin/Dermis Skin/Dermis Skin/Dermis Debridement Area (sq cm):12 7.75 2.25 Instrument: Curette Curette Curette Bleeding: Minimum Minimum Minimum Hemostasis Achieved: Pressure Pressure Pressure Procedural Pain: 0 0 0 Post Procedural Pain: _0 Debridement Treatment Procedure was tolerated Procedure was tolerated Procedure was tolerated Response: well well well Post Debridement 3x4x0.1 2.5x3.1x0.1 0.7x4.5x0.1 Measurements L x W x D (cm) Post Debridement 0.942 0.609 0.247 Volume: (cm) Procedures Performed: Compression Therapy Compression Therapy Compression Therapy Debridement Debridement Debridement Treatment Notes Wound #1 (Right  Malleolus) 1. Cleanse With Wound Cleanser 2. Periwound Care Moisturizing lotion TCA Ointment 3. Primary Dressing Applied Hydrofera Blue 4. Secondary Dressing ABD Pad Dry Gauze 6. Support Layer Applied 3 layer compression wrap Wound #2 (Right, Distal, Medial Foot) 1. Cleanse With Wound Cleanser 2. Periwound Care Moisturizing lotion TCA Ointment 3. Primary Dressing Applied Hydrofera Blue 4. Secondary Dressing ABD Pad Dry Gauze 6. Support Layer Applied 3 layer compression wrap Wound #3 (Right, Medial, Dorsal Foot) 1. Cleanse With Wound Cleanser 2. Periwound Care Moisturizing lotion TCA Ointment 3. Primary Dressing Applied Hydrofera Blue 4. Secondary Dressing ABD Pad Dry Gauze 6. Support Layer Applied 3 layer compression wrap Electronic Signature(s) Signed: 12/06/2019 4:55:35 PM By: Deon Pilling Signed: 01/02/2020 9:05:01 AM By: Levan Hurst RN, BSN Previous Signature: 11/22/2019 5:41:03 PM Version By: Linton Ham MD Previous Signature: 11/22/2019 6:05:23 PM Version By: Deon Pilling Entered By: Levan Hurst on 12/04/2019 16:40:41 -------------------------------------------------------------------------------- Multi-Disciplinary Care Plan Details Patient Name: Date of Service: CAINAN, TRULL 11/22/2019 2:45 PM Medical Record [id number] Patient Account Number: [id number] Date of Birth/Sex: Treating RN: 10/09/1970 (49 y.o. [name] Primary Care Makyle Eslick: Arsenio Katz Other Clinician: Referring Ameria Sanjurjo: Treating Elianys Conry/Extender:Robson, Otila Back, Valinda Party in Treatment: 0 Active Inactive Nutrition Nursing Diagnoses: Potential for alteratiion in Nutrition/Potential for imbalanced nutrition Goals: Patient/caregiver agrees to and verbalizes understanding of need to obtain nutritional consultation Date Initiated: 11/22/2019 Target Resolution Date: 12/28/2019 Goal Status: Active Interventions: Provide education on nutrition Treatment  Activities: Patient referred to Primary Care Physician for further nutritional evaluation : 11/22/2019 Notes: Orientation to the Wound Care Program Nursing Diagnoses: Knowledge deficit related to the wound healing center program Goals: Patient/caregiver will verbalize understanding of the [place] Date Initiated:  11/22/2019 Target Resolution Date: 12/28/2019 Goal Status: Active Interventions: Provide education on orientation to the wound center Notes: Pain, Acute or Chronic Nursing Diagnoses: Pain, acute or chronic: actual or potential Potential alteration in comfort, pain Goals: Patient will verbalize adequate pain control and receive pain control interventions during procedures as needed Date Initiated: 11/22/2019 Target Resolution Date: 12/27/2019 Goal Status: Active Patient/caregiver will verbalize comfort level met Date Initiated: 11/22/2019 Target Resolution Date: 12/28/2019 Goal Status: Active Interventions: Encourage patient to take pain medications as prescribed Provide education on pain management Reposition patient for comfort Treatment Activities: Administer pain control measures as ordered : 11/22/2019 Notes: Venous Leg Ulcer Nursing Diagnoses: Potential for venous Insuffiency (use before diagnosis confirmed) Goals: Patient will maintain optimal edema control Date Initiated: 11/22/2019 Target Resolution Date: 12/28/2019 Goal Status: Active Interventions: Provide education on venous insufficiency Notes: Electronic Signature(s) Signed: 11/22/2019 6:05:23 PM By: Deaton, Bobbi Entered By: Deaton, Bobbi on 11/22/2019 15:49:03 -------------------------------------------------------------------------------- Pain Assessment Details Patient Name: Date of Service: Conery, Hyrum 11/22/2019 2:45 PM Medical Record Number:[id number] Patient Account Number: [id number] Date of Birth/Sex: Treating RN: 02/10/1971 (48 y.o. M) Epps, Carrie Primary Care : BOONE,  ANGELA Other Clinician: Referring : Treating /Extender:Robson, Michael BOONE, ANGELA Weeks in Treatment: 0 Active Problems Location of Pain Severity and Description of Pain Patient Has Paino Yes Site Locations With Dressing Change: Yes Duration of the Pain. Constant / Intermittento Intermittent How Long Does it Lasto Hours: Minutes: 15 Rate the pain. Current Pain Level: 0 Worst Pain Level: 8 Least Pain Level: 0 Tolerable Pain Level: 5 Character of Pain Describe the Pain: Burning, Throbbing Pain Management and Medication Current Pain Management: Medication: Yes Cold Application: No Rest: Yes Massage: No Activity: No T.E.N.S.: No Heat Application: No Leg drop or elevation: No Is the Current Pain Management Adequate: Inadequate How does your wound impact your activities of daily livingo Sleep: Yes Bathing: No Appetite: Yes Relationship With Others: No Bladder Continence: No Emotions: No Bowel Continence: No Work: No Toileting: No Drive: No Dressing: No Hobbies: No Electronic Signature(s) Signed: 11/22/2019 5:55:28 PM By: Epps, Carrie RN Entered By: Epps, Carrie on 11/22/2019 15:36:12 -------------------------------------------------------------------------------- Patient/Caregiver Education Details Patient Name: Date of Service: Easom, [name] 3/4/2021andnbsp2:45 PM Medical Record Number:[id number] Patient Account Number: [id number] Date of Birth/Gender: 12/24/1970 (48 y.o. M) Treating RN: Deaton, Bobbi Primary Care Physician: BOONE, ANGELA Other Clinician: Referring Physician: Treating Physician/Extender:Robson, Michael BOONE, ANGELA Weeks in Treatment: 0 Education Assessment Education Provided To: Patient Education Topics Provided Welcome To The Wound Care Center: Handouts: Welcome To The Wound Care Center Methods: Explain/Verbal Responses: Reinforcements needed Electronic Signature(s) Signed: 11/22/2019 6:05:23 PM By: Deaton,  Bobbi Entered By: Deaton, Bobbi on 11/22/2019 15:49:12 -------------------------------------------------------------------------------- Wound Assessment Details Patient Name: Date of Service: Cervi, Khyle 11/22/2019 2:45 PM Medical Record Number:[id number] Patient Account Number: [id number] Date of Birth/Sex: Treating RN: 09/23/1970 (48 y.o. M) Deaton, Bobbi Primary Care : BOONE, ANGELA Other Clinician: Referring : Treating /Extender:Robson, Michael BOONE, ANGELA Weeks in Treatment: 0 Wound Status Wound Number: 1 Primary Etiology: Venous Leg Ulcer Wound Location: Right Malleolus Wound Status: Open Wounding Event: Gradually Appeared Comorbid History: Paraplegia Date Acquired: 06/21/2019 Weeks Of Treatment: 0 Clustered Wound: No Photos Wound Measurements Length: (cm) 3 % Reduct Width: (cm) 4 % Reduct Depth: (cm) 0.1 Tunnelin Area: (cm) 9.425 Undermi Volume: (cm) 0.942 Wound Description Full Thickness Without Exposed Support Foul Odo Classification: Structures Slough/F Exudate Medium Amount: Exudate Serosanguineous Type: Exudate red, brown Color: Wound Bed Granulation Amount: None Present (0%) Necrotic Amount: Large (67-100%) Fascia E   Necrotic Quality: Adherent Slough Fat Laye Tendon E Muscle E Joint Ex Bone Exp r After Cleansing: No ibrino Yes Exposed Structure xposed: No r (Subcutaneous Tissue) Exposed: Yes xposed: No xposed: No posed: No osed: No ion in Area: 0% ion in Volume: 0% g: No ning: No Electronic Signature(s) Signed: 11/23/2019 4:01:29 PM By: Mikeal Hawthorne EMT/HBOT Signed: 11/23/2019 5:34:19 PM By: Deon Pilling Previous Signature: 11/22/2019 5:55:28 PM Version By: Carlene Coria RN Entered By: Mikeal Hawthorne on 11/23/2019 11:42:41 -------------------------------------------------------------------------------- Wound Assessment Details Patient Name: Date of Service: CHEN, SAADEH 11/22/2019 2:45 PM Medical Record  [id number] Patient Account Number: [id number] Date of Birth/Sex: Treating RN: Dec 25, 1970 (49 y.o. Janyth Contes Primary Care Neola Worrall: Arsenio Katz Other Clinician: Referring Marena Witts: Treating Stellan Vick/Extender:Robson, Otila Back, ANGELA Weeks in Treatment: 0 Wound Status Wound Number: 2 Primary Etiology: Trauma, Other Wound Location: Right Calcaneus - Medial Wound Status: Open Wounding Event: Gradually Appeared Comorbid History: Paraplegia Date Acquired: 06/21/2019 Weeks Of Treatment: 0 Clustered Wound: No Photos Wound Measurements Length: (cm) 2.5 % Reduct Width: (cm) 3.1 % Reduct Depth: (cm) 0.1 Epitheli Area: (cm) 6.087 Tunneli Volume: (cm) 0.609 Undermi Wound Description Classification: Full Thickness Without Exposed Support Foul Odo Structures Slough/F Exudate Medium Amount: Exudate Serosanguineous Type: Exudate red, brown Color: Wound Bed Granulation Amount: None Present (0%) Necrotic Amount: Large (67-100%) Fascia E Necrotic Quality: Adherent Slough Fat Laye Tendon E Muscle E Joint Ex Bone Exp r After Cleansing: No ibrino Yes Exposed Structure xposed: No r (Subcutaneous Tissue) Exposed: No xposed: No xposed: No posed: No osed: No ion in Area: 0% ion in Volume: 0% alization: None ng: No ning: No Electronic Signature(s) Signed: 01/02/2020 9:05:01 AM By: Levan Hurst RN, BSN Previous Signature: 11/23/2019 4:01:29 PM Version By: Mikeal Hawthorne EMT/HBOT Previous Signature: 11/23/2019 5:34:19 PM Version By: Deon Pilling Previous Signature: 11/22/2019 5:55:28 PM Version By: Carlene Coria RN Entered By: Levan Hurst on 12/04/2019 16:40:26 -------------------------------------------------------------------------------- Wound Assessment Details Patient Name: Date of Service: DWON, SKY 11/22/2019 2:45 PM Medical Record [id number] Patient Account Number: [id number] Date of Birth/Sex: Treating RN: 1970-12-03 (49 y.o. [name] Primary Care Karren Newland: Arsenio Katz Other Clinician: Referring Katherene Dinino: Treating Jacoya Bauman/Extender:Robson, Otila Back, ANGELA Weeks in Treatment: 0 Wound Status Wound Number: 3 Primary Etiology: Venous Leg Ulcer Wound Location: Right Foot - Dorsal, Medial Wound Status: Open Wounding Event: Gradually Appeared Comorbid History: Paraplegia Date Acquired: 06/21/2019 Weeks Of Treatment: 0 Clustered Wound: No Photos Wound Measurements Length: (cm) 0.7 % Reduct Width: (cm) 4.5 % Reduct Depth: (cm) 0.1 Epitheli Area: (cm) 2.474 Tunneli Volume: (cm) 0.247 Undermi Wound Description Classification: Full Thickness Without Exposed Support Foul Odo Structures Slough/F Exudate Medium Amount: Exudate Serosanguineous Type: Exudate red, brown Color: Wound Bed Granulation Amount: Small (1-33%) Granulation Quality: Pink Fascia E Necrotic Amount: Large (67-100%) Fat Laye Necrotic Quality: Adherent Slough Tendon E Muscle E Joint Ex Bone Exp r After Cleansing: No ibrino Yes Exposed Structure xposed: No r (Subcutaneous Tissue) Exposed: Yes xposed: No xposed: No posed: No osed: No ion in Area: 0% ion in Volume: 0% alization: None ng: No ning: No Electronic Signature(s) Signed: 11/23/2019 4:01:29 PM By: Mikeal Hawthorne EMT/HBOT Signed: 11/23/2019 5:34:19 PM By: Deon Pilling Previous Signature: 11/22/2019 5:55:28 PM Version By: Carlene Coria RN Entered By: Mikeal Hawthorne on 11/23/2019 11:43:44 -------------------------------------------------------------------------------- Vitals Details Patient Name: Date of Service: KAINAN, PATTY 11/22/2019 2:45 PM Medical Record [id number] Patient Account Number: [id number] Date of Birth/Sex: Treating RN: October 08, 1970 (49 y.o. [name] Primary Care Remijio Holleran: Arsenio Katz Other Clinician:  Referring : Treating /Extender:Robson, Michael BOONE, ANGELA Weeks in Treatment: 0 Vital Signs Time Taken:  14:54 Temperature (°F): 98.4 Height (in): 72 Pulse (bpm): 81 Source: Stated Respiratory Rate (breaths/min): 18 Weight (lbs): 150 Blood Pressure (mmHg): 154/88 Source: Stated Reference Range: 80 - 120 mg / dl Body Mass Index (BMI): 20.3 Electronic Signature(s) Signed: 11/22/2019 5:55:28 PM By: Epps, Carrie RN Entered By: Epps, Carrie on 11/22/2019 15:09:24 

## 2019-11-23 ENCOUNTER — Telehealth (HOSPITAL_COMMUNITY): Payer: Self-pay

## 2019-11-23 ENCOUNTER — Telehealth (HOSPITAL_COMMUNITY): Payer: Self-pay | Admitting: *Deleted

## 2019-11-23 NOTE — Telephone Encounter (Signed)
11/23/18 8:31 am Voicemail unavailable.  Attempting to contact patient to schedule exam requested by Dr. Leanord Hawking.

## 2019-11-23 NOTE — Telephone Encounter (Signed)
Attempted to contact patient to schedule appointment per referral from Dr. Leanord Hawking.

## 2019-11-23 NOTE — Telephone Encounter (Signed)
Opened in error

## 2019-11-28 DIAGNOSIS — R569 Unspecified convulsions: Secondary | ICD-10-CM | POA: Diagnosis not present

## 2019-11-28 DIAGNOSIS — F909 Attention-deficit hyperactivity disorder, unspecified type: Secondary | ICD-10-CM | POA: Diagnosis not present

## 2019-11-28 DIAGNOSIS — I10 Essential (primary) hypertension: Secondary | ICD-10-CM | POA: Diagnosis not present

## 2019-11-28 DIAGNOSIS — Z299 Encounter for prophylactic measures, unspecified: Secondary | ICD-10-CM | POA: Diagnosis not present

## 2019-11-28 DIAGNOSIS — I83009 Varicose veins of unspecified lower extremity with ulcer of unspecified site: Secondary | ICD-10-CM | POA: Diagnosis not present

## 2019-11-29 ENCOUNTER — Other Ambulatory Visit: Payer: Self-pay

## 2019-11-29 ENCOUNTER — Other Ambulatory Visit: Payer: Self-pay | Admitting: Cardiology

## 2019-11-29 ENCOUNTER — Encounter (HOSPITAL_BASED_OUTPATIENT_CLINIC_OR_DEPARTMENT_OTHER): Payer: PPO | Admitting: Internal Medicine

## 2019-11-29 DIAGNOSIS — M79671 Pain in right foot: Secondary | ICD-10-CM | POA: Diagnosis not present

## 2019-11-29 DIAGNOSIS — Z09 Encounter for follow-up examination after completed treatment for conditions other than malignant neoplasm: Secondary | ICD-10-CM | POA: Diagnosis not present

## 2019-11-29 DIAGNOSIS — M2042 Other hammer toe(s) (acquired), left foot: Secondary | ICD-10-CM | POA: Diagnosis not present

## 2019-11-29 DIAGNOSIS — I87311 Chronic venous hypertension (idiopathic) with ulcer of right lower extremity: Secondary | ICD-10-CM | POA: Diagnosis not present

## 2019-11-29 DIAGNOSIS — L97521 Non-pressure chronic ulcer of other part of left foot limited to breakdown of skin: Secondary | ICD-10-CM | POA: Diagnosis not present

## 2019-11-29 DIAGNOSIS — L97511 Non-pressure chronic ulcer of other part of right foot limited to breakdown of skin: Secondary | ICD-10-CM | POA: Diagnosis not present

## 2019-11-29 DIAGNOSIS — M79672 Pain in left foot: Secondary | ICD-10-CM | POA: Diagnosis not present

## 2019-11-29 DIAGNOSIS — M722 Plantar fascial fibromatosis: Secondary | ICD-10-CM | POA: Diagnosis not present

## 2019-12-01 NOTE — Progress Notes (Signed)
YEIREN, Daniel Arroyo ([id number]) Visit Report for 11/29/2019 HPI Details Patient Name: Date of Service: Daniel Arroyo, Daniel Arroyo 11/29/2019 2:30 PM Medical Record Number:[id number] Patient Account Number: [id number] Date of Birth/Sex: Treating RN: [birth year] (48 y.o. [name]: Salley Slaughter Other Clinician: Referring Provider: Treating Provider/Extender:Therma Lasure, Ronita Hipps, Corliss Blacker in Treatment: 1 History of Present Illness HPI Description: ADMISSION 11/22/2019 This is a 49 year old man who has a history of chronic venous insufficiency and lymphedema. He has been dealing with wounds in his right medial malleolus for several months. He was actually seen by Dr. Lynden Ang at the wound care center. They were using silver alginate with his own juxta lite stockings. The patient states that the wounds fluctuated between improving and then worsening. He was seen by Dr. Myra Gianotti at [facility] in June 2020. He had arterial arterial studies that showed triphasic waveforms on the right with noncompressible ABIs at 1.39 however great toe pressure was normal at 0.84. There does not seem to provide a lot of evidence that there was an arterial component. He also had a DVT rule out study which was negative for DVT there was no evidence of superficial venous thrombosis. Dr. Myra Gianotti did not think he had an arterial pathology. He put him in 20/30 compression stockings. I am a bit surprised he did not do reflux studies. The patient states the wounds are painful he uses Tylenol and aspirin. 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 11/29/19; the patient has done nicely and compression. The area over his medial malleolus is healed. The  areas on his medial foot and dorsal ankle are just about closed. we have been using Hydrofera Blue under 3 layer compression. He has done well the areas are a lot better. Via my admission note he has juxtlite stockings. at this point I'm not sure what the history is. I'm not certain whether he had the stockings before the onset of these wounds or only obtained him afterwards Electronic Signature(s) Signed: 12/01/2019 7:03:15 AM By: Baltazar Najjar MD Entered By: Baltazar Najjar on 12/01/2019 06:12:06 -------------------------------------------------------------------------------- Physical Exam Details Patient Name: Date of Service: Daniel Arroyo, Daniel Arroyo 11/29/2019 2:30 PM Medical Record [id number] Patient Account Number: [id number] Date of Birth/Sex: Treating RN: 1971/01/07 (49 y.o. [name]: Other Clinician: Salley Slaughter Referring Provider: Treating Provider/Extender:Ndrew Creason, Ronita Hipps, Marylene Land Weeks in Treatment: 1 Constitutional Patient is hypertensive.. Pulse regular and within target range for patient.Marland Kitchen Respirations regular, non-labored and within target range.. Temperature is normal and within the target range for the patient.Marland Kitchen Appears in no distress. Cardiovascular pulses are palpable in thhe right foot. looks to be chronic venous insufficiency but without a lot of edema.. Integumentary (Hair, Skin) what looks to be changes of chronic venous insufficiency. Notes wound exam; substantial superficiall wound areas from last time are actually a lot better. The area on the medial malleolus is closed on the right. Area on the dorsal foot is just about closed in the ankle crease. Area on the medial heel also looks considerably better and smaller. There was no need for debridement in any area Electronic Signature(s) Signed: 12/01/2019 7:03:15 AM By: Baltazar Najjar MD Entered By: Baltazar Najjar on 12/01/2019  06:14:41 -------------------------------------------------------------------------------- Physician Orders Details Patient Name: Date of Service: Daniel Arroyo, Daniel Arroyo 11/29/2019 2:30 PM  Medical Record [id number] Patient Account Number: [id number] Date of Birth/Sex: Treating RN: 1971-05-08 (49 y.o. 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 - Thursday Dressing Change Frequency Do not change entire dressing for one week. Skin Barriers/Peri-Wound Care TCA Cream or Ointment - mix with lotion. Wound Cleansing May shower with protection. - use a cast protector. Primary Wound Dressing Wound #2 Right,Medial Calcaneus Hydrofera Blue - classic Wound #3 Right,Medial,Dorsal Foot Hydrofera Blue - classic Secondary Dressing Dry Gauze ABD pad Edema Control 3 Layer Compression System - Right Lower Extremity Avoid standing for long periods of time Elevate legs to the level of the heart or above for 30 minutes daily and/or when sitting, a frequency of: - throughout the day. Exercise regularly Electronic Signature(s) Signed: 11/29/2019 6:00:33 PM By: Deon Pilling Signed: 12/01/2019 7:03:15 AM By: Linton Ham MD Entered By: Deon Pilling on 11/29/2019 14:49:33 -------------------------------------------------------------------------------- Problem List Details Patient Name: Date of Service: Daniel Arroyo, Daniel Arroyo 11/29/2019 2:30 PM Medical Record [id number] Patient  Account Number: [id number] Date of Birth/Sex: Treating RN: 1971-08-15 (49 y.o. [name] Primary Care Provider: Arsenio Katz Other Clinician: Referring Provider: Treating Provider/Extender:Horrace Hanak, Otila Back, Valinda Party in Treatment: 1 Active Problems ICD-10 Evaluated Encounter Code Description Active Date Today Diagnosis I87.331 Chronic venous hypertension (idiopathic) with ulcer 11/22/2019 No Yes and inflammation of right lower extremity L97.311 Non-pressure chronic ulcer of right ankle limited to 11/22/2019 No Yes breakdown of skin L97.511 Non-pressure chronic ulcer of other part of right foot 11/22/2019 No Yes limited to breakdown of skin Inactive Problems Resolved Problems Electronic Signature(s) Signed: 12/01/2019 7:03:15 AM By: Linton Ham MD Previous Signature: 11/29/2019 6:00:33 PM Version By: Deon Pilling Entered By: Linton Ham on 12/01/2019 06:05:56 -------------------------------------------------------------------------------- Progress Note Details Patient Name: Date of Service: Daniel Arroyo, Daniel Arroyo 11/29/2019 2:30 PM Medical Record [id number] Patient Account Number: [id number] Date of Birth/Sex: Treating RN: March 31, 1971 (49 y.o. [name] Primary Care Provider: Arsenio Katz Other Clinician: Referring Provider: Treating Provider/Extender:Megean Fabio, Otila Back, Levada Dy Weeks in Treatment: 1 Subjective History of Present Illness (HPI) ADMISSION 11/22/2019 This is a 49 year old man who has a history of chronic venous insufficiency and lymphedema. He has been dealing with wounds in his right medial malleolus for several months. He was actually seen by Dr. Tye Maryland at the wound care center. They were using silver alginate with his own juxta lite stockings. The patient states that the wounds fluctuated between improving and then worsening. He was seen by Dr. Trula Slade at [facility] in June 2020. He had arterial arterial studies that showed  triphasic waveforms on the right with noncompressible ABIs at 1.39 however great toe pressure was normal at 0.84. There does not seem to provide a lot of evidence that there was an arterial component. He also had a DVT rule out study which was negative for DVT there was no evidence of superficial venous thrombosis. Dr. Trula Slade did not think he had an arterial pathology. He put him in 20/30 compression stockings. I am a bit surprised he did not do reflux studies. The patient states the wounds are painful he uses Tylenol and aspirin.  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 11/29/19; the patient has done nicely and compression. The area over his medial malleolus is healed. The areas on his medial foot and dorsal ankle are just about closed. we have been using Hydrofera Blue under 3 layer compression. He has done well the areas are a lot better. Via my admission note he has juxtlite stockings. at this point I'm not sure what the history is. I'm not certain whether he had the stockings before the onset of these wounds or only obtained him afterwards Objective Constitutional Patient is hypertensive.. Pulse regular and within target range for patient.Marland Kitchen Respirations regular, non-labored and within target range.. Temperature is normal and within the target range for the patient.Marland Kitchen Appears in no distress. Vitals Time Taken: 2:24 PM, Height: 72 in, Weight: 150 lbs, BMI: 20.3, Temperature: 98.4 F, Pulse: 90 bpm, Respiratory Rate: 18 breaths/min, Blood Pressure: 136/92 mmHg. Cardiovascular pulses are palpable in thhe right foot. looks to be chronic venous insufficiency but without a lot of edema.. General Notes: wound exam; substantial superficiall  wound areas from last time are actually a lot better. The area on the medial malleolus is closed on the right. Area on the dorsal foot is just about closed in the ankle crease. Area on the medial heel also looks considerably better and smaller. There was no need for debridement in any area Integumentary (Hair, Skin) what looks to be changes of chronic venous insufficiency. Wound #1 status is Healed - Epithelialized. Original cause of wound was Gradually Appeared. The wound is located on the Right Malleolus. The wound measures 0cm length x 0cm width x 0cm depth; 0cm^2 area and 0cm^3 volume. There is Fat Layer (Subcutaneous Tissue) Exposed exposed. There is no tunneling or undermining noted. There is a small amount of serosanguineous drainage noted. The wound margin is flat and intact. There is large (67-100%) pink granulation within the wound bed. There is a small (1-33%) amount of necrotic tissue within the wound bed including Adherent Slough. Wound #2 status is Open. Original cause of wound was Gradually Appeared. The wound is located on the Right,Medial Calcaneus. The wound measures 0.2cm length x 0.2cm width x 0.1cm depth; 0.031cm^2 area and 0.003cm^3 volume. There is no tunneling or undermining noted. There is a small amount of serosanguineous drainage noted. The wound margin is flat and intact. There is large (67-100%) pink granulation within the wound bed. There is a small (1-33%) amount of necrotic tissue within the wound bed including Adherent Slough. Wound #3 status is Open. Original cause of wound was Gradually Appeared. The wound is located on the Right,Medial,Dorsal Foot. The wound measures 0.3cm length x 0.3cm width x 0.1cm depth; 0.071cm^2 area and 0.007cm^3 volume. Assessment Active Problems ICD-10 Chronic venous hypertension (idiopathic) with ulcer and inflammation of right lower extremity Non-pressure chronic ulcer of right ankle limited to breakdown of skin Non-pressure  chronic ulcer of other part of right foot limited to breakdown of skin Procedures Wound #2 Pre-procedure diagnosis of Wound #2 is a Trauma, Other located on the Right,Medial Calcaneus . There was a Three Layer Compression Therapy Procedure by Zenaida Deed, RN. Post procedure Diagnosis Wound #2: Same as Pre-Procedure Wound #3 Pre-procedure diagnosis of Wound #3 is a Venous Leg Ulcer located on the Right,Medial,Dorsal  Foot . There was a Three Layer Compression Therapy Procedure by Zenaida Deed, RN. Post procedure Diagnosis Wound #3: Same as Pre-Procedure Plan Follow-up Appointments: Return Appointment in 1 week. - Thursday Dressing Change Frequency: Do not change entire dressing for one week. Skin Barriers/Peri-Wound Care: TCA Cream or Ointment - mix with lotion. Wound Cleansing: May shower with protection. - use a cast protector. Primary Wound Dressing: Wound #2 Right,Medial Calcaneus: Hydrofera Blue - classic Wound #3 Right,Medial,Dorsal Foot: Hydrofera Blue - classic Secondary Dressing: Dry Gauze ABD pad Edema Control: 3 Layer Compression System - Right Lower Extremity Avoid standing for long periods of time Elevate legs to the level of the heart or above for 30 minutes daily and/or when sitting, a frequency of: - throughout the day. Exercise regularly #1 TCA, continue Hydrofera Blue under 3 layer compression. #2 he may be closed by next week. I'll need to go over the history here whether he was wearing his juxta lite at the time these wounds on setted. According to my notes this was several months ago. #3 is already been to see vein and vascular. He does not have an arterial issue. I think he probably needs venous reflux studies. Electronic Signature(s) Signed: 12/01/2019 7:03:15 AM By: Baltazar Najjar MD Entered By: Baltazar Najjar on 12/01/2019 06:17:22 -------------------------------------------------------------------------------- SuperBill Details Patient  Name: Date of Service: DAVAUGHN, Daniel Arroyo 11/29/2019 Medical Record Number:[id number] Patient Account Number: [id number] Date of Birth/Sex: Treating RN: 04-25-1971 (48 y.o. 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 L97.511 Non-pressure chronic ulcer of other part of right foot limited to breakdown of skin Facility Procedures The patient participates with Medicare or their insurance follows the Medicare Facility Guidelines: CPT4 Code Description Modifier Quantity [id number] (Facility Use Only) [phone] - APPLY MULTLAY COMPRS LWR RT LEG 1 Physician Procedures CPT4 Code Description: [id number] - WC PHYS LEVEL 3 - EST PT ICD-10 Diagnosis Description L97.311 Non-pressure chronic ulcer of right ankle limited to break I87.331 Chronic venous hypertension (idiopathic) with ulcer and in lower extremity Modifier: down of skin flammation of ri Quantity: 1 ght Electronic Signature(s) Signed: 12/01/2019 7:03:15 AM By: Baltazar Najjar MD Previous Signature: 11/29/2019 6:00:33 PM Version By: Shawn Stall Entered By: Baltazar Najjar on 12/01/2019 06:17:53

## 2019-12-06 ENCOUNTER — Other Ambulatory Visit: Payer: Self-pay

## 2019-12-06 ENCOUNTER — Encounter (HOSPITAL_BASED_OUTPATIENT_CLINIC_OR_DEPARTMENT_OTHER): Payer: PPO | Admitting: Internal Medicine

## 2019-12-06 DIAGNOSIS — I87331 Chronic venous hypertension (idiopathic) with ulcer and inflammation of right lower extremity: Secondary | ICD-10-CM | POA: Diagnosis not present

## 2019-12-06 DIAGNOSIS — L97511 Non-pressure chronic ulcer of other part of right foot limited to breakdown of skin: Secondary | ICD-10-CM | POA: Diagnosis not present

## 2019-12-06 DIAGNOSIS — L97521 Non-pressure chronic ulcer of other part of left foot limited to breakdown of skin: Secondary | ICD-10-CM | POA: Diagnosis not present

## 2019-12-06 DIAGNOSIS — L97311 Non-pressure chronic ulcer of right ankle limited to breakdown of skin: Secondary | ICD-10-CM | POA: Diagnosis not present

## 2019-12-06 DIAGNOSIS — Z09 Encounter for follow-up examination after completed treatment for conditions other than malignant neoplasm: Secondary | ICD-10-CM | POA: Diagnosis not present

## 2019-12-06 NOTE — Progress Notes (Signed)
[name], [name] ([id number]) Visit Report for 12/06/2019 Arrival Information Details Patient Name: Date of Service: Daniel, Arroyo 12/06/2019 9:30 AM Medical Record Number:[id number] Patient Account Number: [id number] Date of Birth/Sex: Treating RN: 1971/05/08 (49 y.o. Katherina Right Primary Care Arul Farabee: Salley Slaughter Other Clinician: Referring Keandrea Tapley: Treating Riese Hellard/Extender:Robson, Ronita Hipps, Corliss Blacker in Treatment: 2 Visit Information History Since Last Visit Added or deleted any medications: No Patient Arrived: Cane Any new allergies or adverse reactions: No Arrival Time: 09:37 Had a fall or experienced change in No Accompanied By: dad activities of daily living that may affect Transfer Assistance: None risk of falls: Patient Identification Verified: Yes Signs or symptoms of abuse/neglect since last No Secondary Verification Process Yes visito Completed: Hospitalized since last visit: No Patient Requires Transmission- No Implantable device outside of the clinic excluding No Based Precautions: cellular tissue based products placed in the center Patient Has Alerts: Yes since last visit: Patient Alerts: 02/2019 ABI: [place] Has Dressing in Place as Prescribed: Yes 02/2019 TBI:L0.85, Has Compression in Place as Prescribed: Yes R0.84 Pain Present Now: No Electronic Signature(s) Signed: 12/06/2019 4:36:06 PM By: Cherylin Mylar Entered By: Cherylin Mylar on 12/06/2019 09:40:43 -------------------------------------------------------------------------------- Clinic Level of Care Assessment Details Patient Name: Date of Service: Daniel, Arroyo 12/06/2019 9:30 AM Medical Record Number:[id number] Patient Account Number: [id number] Date of Birth/Sex: Treating RN: 08-Dec-1970 (49 y.o. [name] Primary Care Maddyx Vallie: Salley Slaughter Other Clinician: Referring Vuk Skillern: Treating Elwyn Lowden/Extender:Robson, Ronita Hipps, Corliss Blacker in Treatment:  2 Clinic Level of Care Assessment Items TOOL 4 Quantity Score X - Use when only an EandM is performed on FOLLOW-UP visit 1 0 ASSESSMENTS - Nursing Assessment / Reassessment X - Reassessment of Co-morbidities (includes updates in patient status) 1 10 X - Reassessment of Adherence to Treatment Plan 1 5 ASSESSMENTS - Wound and Skin Assessment / Reassessment []  - Simple Wound Assessment / Reassessment - one wound 0 X - Complex Wound Assessment / Reassessment - multiple wounds 2 5 X - Dermatologic / Skin Assessment (not related to wound area) 1 10 ASSESSMENTS - Focused Assessment X - Circumferential Edema Measurements - multi extremities 1 5 X - Nutritional Assessment / Counseling / Intervention 1 10 []  - Lower Extremity Assessment (monofilament, tuning fork, pulses) 0 []  - Peripheral Arterial Disease Assessment (using hand held doppler) 0 ASSESSMENTS - Ostomy and/or Continence Assessment and Care []  - Incontinence Assessment and Management 0 []  - Ostomy Care Assessment and Management (repouching, etc.) 0 PROCESS - Coordination of Care []  - Simple Patient / Family Education for ongoing care 0 X - Complex (extensive) Patient / Family Education for ongoing care 1 20 X - Staff obtains , Records, Test Results / Process Orders 1 10 []  - Staff telephones HHA, Nursing Homes / Clarify orders / etc 0 []  - Routine Transfer to another Facility (non-emergent condition) 0 []  - Routine Hospital Admission (non-emergent condition) 0 []  - New Admissions / / Ordering NPWT, Apligraf, etc. 0 []  - Emergency Hospital Admission (emergent condition) 0 []  - Simple Discharge Coordination 0 X - Complex (extensive) Discharge Coordination 1 15 PROCESS - Special Needs []  - Pediatric / Minor Patient Management 0 []  - Isolation Patient Management 0 []  - Hearing / Language / Visual special needs 0 []  - Assessment of Community assistance (transportation, D/C planning, etc.) 0 []  -  Additional assistance / Altered mentation 0 []  - Support Surface(s) Assessment (bed, cushion, seat, etc.) 0 INTERVENTIONS - Wound Cleansing / Measurement []  - Simple Wound Cleansing - one wound 0  X - Complex Wound Cleansing - multiple wounds 2 5 X - Wound Imaging (photographs - any number of wounds) 1 5 []  - Wound Tracing (instead of photographs) 0 []  - Simple Wound Measurement - one wound 0 X - Complex Wound Measurement - multiple wounds 2 5 INTERVENTIONS - Wound Dressings []  - Small Wound Dressing one or multiple wounds 0 []  - Medium Wound Dressing one or multiple wounds 0 []  - Large Wound Dressing one or multiple wounds 0 []  - Application of Medications - topical 0 []  - Application of Medications - injection 0 INTERVENTIONS - Miscellaneous []  - External ear exam 0 []  - Specimen Collection (cultures, biopsies, blood, body fluids, etc.) 0 []  - Specimen(s) / Culture(s) sent or taken to Lab for analysis 0 []  - Patient Transfer (multiple staff / / Similar devices) 0 []  - Simple Staple / Suture removal (25 or less) 0 []  - Complex Staple / Suture removal (26 or more) 0 []  - Hypo / Hyperglycemic Management (close monitor of Blood Glucose) 0 []  - Ankle / Brachial Index (ABI) - do not check if billed separately 0 X - Vital Signs 1 5 Has the patient been seen at the hospital within the last three years: Yes Total Score: 125 Level Of Care: New/Established - Level 4 Electronic Signature(s) Signed: 12/06/2019 4:55:35 PM By: Entered By: on 12/06/2019 09:57:45 -------------------------------------------------------------------------------- Lower Extremity Assessment Details Patient Name: Date of Service: Daniel, Arroyo 12/06/2019 9:30 AM Medical Record Number:[id number] Patient Account Number: Date of Birth/Sex: Treating RN: [birth year] (49 y.o. Primary Care Ladaja Yusupov: Other Clinician: Referring Torri Langston:  Treating Shylah Dossantos/Extender:Robson, Nurse, adult, ANGELA Weeks in Treatment: 2 Edema Assessment Assessed: [Left: No] [Right: No] Edema: [Left: N] [Right: o] Calf Left: Right: Point of Measurement: 45 cm From Medial Instep cm 30.5 cm Ankle Left: Right: Point of Measurement: 10 cm From Medial Instep cm 20.5 cm Vascular Assessment Pulses: Dorsalis Pedis Palpable: [Right:Yes] Electronic Signature(s) Signed: 12/06/2019 4:36:06 PM By: Entered By: on 12/06/2019 09:45:21 -------------------------------------------------------------------------------- Multi Wound Chart Details Patient Name: Date of Service: Daniel, Arroyo 12/06/2019 9:30 AM Medical Record Shawn Stall Patient Account Number: 12/08/2019 Date of Birth/Sex: Treating RN: May 26, 1971 (48 y.o. 12/08/2019 Primary Care Kaly Mcquary: [id number] Other Clinician: Referring Tavarius Grewe: Treating Julian Askin/Extender:Robson, 08/30/1971, ANGELA Weeks in Treatment: 2 Vital Signs Height(in): 72 Pulse(bpm): 69 Weight(lbs): 150 Blood Pressure(mmHg): 117/60 Body Mass Index(BMI): 20 Temperature(F): 98.3 Respiratory 19 Rate(breaths/min): Photos: [2:No Photos] [3:No Photos] [N/A:N/A] Wound Location: [2:Right, Medial Calcaneus] [3:Right, Medial, Dorsal Foot] [N/A:N/A] Wounding Event: [2:Gradually Appeared] [3:Gradually Appeared] [N/A:N/A] Primary Etiology: [2:Trauma, Other] [3:Venous Leg Ulcer] [N/A:N/A] Comorbid History: [2:Paraplegia] [3:Paraplegia] [N/A:N/A] Date Acquired: [2:06/21/2019] [3:06/21/2019] [N/A:N/A] Weeks of Treatment: [2:2] [3:2] [N/A:N/A] Wound Status: [2:Healed - Epithelialized] [3:Healed - Epithelialized] [N/A:N/A] Measurements L x W x D [2:0x0x0] [3:0x0x0] [N/A:N/A] (cm) Area (cm) : [2:0] [3:0] [N/A:N/A] Volume (cm) : [2:0] [3:0] [N/A:N/A] % Reduction in Area: [2:100.00%] [3:100.00%] [N/A:N/A] % Reduction in Volume: [2:100.00%] [3:100.00%]  [N/A:N/A] Classification: [2:Full Thickness Without Exposed Support Structures Exposed Support Structures] [3:Full Thickness Without] [N/A:N/A] Exudate Amount: [2:None Present] [3:None Present] [N/A:N/A] Wound Margin: [2:Flat and Intact] [3:Distinct, outline attached N/A] Granulation Amount: [2:None Present (0%)] [3:None Present (0%)] [N/A:N/A] Necrotic Amount: [2:None Present (0%)] [3:None Present (0%)] [N/A:N/A] Exposed Structures: [2:Fascia: No Fat Layer (Subcutaneous Fat Layer (Subcutaneous Tissue) Exposed: No Tendon: No Muscle: No Joint: No Bone: No Large (67-100%)] [3:Fascia: No Tissue) Exposed: No Tendon: No Muscle: No Joint:  No Bone: No Large (67-100%)] [N/A:N/A N/A] Treatment Notes Electronic Signature(s) Signed: 12/06/2019 4:55:35 PM By: Shawn Stall Signed: 12/06/2019 7:02:26 PM By: Baltazar Najjar MD Entered By: Baltazar Najjar on 12/06/2019 10:47:16 -------------------------------------------------------------------------------- Multi-Disciplinary Care Plan Details Patient Name: Date of Service: Daniel, Arroyo 12/06/2019 9:30 AM Medical Record Number:[id number] Patient Account Number: [id number] Date of Birth/Sex: Treating RN: [birth year] (48 y.o. [name] Primary Care Kamry Faraci: Salley Slaughter Other Clinician: Referring Mahima Hottle: Treating Joniya Boberg/Extender:Robson, Ronita Hipps, Marylene Land Weeks in Treatment: 2 Active Inactive Electronic Signature(s) Signed: 12/06/2019 4:55:35 PM By: Shawn Stall Entered By: Shawn Stall on 12/06/2019 09:52:05 -------------------------------------------------------------------------------- Pain Assessment Details Patient Name: Date of Service: Daniel, Arroyo 12/06/2019 9:30 AM Medical Record Number:[id number] Patient Account Number: [id number] Date of Birth/Sex: Treating RN: 1971-03-15 (48 y.o. Katherina Right Primary Care Katie Moch: Salley Slaughter Other Clinician: Referring Annel Zunker: Treating Chasey Dull/Extender:Robson,  Ronita Hipps, Marylene Land Weeks in Treatment: 2 Active Problems Location of Pain Severity and Description of Pain Patient Has Paino No Site Locations Pain Management and Medication Current Pain Management: Electronic Signature(s) Signed: 12/06/2019 4:36:06 PM By: Cherylin Mylar Entered By: Cherylin Mylar on 12/06/2019 09:41:12 -------------------------------------------------------------------------------- Patient/Caregiver Education Details Patient Name: Date of Service: [name] 3/18/2021andnbsp9:30 AM Medical Record [phone] Patient Account Number: [id number] Date of Birth/Gender: 1971-01-06 (48 y.o. M) Treating RN: Shawn Stall Primary Care Physician: Salley Slaughter Other Clinician: Referring Physician: Treating Physician/Extender:Robson, Ronita Hipps, Corliss Blacker in Treatment: 2 Education Assessment Education Provided To: Patient Education Topics Provided Nutrition: Handouts: Nutrition Methods: Explain/Verbal Responses: Reinforcements needed Electronic Signature(s) Signed: 12/06/2019 4:55:35 PM By: Shawn Stall Entered By: Shawn Stall on 12/06/2019 09:33:25 -------------------------------------------------------------------------------- Wound Assessment Details Patient Name: Date of Service: Daniel, Arroyo 12/06/2019 9:30 AM Medical Record [id number] Patient Account Number: [id number] Date of Birth/Sex: Treating RN: Nov 19, 1970 (48 y.o. [name] Primary Care Jesson Foskey: Salley Slaughter Other Clinician: Referring Jerimah Witucki: Treating Niralya Ohanian/Extender:Robson, Ronita Hipps, ANGELA Weeks in Treatment: 2 Wound Status Wound Number: 2 Primary Etiology: Trauma, Other Wound Location: Right, Medial Calcaneus Wound Status: Healed - Epithelialized Wounding Event: Gradually Appeared Comorbid History: Paraplegia Date Acquired: 06/21/2019 Weeks Of Treatment: 2 Clustered Wound: No Wound Measurements Length: (cm) 0 % Reduct Width: (cm) 0 %  Reduct Depth: (cm) 0 Epitheli Area: (cm) 0 Tunneli Volume: (cm) 0 Undermi Wound Description Classification: Full Thickness Without Exposed Support Foul Odo Structures Slough/F Wound Flat and Intact Margin: Exudate None Present Amount: Wound Bed Granulation Amount: None Present (0%) Necrotic Amount: None Present (0%) Fascia E Fat Laye Tendon E Muscle E Joint Ex Bone Exp r After Cleansing: No ibrino No Exposed Structure xposed: No r (Subcutaneous Tissue) Exposed: No xposed: No xposed: No posed: No osed: No ion in Area: 100% ion in Volume: 100% alization: Large (67-100%) ng: No ning: No Electronic Signature(s) Signed: 12/06/2019 4:55:35 PM By: Shawn Stall Entered By: Shawn Stall on 12/06/2019 09:54:40 -------------------------------------------------------------------------------- Wound Assessment Details Patient Name: Date of Service: Daniel, Arroyo 12/06/2019 9:30 AM Medical Record [id number] Patient Account Number: [id number] Date of Birth/Sex: Treating RN: 08/23/1971 (48 y.o. [name] Primary Care Darrielle Pflieger: Salley Slaughter Other Clinician: Referring Demetria Iwai: Treating Laquenta Whitsell/Extender:Robson, Ronita Hipps, ANGELA Weeks in Treatment: 2 Wound Status Wound Number: 3 Primary Etiology: Venous Leg Ulcer Wound Location: Right, Medial, Dorsal Foot Wound Status: Healed - Epithelialized Wounding Event: Gradually Appeared Comorbid History: Paraplegia Date Acquired: 06/21/2019 Weeks Of Treatment: 2 Clustered Wound: No Wound Measurements Length: (cm) 0 % Reduct Width: (cm) 0 % Reduct Depth: (cm) 0 Epitheli Area: (cm) 0 Tunneli Volume: (cm) 0 Undermi Wound Description Classification: Full  Thickness Without Exposed Support Foul Odo Structures Slough/F Wound Distinct, outline attached Margin: Exudate None Present Amount: Wound Bed Granulation Amount: None Present (0%) Necrotic Amount: None Present (0%) Fascia E Fat Laye Tendon  E Muscle E Joint Ex Bone Exp Electronic Signature(s) Signed: 12/06/2019 4:55:35 PM By: Deon Pilling Entered By: Deon Pilling on 03/18/ r After Cleansing: No ibrino No Exposed Structure xposed: No r (Subcutaneous Tissue) Exposed: No xposed: No xposed: No posed: No osed: No 2021 09:54:41 ion in Area: 100% ion in Volume: 100% alization: Large (67-100%) ng: No ning: No -------------------------------------------------------------------------------- Vitals Details Patient Name: Date of Service: Daniel, Arroyo 12/06/2019 9:30 AM Medical Record Number:[id number] Patient Account Number: [id number] Date of Birth/Sex: Treating RN: Jan 25, 1971 (49 y.o. Marvis Repress Primary Care Cabot Cromartie: Arsenio Katz Other Clinician: Referring Ilyas Lipsitz: Treating Treshun Wold/Extender:Robson, Otila Back, ANGELA Weeks in Treatment: 2 Vital Signs Time Taken: 09:40 Temperature (F): 98.3 Height (in): 72 Pulse (bpm): 69 Weight (lbs): 150 Respiratory Rate (breaths/min): 19 Body Mass Index (BMI): 20.3 Blood Pressure (mmHg): 117/60 Reference Range: 80 - 120 mg / dl Electronic Signature(s) Signed: 12/06/2019 4:36:06 PM By: Kela Millin Entered By: Kela Millin on 12/06/2019 09:41:06

## 2019-12-06 NOTE — Progress Notes (Signed)
JOBAN, COLLEDGE ([id number]) Visit Report for 12/06/2019 HPI Details Patient Name: Date of Service: Daniel Arroyo, Daniel Arroyo 12/06/2019 9:30 AM Medical Record Number:[id number] Patient Account Number: [id number] Date of Birth/Sex: Treating RN: 03-31-1971 (48 y.o. [name]: Salley Slaughter Other Clinician: Referring Provider: Treating Provider/Extender:Tikesha Mort, Ronita Hipps, Corliss Blacker in Treatment: 2 History of Present Illness HPI Description: ADMISSION 11/22/2019 This is a 49 year old man who has a history of chronic venous insufficiency and lymphedema. He has been dealing with wounds in his right medial malleolus for several months. He was actually seen by Dr. Lynden Ang at the wound care center. They were using silver alginate with his own juxta lite stockings. The patient states that the wounds fluctuated between improving and then worsening. He was seen by Dr. Myra Gianotti at [facility] in June 2020. He had arterial arterial studies that showed triphasic waveforms on the right with noncompressible ABIs at 1.39 however great toe pressure was normal at 0.84. There does not seem to provide a lot of evidence that there was an arterial component. He also had a DVT rule out study which was negative for DVT there was no evidence of superficial venous thrombosis. Dr. Myra Gianotti did not think he had an arterial pathology. He put him in 20/30 compression stockings. I am a bit surprised he did not do reflux studies. The patient states the wounds are painful he uses Tylenol and aspirin. 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 11/29/19; the patient has done nicely and compression. The area over his medial malleolus is healed. The  areas on his medial foot and dorsal ankle are just about closed. we have been using Hydrofera Blue under 3 layer compression. He has done well the areas are a lot better. Via my admission note he has juxtlite stockings. at this point I'm not sure what the history is. I'm not certain whether he had the stockings before the onset of these wounds or only obtained him afterwards 3/18; the patient's wound over his medial malleolus is closed also in the medial foot and dorsal ankle are closed. He has reflux studies next week. Obviously has changes of chronic venous insufficiency Electronic Signature(s) Signed: 12/06/2019 7:02:26 PM By: Baltazar Najjar MD Entered By: Baltazar Najjar on 12/06/2019 10:48:00 -------------------------------------------------------------------------------- Physical Exam Details Patient Name: Date of Service: Daniel Arroyo, Daniel Arroyo 12/06/2019 9:30 AM Medical Record Number:[id number] Patient Account Number: [id number] Date of Birth/Sex: Treating RN: 25-Jan-1971 (48 y.o. [name]: Salley Slaughter Other Clinician: Referring Provider: Treating Provider/Extender:Lynnea Vandervoort, Ronita Hipps, Marylene Land Weeks in Treatment: 2 Cardiovascular It will pulses are palpable. Dangers of chronic venous insufficiency but minimal edema. Notes Wound exam; his wounds are totally closed. That includes the medial malleolus the dorsal foot and ankle.. Area on the medial heel is also closed. No evidence of surrounding an Psychologist, prison and probation services) Signed: 12/06/2019 7:02:26 PM By: Baltazar Najjar MD Entered By: Baltazar Najjar on 12/06/2019 10:49:13 -------------------------------------------------------------------------------- Physician Orders Details Patient Name: Date of Service: Daniel Arroyo, Daniel Arroyo 12/06/2019 9:30 AM Medical Record Number:[id number] Patient Account Number: [id number] Date of Birth/Sex: Treating RN: 19-Feb-1971 (48 y.o. [name]:  Salley Slaughter Other Clinician: Referring Provider: Treating Provider/Extender:Lynelle Weiler, Ronita Hipps, Corliss Blacker in Treatment: 2 Verbal / Phone Orders: No Diagnosis Coding ICD-10 Coding Code Description  I87.331 Chronic venous hypertension (idiopathic) with ulcer and inflammation of right lower extremity L97.311 Non-pressure chronic ulcer of right ankle limited to breakdown of skin L97.511 Non-pressure chronic ulcer of other part of right foot limited to breakdown of skin Discharge From [facility] Discharge from W[facility] Care Center - Call if any future wound care needs. Patient to still have venous studies as scheduled next week with [facility]. Skin Barriers/Peri-Wound Care Moisturizing lotion - daily at night time both legs. Edema Control Avoid standing for long periods of time Elevate legs to the level of the heart or above for 30 minutes daily and/or when sitting, a frequency of: - throughout the day. Exercise regularly Support Garment 20-30 mm/Hg pressure to: - Apply Juxtalites to both legs in the morning and remove at night. Electronic Signature(s) Signed: 12/06/2019 4:55:35 PM By: Shawn Stall Signed: 12/06/2019 7:02:26 PM By: Baltazar Najjar MD Entered By: Shawn Stall on 12/06/2019 09:57:03 -------------------------------------------------------------------------------- Problem List Details Patient Name: Date of Service: Daniel Arroyo, Daniel Arroyo 12/06/2019 9:30 AM Medical Record Number:[id number] Patient Account Number: [id number] Date of Birth/Sex: Treating RN: [birth year] (48 y.o. [name]: Salley Slaughter Other Clinician: Referring Provider: Treating Provider/Extender:Jodell Weitman, Ronita Hipps, Corliss Blacker in Treatment: 2 Active Problems ICD-10 Evaluated Encounter Code Description Active Date Today Diagnosis I87.331 Chronic venous hypertension (idiopathic) with ulcer 11/22/2019 No Yes and inflammation of right lower extremity L97.311  Non-pressure chronic ulcer of right ankle limited to 11/22/2019 No Yes breakdown of skin L97.511 Non-pressure chronic ulcer of other part of right foot 11/22/2019 No Yes limited to breakdown of skin Inactive Problems Resolved Problems Electronic Signature(s) Signed: 12/06/2019 7:02:26 PM By: Baltazar Najjar MD Entered By: Baltazar Najjar on 12/06/2019 10:47:10 -------------------------------------------------------------------------------- Progress Note Details Patient Name: Date of Service: Daniel Arroyo, Daniel Arroyo 12/06/2019 9:30 AM Medical Record [id number] Patient Account Number: [id number] Date of Birth/Sex: Treating RN: 13-Jan-1971 (48 y.o. [name]: Salley Slaughter Other Clinician: Referring Provider: Treating Provider/Extender:Clemence Stillings, Ronita Hipps, Marylene Land Weeks in Treatment: 2 Subjective History of Present Illness (HPI) ADMISSION 11/22/2019 This is a 49 year old man who has a history of chronic venous insufficiency and lymphedema. He has been dealing with wounds in his right medial malleolus for several months. He was actually seen by Dr. Lynden Ang at the wound care center. They were using silver alginate with his own juxta lite stockings. The patient states that the wounds fluctuated between improving and then worsening. He was seen by Dr. Myra Gianotti at vein and vascular in June 2020. He had arterial arterial studies that showed triphasic waveforms on the right with noncompressible ABIs at 1.39 however great toe pressure was normal at 0.84. There does not seem to provide a lot of evidence that there was an arterial component. He also had a DVT rule out study which was negative for DVT there was no evidence of superficial venous thrombosis. Dr. Myra Gianotti did not think he had an arterial pathology. He put him in 20/30 compression stockings. I am a bit surprised he did not do reflux studies. The patient states the wounds are painful he uses Tylenol and aspirin. 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 He had a right CVA in 2018. 11/29/19; the patient has done nicely and compression. The area over his medial malleolus is healed. The areas on his medial foot  and dorsal ankle are just about closed. we have been using Hydrofera Blue under 3 layer compression. He has done well the areas are a lot better. Via my admission note he has juxtlite stockings. at this point I'm not sure what the history is. I'm not certain whether he had the stockings before the onset of these wounds or only obtained him afterwards 3/18; the patient's wound over his medial malleolus is closed also in the medial foot and dorsal ankle are closed. He has reflux studies next week. Obviously has changes of chronic venous insufficiency Objective Constitutional Vitals Time Taken: 9:40 AM, Height: 72 in, Weight: 150 lbs, BMI: 20.3, Temperature: 98.3 F, Pulse: 69 bpm, Respiratory Rate: 19 breaths/min, Blood Pressure: 117/60 mmHg. Cardiovascular It will pulses are palpable. Dangers of chronic venous insufficiency but minimal edema. General Notes: Wound exam; his wounds are totally closed. That includes the medial malleolus the dorsal foot and ankle.. Area on the medial heel is also closed. No evidence of surrounding an Integumentary (Hair, Skin) Wound #2 status is Healed - Epithelialized. Original cause of wound was Gradually Appeared. The wound is located on the Right,Medial Calcaneus. The wound measures 0cm length x 0cm width x 0cm depth; 0cm^2 area and 0cm^3 volume. There is no tunneling or undermining noted. There is a none present amount of drainage noted. The wound margin is flat and intact. There is no granulation within the wound bed. There is no necrotic  tissue within the wound bed. Wound #3 status is Healed - Epithelialized. Original cause of wound was Gradually Appeared. The wound is located on the Right,Medial,Dorsal Foot. The wound measures 0cm length x 0cm width x 0cm depth; 0cm^2 area and 0cm^3 volume. There is no tunneling or undermining noted. There is a none present amount of drainage noted. The wound margin is distinct with the outline attached to the wound base. There is no granulation within the wound bed. There is no necrotic tissue within the wound bed. Assessment Active Problems ICD-10 Chronic venous hypertension (idiopathic) with ulcer and inflammation of right lower extremity Non-pressure chronic ulcer of right ankle limited to breakdown of skin Non-pressure chronic ulcer of other part of right foot limited to breakdown of skin Plan Discharge From [facility]: Discharge from [facility] - Call if any future wound care needs. Patient to still have venous studies as scheduled next week with [facility]. Skin Barriers/Peri-Wound Care: Moisturizing lotion - daily at night time both legs. Edema Control: Avoid standing for long periods of time Elevate legs to the level of the heart or above for 30 minutes daily and/or when sitting, a frequency of: - throughout the day. Exercise regularly Support Garment 20-30 mm/Hg pressure to: - Apply Juxtalites to both legs in the morning and remove at night. 1. The patient can be discharged from the clinic 2. He has bilateral juxta lite stockings to control swelling and protect his skin 3. Cautioned to lotion the skin in his bilateral lower extremities. 4. He has reflux studies next week. If they come to my inbox I will try to communicate with his primary doctor in [place] Signature(s) Signed: 12/06/2019 7:02:26 PM By: Linton Ham MD Entered By: Linton Ham on 12/06/2019  10:50:16 -------------------------------------------------------------------------------- SuperBill Details Patient Name: Date of Service: Daniel Arroyo, Daniel Arroyo 12/06/2019 Medical Record Number:[id number] Patient Account Number: [id number] Date of Birth/Sex: Treating RN: 1970-09-25 (49 y.o. [name] Primary Care Provider: Arsenio Katz Other Clinician: Referring Provider: Treating Provider/Extender:Ivelis Norgard, Otila Back, ANGELA Weeks in  Treatment: 2 Diagnosis Coding ICD-10 Codes Code Description I87.331 Chronic venous hypertension (idiopathic) with ulcer and inflammation of right lower extremity L97.311 Non-pressure chronic ulcer of right ankle limited to breakdown of skin L97.511 Non-pressure chronic ulcer of other part of right foot limited to breakdown of skin Facility Procedures The patient participates with Medicare or their insurance follows the Medicare Facility Guidelines: CPT4 Code Description Modifier Quantity [id number] - WOUND CARE VISIT-LEV 4 EST PT 1 Physician Procedures CPT4 Code Description: [id number] - WC PHYS LEVEL 3 - EST PT ICD-10 Diagnosis Description I87.331 Chronic venous hypertension (idiopathic) with ulcer and in lower extremity L97.311 Non-pressure chronic ulcer of right ankle limited to break L97.511  Non-pressure chronic ulcer of other part of right foot lim Modifier: flammation of ri down of skin ited to breakdow Quantity: 1 ght n of skin Electronic Signature(s) Signed: 12/06/2019 7:02:26 PM By: Baltazar Najjar MD Entered By: Baltazar Najjar on 12/06/2019 10:50:33

## 2019-12-07 ENCOUNTER — Encounter (HOSPITAL_COMMUNITY): Payer: PPO

## 2019-12-12 ENCOUNTER — Other Ambulatory Visit: Payer: Self-pay

## 2019-12-12 ENCOUNTER — Encounter: Payer: Self-pay | Admitting: Neurology

## 2019-12-12 ENCOUNTER — Ambulatory Visit (INDEPENDENT_AMBULATORY_CARE_PROVIDER_SITE_OTHER): Payer: PPO | Admitting: Neurology

## 2019-12-12 VITALS — BP 141/81 | HR 69 | Ht 70.0 in | Wt 141.8 lb

## 2019-12-12 DIAGNOSIS — Z8673 Personal history of transient ischemic attack (TIA), and cerebral infarction without residual deficits: Secondary | ICD-10-CM | POA: Diagnosis not present

## 2019-12-12 DIAGNOSIS — G40009 Localization-related (focal) (partial) idiopathic epilepsy and epileptic syndromes with seizures of localized onset, not intractable, without status epilepticus: Secondary | ICD-10-CM | POA: Diagnosis not present

## 2019-12-12 NOTE — Progress Notes (Signed)
NEUROLOGY FOLLOW UP OFFICE NOTE  Daniel Arroyo [id number] 01/07/1971  HISTORY OF PRESENT ILLNESS: I had the pleasure of seeing Daniel Arroyo in follow-up in the neurology clinic on 12/12/2019. The patient was last seen 4 months ago for seizures secondary to right MCA stroke. 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 No loss of consciousness since 05/2019. Depakote dose increased to 250mg  in AM, 500mg  in PM. He is happy to report that he has not had any more seizures since last visit. He gets a little tremor if he gets aggravated, but nothing more than that. No side effects on Depakote. He denies any headaches, dizziness, vision changes, no falls. He has started to get Botox with Dr. . He denies any falls, sleep is okay. Mood is okay, he will be glad to get out of the house soon.   His 1-hour wake and asleep EEG done 08/2019 was abnormal due to occasional focal slowing over the right hemisphere.  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 Per notes, stroke was cryptogenic. He did endorse cocaine use around that time. He started having focal seizures in September 2019 where he describes left arm clonic activity that he cannot control. He has clonus but states the clonic activity is different. Face and leg are not affected. They last around 5 minutes, no clear worsened weakness or worsened numbness after.  He notices it more when he gets frustrated, stressed, or is struggling/straining with something. He usually takes a deep breath and the clonic activity would calm down. This was going on intermittently for a year until  September 2020 while he was home alone, he started having the left arm clonic activity but this time he lost consciousness. He apparently grabbed his TV and the TV fell on top of him, fracturing his left arm. He woke up on the ground, no tongue bite or incontinence, and was brought to [facility]. He was told there was no new stroke. His PCP started him on low dose Depakote which initially quieted down the clonic activity, then again recurred when he would get frustrated. He had a Depakote level which was subtherapeutic, and dose was increased to 250mg  BID a couple of weeks ago. He denies any side effects. He denies any other episodes of gaps in time, olfactory/gustatory hallucinations. His father denies any staring/unresponsive episodes. He lives independently with family close by. He states his sleep is horrible and has not noticed any relation of seizures to poor sleep. He denies any alcohol intake in over a year, no illicit drug use since the stroke. He has been doing PT for his left arm fracture and notices some pain when his left arm is extended, but there is typically no pain. He is concerned that up until May/June 2020, he was more independent, walking daily to [place], but over the past few months his stamina and whole physical ability have decreased rapidly. He wonders about Botox helping with spasticity. He has also been dealing with bilateral lymphedema in his legs since late Spring/early Summer, with a wound on his right foot. He denies any headaches, dizziness, diplopia, dysphagia, back pain. He has neck pain and difficulty holding  his head up. He has constipation due to Suboxone.   Epilepsy Risk Factors:  History of right MCA stroke. Otherwise he had a normal birth and early development.  There is no history of febrile convulsions, CNS infections such as meningitis/encephalitis, significant traumatic brain injury, neurosurgical procedures, or family history of seizures.  PAST MEDICAL HISTORY: Past  Medical History:  Diagnosis Date  . ADHD, adult residual type   . Anxiety and depression   . Bipolar disorder ([place])   . Depression   . Gastroesophageal reflux disease   . History of stroke 2018   Reportedly in the setting of substance abuse  . Knee pain   . Polysubstance abuse ([place])   . Stroke [facility])     MEDICATIONS: Current Outpatient Medications on File Prior to Visit  Medication Sig Dispense Refill  . ADDERALL XR 20 MG 24 hr capsule Take 20 mg by mouth daily.     . divalproex (DEPAKOTE) 250 MG DR tablet Take 1 tab in AM, 2 tabs in PM 90 tablet 11  . furosemide (LASIX) 20 MG tablet TAKE 1 TABLET BY MOUTH DAILY 90 tablet 0  . mirtazapine (REMERON) 15 MG tablet Take 1 tablet (15 mg total) by mouth at bedtime. 30 tablet 0  . naproxen (NAPROSYN) 500 MG tablet Take 500 mg by mouth as needed.     Marland Kitchen omeprazole (PRILOSEC) 20 MG capsule Take 20 mg by mouth daily.    . potassium chloride (K-DUR) 10 MEQ tablet Take 1 tablet (10 mEq total) by mouth daily. 90 tablet 1   No current facility-administered medications on file prior to visit.    ALLERGIES: No Known Allergies  FAMILY HISTORY: Family History  Problem Relation Age of Onset  . Heart attack Mother        Died in early 53's  . HIV Brother        Died in early 38's    SOCIAL HISTORY: Social History   Socioeconomic History  . Marital status: Divorced    Spouse name: Not on file  . Number of children: Not on file  . Years of education: Not on file  . Highest education level: Not on file  Occupational History  . Not on file  Tobacco Use  . Smoking status: Current Every Day Smoker    Packs/day: 0.50    Types: Cigarettes  . Smokeless tobacco: Never Used  Substance and Sexual Activity  . Alcohol use: Yes    Comment: in rehab   . Drug use: Yes    Types: Heroin, Marijuana    Comment: in rehab  . Sexual activity: Not on file  Other Topics Concern  . Not on file  Social History Narrative   Right handed      Lives  with father   Social Determinants of Health   Financial Resource Strain:   . Difficulty of Paying Living Expenses:   Food Insecurity:   . Worried About Charity fundraiser in the Last Year:   . Arboriculturist in the Last Year:   Transportation Needs:   . Film/video editor (Medical):   Marland Kitchen Lack of Transportation (Non-Medical):   Physical Activity:   . Days of Exercise per Week:   . Minutes of Exercise per Session:   Stress:   . Feeling of Stress :   Social Connections:   . Frequency of Communication with Friends and Family:   . Frequency of Social Gatherings with Friends and Family:   .  Attends Religious Services:   . Active Member of Clubs or Organizations:   . Attends Banker Meetings:   Marland Kitchen Marital Status:   Intimate Partner Violence:   . Fear of Current or Ex-Partner:   . Emotionally Abused:   Marland Kitchen Physically Abused:   . Sexually Abused:     REVIEW OF SYSTEMS: Constitutional: No fevers, chills, or sweats, no generalized fatigue, change in appetite Eyes: No visual changes, double vision, eye pain Ear, nose and throat: No hearing loss, ear pain, nasal congestion, sore throat Cardiovascular: No chest pain, palpitations Respiratory:  No shortness of breath at rest or with exertion, wheezes GastrointestinaI: No nausea, vomiting, diarrhea, abdominal pain, fecal incontinence Genitourinary:  No dysuria, urinary retention or frequency Musculoskeletal:  No neck pain, back pain Integumentary: No rash, pruritus, skin lesions Neurological: as above Psychiatric: No depression, insomnia, anxiety Endocrine: No palpitations, fatigue, diaphoresis, mood swings, change in appetite, change in weight, increased thirst Hematologic/Lymphatic:  No anemia, purpura, petechiae. Allergic/Immunologic: no itchy/runny eyes, nasal congestion, recent allergic reactions, rashes  PHYSICAL EXAM: Vitals:   12/12/19 1603  BP: (!) 141/81  Pulse: 69  SpO2: 95%   General: No acute  distress, cervical dystonia with head turned/tilted to the right Head:  Normocephalic/atraumatic Skin/Extremities: No rash Neurological Exam: alert and oriented to person, place, and time. No aphasia or dysarthria. Fund of knowledge is appropriate.  Recent and remote memory are intact.  Attention and concentration are normal. Cranial nerves: Pupils equal, round, reactive to light. Extraocular movements intact with no nystagmus. Visual fields full.No facial asymmetry. Motor: Increased tone on left UE and LE with spastic contracture at left elbow and wrist. 0/5 left wrist and finger extension.  Finger to nose testing intact on right. 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 No further loss of consciousness since 05/2019. The focal motor seizures involving left arm have quieted down with increase in Depakote to 250mg  in AM, 500mg  in PM. Continue follow-up with Dr. for Botox. He does not drive. Follow-up in 6 months, he knows to call for any changes.   Thank you for allowing me to participate in his care.  Please do not hesitate to call for any questions or concerns.   , M.D.   CC: Wynn Banker, NP, Dr. Patrcia Dolly

## 2019-12-12 NOTE — Patient Instructions (Signed)
Great seeing you! Continue Depakote 250mg : take 1 tab in AM, 2 tabs in PM. Continue follow-up with Dr. for Botox. Follow-up in 6 months, call for any changes.  Seizure Precautions: 1. If medication has been prescribed for you to prevent seizures, take it exactly as directed.  Do not stop taking the medicine without talking to your doctor first, even if you have not had a seizure in a long time.   2. Avoid activities in which a seizure would cause danger to yourself or to others.  Don't operate dangerous machinery, swim alone, or climb in high or dangerous places, such as on ladders, roofs, or girders.  Do not drive unless your doctor says you may.  3. If you have any warning that you may have a seizure, lay down in a safe place where you can't hurt yourself.    4.  No driving for 6 months from last seizure, as per [facility].   Please refer to the following link on the [employer]'s website for more information: http://www.epilepsyfoundation.org/answerplace/Social/driving/drivingu.cfm   5.  Maintain good sleep hygiene. Avoid alcohol.  6.  Contact your doctor if you have any problems that may be related to the medicine you are taking.  7.  Call 911 and bring the patient back to the ED if:        A.  The seizure lasts longer than 5 minutes.       B.  The patient doesn't awaken shortly after the seizure  C.  The patient has new problems such as difficulty seeing, speaking or moving  D.  The patient was injured during the seizure  E.  The patient has a temperature over 102 F (39C)  F.  The patient vomited and now is having trouble breathing

## 2019-12-13 ENCOUNTER — Ambulatory Visit (HOSPITAL_COMMUNITY)
Admission: RE | Admit: 2019-12-13 | Discharge: 2019-12-13 | Disposition: A | Discharge status: Home / Self Care | Payer: PPO | Source: Ambulatory Visit | Attending: Vascular Surgery | Admitting: Vascular Surgery

## 2019-12-13 ENCOUNTER — Other Ambulatory Visit (HOSPITAL_COMMUNITY): Payer: Self-pay | Admitting: Internal Medicine

## 2019-12-13 DIAGNOSIS — L97919 Non-pressure chronic ulcer of unspecified part of right lower leg with unspecified severity: Secondary | ICD-10-CM

## 2019-12-13 DIAGNOSIS — I87331 Chronic venous hypertension (idiopathic) with ulcer and inflammation of right lower extremity: Secondary | ICD-10-CM

## 2019-12-27 ENCOUNTER — Ambulatory Visit: Payer: PPO | Admitting: Physical Medicine & Rehabilitation

## 2019-12-28 DIAGNOSIS — R062 Wheezing: Secondary | ICD-10-CM | POA: Diagnosis not present

## 2019-12-28 DIAGNOSIS — Z299 Encounter for prophylactic measures, unspecified: Secondary | ICD-10-CM | POA: Diagnosis not present

## 2019-12-28 DIAGNOSIS — J302 Other seasonal allergic rhinitis: Secondary | ICD-10-CM | POA: Diagnosis not present

## 2019-12-28 DIAGNOSIS — F909 Attention-deficit hyperactivity disorder, unspecified type: Secondary | ICD-10-CM | POA: Diagnosis not present

## 2019-12-28 DIAGNOSIS — I10 Essential (primary) hypertension: Secondary | ICD-10-CM | POA: Diagnosis not present

## 2020-01-02 NOTE — Progress Notes (Signed)
Daniel Arroyo, Daniel Arroyo ([id number]) Visit Report for 11/29/2019 Arrival Information Details Patient Name: Date of Service: Daniel Arroyo, Daniel Arroyo 11/29/2019 2:30 PM Medical Record Number:[id number] Patient Account Number: [id number] Date of Birth/Sex: Treating RN: 1971/06/26 (49 y.o. Janyth Contes Primary Care Treylen Gibbs: Arsenio Katz Other Clinician: Referring Annalyssa Thune: Treating Rubina Basinski/Extender:Robson, Otila Back, Valinda Party in Treatment: 1 Visit Information History Since Last Visit Added or deleted any medications: No Patient Arrived: Cane Any new allergies or adverse reactions: No Arrival Time: 14:22 Had a fall or experienced change in No Accompanied By: father activities of daily living that may affect Transfer Assistance: None risk of falls: Patient Identification Verified: Yes Signs or symptoms of abuse/neglect since last No Secondary Verification Process Yes visito Completed: Hospitalized since last visit: No Patient Requires Transmission- No Implantable device outside of the clinic excluding No Based Precautions: cellular tissue based products placed in the center Patient Has Alerts: Yes since last visit: Patient Alerts: 02/2019 ABI: [place] Has Dressing in Place as Prescribed: Yes 02/2019 TBI:L0.85, Has Compression in Place as Prescribed: Yes R0.84 Pain Present Now: Yes Electronic Signature(s) Signed: 12/03/2019 5:44:18 PM By: Levan Hurst RN, BSN Entered By: Levan Hurst on 11/29/2019 14:22:47 -------------------------------------------------------------------------------- Compression Therapy Details Patient Name: Date of Service: Daniel Arroyo, Daniel Arroyo 11/29/2019 2:30 PM Medical Record [id number] Patient Account Number: [id number] Date of Birth/Sex: Treating RN: 03/23/1971 (49 y.o. [name] Primary Care Jericca Russett: Arsenio Katz Other Clinician: Referring Jorryn Casagrande: Treating Nakota Elsen/Extender:Robson, Otila Back, Levada Dy Weeks in Treatment:  1 Compression Therapy Performed for Wound Wound #2 Right,Medial Calcaneus Assessment: Performed By: Clinician Baruch Gouty, RN Compression Type: Three Layer Post Procedure Diagnosis Same as Pre-procedure Electronic Signature(s) Signed: 11/29/2019 6:00:33 PM By: Deon Pilling Entered By: Deon Pilling on 11/29/2019 14:50:13 -------------------------------------------------------------------------------- Compression Therapy Details Patient Name: Date of Service: Daniel Arroyo, Daniel Arroyo 11/29/2019 2:30 PM Medical Record Number:[id number] Patient Account Number: [id number] Date of Birth/Sex: Treating RN: 12-31-1970 (49 y.o. [name] Primary Care Khoi Hamberger: Arsenio Katz Other Clinician: Referring Almyra Birman: Treating Yash Cacciola/Extender:Robson, Otila Back, Levada Dy Weeks in Treatment: 1 Compression Therapy Performed for Wound Wound #3 Right,Medial,Dorsal Foot Assessment: Performed By: Clinician Baruch Gouty, RN Compression Type: Three Layer Post Procedure Diagnosis Same as Pre-procedure Electronic Signature(s) Signed: 11/29/2019 6:00:33 PM By: Deon Pilling Entered By: Deon Pilling on 11/29/2019 14:50:15 -------------------------------------------------------------------------------- Encounter Discharge Information Details Patient Name: Date of Service: Daniel Arroyo, Daniel Arroyo 11/29/2019 2:30 PM Medical Record [id number] Patient Account Number: [id number] Date of Birth/Sex: Treating RN: 11/28/1970 (49 y.o. Ernestene Mention Primary Care Takeshia Wenk: Arsenio Katz Other Clinician: Referring Blossom Crume: Treating Markale Birdsell/Extender:Robson, Otila Back, Valinda Party in Treatment: 1 Encounter Discharge Information Items Discharge Condition: Stable Ambulatory Status: Cane Discharge Destination: Home Transportation: Private Auto Accompanied By: father Schedule Follow-up Appointment: Yes Clinical Summary of Care: Patient Declined Electronic Signature(s) Signed: 11/29/2019 5:58:47  PM By: Baruch Gouty RN, BSN Entered By: Baruch Gouty on 11/29/2019 15:15:34 -------------------------------------------------------------------------------- Lower Extremity Assessment Details Patient Name: Date of Service: Daniel Arroyo, Daniel Arroyo 11/29/2019 2:30 PM Medical Record Number:[id number] Patient Account Number: [id number] Date of Birth/Sex: Treating RN: 04-20-1971 (49 y.o. [name] Primary Care Harleen Fineberg: Arsenio Katz Other Clinician: Referring Dyann Goodspeed: Treating Finnley Larusso/Extender:Robson, Otila Back, ANGELA Weeks in Treatment: 1 Edema Assessment Assessed: [Left: No] [Right: No] Edema: [Left: N] [Right: o] Calf Left: Right: Point of Measurement: 45 cm From Medial Instep cm 32.7 cm Ankle Left: Right: Point of Measurement: 10 cm From Medial Instep cm 21.5 cm Vascular Assessment Pulses: Dorsalis Pedis Palpable: [Right:Yes] Electronic Signature(s) Signed: 11/29/2019 6:00:33 PM By: Deon Pilling Signed: 01/02/2020 9:21:08 AM By: Sandre Kitty Entered  By: Sandre Kitty on 11/29/2019 14:35:25 -------------------------------------------------------------------------------- Multi Wound Chart Details Patient Name: Date of Service: Daniel Arroyo, Daniel Arroyo 11/29/2019 2:30 PM Medical Record Number:[id number] Patient Account Number: [id number] Date of Birth/Sex: Treating RN: [birth year] (49 y.o. [name] Primary Care Paizleigh Wilds: Arsenio Katz Other Clinician: Referring Ovida Delagarza: Treating Kitrina Maurin/Extender:Robson, Otila Back, ANGELA Weeks in Treatment: 1 Vital Signs Height(in): 72 Pulse(bpm): 90 Weight(lbs): 150 Blood Pressure(mmHg): 136/92 Body Mass Index(BMI): 20 Temperature(F): 98.4 Respiratory 18 Rate(breaths/min): Photos: [1:No Photos] [2:No Photos] [3:No Photos] Wound Location: [1:Right Malleolus] [2:Right Calcaneus - Medial] [3:Right, Medial, Dorsal Foot] Wounding Event: [1:Gradually Appeared] [2:Gradually Appeared] [3:Gradually Appeared] Primary  Etiology: [1:Venous Leg Ulcer] [2:Trauma, Other] [3:Venous Leg Ulcer] Comorbid History: [1:Paraplegia] [2:Paraplegia] [3:N/A] Date Acquired: [1:06/21/2019] [2:06/21/2019] [3:06/21/2019] Weeks of Treatment: [1:1] [2:1] [3:1] Wound Status: [1:Healed - Epithelialized] [2:Open] [3:Open] Measurements L x W x D [1:0x0x0] [2:0.2x0.2x0.1] [3:0.3x0.3x0.1] (cm) Area (cm) : [1:0] [2:0.031] [3:0.071] Volume (cm) : [1:0] [2:0.003] [3:0.007] % Reduction in Area: [1:100.00%] [2:99.50%] [3:97.10%] % Reduction in Volume: [1:100.00%] [2:99.50%] [3:97.20%] Classification: [1:Full Thickness Without Exposed Support Structures Exposed Support Structures Exposed Support Structures] [2:Full Thickness Without] [3:Full Thickness Without] Exudate Amount: [1:Small] [2:Small] [3:N/A] Exudate Type: [1:Serosanguineous] [2:Serosanguineous] [3:N/A] Exudate Color: [1:red, brown] [2:red, brown] [3:N/A] Wound Margin: [1:Flat and Intact] [2:Flat and Intact] [3:N/A] Granulation Amount: [1:Large (67-100%)] [2:Large (67-100%)] [3:N/A] Granulation Quality: [1:Pink] [2:Pink] [3:N/A] Necrotic Amount: [1:Small (1-33%)] [2:Small (1-33%)] [3:N/A] Exposed Structures: [1:Fat Layer (Subcutaneous Fascia: No Tissue) Exposed: Yes Fascia: No Tendon: No Muscle: No Joint: No Bone: No] [2:Fat Layer (Subcutaneous Tissue) Exposed: No Tendon: No Muscle: No Joint: No Bone: No] [3:N/A] Epithelialization: [1:Large (67-100%) N/A] [2:None Compression Therapy] [3:N/A Compression Therapy] Treatment Notes Wound #2 (Right, Medial Calcaneus) 2. Periwound Care Moisturizing lotion TCA Cream 3. Primary Dressing Applied Hydrofera Blue 4. Secondary Dressing Dry Gauze 6. Support Layer Applied 3 layer compression wrap Wound #3 (Right, Medial, Dorsal Foot) 2. Periwound Care Moisturizing lotion TCA Cream 3. Primary Dressing Applied Hydrofera Blue 4. Secondary Dressing Dry Gauze 6. Support Layer Applied 3 layer compression Orthoptist) Signed: 12/01/2019 7:03:15 AM By: Linton Ham MD Signed: 12/03/2019 5:25:09 PM By: Deon Pilling Entered By: Linton Ham on 12/01/2019 06:06:16 -------------------------------------------------------------------------------- Multi-Disciplinary Care Plan Details Patient Name: Date of Service: Daniel Arroyo, Daniel Arroyo 11/29/2019 2:30 PM Medical Record [id number] Patient Account Number: [id number] Date of Birth/Sex: Treating RN: 1971-04-26 (49 y.o. [name] Primary Care Dionicia Cerritos: Arsenio Katz Other Clinician: Referring Kiyani Jernigan: Treating Vernessa Likes/Extender:Robson, Otila Back, Valinda Party in Treatment: 1 Active Inactive Nutrition Nursing Diagnoses: Potential for alteratiion in Nutrition/Potential for imbalanced nutrition Goals: Patient/caregiver agrees to and verbalizes understanding of need to obtain nutritional consultation Date Initiated: 11/22/2019 Target Resolution Date: 12/28/2019 Goal Status: Active Interventions: Provide education on nutrition Treatment Activities: Patient referred to Primary Care Physician for further nutritional evaluation : 11/22/2019 Notes: Pain, Acute or Chronic Nursing Diagnoses: Pain, acute or chronic: actual or potential Potential alteration in comfort, pain Goals: Patient will verbalize adequate pain control and receive pain control interventions during procedures as needed Date Initiated: 11/22/2019 Target Resolution Date: 12/27/2019 Goal Status: Active Patient/caregiver will verbalize comfort Arroyo met Date Initiated: 11/22/2019 Target Resolution Date: 12/28/2019 Goal Status: Active Interventions: Encourage patient to take pain medications as prescribed Provide education on pain management Reposition patient for comfort Treatment Activities: Administer pain control measures as ordered : 11/22/2019 Notes: Venous Leg Ulcer Nursing Diagnoses: Potential for venous Insuffiency (use before diagnosis confirmed) Goals: Patient  will maintain optimal edema control Date Initiated: 11/22/2019 Target Resolution Date: 12/28/2019 Goal Status: Active  Interventions: Provide education on venous insufficiency Notes: Electronic Signature(s) Signed: 11/29/2019 6:00:33 PM By: Deon Pilling Entered By: Deon Pilling on 11/29/2019 14:46:56 -------------------------------------------------------------------------------- Pain Assessment Details Patient Name: Date of Service: Daniel Arroyo, Daniel Arroyo 11/29/2019 2:30 PM Medical Record Number:[id number] Patient Account Number: [id number] Date of Birth/Sex: Treating RN: [birth year] (49 y.o. Janyth Contes Primary Care Yandell Mcjunkins: Arsenio Katz Other Clinician: Referring Elinore Shults: Treating Donta Mcinroy/Extender:Robson, Otila Back, Levada Dy Weeks in Treatment: 1 Active Problems Location of Pain Severity and Description of Pain Patient Has Paino Yes Site Locations Pain Location: Pain in Ulcers With Dressing Change: Yes Duration of the Pain. Constant / Intermittento Intermittent Rate the pain. Current Pain Arroyo: 3 Character of Pain Describe the Pain: Dull Pain Management and Medication Current Pain Management: Medication: Yes Cold Application: No Rest: No Massage: No Activity: No T.E.N.S.: No Heat Application: No Leg drop or elevation: No Is the Current Pain Management Adequate: Adequate How does your wound impact your activities of daily livingo Sleep: No Bathing: No Appetite: No Relationship With Others: No Bladder Continence: No Emotions: No Bowel Continence: No Work: No Toileting: No Drive: No Dressing: No Hobbies: No Electronic Signature(s) Signed: 12/03/2019 5:44:18 PM By: Levan Hurst RN, BSN Entered By: Levan Hurst on 11/29/2019 14:23:06 -------------------------------------------------------------------------------- Patient/Caregiver Education Details Patient Name: Date of Service: Daniel Arroyo 3/11/2021andnbsp2:30 PM Medical Record  [id number] Patient Account Number: [id number] Date of Birth/Gender: 1971-02-13 (49 y.o. M) Treating RN: Deon Pilling Primary Care Physician: Arsenio Katz Other Clinician: Referring Physician: Treating Physician/Extender:Robson, Otila Back, Valinda Party in Treatment: 1 Education Assessment Education Provided To: Patient Education Topics Provided Venous: Handouts: Controlling Swelling with Compression Stockings Methods: Explain/Verbal, Printed Responses: Reinforcements needed Electronic Signature(s) Signed: 11/29/2019 6:00:33 PM By: Deon Pilling Entered By: Deon Pilling on 11/29/2019 14:48:20 -------------------------------------------------------------------------------- Wound Assessment Details Patient Name: Date of Service: Daniel Arroyo, Daniel Arroyo 11/29/2019 2:30 PM Medical Record [id number] Patient Account Number: [id number] Date of Birth/Sex: Treating RN: [birth year] (49 y.o. [name] Primary Care Shanikia Kernodle: Arsenio Katz Other Clinician: Referring Quran Vasco: Treating Ozro Russett/Extender:Robson, Otila Back, ANGELA Weeks in Treatment: 1 Wound Status Wound Number: 1 Primary Etiology: Venous Leg Ulcer Wound Location: Right Malleolus Wound Status: Healed - Epithelialized Wounding Event: Gradually Appeared Comorbid History: Paraplegia Date Acquired: 06/21/2019 Weeks Of Treatment: 1 Clustered Wound: No Photos Wound Measurements Length: (cm) 0 % Reduct Width: (cm) 0 % Reduct Depth: (cm) 0 Epitheli Area: (cm) 0 Tunneli Volume: (cm) 0 Undermi Wound Description Classification: Full Thickness Without Exposed Support Foul Odo Structures Slough/F Wound Flat and Intact Margin: Exudate Small Amount: Exudate Serosanguineous Type: Exudate red, brown Color: Wound Bed Granulation Amount: Large (67-100%) Granulation Quality: Pink Fascia Ex Necrotic Amount: Small (1-33%) Fat Layer Necrotic Quality: Adherent Slough Tendon Ex Muscle Ex Joint Exp Bone  Expo r After Cleansing: No ibrino Yes Exposed Structure posed: No (Subcutaneous Tissue) Exposed: Yes posed: No posed: No osed: No sed: No ion in Area: 100% ion in Volume: 100% alization: Large (67-100%) ng: No ning: No Electronic Signature(s) Signed: 12/03/2019 5:11:29 PM By: Mikeal Hawthorne EMT/HBOT Signed: 12/03/2019 5:25:09 PM By: Deon Pilling Previous Signature: 11/29/2019 6:00:33 PM Version By: Deon Pilling Entered By: Mikeal Hawthorne on 12/03/2019 14:42:26 -------------------------------------------------------------------------------- Wound Assessment Details Patient Name: Date of Service: Daniel Arroyo, Daniel Arroyo 11/29/2019 2:30 PM Medical Record [id number] Patient Account Number: [id number] Date of Birth/Sex: Treating RN: Feb 14, 1971 (49 y.o. [name] Primary Care Layce Sprung: Arsenio Katz Other Clinician: Referring Bandon Sherwin: Treating Monika Chestang/Extender:Robson, Otila Back, ANGELA Weeks in Treatment: 1 Wound Status Wound Number: 2 Primary Etiology: Trauma, Other Wound Location: Right Calcaneus -  Medial Wound Status: Open Wounding Event: Gradually Appeared Comorbid History: Paraplegia Date Acquired: 06/21/2019 Weeks Of Treatment: 1 Clustered Wound: No Photos Wound Measurements Length: (cm) 0.2 % Reducti Width: (cm) 0.2 % Reducti Depth: (cm) 0.1 Epithelia Area: (cm) 0.031 Tunneli Volume: (cm) 0.003 Undermi Wound Description Full Thickness Without Exposed Support Foul Odo Classification: Structures Slough/F Wound Flat and Intact Margin: Exudate Small Amount: Exudate Serosanguineous Type: Exudate red, brown Color: Wound Bed Granulation Amount: Large (67-100%) Granulation Quality: Pink Fascia E Necrotic Amount: Small (1-33%) Fat Laye Necrotic Quality: Adherent Slough Tendon E Muscle E Joint Ex Bone Exp r After Cleansing: No ibrino Yes Exposed Structure xposed: No r (Subcutaneous Tissue) Exposed: No xposed: No xposed: No posed:  No osed: No on in Area: 99.5% on in Volume: 99.5% lization: None ng: No ning: No Electronic Signature(s) Signed: 12/03/2019 5:11:29 PM By: Mikeal Hawthorne EMT/HBOT Signed: 12/03/2019 5:25:09 PM By: Deon Pilling Previous Signature: 11/29/2019 6:00:33 PM Version By: Deon Pilling Entered By: Mikeal Hawthorne on 12/03/2019 14:42:01 -------------------------------------------------------------------------------- Wound Assessment Details Patient Name: Date of Service: Daniel Arroyo, Daniel Arroyo 11/29/2019 2:30 PM Medical Record [id number] Patient Account Number: [id number] Date of Birth/Sex: Treating RN: [birth year] (49 y.o. [name] Primary Care Sudiksha Victor: Arsenio Katz Other Clinician: Referring Iysis Germain: Treating Sadik Piascik/Extender:Robson, Otila Back, ANGELA Weeks in Treatment: 1 Wound Status Wound Number: 3 Primary Etiology: Venous Leg Ulcer Wound Location: Right Foot - Dorsal, Medial Wound Status: Open Wounding Event: Gradually Appeared Comorbid History: Paraplegia Date Acquired: 06/21/2019 Weeks Of Treatment: 1 Clustered Wound: No Photos Wound Measurements Length: (cm) 0.3 % Reduct Width: (cm) 0.3 % Reduct Depth: (cm) 0.1 Epitheli Area: (cm) 0.071 Volume: (cm) 0.007 Wound Description Classification: Full Thickness Without Exposed Support Foul Odo Structures Slough/F Exudate Medium Amount: Exudate Serosanguineous Type: Exudate red, brown Color: Wound Bed Granulation Amount: Small (1-33%) Granulation Quality: Pink Fascia E Necrotic Amount: Large (67-100%) Fat Laye Necrotic Quality: Adherent Slough Tendon E Muscle E Joint Ex Bone Exp r After Cleansing: No ibrino Yes Exposed Structure xposed: No r (Subcutaneous Tissue) Exposed: Yes xposed: No xposed: No posed: No osed: No ion in Area: 97.1% ion in Volume: 97.2% alization: None Electronic Signature(s) Signed: 12/03/2019 5:11:29 PM By: Mikeal Hawthorne EMT/HBOT Signed: 12/03/2019 5:25:09 PM By: Deon Pilling Previous Signature: 11/29/2019 6:00:33 PM Version By: Deon Pilling Entered By: Mikeal Hawthorne on 12/03/2019 14:41:33 -------------------------------------------------------------------------------- Vitals Details Patient Name: Date of Service: EREK, KOWAL 11/29/2019 2:30 PM Medical Record [id number] Patient Account Number: [id number] Date of Birth/Sex: Treating RN: 17-Jun-1971 (49 y.o. [name] Primary Care Daequan Kozma: Arsenio Katz Other Clinician: Referring Dannie Woolen: Treating Tesia Lybrand/Extender:Robson, Otila Back, ANGELA Weeks in Treatment: 1 Vital Signs Time Taken: 14:24 Temperature (F): 98.4 Height (in): 72 Pulse (bpm): 90 Weight (lbs): 150 Respiratory Rate (breaths/min): 18 Body Mass Index (BMI): 20.3 Blood Pressure (mmHg): 136/92 Reference Range: 80 - 120 mg / dl Electronic Signature(s) Signed: 01/02/2020 9:21:08 AM By: Sandre Kitty Entered By: Sandre Kitty on 11/29/2019 14:24:25

## 2020-01-28 DIAGNOSIS — I10 Essential (primary) hypertension: Secondary | ICD-10-CM | POA: Diagnosis not present

## 2020-01-28 DIAGNOSIS — Z299 Encounter for prophylactic measures, unspecified: Secondary | ICD-10-CM | POA: Diagnosis not present

## 2020-01-28 DIAGNOSIS — G4700 Insomnia, unspecified: Secondary | ICD-10-CM | POA: Diagnosis not present

## 2020-01-28 DIAGNOSIS — F909 Attention-deficit hyperactivity disorder, unspecified type: Secondary | ICD-10-CM | POA: Diagnosis not present

## 2020-01-28 DIAGNOSIS — R569 Unspecified convulsions: Secondary | ICD-10-CM | POA: Diagnosis not present

## 2020-01-29 ENCOUNTER — Encounter: Payer: Self-pay | Admitting: Physical Medicine & Rehabilitation

## 2020-01-29 ENCOUNTER — Other Ambulatory Visit: Payer: Self-pay

## 2020-01-29 ENCOUNTER — Encounter: Payer: PPO | Attending: Physical Medicine & Rehabilitation | Admitting: Physical Medicine & Rehabilitation

## 2020-01-29 VITALS — BP 146/91 | HR 104 | Temp 97.7°F | Wt 144.0 lb

## 2020-01-29 DIAGNOSIS — G8110 Spastic hemiplegia affecting unspecified side: Secondary | ICD-10-CM | POA: Insufficient documentation

## 2020-01-29 DIAGNOSIS — G243 Spasmodic torticollis: Secondary | ICD-10-CM

## 2020-01-29 NOTE — Progress Notes (Signed)
Botox Injection for spasticity using needle EMG guidance G 81.10 [id number]  Dilution: 50 units/ml Indication: Severe spasticity which interferes with ADL,mobility and/or  hygiene and is unresponsive to medication management and other conservative care Informed consent was obtained after describing risks and benefits of the procedure with the patient. This includes bleeding, bruising, infection, excessive weakness, or medication side effects. A REMS form is on file and signed. Needle: 27-gauge 1 inch needle electrode Number of units per muscle Left side FCU 50 units Brachial radialis 25 units Flexor carpi radialis 50 units Flexor digitorum superficialis 50 units Flexor digitorum profundus 50 units Pronator teres 25 units All injections were done after obtaining appropriate EMG activity and after negative drawback for blood. The patient tolerated the procedure well. Post procedure instructions were given. A followup appointment was made.

## 2020-01-29 NOTE — Patient Instructions (Signed)

## 2020-01-29 NOTE — Progress Notes (Signed)
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 Chronic cervical pain not relieved by other treatments.  Informed consent was obtained after describing risks and benefits of the procedure with the patient this included bleeding bruising and infection The patient elects to proceed and has given Written consent.  REMS form completed  Patient placed in a seated position A 27-gauge 1 inch needle electrode was used to guide the injection under EMG guidance.  Muscles and dosing: Right splenius capitis 25 units x 2 = 50 units Right longissimus capitus 25 units Right levator scapula 25 units x 3= 75 units   All injections done after negative drawback for blood. Patient tolerated procedure well. Post procedure instructions given. Follow up appointment made

## 2020-03-06 DIAGNOSIS — Z299 Encounter for prophylactic measures, unspecified: Secondary | ICD-10-CM | POA: Diagnosis not present

## 2020-03-06 DIAGNOSIS — I10 Essential (primary) hypertension: Secondary | ICD-10-CM | POA: Diagnosis not present

## 2020-03-06 DIAGNOSIS — F909 Attention-deficit hyperactivity disorder, unspecified type: Secondary | ICD-10-CM | POA: Diagnosis not present

## 2020-03-06 DIAGNOSIS — I83009 Varicose veins of unspecified lower extremity with ulcer of unspecified site: Secondary | ICD-10-CM | POA: Diagnosis not present

## 2020-03-06 DIAGNOSIS — L97909 Non-pressure chronic ulcer of unspecified part of unspecified lower leg with unspecified severity: Secondary | ICD-10-CM | POA: Diagnosis not present

## 2020-03-10 DIAGNOSIS — R52 Pain, unspecified: Secondary | ICD-10-CM | POA: Diagnosis not present

## 2020-03-11 ENCOUNTER — Encounter: Payer: Self-pay | Admitting: Physical Medicine & Rehabilitation

## 2020-03-11 ENCOUNTER — Encounter: Payer: PPO | Attending: Physical Medicine & Rehabilitation | Admitting: Physical Medicine & Rehabilitation

## 2020-03-11 ENCOUNTER — Other Ambulatory Visit: Payer: Self-pay

## 2020-03-11 VITALS — BP 126/79 | HR 93 | Temp 97.5°F | Ht 72.0 in | Wt 150.4 lb

## 2020-03-11 DIAGNOSIS — G8110 Spastic hemiplegia affecting unspecified side: Secondary | ICD-10-CM | POA: Diagnosis not present

## 2020-03-11 DIAGNOSIS — G243 Spasmodic torticollis: Secondary | ICD-10-CM | POA: Diagnosis not present

## 2020-03-11 NOTE — Progress Notes (Signed)
Subjective:    Patient ID: Daniel Arroyo, male    DOB: 1971-04-02, 49 y.o.   MRN: [id number]  HPI  Patient notes good improvement in left finger and wrist spasticity.  Has not noted much in terms of elbow flexor.  Also he feels like his head is still pulled downward and turned to the right  Left side FCU 50 units Brachial radialis 25 units Flexor carpi radialis 50 units Flexor digitorum superficialis 50 units Flexor digitorum profundus 50 units Pronator teres 25 units  Muscles and dosing: Right splenius capitis 25 units x 2 = 50 units Right longissimus capitus 25 units Right levator scapula 25 units x 3= 75 units Pain Inventory Average Pain 8 Pain Right Now 6 My pain is intermittent and sharp  In the last 24 hours, has pain interfered with the following? General activity 7 Relation with others 0 Enjoyment of life 9 What TIME of day is your pain at its worst? morning Sleep (in general) Fair  Pain is worse with: walking, bending, sitting, standing and some activites Pain improves with: rest and medication Relief from Meds: 4  Mobility walk without assistance use a cane ability to climb steps?  yes do you drive?  no  Function disabled: date disabled . I need assistance with the following:  household duties and shopping  Neuro/Psych depression  Prior Studies Any changes since last visit?  no  Physicians involved in your care Any changes since last visit?  no   Family History  Problem Relation Age of Onset  . Heart attack Mother        Died in early 65's  . HIV Brother        Died in early 38's   Social History   Socioeconomic History  . Marital status: Divorced    Spouse name: Not on file  . Number of children: Not on file  . Years of education: Not on file  . Highest education level: Not on file  Occupational History  . Not on file  Tobacco Use  . Smoking status: Current Every Day Smoker    Packs/day: 0.50    Types: Cigarettes  . Smokeless  tobacco: Never Used  Vaping Use  . Vaping Use: Never used  Substance and Sexual Activity  . Alcohol use: Yes    Comment: in rehab   . Drug use: Yes    Types: Heroin, Marijuana    Comment: in rehab  . Sexual activity: Not on file  Other Topics Concern  . Not on file  Social History Narrative   Right handed      Lives with father   Social Determinants of Health   Financial Resource Strain:   . Difficulty of Paying Living Expenses:   Food Insecurity:   . Worried About Charity fundraiser in the Last Year:   . Arboriculturist in the Last Year:   Transportation Needs:   . Film/video editor (Medical):   Marland Kitchen Lack of Transportation (Non-Medical):   Physical Activity:   . Days of Exercise per Week:   . Minutes of Exercise per Session:   Stress:   . Feeling of Stress :   Social Connections:   . Frequency of Communication with Friends and Family:   . Frequency of Social Gatherings with Friends and Family:   . Attends Religious Services:   . Active Member of Clubs or Organizations:   . Attends Archivist Meetings:   Marland Kitchen Marital Status:  Past Surgical History:  Procedure Laterality Date  . TONSILLECTOMY     Past Medical History:  Diagnosis Date  . ADHD, adult residual type   . Anxiety and depression   . Bipolar disorder (HCC)   . Depression   . Gastroesophageal reflux disease   . History of stroke 2018   Reportedly in the setting of substance abuse  . Knee pain   . Polysubstance abuse (HCC)   . Stroke (HCC)    BP 126/79   Pulse 93   Temp (!) 97.5 F (36.4 C)   Ht 6' (1.829 m)   Wt 150 lb 6.4 oz (68.2 kg)   SpO2 95%   BMI 20.40 kg/m   Opioid Risk Score:   Fall Risk Score:  `1  Depression screen PHQ 2/9  Depression screen [facility] 2/9 03/11/2020 09/06/2019  Decreased Interest 0 1  Down, Depressed, Hopeless 0 0  PHQ - 2 Score 0 1  Altered sleeping - 1  Tired, decreased energy - 3  Change in appetite - 1  Feeling bad or failure about yourself  - 0    Trouble concentrating - 0  Moving slowly or fidgety/restless - 0  Suicidal thoughts - 0  PHQ-9 Score - 6  Difficult doing work/chores - Not difficult at all    Review of Systems  Constitutional: Negative.   HENT: Negative.   Eyes: Negative.   Respiratory: Negative.   Cardiovascular: Negative.   Gastrointestinal: Negative.   Endocrine: Negative.   Genitourinary: Negative.   Musculoskeletal: Negative.   Skin: Negative.   Allergic/Immunologic: Negative.   Neurological: Negative.   Hematological: Negative.   Psychiatric/Behavioral: Positive for dysphoric mood.  All other systems reviewed and are negative.      Objective:   Physical Exam Vitals and nursing note reviewed.  Constitutional:      Appearance: He is ill-appearing.  HENT:     Head: Normocephalic and atraumatic.  Eyes:     Extraocular Movements: Extraocular movements intact.     Conjunctiva/sclera: Conjunctivae normal.     Pupils: Pupils are equal, round, and reactive to light.  Neurological:     Mental Status: He is alert and oriented to person, place, and time.  Psychiatric:        Mood and Affect: Mood normal.     Motor strength is 3 - at the left deltoid, left bicep with elevated tone difficult to manual muscle test 0/5 in the finger and wrist flexors and extensors Cervical spine lateral flexion to the right head rotation chin toward the right Patient is unable to turn his head toward the left or extend his cervical spine.  Tightness in both sternocleidomastoids both levator scapula Ambulates with a cane left arm flexed position at the elbow      Assessment & Plan:  #1.  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  Cervical Dystonia- CVA related  EMG mapping for cervical dystonia to distribute 500U diluted into 67ml for 50U per .56ml

## 2020-04-01 DIAGNOSIS — F909 Attention-deficit hyperactivity disorder, unspecified type: Secondary | ICD-10-CM | POA: Diagnosis not present

## 2020-04-01 DIAGNOSIS — I10 Essential (primary) hypertension: Secondary | ICD-10-CM | POA: Diagnosis not present

## 2020-04-01 DIAGNOSIS — R569 Unspecified convulsions: Secondary | ICD-10-CM | POA: Diagnosis not present

## 2020-04-01 DIAGNOSIS — F17210 Nicotine dependence, cigarettes, uncomplicated: Secondary | ICD-10-CM | POA: Diagnosis not present

## 2020-04-01 DIAGNOSIS — G4700 Insomnia, unspecified: Secondary | ICD-10-CM | POA: Diagnosis not present

## 2020-04-01 DIAGNOSIS — Z299 Encounter for prophylactic measures, unspecified: Secondary | ICD-10-CM | POA: Diagnosis not present

## 2020-04-10 ENCOUNTER — Other Ambulatory Visit: Payer: Self-pay

## 2020-04-10 ENCOUNTER — Encounter: Payer: PPO | Attending: Physical Medicine & Rehabilitation | Admitting: Physical Medicine & Rehabilitation

## 2020-04-10 ENCOUNTER — Encounter: Payer: Self-pay | Admitting: Physical Medicine & Rehabilitation

## 2020-04-10 VITALS — BP 159/97 | HR 102 | Temp 97.9°F | Ht 72.0 in | Wt 145.0 lb

## 2020-04-10 DIAGNOSIS — G8114 Spastic hemiplegia affecting left nondominant side: Secondary | ICD-10-CM | POA: Diagnosis not present

## 2020-04-10 DIAGNOSIS — I69354 Hemiplegia and hemiparesis following cerebral infarction affecting left non-dominant side: Secondary | ICD-10-CM | POA: Insufficient documentation

## 2020-04-10 DIAGNOSIS — G243 Spasmodic torticollis: Secondary | ICD-10-CM | POA: Insufficient documentation

## 2020-04-10 NOTE — Progress Notes (Signed)
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  The patient had post stroke seizure disorder.  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 Right MCA infarct March 01 2017  The patient has had problems with tightness on the right side of his neck, diagnosed as cervical dystonia as well as left upper extremity spastic hemiparesis.  He has received extensive physical and occupational therapy.  He has tried botulinum toxin injections in January 2021 which were helpful for his left upper extremity but the cervical dystonia was only minimally improved 10/04/2019 Muscles and dosing: Right splenius capitis 25 units x 2 = 50 units Right sternocleidomastoid 25 units Right levator scapula 25 units x 2= 50 units  Because of his response in January a higher dose of botulinum toxin was injected at the subsequent treatment 01/29/2020 Right splenius capitis 25 units x 2 = 50 units Right longissimus capitus 25 units Right levator scapula 25 units x 3= 75 units  The patient did not have significantly better response with the higher dose. We discussed cervical mapping as means of identifying the most problematic muscle groups.  Cervical EMG  Levator scapula Right 1-2+ motor unit action potentials, no fibs or PSW Left 2+ motor unit action potentials, no fibs or PSW  Upper trapezius Right 3+ motor unit action potentials, no fibs or PSW Left 1-2+ motor unit action potentials, no fibs or PSW  Inferior capitis obliquis Right 3+ motor unit action potentials, no fibs or PSW Left 1+ motor unit action potentials, no fibs or PSW  Longissimus capitis Right 3-4+ motor unit action potentials, no fibs or PSW Left 2+ motor unit action potentials, no fibs or PSW  Splenius capitis Right 3+ motor unit action potentials, no fibs or PSW Left 1+ motor unit action potentials, no  fibs or PSW  Sternocleidomastoid Right no motor unit action potentials, no fibs or PSW Left 2+ motor unit action potentials, no fibs or PSW  Impression Cervical dystonia affecting the following muscle groups most significantly, right upper trapezius, right inferior capitis oblique is, right longissimus capitis, right splenius capitis, left sternocleidomastoid  Recommendation Dysport 500 units diluted at 50 units per 0.1 mL  Right upper trapezius 100 units Right inferior capitis oblique 100 units Right longissimus capitis 100 units Right splenius capitis 100 units Left sternocleidomastoid 50 units Left levator scapula 50 units  Dysport diluted at 100 units per 0.5 mL for spasticity Left FDS 200 units Left FDP 200 units Left FCR 200 units Left FCU 200 units Left brachial radialis 100 units Left pronator teres 100 units

## 2020-04-10 NOTE — Patient Instructions (Addendum)
Would recommend Dysport total of 1500U  500U for neck, cervical dystonia  1000U for Left arm

## 2020-04-17 ENCOUNTER — Ambulatory Visit: Payer: PPO | Admitting: Physical Medicine & Rehabilitation

## 2020-05-07 DIAGNOSIS — F1911 Other psychoactive substance abuse, in remission: Secondary | ICD-10-CM | POA: Diagnosis not present

## 2020-05-07 DIAGNOSIS — Z299 Encounter for prophylactic measures, unspecified: Secondary | ICD-10-CM | POA: Diagnosis not present

## 2020-05-07 DIAGNOSIS — F909 Attention-deficit hyperactivity disorder, unspecified type: Secondary | ICD-10-CM | POA: Diagnosis not present

## 2020-05-07 DIAGNOSIS — N529 Male erectile dysfunction, unspecified: Secondary | ICD-10-CM | POA: Diagnosis not present

## 2020-05-07 DIAGNOSIS — I10 Essential (primary) hypertension: Secondary | ICD-10-CM | POA: Diagnosis not present

## 2020-05-08 ENCOUNTER — Encounter: Payer: PPO | Attending: Physical Medicine & Rehabilitation | Admitting: Physical Medicine & Rehabilitation

## 2020-05-08 ENCOUNTER — Encounter: Payer: Self-pay | Admitting: Physical Medicine & Rehabilitation

## 2020-05-08 ENCOUNTER — Other Ambulatory Visit: Payer: Self-pay

## 2020-05-08 VITALS — BP 148/90 | HR 107 | Temp 98.5°F | Ht 72.0 in | Wt 143.8 lb

## 2020-05-08 DIAGNOSIS — I69354 Hemiplegia and hemiparesis following cerebral infarction affecting left non-dominant side: Secondary | ICD-10-CM | POA: Diagnosis not present

## 2020-05-08 DIAGNOSIS — G8110 Spastic hemiplegia affecting unspecified side: Secondary | ICD-10-CM

## 2020-05-08 DIAGNOSIS — G8114 Spastic hemiplegia affecting left nondominant side: Secondary | ICD-10-CM | POA: Diagnosis not present

## 2020-05-08 DIAGNOSIS — G243 Spasmodic torticollis: Secondary | ICD-10-CM | POA: Insufficient documentation

## 2020-05-08 NOTE — Patient Instructions (Signed)

## 2020-05-08 NOTE — Progress Notes (Signed)
Dysport Injection for spasticity and cervical dystonia using needle EMG guidance          Indication: Severe spasticity which interferes with ADL,mobility and/or  hygiene and is unresponsive to medication management and other conservative care Informed consent was obtained after describing risks and benefits of the procedure with the patient. This includes bleeding, bruising, infection, excessive weakness, or medication side effects. A REMS form is on file and signed. Needle:  needle electrode Number of units per muscle Dysport diluted at 100 units per 0.5 mL for spasticity Left FDS 200 units Left FDP 200 units Left FCR 200 units Left FCU 200 units Left brachial radialis 100 units Left pronator teres 100 units All injections were done after obtaining appropriate EMG activity and after negative drawback for blood. The patient tolerated the procedure well. Post procedure instructions were given. A followup appointment was made.    Dysport for cervical dystonia, G24.3  CD affecting head and neck positioning and causing neck pain   Dysport 500 units diluted at 50 units per 0.1 mL  Right upper trapezius 100 units Right inferior capitis oblique 100 units Right longissimus capitis 100 units Right splenius capitis 100 units Left sternocleidomastoid 50 units Left levator scapula 50 units  Patient tolerated procedure well.  Postinjection instructions given.

## 2020-05-12 ENCOUNTER — Telehealth: Payer: Self-pay

## 2020-06-09 DIAGNOSIS — Z299 Encounter for prophylactic measures, unspecified: Secondary | ICD-10-CM | POA: Diagnosis not present

## 2020-06-09 DIAGNOSIS — I10 Essential (primary) hypertension: Secondary | ICD-10-CM | POA: Diagnosis not present

## 2020-06-09 DIAGNOSIS — F909 Attention-deficit hyperactivity disorder, unspecified type: Secondary | ICD-10-CM | POA: Diagnosis not present

## 2020-06-09 DIAGNOSIS — F1911 Other psychoactive substance abuse, in remission: Secondary | ICD-10-CM | POA: Diagnosis not present

## 2020-06-18 ENCOUNTER — Ambulatory Visit: Payer: PPO | Admitting: Neurology

## 2020-06-19 ENCOUNTER — Encounter: Payer: Self-pay | Admitting: Physical Medicine & Rehabilitation

## 2020-06-19 ENCOUNTER — Other Ambulatory Visit: Payer: Self-pay

## 2020-06-19 ENCOUNTER — Encounter: Payer: PPO | Attending: Physical Medicine & Rehabilitation | Admitting: Physical Medicine & Rehabilitation

## 2020-06-19 VITALS — BP 167/80 | HR 100 | Ht 72.0 in | Wt 145.0 lb

## 2020-06-19 DIAGNOSIS — G8114 Spastic hemiplegia affecting left nondominant side: Secondary | ICD-10-CM | POA: Diagnosis not present

## 2020-06-19 DIAGNOSIS — G8110 Spastic hemiplegia affecting unspecified side: Secondary | ICD-10-CM | POA: Diagnosis not present

## 2020-06-19 DIAGNOSIS — G243 Spasmodic torticollis: Secondary | ICD-10-CM | POA: Insufficient documentation

## 2020-06-19 DIAGNOSIS — I69354 Hemiplegia and hemiparesis following cerebral infarction affecting left non-dominant side: Secondary | ICD-10-CM | POA: Insufficient documentation

## 2020-06-19 NOTE — Progress Notes (Signed)
Subjective:    Patient ID: Daniel Arroyo, male    DOB: [birth year], 49 y.o.   MRN: [id number]  HPI 49 year old male with history of right MCA distribution infarct causing left spastic hemiplegia.  He also has a cervical dystonia causing severe neck pain primarily right-sided  Dysport Injection per formed 05/08/2020 G 81.10 Left FDS 200 units Left FDP 200 units Left FCR 200 units Left FCU 200 units Left brachial radialis 100 units Left pronator teres 100 units  Dysport injection performed 05/08/2020  Dysport for cervical dystonia, G24.3  CD affecting head and neck positioning and causing neck pain   Dysport 500 units diluted at 50 units per 0.1 mL  Right upper trapezius 100 units Right inferior capitis oblique 100 units Right longissimus capitis 100 units Right splenius capitis 100 units Left sternocleidomastoid 50 units Left levator scapula 50 units  Reading more easily due to improvement in neck movement, left wrist does not feel as tight fingers are definitely looser.  His elbow is a little looser but he still feels like it is very difficult to extend even passively.  Patient would like to discuss other options for this. Pain Inventory Average Pain 3 Pain Right Now 3 My pain is aching  In the last 24 hours, has pain interfered with the following? General activity 0 Relation with others 0 Enjoyment of life 0 What TIME of day is your pain at its worst? varies Sleep (in general) Fair  Pain is worse with: some activites Pain improves with: ? Relief from Meds: ?  Family History  Problem Relation Age of Onset  . Heart attack Mother        Died in early 27's  . HIV Brother        Died in early 74's   Social History   Socioeconomic History  . Marital status: Divorced    Spouse name: Not on file  . Number of children: Not on file  . Years of education: Not on file  . Highest education level: Not on file  Occupational History  . Not on file  Tobacco Use  .  Smoking status: Current Every Day Smoker    Packs/day: 0.50    Types: Cigarettes  . Smokeless tobacco: Never Used  Vaping Use  . Vaping Use: Never used  Substance and Sexual Activity  . Alcohol use: Yes    Comment: in rehab   . Drug use: Yes    Types: Heroin, Marijuana    Comment: in rehab  . Sexual activity: Not on file  Other Topics Concern  . Not on file  Social History Narrative   Right handed      Lives with father   Social Determinants of Health   Financial Resource Strain:   . Difficulty of Paying Living Expenses: Not on file  Food Insecurity:   . Worried About Programme researcher, broadcasting/film/video in the Last Year: Not on file  . Ran Out of Food in the Last Year: Not on file  Transportation Needs:   . Lack of Transportation (Medical): Not on file  . Lack of Transportation (Non-Medical): Not on file  Physical Activity:   . Days of Exercise per Week: Not on file  . Minutes of Exercise per Session: Not on file  Stress:   . Feeling of Stress : Not on file  Social Connections:   . Frequency of Communication with Friends and Family: Not on file  . Frequency of Social Gatherings with Friends and Family: Not  on file  . Attends Religious Services: Not on file  . Active Member of Clubs or Organizations: Not on file  . Attends Banker Meetings: Not on file  . Marital Status: Not on file   Past Surgical History:  Procedure Laterality Date  . TONSILLECTOMY     Past Surgical History:  Procedure Laterality Date  . TONSILLECTOMY     Past Medical History:  Diagnosis Date  . ADHD, adult residual type   . Anxiety and depression   . Bipolar disorder (HCC)   . Depression   . Gastroesophageal reflux disease   . History of stroke 2018   Reportedly in the setting of substance abuse  . Knee pain   . Polysubstance abuse (HCC)   . Stroke (HCC)    BP (!) 167/80   Pulse 100   Ht 6' (1.829 m)   Wt 145 lb (65.8 kg)   SpO2 98%   BMI 19.67 kg/m   Opioid Risk Score:   Fall  Risk Score:  `1  Depression screen PHQ 2/9  Depression screen [facility] 2/9 05/08/2020 03/11/2020 09/06/2019  Decreased Interest 0 0 1  Down, Depressed, Hopeless 0 0 0  PHQ - 2 Score 0 0 1  Altered sleeping - - 1  Tired, decreased energy - - 3  Change in appetite - - 1  Feeling bad or failure about yourself  - - 0  Trouble concentrating - - 0  Moving slowly or fidgety/restless - - 0  Suicidal thoughts - - 0  PHQ-9 Score - - 6  Difficult doing work/chores - - Not difficult at all    Review of Systems  Constitutional: Negative.   HENT: Negative.   Eyes: Negative.   Respiratory: Negative.   Cardiovascular: Negative.   Gastrointestinal: Negative.   Endocrine: Negative.   Genitourinary: Negative.   Musculoskeletal: Positive for neck pain.  Skin: Negative.   Allergic/Immunologic: Negative.   Neurological: Negative.   Hematological: Negative.   Psychiatric/Behavioral: Negative.   All other systems reviewed and are negative.      Objective:   Physical Exam Vitals and nursing note reviewed.  Constitutional:      Appearance: He is ill-appearing.  Eyes:     Extraocular Movements: Extraocular movements intact.     Conjunctiva/sclera: Conjunctivae normal.     Pupils: Pupils are equal, round, and reactive to light.  Neck:     Comments: Flexed forward and to the right  Musculoskeletal:     Cervical back: Rigidity present.  Neurological:     Comments: 3- Left delt, biceps , triceps , grip, finger flex, wrist ext     Tone MAS 4 at the left elbow flexor MAS 3 at the wrist flexor MAS 0 at the finger flexors  Cervical spine tenderness left upper trap no tenderness along the cervical spinous processes.  No pain with passive range of motion of the neck still very tight endpoint toward the right side.      Assessment & Plan:    Dysport Injection  Left FDS 200 units Left FDP 200 units Left FCR 200 units Left FCU 200 units Left opponens pollicis 100U                    Dysport for cervical dystonia, G24.3  CD affecting head and neck positioning and causing neck pain   Dysport 500 units diluted at 50 units per 0.1 mL  Right upper trapezius 100 units Right inferior capitis oblique 100 units  Right longissimus capitis 100 units Right splenius capitis 100 units Left sternocleidomastoid 50 units Left levator scapula 50 units    Consider referral to Ortho for Left elbow ROM under GA , other treatment option includes musculocutaneous nerve block and if helpful on a temporary basis, perform phenol injection  OT for wrist hand orthosis custom molded, will wait until repeat Dysport injection to make referral

## 2020-07-10 DIAGNOSIS — Z0000 Encounter for general adult medical examination without abnormal findings: Secondary | ICD-10-CM | POA: Diagnosis not present

## 2020-07-10 DIAGNOSIS — Z1331 Encounter for screening for depression: Secondary | ICD-10-CM | POA: Diagnosis not present

## 2020-07-10 DIAGNOSIS — Z79899 Other long term (current) drug therapy: Secondary | ICD-10-CM | POA: Diagnosis not present

## 2020-07-10 DIAGNOSIS — Z1339 Encounter for screening examination for other mental health and behavioral disorders: Secondary | ICD-10-CM | POA: Diagnosis not present

## 2020-07-10 DIAGNOSIS — F17210 Nicotine dependence, cigarettes, uncomplicated: Secondary | ICD-10-CM | POA: Diagnosis not present

## 2020-07-10 DIAGNOSIS — I10 Essential (primary) hypertension: Secondary | ICD-10-CM | POA: Diagnosis not present

## 2020-07-10 DIAGNOSIS — Z299 Encounter for prophylactic measures, unspecified: Secondary | ICD-10-CM | POA: Diagnosis not present

## 2020-07-10 DIAGNOSIS — F909 Attention-deficit hyperactivity disorder, unspecified type: Secondary | ICD-10-CM | POA: Diagnosis not present

## 2020-07-10 DIAGNOSIS — Z7189 Other specified counseling: Secondary | ICD-10-CM | POA: Diagnosis not present

## 2020-07-10 DIAGNOSIS — R5383 Other fatigue: Secondary | ICD-10-CM | POA: Diagnosis not present

## 2020-08-08 DIAGNOSIS — F17210 Nicotine dependence, cigarettes, uncomplicated: Secondary | ICD-10-CM | POA: Diagnosis not present

## 2020-08-08 DIAGNOSIS — F909 Attention-deficit hyperactivity disorder, unspecified type: Secondary | ICD-10-CM | POA: Diagnosis not present

## 2020-08-08 DIAGNOSIS — I10 Essential (primary) hypertension: Secondary | ICD-10-CM | POA: Diagnosis not present

## 2020-08-08 DIAGNOSIS — Z299 Encounter for prophylactic measures, unspecified: Secondary | ICD-10-CM | POA: Diagnosis not present

## 2020-08-12 ENCOUNTER — Other Ambulatory Visit: Payer: Self-pay

## 2020-08-12 ENCOUNTER — Encounter: Payer: Self-pay | Admitting: Physical Medicine & Rehabilitation

## 2020-08-12 ENCOUNTER — Encounter: Payer: PPO | Attending: Physical Medicine & Rehabilitation | Admitting: Physical Medicine & Rehabilitation

## 2020-08-12 VITALS — BP 132/93 | HR 71 | Temp 98.0°F | Ht 72.0 in | Wt 152.4 lb

## 2020-08-12 DIAGNOSIS — G8110 Spastic hemiplegia affecting unspecified side: Secondary | ICD-10-CM | POA: Diagnosis not present

## 2020-08-12 DIAGNOSIS — G243 Spasmodic torticollis: Secondary | ICD-10-CM | POA: Diagnosis not present

## 2020-08-12 NOTE — Patient Instructions (Signed)

## 2020-08-12 NOTE — Progress Notes (Signed)
Dysport for cervical dystonia, G24.3  CD affecting head and neck positioning and causing neck pain   Dysport 500 units diluted at 50 units per 0.1 mL  Right upper trapezius 100 units 1+ Right inferior capitis oblique 100 units 1+ Right longissimus capitis 50 units 1+ Right splenius capitis 100 units 2+ Left sternocleidomastoid 50 units 0-1+ Left levator scapula 50 units 0-1+

## 2020-08-12 NOTE — Progress Notes (Signed)
  Dysport Injection for spasticity using needle EMG guidance  Dilution: 200 Units/ml Indication: Severe spasticity which interferes with ADL,mobility and/or  hygiene and is unresponsive to medication management and other conservative care Informed consent was obtained after describing risks and benefits of the procedure with the patient. This includes bleeding, bruising, infection, excessive weakness, or medication side effects. A REMS form is on file and signed. Needle:  needle electrode Number of units per muscle G 81.10 Left FDS 200 units 1+ Left FDP 200 units 0-1+ Left FCR 200 units 1+ Left biceps 100 units 2+ Left brachial radialis 100 units 0-1+ Left pronator teres 100 units 1 + All injections were done after obtaining appropriate EMG activity and after negative drawback for blood. The patient tolerated the procedure well. Post procedure instructions were given. A followup appointment was made.   Consider reducing dose and concentrating on Left bicep/brachialis, eval clinically next visit

## 2020-09-02 DIAGNOSIS — I10 Essential (primary) hypertension: Secondary | ICD-10-CM | POA: Diagnosis not present

## 2020-09-02 DIAGNOSIS — Z299 Encounter for prophylactic measures, unspecified: Secondary | ICD-10-CM | POA: Diagnosis not present

## 2020-09-02 DIAGNOSIS — F909 Attention-deficit hyperactivity disorder, unspecified type: Secondary | ICD-10-CM | POA: Diagnosis not present

## 2020-09-02 DIAGNOSIS — F1911 Other psychoactive substance abuse, in remission: Secondary | ICD-10-CM | POA: Diagnosis not present

## 2020-09-14 DIAGNOSIS — R0602 Shortness of breath: Secondary | ICD-10-CM | POA: Diagnosis not present

## 2020-09-14 DIAGNOSIS — I10 Essential (primary) hypertension: Secondary | ICD-10-CM | POA: Diagnosis not present

## 2020-09-14 DIAGNOSIS — F17290 Nicotine dependence, other tobacco product, uncomplicated: Secondary | ICD-10-CM | POA: Diagnosis not present

## 2020-09-14 DIAGNOSIS — J439 Emphysema, unspecified: Secondary | ICD-10-CM | POA: Diagnosis not present

## 2020-09-14 DIAGNOSIS — Z8673 Personal history of transient ischemic attack (TIA), and cerebral infarction without residual deficits: Secondary | ICD-10-CM | POA: Diagnosis not present

## 2020-09-14 DIAGNOSIS — Z20822 Contact with and (suspected) exposure to covid-19: Secondary | ICD-10-CM | POA: Diagnosis not present

## 2020-09-14 DIAGNOSIS — I700 Atherosclerosis of aorta: Secondary | ICD-10-CM | POA: Diagnosis not present

## 2020-09-14 DIAGNOSIS — I7103 Dissection of thoracoabdominal aorta: Secondary | ICD-10-CM | POA: Diagnosis not present

## 2020-09-15 DIAGNOSIS — I69398 Other sequelae of cerebral infarction: Secondary | ICD-10-CM | POA: Diagnosis not present

## 2020-09-15 DIAGNOSIS — I083 Combined rheumatic disorders of mitral, aortic and tricuspid valves: Secondary | ICD-10-CM | POA: Diagnosis not present

## 2020-09-15 DIAGNOSIS — I340 Nonrheumatic mitral (valve) insufficiency: Secondary | ICD-10-CM | POA: Diagnosis not present

## 2020-09-15 DIAGNOSIS — G8114 Spastic hemiplegia affecting left nondominant side: Secondary | ICD-10-CM | POA: Diagnosis not present

## 2020-09-15 DIAGNOSIS — E559 Vitamin D deficiency, unspecified: Secondary | ICD-10-CM | POA: Diagnosis not present

## 2020-09-15 DIAGNOSIS — I081 Rheumatic disorders of both mitral and tricuspid valves: Secondary | ICD-10-CM | POA: Diagnosis not present

## 2020-09-15 DIAGNOSIS — Z6821 Body mass index (BMI) 21.0-21.9, adult: Secondary | ICD-10-CM | POA: Diagnosis not present

## 2020-09-15 DIAGNOSIS — I314 Cardiac tamponade: Secondary | ICD-10-CM | POA: Diagnosis not present

## 2020-09-15 DIAGNOSIS — I469 Cardiac arrest, cause unspecified: Secondary | ICD-10-CM | POA: Diagnosis not present

## 2020-09-15 DIAGNOSIS — I7102 Dissection of abdominal aorta: Secondary | ICD-10-CM | POA: Diagnosis not present

## 2020-09-15 DIAGNOSIS — L97929 Non-pressure chronic ulcer of unspecified part of left lower leg with unspecified severity: Secondary | ICD-10-CM | POA: Diagnosis not present

## 2020-09-15 DIAGNOSIS — G243 Spasmodic torticollis: Secondary | ICD-10-CM | POA: Diagnosis not present

## 2020-09-15 DIAGNOSIS — J984 Other disorders of lung: Secondary | ICD-10-CM | POA: Diagnosis not present

## 2020-09-15 DIAGNOSIS — G8918 Other acute postprocedural pain: Secondary | ICD-10-CM | POA: Diagnosis not present

## 2020-09-15 DIAGNOSIS — F1420 Cocaine dependence, uncomplicated: Secondary | ICD-10-CM | POA: Diagnosis not present

## 2020-09-15 DIAGNOSIS — I10 Essential (primary) hypertension: Secondary | ICD-10-CM | POA: Diagnosis not present

## 2020-09-15 DIAGNOSIS — Z9889 Other specified postprocedural states: Secondary | ICD-10-CM | POA: Diagnosis not present

## 2020-09-15 DIAGNOSIS — J811 Chronic pulmonary edema: Secondary | ICD-10-CM | POA: Diagnosis not present

## 2020-09-15 DIAGNOSIS — I7101 Dissection of thoracic aorta: Secondary | ICD-10-CM | POA: Diagnosis not present

## 2020-09-15 DIAGNOSIS — Z952 Presence of prosthetic heart valve: Secondary | ICD-10-CM | POA: Diagnosis not present

## 2020-09-15 DIAGNOSIS — I712 Thoracic aortic aneurysm, without rupture: Secondary | ICD-10-CM | POA: Diagnosis not present

## 2020-09-15 DIAGNOSIS — F05 Delirium due to known physiological condition: Secondary | ICD-10-CM | POA: Diagnosis not present

## 2020-09-15 DIAGNOSIS — Z9911 Dependence on respirator [ventilator] status: Secondary | ICD-10-CM | POA: Diagnosis not present

## 2020-09-15 DIAGNOSIS — R0989 Other specified symptoms and signs involving the circulatory and respiratory systems: Secondary | ICD-10-CM | POA: Diagnosis not present

## 2020-09-15 DIAGNOSIS — I872 Venous insufficiency (chronic) (peripheral): Secondary | ICD-10-CM | POA: Diagnosis not present

## 2020-09-15 DIAGNOSIS — R918 Other nonspecific abnormal finding of lung field: Secondary | ICD-10-CM | POA: Diagnosis not present

## 2020-09-15 DIAGNOSIS — D62 Acute posthemorrhagic anemia: Secondary | ICD-10-CM | POA: Diagnosis not present

## 2020-09-15 DIAGNOSIS — F1120 Opioid dependence, uncomplicated: Secondary | ICD-10-CM | POA: Diagnosis not present

## 2020-09-15 DIAGNOSIS — Z720 Tobacco use: Secondary | ICD-10-CM | POA: Diagnosis not present

## 2020-09-15 DIAGNOSIS — F1990 Other psychoactive substance use, unspecified, uncomplicated: Secondary | ICD-10-CM | POA: Diagnosis not present

## 2020-09-15 DIAGNOSIS — Z6820 Body mass index (BMI) 20.0-20.9, adult: Secondary | ICD-10-CM | POA: Diagnosis not present

## 2020-09-15 DIAGNOSIS — J9811 Atelectasis: Secondary | ICD-10-CM | POA: Diagnosis not present

## 2020-09-15 DIAGNOSIS — L97919 Non-pressure chronic ulcer of unspecified part of right lower leg with unspecified severity: Secondary | ICD-10-CM | POA: Diagnosis not present

## 2020-09-15 DIAGNOSIS — J939 Pneumothorax, unspecified: Secondary | ICD-10-CM | POA: Diagnosis not present

## 2020-09-15 DIAGNOSIS — F1410 Cocaine abuse, uncomplicated: Secondary | ICD-10-CM | POA: Diagnosis not present

## 2020-09-15 DIAGNOSIS — K5903 Drug induced constipation: Secondary | ICD-10-CM | POA: Diagnosis not present

## 2020-09-15 DIAGNOSIS — I711 Thoracic aortic aneurysm, ruptured: Secondary | ICD-10-CM | POA: Diagnosis not present

## 2020-09-15 DIAGNOSIS — I83019 Varicose veins of right lower extremity with ulcer of unspecified site: Secondary | ICD-10-CM | POA: Diagnosis not present

## 2020-09-15 DIAGNOSIS — G40909 Epilepsy, unspecified, not intractable, without status epilepticus: Secondary | ICD-10-CM | POA: Diagnosis not present

## 2020-09-15 DIAGNOSIS — F1020 Alcohol dependence, uncomplicated: Secondary | ICD-10-CM | POA: Diagnosis not present

## 2020-09-15 DIAGNOSIS — M549 Dorsalgia, unspecified: Secondary | ICD-10-CM | POA: Diagnosis not present

## 2020-09-15 DIAGNOSIS — F1910 Other psychoactive substance abuse, uncomplicated: Secondary | ICD-10-CM | POA: Diagnosis not present

## 2020-09-15 DIAGNOSIS — I351 Nonrheumatic aortic (valve) insufficiency: Secondary | ICD-10-CM | POA: Diagnosis not present

## 2020-09-15 DIAGNOSIS — Z4682 Encounter for fitting and adjustment of non-vascular catheter: Secondary | ICD-10-CM | POA: Diagnosis not present

## 2020-09-15 DIAGNOSIS — I97711 Intraoperative cardiac arrest during other surgery: Secondary | ICD-10-CM | POA: Diagnosis not present

## 2020-09-15 DIAGNOSIS — G9341 Metabolic encephalopathy: Secondary | ICD-10-CM | POA: Diagnosis not present

## 2020-09-15 DIAGNOSIS — J90 Pleural effusion, not elsewhere classified: Secondary | ICD-10-CM | POA: Diagnosis not present

## 2020-09-15 DIAGNOSIS — F17200 Nicotine dependence, unspecified, uncomplicated: Secondary | ICD-10-CM | POA: Diagnosis not present

## 2020-09-15 DIAGNOSIS — G9340 Encephalopathy, unspecified: Secondary | ICD-10-CM | POA: Diagnosis not present

## 2020-09-15 DIAGNOSIS — Z452 Encounter for adjustment and management of vascular access device: Secondary | ICD-10-CM | POA: Diagnosis not present

## 2020-09-15 DIAGNOSIS — R509 Fever, unspecified: Secondary | ICD-10-CM | POA: Diagnosis not present

## 2020-09-15 DIAGNOSIS — R739 Hyperglycemia, unspecified: Secondary | ICD-10-CM | POA: Diagnosis not present

## 2020-09-15 DIAGNOSIS — I83029 Varicose veins of left lower extremity with ulcer of unspecified site: Secondary | ICD-10-CM | POA: Diagnosis not present

## 2020-09-15 DIAGNOSIS — I7100 Dissection of unspecified site of aorta: Secondary | ICD-10-CM | POA: Diagnosis not present

## 2020-09-15 DIAGNOSIS — Z20822 Contact with and (suspected) exposure to covid-19: Secondary | ICD-10-CM | POA: Diagnosis not present

## 2020-09-15 DIAGNOSIS — Z978 Presence of other specified devices: Secondary | ICD-10-CM | POA: Diagnosis not present

## 2020-09-15 DIAGNOSIS — I313 Pericardial effusion (noninflammatory): Secondary | ICD-10-CM | POA: Diagnosis not present

## 2020-09-15 DIAGNOSIS — I9742 Intraoperative hemorrhage and hematoma of a circulatory system organ or structure complicating other procedure: Secondary | ICD-10-CM | POA: Diagnosis not present

## 2020-09-15 DIAGNOSIS — F332 Major depressive disorder, recurrent severe without psychotic features: Secondary | ICD-10-CM | POA: Diagnosis not present

## 2020-09-15 DIAGNOSIS — R079 Chest pain, unspecified: Secondary | ICD-10-CM | POA: Diagnosis not present

## 2020-09-15 DIAGNOSIS — E878 Other disorders of electrolyte and fluid balance, not elsewhere classified: Secondary | ICD-10-CM | POA: Diagnosis not present

## 2020-09-15 DIAGNOSIS — F1024 Alcohol dependence with alcohol-induced mood disorder: Secondary | ICD-10-CM | POA: Diagnosis not present

## 2020-09-15 DIAGNOSIS — I959 Hypotension, unspecified: Secondary | ICD-10-CM | POA: Diagnosis not present

## 2020-09-16 DIAGNOSIS — J939 Pneumothorax, unspecified: Secondary | ICD-10-CM | POA: Diagnosis not present

## 2020-09-16 DIAGNOSIS — I712 Thoracic aortic aneurysm, without rupture: Secondary | ICD-10-CM | POA: Diagnosis not present

## 2020-09-16 DIAGNOSIS — Z978 Presence of other specified devices: Secondary | ICD-10-CM | POA: Diagnosis not present

## 2020-09-16 DIAGNOSIS — Z4682 Encounter for fitting and adjustment of non-vascular catheter: Secondary | ICD-10-CM | POA: Diagnosis not present

## 2020-09-16 DIAGNOSIS — I7101 Dissection of thoracic aorta: Secondary | ICD-10-CM | POA: Diagnosis not present

## 2020-09-16 DIAGNOSIS — Z452 Encounter for adjustment and management of vascular access device: Secondary | ICD-10-CM | POA: Diagnosis not present

## 2020-09-17 DIAGNOSIS — I712 Thoracic aortic aneurysm, without rupture: Secondary | ICD-10-CM | POA: Diagnosis not present

## 2020-09-17 DIAGNOSIS — Z4682 Encounter for fitting and adjustment of non-vascular catheter: Secondary | ICD-10-CM | POA: Diagnosis not present

## 2020-09-17 DIAGNOSIS — I081 Rheumatic disorders of both mitral and tricuspid valves: Secondary | ICD-10-CM | POA: Diagnosis not present

## 2020-09-17 DIAGNOSIS — Z952 Presence of prosthetic heart valve: Secondary | ICD-10-CM | POA: Diagnosis not present

## 2020-09-17 DIAGNOSIS — I7101 Dissection of thoracic aorta: Secondary | ICD-10-CM | POA: Diagnosis not present

## 2020-09-18 DIAGNOSIS — I7101 Dissection of thoracic aorta: Secondary | ICD-10-CM | POA: Diagnosis not present

## 2020-09-18 DIAGNOSIS — I712 Thoracic aortic aneurysm, without rupture: Secondary | ICD-10-CM | POA: Diagnosis not present

## 2020-09-18 DIAGNOSIS — I340 Nonrheumatic mitral (valve) insufficiency: Secondary | ICD-10-CM | POA: Diagnosis not present

## 2020-09-19 DIAGNOSIS — I7101 Dissection of thoracic aorta: Secondary | ICD-10-CM | POA: Diagnosis not present

## 2020-09-19 DIAGNOSIS — I351 Nonrheumatic aortic (valve) insufficiency: Secondary | ICD-10-CM | POA: Diagnosis not present

## 2020-09-19 DIAGNOSIS — I7100 Dissection of unspecified site of aorta: Secondary | ICD-10-CM | POA: Diagnosis not present

## 2020-09-19 DIAGNOSIS — Z452 Encounter for adjustment and management of vascular access device: Secondary | ICD-10-CM | POA: Diagnosis not present

## 2020-09-19 DIAGNOSIS — R918 Other nonspecific abnormal finding of lung field: Secondary | ICD-10-CM | POA: Diagnosis not present

## 2020-09-19 DIAGNOSIS — F1910 Other psychoactive substance abuse, uncomplicated: Secondary | ICD-10-CM | POA: Diagnosis not present

## 2020-09-19 DIAGNOSIS — J811 Chronic pulmonary edema: Secondary | ICD-10-CM | POA: Diagnosis not present

## 2020-09-19 DIAGNOSIS — I712 Thoracic aortic aneurysm, without rupture: Secondary | ICD-10-CM | POA: Diagnosis not present

## 2020-09-19 DIAGNOSIS — F1024 Alcohol dependence with alcohol-induced mood disorder: Secondary | ICD-10-CM | POA: Diagnosis not present

## 2020-09-20 DIAGNOSIS — F1024 Alcohol dependence with alcohol-induced mood disorder: Secondary | ICD-10-CM | POA: Diagnosis not present

## 2020-09-20 DIAGNOSIS — I351 Nonrheumatic aortic (valve) insufficiency: Secondary | ICD-10-CM | POA: Diagnosis not present

## 2020-09-20 DIAGNOSIS — I712 Thoracic aortic aneurysm, without rupture: Secondary | ICD-10-CM | POA: Diagnosis not present

## 2020-09-20 DIAGNOSIS — F1910 Other psychoactive substance abuse, uncomplicated: Secondary | ICD-10-CM | POA: Diagnosis not present

## 2020-09-20 DIAGNOSIS — I7100 Dissection of unspecified site of aorta: Secondary | ICD-10-CM | POA: Diagnosis not present

## 2020-09-20 DIAGNOSIS — I7101 Dissection of thoracic aorta: Secondary | ICD-10-CM | POA: Diagnosis not present

## 2020-09-22 DIAGNOSIS — F05 Delirium due to known physiological condition: Secondary | ICD-10-CM | POA: Diagnosis not present

## 2020-09-22 DIAGNOSIS — F1420 Cocaine dependence, uncomplicated: Secondary | ICD-10-CM | POA: Diagnosis not present

## 2020-09-22 DIAGNOSIS — Z452 Encounter for adjustment and management of vascular access device: Secondary | ICD-10-CM | POA: Diagnosis not present

## 2020-09-22 DIAGNOSIS — F1120 Opioid dependence, uncomplicated: Secondary | ICD-10-CM | POA: Diagnosis not present

## 2020-09-22 DIAGNOSIS — F332 Major depressive disorder, recurrent severe without psychotic features: Secondary | ICD-10-CM | POA: Diagnosis not present

## 2020-09-22 DIAGNOSIS — F1024 Alcohol dependence with alcohol-induced mood disorder: Secondary | ICD-10-CM | POA: Diagnosis not present

## 2020-09-22 DIAGNOSIS — I7101 Dissection of thoracic aorta: Secondary | ICD-10-CM | POA: Diagnosis not present

## 2020-09-22 DIAGNOSIS — Z4682 Encounter for fitting and adjustment of non-vascular catheter: Secondary | ICD-10-CM | POA: Diagnosis not present

## 2020-09-22 DIAGNOSIS — J811 Chronic pulmonary edema: Secondary | ICD-10-CM | POA: Diagnosis not present

## 2020-09-22 DIAGNOSIS — R918 Other nonspecific abnormal finding of lung field: Secondary | ICD-10-CM | POA: Diagnosis not present

## 2020-09-23 ENCOUNTER — Encounter: Payer: PPO | Admitting: Physical Medicine & Rehabilitation

## 2020-09-23 DIAGNOSIS — J811 Chronic pulmonary edema: Secondary | ICD-10-CM | POA: Diagnosis not present

## 2020-09-23 DIAGNOSIS — R918 Other nonspecific abnormal finding of lung field: Secondary | ICD-10-CM | POA: Diagnosis not present

## 2020-09-24 DIAGNOSIS — J984 Other disorders of lung: Secondary | ICD-10-CM | POA: Diagnosis not present

## 2020-09-24 DIAGNOSIS — F332 Major depressive disorder, recurrent severe without psychotic features: Secondary | ICD-10-CM | POA: Diagnosis not present

## 2020-09-24 DIAGNOSIS — F1910 Other psychoactive substance abuse, uncomplicated: Secondary | ICD-10-CM | POA: Diagnosis not present

## 2020-09-24 DIAGNOSIS — Z720 Tobacco use: Secondary | ICD-10-CM | POA: Diagnosis not present

## 2020-09-24 DIAGNOSIS — F1120 Opioid dependence, uncomplicated: Secondary | ICD-10-CM | POA: Diagnosis not present

## 2020-09-24 DIAGNOSIS — F1420 Cocaine dependence, uncomplicated: Secondary | ICD-10-CM | POA: Diagnosis not present

## 2020-09-24 DIAGNOSIS — F05 Delirium due to known physiological condition: Secondary | ICD-10-CM | POA: Diagnosis not present

## 2020-09-24 DIAGNOSIS — F1024 Alcohol dependence with alcohol-induced mood disorder: Secondary | ICD-10-CM | POA: Diagnosis not present

## 2020-09-25 DIAGNOSIS — J9811 Atelectasis: Secondary | ICD-10-CM | POA: Diagnosis not present

## 2020-09-25 DIAGNOSIS — I712 Thoracic aortic aneurysm, without rupture: Secondary | ICD-10-CM | POA: Diagnosis not present

## 2020-09-25 DIAGNOSIS — J90 Pleural effusion, not elsewhere classified: Secondary | ICD-10-CM | POA: Diagnosis not present

## 2020-10-02 DIAGNOSIS — Z299 Encounter for prophylactic measures, unspecified: Secondary | ICD-10-CM | POA: Diagnosis not present

## 2020-10-02 DIAGNOSIS — I10 Essential (primary) hypertension: Secondary | ICD-10-CM | POA: Diagnosis not present

## 2020-10-02 DIAGNOSIS — Z6823 Body mass index (BMI) 23.0-23.9, adult: Secondary | ICD-10-CM | POA: Diagnosis not present

## 2020-10-02 DIAGNOSIS — F17210 Nicotine dependence, cigarettes, uncomplicated: Secondary | ICD-10-CM | POA: Diagnosis not present

## 2020-10-02 DIAGNOSIS — F909 Attention-deficit hyperactivity disorder, unspecified type: Secondary | ICD-10-CM | POA: Diagnosis not present

## 2020-10-02 DIAGNOSIS — F1911 Other psychoactive substance abuse, in remission: Secondary | ICD-10-CM | POA: Diagnosis not present

## 2020-10-06 ENCOUNTER — Encounter (HOSPITAL_BASED_OUTPATIENT_CLINIC_OR_DEPARTMENT_OTHER): Payer: PPO | Admitting: Internal Medicine

## 2020-10-07 DIAGNOSIS — I69354 Hemiplegia and hemiparesis following cerebral infarction affecting left non-dominant side: Secondary | ICD-10-CM | POA: Diagnosis not present

## 2020-10-07 DIAGNOSIS — Z299 Encounter for prophylactic measures, unspecified: Secondary | ICD-10-CM | POA: Diagnosis not present

## 2020-10-07 DIAGNOSIS — I672 Cerebral atherosclerosis: Secondary | ICD-10-CM | POA: Diagnosis not present

## 2020-10-07 DIAGNOSIS — F909 Attention-deficit hyperactivity disorder, unspecified type: Secondary | ICD-10-CM | POA: Diagnosis not present

## 2020-10-07 DIAGNOSIS — Z8673 Personal history of transient ischemic attack (TIA), and cerebral infarction without residual deficits: Secondary | ICD-10-CM | POA: Diagnosis not present

## 2020-10-13 DIAGNOSIS — I69354 Hemiplegia and hemiparesis following cerebral infarction affecting left non-dominant side: Secondary | ICD-10-CM | POA: Diagnosis not present

## 2020-10-13 DIAGNOSIS — Z299 Encounter for prophylactic measures, unspecified: Secondary | ICD-10-CM | POA: Diagnosis not present

## 2020-10-13 DIAGNOSIS — F909 Attention-deficit hyperactivity disorder, unspecified type: Secondary | ICD-10-CM | POA: Diagnosis not present

## 2020-10-13 DIAGNOSIS — I10 Essential (primary) hypertension: Secondary | ICD-10-CM | POA: Diagnosis not present

## 2020-10-24 DIAGNOSIS — I712 Thoracic aortic aneurysm, without rupture: Secondary | ICD-10-CM | POA: Diagnosis not present

## 2020-10-24 DIAGNOSIS — Z299 Encounter for prophylactic measures, unspecified: Secondary | ICD-10-CM | POA: Diagnosis not present

## 2020-10-24 DIAGNOSIS — F909 Attention-deficit hyperactivity disorder, unspecified type: Secondary | ICD-10-CM | POA: Diagnosis not present

## 2020-10-24 DIAGNOSIS — F17210 Nicotine dependence, cigarettes, uncomplicated: Secondary | ICD-10-CM | POA: Diagnosis not present

## 2020-10-24 DIAGNOSIS — R569 Unspecified convulsions: Secondary | ICD-10-CM | POA: Diagnosis not present

## 2020-10-24 DIAGNOSIS — I7101 Dissection of thoracic aorta: Secondary | ICD-10-CM | POA: Diagnosis not present

## 2020-11-06 DIAGNOSIS — I10 Essential (primary) hypertension: Secondary | ICD-10-CM | POA: Diagnosis not present

## 2020-11-06 DIAGNOSIS — Z8679 Personal history of other diseases of the circulatory system: Secondary | ICD-10-CM | POA: Diagnosis not present

## 2020-11-06 DIAGNOSIS — K2100 Gastro-esophageal reflux disease with esophagitis, without bleeding: Secondary | ICD-10-CM | POA: Diagnosis not present

## 2020-11-06 DIAGNOSIS — K551 Chronic vascular disorders of intestine: Secondary | ICD-10-CM | POA: Diagnosis not present

## 2020-11-06 DIAGNOSIS — R0689 Other abnormalities of breathing: Secondary | ICD-10-CM | POA: Diagnosis not present

## 2020-11-06 DIAGNOSIS — J439 Emphysema, unspecified: Secondary | ICD-10-CM | POA: Diagnosis not present

## 2020-11-06 DIAGNOSIS — I7411 Embolism and thrombosis of thoracic aorta: Secondary | ICD-10-CM | POA: Diagnosis not present

## 2020-11-06 DIAGNOSIS — J9811 Atelectasis: Secondary | ICD-10-CM | POA: Diagnosis not present

## 2020-11-06 DIAGNOSIS — R079 Chest pain, unspecified: Secondary | ICD-10-CM | POA: Diagnosis not present

## 2020-11-06 DIAGNOSIS — R0789 Other chest pain: Secondary | ICD-10-CM | POA: Diagnosis not present

## 2020-11-06 DIAGNOSIS — R000 Tachycardia, unspecified: Secondary | ICD-10-CM | POA: Diagnosis not present

## 2020-11-06 DIAGNOSIS — Z20822 Contact with and (suspected) exposure to covid-19: Secondary | ICD-10-CM | POA: Diagnosis not present

## 2020-11-06 DIAGNOSIS — Z8673 Personal history of transient ischemic attack (TIA), and cerebral infarction without residual deficits: Secondary | ICD-10-CM | POA: Diagnosis not present

## 2020-11-06 DIAGNOSIS — I700 Atherosclerosis of aorta: Secondary | ICD-10-CM | POA: Diagnosis not present

## 2020-11-06 DIAGNOSIS — D649 Anemia, unspecified: Secondary | ICD-10-CM | POA: Diagnosis not present

## 2020-11-06 DIAGNOSIS — Z952 Presence of prosthetic heart valve: Secondary | ICD-10-CM | POA: Diagnosis not present

## 2020-11-06 DIAGNOSIS — I712 Thoracic aortic aneurysm, without rupture: Secondary | ICD-10-CM | POA: Diagnosis not present

## 2020-11-06 DIAGNOSIS — R9431 Abnormal electrocardiogram [ECG] [EKG]: Secondary | ICD-10-CM | POA: Diagnosis not present

## 2020-11-06 DIAGNOSIS — Z9889 Other specified postprocedural states: Secondary | ICD-10-CM | POA: Diagnosis not present

## 2020-11-06 DIAGNOSIS — F17210 Nicotine dependence, cigarettes, uncomplicated: Secondary | ICD-10-CM | POA: Diagnosis not present

## 2020-11-08 DIAGNOSIS — I499 Cardiac arrhythmia, unspecified: Secondary | ICD-10-CM | POA: Diagnosis not present

## 2020-11-08 DIAGNOSIS — Z720 Tobacco use: Secondary | ICD-10-CM | POA: Diagnosis not present

## 2020-11-08 DIAGNOSIS — F909 Attention-deficit hyperactivity disorder, unspecified type: Secondary | ICD-10-CM | POA: Diagnosis not present

## 2020-11-08 DIAGNOSIS — R5381 Other malaise: Secondary | ICD-10-CM | POA: Diagnosis not present

## 2020-11-08 DIAGNOSIS — R569 Unspecified convulsions: Secondary | ICD-10-CM | POA: Diagnosis not present

## 2020-11-08 DIAGNOSIS — F4321 Adjustment disorder with depressed mood: Secondary | ICD-10-CM | POA: Diagnosis not present

## 2020-11-08 DIAGNOSIS — F1024 Alcohol dependence with alcohol-induced mood disorder: Secondary | ICD-10-CM | POA: Diagnosis not present

## 2020-11-08 DIAGNOSIS — I7102 Dissection of abdominal aorta: Secondary | ICD-10-CM | POA: Diagnosis not present

## 2020-11-08 DIAGNOSIS — F17210 Nicotine dependence, cigarettes, uncomplicated: Secondary | ICD-10-CM | POA: Diagnosis not present

## 2020-11-08 DIAGNOSIS — I872 Venous insufficiency (chronic) (peripheral): Secondary | ICD-10-CM | POA: Diagnosis not present

## 2020-11-08 DIAGNOSIS — I4949 Other premature depolarization: Secondary | ICD-10-CM | POA: Diagnosis not present

## 2020-11-08 DIAGNOSIS — I69354 Hemiplegia and hemiparesis following cerebral infarction affecting left non-dominant side: Secondary | ICD-10-CM | POA: Diagnosis not present

## 2020-11-08 DIAGNOSIS — D62 Acute posthemorrhagic anemia: Secondary | ICD-10-CM | POA: Diagnosis not present

## 2020-11-08 DIAGNOSIS — I10 Essential (primary) hypertension: Secondary | ICD-10-CM | POA: Diagnosis not present

## 2020-11-08 DIAGNOSIS — I77810 Thoracic aortic ectasia: Secondary | ICD-10-CM | POA: Diagnosis not present

## 2020-11-08 DIAGNOSIS — K2100 Gastro-esophageal reflux disease with esophagitis, without bleeding: Secondary | ICD-10-CM | POA: Diagnosis not present

## 2020-11-08 DIAGNOSIS — R918 Other nonspecific abnormal finding of lung field: Secondary | ICD-10-CM | POA: Diagnosis not present

## 2020-11-08 DIAGNOSIS — R279 Unspecified lack of coordination: Secondary | ICD-10-CM | POA: Diagnosis not present

## 2020-11-08 DIAGNOSIS — F319 Bipolar disorder, unspecified: Secondary | ICD-10-CM | POA: Diagnosis not present

## 2020-11-08 DIAGNOSIS — Z8673 Personal history of transient ischemic attack (TIA), and cerebral infarction without residual deficits: Secondary | ICD-10-CM | POA: Diagnosis not present

## 2020-11-08 DIAGNOSIS — Z23 Encounter for immunization: Secondary | ICD-10-CM | POA: Diagnosis not present

## 2020-11-08 DIAGNOSIS — Z79899 Other long term (current) drug therapy: Secondary | ICD-10-CM | POA: Diagnosis not present

## 2020-11-08 DIAGNOSIS — Z742 Need for assistance at home and no other household member able to render care: Secondary | ICD-10-CM | POA: Diagnosis not present

## 2020-11-08 DIAGNOSIS — I712 Thoracic aortic aneurysm, without rupture: Secondary | ICD-10-CM | POA: Diagnosis not present

## 2020-11-08 DIAGNOSIS — I82611 Acute embolism and thrombosis of superficial veins of right upper extremity: Secondary | ICD-10-CM | POA: Diagnosis not present

## 2020-11-08 DIAGNOSIS — G243 Spasmodic torticollis: Secondary | ICD-10-CM | POA: Diagnosis not present

## 2020-11-08 DIAGNOSIS — R0902 Hypoxemia: Secondary | ICD-10-CM | POA: Diagnosis not present

## 2020-11-08 DIAGNOSIS — G8929 Other chronic pain: Secondary | ICD-10-CM | POA: Diagnosis not present

## 2020-11-08 DIAGNOSIS — M62838 Other muscle spasm: Secondary | ICD-10-CM | POA: Diagnosis not present

## 2020-11-08 DIAGNOSIS — I890 Lymphedema, not elsewhere classified: Secondary | ICD-10-CM | POA: Diagnosis not present

## 2020-11-08 DIAGNOSIS — F901 Attention-deficit hyperactivity disorder, predominantly hyperactive type: Secondary | ICD-10-CM | POA: Diagnosis not present

## 2020-11-08 DIAGNOSIS — R079 Chest pain, unspecified: Secondary | ICD-10-CM | POA: Diagnosis not present

## 2020-11-08 DIAGNOSIS — F1420 Cocaine dependence, uncomplicated: Secondary | ICD-10-CM | POA: Diagnosis not present

## 2020-11-08 DIAGNOSIS — D72829 Elevated white blood cell count, unspecified: Secondary | ICD-10-CM | POA: Diagnosis not present

## 2020-11-08 DIAGNOSIS — I7772 Dissection of iliac artery: Secondary | ICD-10-CM | POA: Diagnosis not present

## 2020-11-08 DIAGNOSIS — Z9889 Other specified postprocedural states: Secondary | ICD-10-CM | POA: Diagnosis not present

## 2020-11-08 DIAGNOSIS — Z952 Presence of prosthetic heart valve: Secondary | ICD-10-CM | POA: Diagnosis not present

## 2020-11-08 DIAGNOSIS — R262 Difficulty in walking, not elsewhere classified: Secondary | ICD-10-CM | POA: Diagnosis not present

## 2020-11-08 DIAGNOSIS — Z609 Problem related to social environment, unspecified: Secondary | ICD-10-CM | POA: Diagnosis not present

## 2020-11-08 DIAGNOSIS — M6281 Muscle weakness (generalized): Secondary | ICD-10-CM | POA: Diagnosis not present

## 2020-11-08 DIAGNOSIS — J439 Emphysema, unspecified: Secondary | ICD-10-CM | POA: Diagnosis not present

## 2020-11-08 DIAGNOSIS — F332 Major depressive disorder, recurrent severe without psychotic features: Secondary | ICD-10-CM | POA: Diagnosis not present

## 2020-11-08 DIAGNOSIS — Z20822 Contact with and (suspected) exposure to covid-19: Secondary | ICD-10-CM | POA: Diagnosis not present

## 2020-11-08 DIAGNOSIS — I7101 Dissection of thoracic aorta: Secondary | ICD-10-CM | POA: Diagnosis not present

## 2020-11-08 DIAGNOSIS — K219 Gastro-esophageal reflux disease without esophagitis: Secondary | ICD-10-CM | POA: Diagnosis not present

## 2020-11-08 DIAGNOSIS — M542 Cervicalgia: Secondary | ICD-10-CM | POA: Diagnosis not present

## 2020-11-08 DIAGNOSIS — I723 Aneurysm of iliac artery: Secondary | ICD-10-CM | POA: Diagnosis not present

## 2020-11-09 DIAGNOSIS — I712 Thoracic aortic aneurysm, without rupture: Secondary | ICD-10-CM | POA: Diagnosis not present

## 2020-11-09 DIAGNOSIS — Z9889 Other specified postprocedural states: Secondary | ICD-10-CM | POA: Diagnosis not present

## 2020-11-09 DIAGNOSIS — I7101 Dissection of thoracic aorta: Secondary | ICD-10-CM | POA: Diagnosis not present

## 2020-11-09 DIAGNOSIS — I82611 Acute embolism and thrombosis of superficial veins of right upper extremity: Secondary | ICD-10-CM | POA: Diagnosis not present

## 2020-11-09 DIAGNOSIS — I499 Cardiac arrhythmia, unspecified: Secondary | ICD-10-CM | POA: Diagnosis not present

## 2020-11-09 DIAGNOSIS — R079 Chest pain, unspecified: Secondary | ICD-10-CM | POA: Diagnosis not present

## 2020-11-10 DIAGNOSIS — I712 Thoracic aortic aneurysm, without rupture: Secondary | ICD-10-CM | POA: Diagnosis not present

## 2020-11-10 DIAGNOSIS — Z952 Presence of prosthetic heart valve: Secondary | ICD-10-CM | POA: Diagnosis not present

## 2020-11-10 DIAGNOSIS — D62 Acute posthemorrhagic anemia: Secondary | ICD-10-CM | POA: Diagnosis not present

## 2020-11-10 DIAGNOSIS — Z9889 Other specified postprocedural states: Secondary | ICD-10-CM | POA: Diagnosis not present

## 2020-11-10 DIAGNOSIS — I77810 Thoracic aortic ectasia: Secondary | ICD-10-CM | POA: Diagnosis not present

## 2020-11-10 DIAGNOSIS — I7101 Dissection of thoracic aorta: Secondary | ICD-10-CM | POA: Diagnosis not present

## 2020-11-10 DIAGNOSIS — I10 Essential (primary) hypertension: Secondary | ICD-10-CM | POA: Diagnosis not present

## 2020-11-11 DIAGNOSIS — I7101 Dissection of thoracic aorta: Secondary | ICD-10-CM | POA: Diagnosis not present

## 2020-11-11 DIAGNOSIS — Z9889 Other specified postprocedural states: Secondary | ICD-10-CM | POA: Diagnosis not present

## 2020-11-13 DIAGNOSIS — F17210 Nicotine dependence, cigarettes, uncomplicated: Secondary | ICD-10-CM | POA: Diagnosis not present

## 2020-11-14 DIAGNOSIS — I7101 Dissection of thoracic aorta: Secondary | ICD-10-CM | POA: Diagnosis not present

## 2020-11-27 DIAGNOSIS — F339 Major depressive disorder, recurrent, unspecified: Secondary | ICD-10-CM | POA: Diagnosis not present

## 2020-11-27 DIAGNOSIS — R279 Unspecified lack of coordination: Secondary | ICD-10-CM | POA: Diagnosis not present

## 2020-11-27 DIAGNOSIS — I7101 Dissection of thoracic aorta: Secondary | ICD-10-CM | POA: Diagnosis not present

## 2020-11-27 DIAGNOSIS — R339 Retention of urine, unspecified: Secondary | ICD-10-CM | POA: Diagnosis not present

## 2020-11-27 DIAGNOSIS — S73192A Other sprain of left hip, initial encounter: Secondary | ICD-10-CM | POA: Diagnosis not present

## 2020-11-27 DIAGNOSIS — K2100 Gastro-esophageal reflux disease with esophagitis, without bleeding: Secondary | ICD-10-CM | POA: Diagnosis not present

## 2020-11-27 DIAGNOSIS — S42212A Unspecified displaced fracture of surgical neck of left humerus, initial encounter for closed fracture: Secondary | ICD-10-CM | POA: Diagnosis not present

## 2020-11-27 DIAGNOSIS — Z951 Presence of aortocoronary bypass graft: Secondary | ICD-10-CM | POA: Diagnosis not present

## 2020-11-27 DIAGNOSIS — M79605 Pain in left leg: Secondary | ICD-10-CM | POA: Diagnosis not present

## 2020-11-27 DIAGNOSIS — R509 Fever, unspecified: Secondary | ICD-10-CM | POA: Diagnosis not present

## 2020-11-27 DIAGNOSIS — F1024 Alcohol dependence with alcohol-induced mood disorder: Secondary | ICD-10-CM | POA: Diagnosis not present

## 2020-11-27 DIAGNOSIS — M62838 Other muscle spasm: Secondary | ICD-10-CM | POA: Diagnosis not present

## 2020-11-27 DIAGNOSIS — M25552 Pain in left hip: Secondary | ICD-10-CM | POA: Diagnosis not present

## 2020-11-27 DIAGNOSIS — R569 Unspecified convulsions: Secondary | ICD-10-CM | POA: Diagnosis not present

## 2020-11-27 DIAGNOSIS — R079 Chest pain, unspecified: Secondary | ICD-10-CM | POA: Diagnosis not present

## 2020-11-27 DIAGNOSIS — I69354 Hemiplegia and hemiparesis following cerebral infarction affecting left non-dominant side: Secondary | ICD-10-CM | POA: Diagnosis not present

## 2020-11-27 DIAGNOSIS — F900 Attention-deficit hyperactivity disorder, predominantly inattentive type: Secondary | ICD-10-CM | POA: Diagnosis not present

## 2020-11-27 DIAGNOSIS — L03114 Cellulitis of left upper limb: Secondary | ICD-10-CM | POA: Diagnosis not present

## 2020-11-27 DIAGNOSIS — Z720 Tobacco use: Secondary | ICD-10-CM | POA: Diagnosis not present

## 2020-11-27 DIAGNOSIS — S99912A Unspecified injury of left ankle, initial encounter: Secondary | ICD-10-CM | POA: Diagnosis not present

## 2020-11-27 DIAGNOSIS — Z8673 Personal history of transient ischemic attack (TIA), and cerebral infarction without residual deficits: Secondary | ICD-10-CM | POA: Diagnosis not present

## 2020-11-27 DIAGNOSIS — F1420 Cocaine dependence, uncomplicated: Secondary | ICD-10-CM | POA: Diagnosis not present

## 2020-11-27 DIAGNOSIS — I10 Essential (primary) hypertension: Secondary | ICD-10-CM | POA: Diagnosis not present

## 2020-11-27 DIAGNOSIS — K219 Gastro-esophageal reflux disease without esophagitis: Secondary | ICD-10-CM | POA: Diagnosis not present

## 2020-11-27 DIAGNOSIS — F5101 Primary insomnia: Secondary | ICD-10-CM | POA: Diagnosis not present

## 2020-11-27 DIAGNOSIS — F1010 Alcohol abuse, uncomplicated: Secondary | ICD-10-CM | POA: Diagnosis not present

## 2020-11-27 DIAGNOSIS — Z23 Encounter for immunization: Secondary | ICD-10-CM | POA: Diagnosis not present

## 2020-11-27 DIAGNOSIS — R262 Difficulty in walking, not elsewhere classified: Secondary | ICD-10-CM | POA: Diagnosis not present

## 2020-11-27 DIAGNOSIS — Z952 Presence of prosthetic heart valve: Secondary | ICD-10-CM | POA: Diagnosis not present

## 2020-11-27 DIAGNOSIS — Z7982 Long term (current) use of aspirin: Secondary | ICD-10-CM | POA: Diagnosis not present

## 2020-11-27 DIAGNOSIS — S7292XA Unspecified fracture of left femur, initial encounter for closed fracture: Secondary | ICD-10-CM | POA: Diagnosis not present

## 2020-11-27 DIAGNOSIS — R5381 Other malaise: Secondary | ICD-10-CM | POA: Diagnosis not present

## 2020-11-27 DIAGNOSIS — M25562 Pain in left knee: Secondary | ICD-10-CM | POA: Diagnosis not present

## 2020-11-27 DIAGNOSIS — S72112A Displaced fracture of greater trochanter of left femur, initial encounter for closed fracture: Secondary | ICD-10-CM | POA: Diagnosis not present

## 2020-11-27 DIAGNOSIS — M6281 Muscle weakness (generalized): Secondary | ICD-10-CM | POA: Diagnosis not present

## 2020-11-27 DIAGNOSIS — F1911 Other psychoactive substance abuse, in remission: Secondary | ICD-10-CM | POA: Diagnosis not present

## 2020-11-27 DIAGNOSIS — Z79899 Other long term (current) drug therapy: Secondary | ICD-10-CM | POA: Diagnosis not present

## 2020-11-27 DIAGNOSIS — F901 Attention-deficit hyperactivity disorder, predominantly hyperactive type: Secondary | ICD-10-CM | POA: Diagnosis not present

## 2020-11-27 DIAGNOSIS — F17210 Nicotine dependence, cigarettes, uncomplicated: Secondary | ICD-10-CM | POA: Diagnosis not present

## 2020-11-27 DIAGNOSIS — F4321 Adjustment disorder with depressed mood: Secondary | ICD-10-CM | POA: Diagnosis not present

## 2020-11-27 DIAGNOSIS — S72115A Nondisplaced fracture of greater trochanter of left femur, initial encounter for closed fracture: Secondary | ICD-10-CM | POA: Diagnosis not present

## 2020-11-27 DIAGNOSIS — F332 Major depressive disorder, recurrent severe without psychotic features: Secondary | ICD-10-CM | POA: Diagnosis not present

## 2020-11-28 DIAGNOSIS — I7101 Dissection of thoracic aorta: Secondary | ICD-10-CM | POA: Diagnosis not present

## 2020-11-28 DIAGNOSIS — I10 Essential (primary) hypertension: Secondary | ICD-10-CM | POA: Diagnosis not present

## 2020-11-28 DIAGNOSIS — M6281 Muscle weakness (generalized): Secondary | ICD-10-CM | POA: Diagnosis not present

## 2020-11-28 DIAGNOSIS — R569 Unspecified convulsions: Secondary | ICD-10-CM | POA: Diagnosis not present

## 2020-11-28 DIAGNOSIS — F332 Major depressive disorder, recurrent severe without psychotic features: Secondary | ICD-10-CM | POA: Diagnosis not present

## 2020-12-03 DIAGNOSIS — F5101 Primary insomnia: Secondary | ICD-10-CM | POA: Diagnosis not present

## 2020-12-03 DIAGNOSIS — F1911 Other psychoactive substance abuse, in remission: Secondary | ICD-10-CM | POA: Diagnosis not present

## 2020-12-03 DIAGNOSIS — F900 Attention-deficit hyperactivity disorder, predominantly inattentive type: Secondary | ICD-10-CM | POA: Diagnosis not present

## 2020-12-03 DIAGNOSIS — F339 Major depressive disorder, recurrent, unspecified: Secondary | ICD-10-CM | POA: Diagnosis not present

## 2020-12-04 DIAGNOSIS — Z23 Encounter for immunization: Secondary | ICD-10-CM | POA: Diagnosis not present

## 2020-12-31 DIAGNOSIS — F1911 Other psychoactive substance abuse, in remission: Secondary | ICD-10-CM | POA: Diagnosis not present

## 2020-12-31 DIAGNOSIS — F900 Attention-deficit hyperactivity disorder, predominantly inattentive type: Secondary | ICD-10-CM | POA: Diagnosis not present

## 2020-12-31 DIAGNOSIS — F339 Major depressive disorder, recurrent, unspecified: Secondary | ICD-10-CM | POA: Diagnosis not present

## 2020-12-31 DIAGNOSIS — F5101 Primary insomnia: Secondary | ICD-10-CM | POA: Diagnosis not present

## 2021-01-04 DIAGNOSIS — R569 Unspecified convulsions: Secondary | ICD-10-CM | POA: Diagnosis not present

## 2021-01-04 DIAGNOSIS — Z951 Presence of aortocoronary bypass graft: Secondary | ICD-10-CM | POA: Diagnosis not present

## 2021-01-04 DIAGNOSIS — Z7982 Long term (current) use of aspirin: Secondary | ICD-10-CM | POA: Diagnosis not present

## 2021-01-04 DIAGNOSIS — M25552 Pain in left hip: Secondary | ICD-10-CM | POA: Diagnosis not present

## 2021-01-04 DIAGNOSIS — Z79899 Other long term (current) drug therapy: Secondary | ICD-10-CM | POA: Diagnosis not present

## 2021-01-04 DIAGNOSIS — S72115A Nondisplaced fracture of greater trochanter of left femur, initial encounter for closed fracture: Secondary | ICD-10-CM | POA: Diagnosis not present

## 2021-01-04 DIAGNOSIS — F1010 Alcohol abuse, uncomplicated: Secondary | ICD-10-CM | POA: Diagnosis not present

## 2021-01-04 DIAGNOSIS — I69354 Hemiplegia and hemiparesis following cerebral infarction affecting left non-dominant side: Secondary | ICD-10-CM | POA: Diagnosis not present

## 2021-01-04 DIAGNOSIS — K2100 Gastro-esophageal reflux disease with esophagitis, without bleeding: Secondary | ICD-10-CM | POA: Diagnosis not present

## 2021-01-04 DIAGNOSIS — F17210 Nicotine dependence, cigarettes, uncomplicated: Secondary | ICD-10-CM | POA: Diagnosis not present

## 2021-01-04 DIAGNOSIS — I10 Essential (primary) hypertension: Secondary | ICD-10-CM | POA: Diagnosis not present

## 2021-01-04 DIAGNOSIS — S73192A Other sprain of left hip, initial encounter: Secondary | ICD-10-CM | POA: Diagnosis not present

## 2021-01-04 DIAGNOSIS — S72112A Displaced fracture of greater trochanter of left femur, initial encounter for closed fracture: Secondary | ICD-10-CM | POA: Diagnosis not present

## 2021-01-04 DIAGNOSIS — M79605 Pain in left leg: Secondary | ICD-10-CM | POA: Diagnosis not present

## 2021-01-04 DIAGNOSIS — S99912A Unspecified injury of left ankle, initial encounter: Secondary | ICD-10-CM | POA: Diagnosis not present

## 2021-01-04 DIAGNOSIS — M25562 Pain in left knee: Secondary | ICD-10-CM | POA: Diagnosis not present

## 2021-01-04 DIAGNOSIS — Z952 Presence of prosthetic heart valve: Secondary | ICD-10-CM | POA: Diagnosis not present

## 2021-01-05 DIAGNOSIS — S72112A Displaced fracture of greater trochanter of left femur, initial encounter for closed fracture: Secondary | ICD-10-CM | POA: Diagnosis not present

## 2021-01-05 DIAGNOSIS — S73192A Other sprain of left hip, initial encounter: Secondary | ICD-10-CM | POA: Diagnosis not present

## 2021-01-05 DIAGNOSIS — S72115A Nondisplaced fracture of greater trochanter of left femur, initial encounter for closed fracture: Secondary | ICD-10-CM | POA: Diagnosis not present

## 2021-01-05 DIAGNOSIS — R5381 Other malaise: Secondary | ICD-10-CM | POA: Diagnosis not present

## 2021-01-05 DIAGNOSIS — R279 Unspecified lack of coordination: Secondary | ICD-10-CM | POA: Diagnosis not present

## 2021-01-05 DIAGNOSIS — M6281 Muscle weakness (generalized): Secondary | ICD-10-CM | POA: Diagnosis not present

## 2021-01-08 DIAGNOSIS — I10 Essential (primary) hypertension: Secondary | ICD-10-CM | POA: Diagnosis not present

## 2021-01-08 DIAGNOSIS — R339 Retention of urine, unspecified: Secondary | ICD-10-CM | POA: Diagnosis not present

## 2021-01-08 DIAGNOSIS — S42212A Unspecified displaced fracture of surgical neck of left humerus, initial encounter for closed fracture: Secondary | ICD-10-CM | POA: Diagnosis not present

## 2021-01-08 DIAGNOSIS — R509 Fever, unspecified: Secondary | ICD-10-CM | POA: Diagnosis not present

## 2021-01-08 DIAGNOSIS — I69354 Hemiplegia and hemiparesis following cerebral infarction affecting left non-dominant side: Secondary | ICD-10-CM | POA: Diagnosis not present

## 2021-01-08 DIAGNOSIS — L03114 Cellulitis of left upper limb: Secondary | ICD-10-CM | POA: Diagnosis not present

## 2021-01-08 DIAGNOSIS — R569 Unspecified convulsions: Secondary | ICD-10-CM | POA: Diagnosis not present

## 2021-01-20 DIAGNOSIS — S72112A Displaced fracture of greater trochanter of left femur, initial encounter for closed fracture: Secondary | ICD-10-CM | POA: Diagnosis not present

## 2021-01-20 DIAGNOSIS — S7292XA Unspecified fracture of left femur, initial encounter for closed fracture: Secondary | ICD-10-CM | POA: Diagnosis not present

## 2021-01-28 DIAGNOSIS — F5101 Primary insomnia: Secondary | ICD-10-CM | POA: Diagnosis not present

## 2021-01-28 DIAGNOSIS — F1911 Other psychoactive substance abuse, in remission: Secondary | ICD-10-CM | POA: Diagnosis not present

## 2021-01-28 DIAGNOSIS — F339 Major depressive disorder, recurrent, unspecified: Secondary | ICD-10-CM | POA: Diagnosis not present

## 2021-01-28 DIAGNOSIS — F900 Attention-deficit hyperactivity disorder, predominantly inattentive type: Secondary | ICD-10-CM | POA: Diagnosis not present

## 2021-02-03 DIAGNOSIS — R279 Unspecified lack of coordination: Secondary | ICD-10-CM | POA: Diagnosis not present

## 2021-02-03 DIAGNOSIS — R569 Unspecified convulsions: Secondary | ICD-10-CM | POA: Diagnosis not present

## 2021-02-03 DIAGNOSIS — I69354 Hemiplegia and hemiparesis following cerebral infarction affecting left non-dominant side: Secondary | ICD-10-CM | POA: Diagnosis not present

## 2021-02-03 DIAGNOSIS — I7101 Dissection of thoracic aorta: Secondary | ICD-10-CM | POA: Diagnosis not present

## 2021-02-11 DIAGNOSIS — I7101 Dissection of thoracic aorta: Secondary | ICD-10-CM | POA: Diagnosis not present

## 2021-02-11 DIAGNOSIS — Z299 Encounter for prophylactic measures, unspecified: Secondary | ICD-10-CM | POA: Diagnosis not present

## 2021-02-11 DIAGNOSIS — I712 Thoracic aortic aneurysm, without rupture: Secondary | ICD-10-CM | POA: Diagnosis not present

## 2021-02-11 DIAGNOSIS — I10 Essential (primary) hypertension: Secondary | ICD-10-CM | POA: Diagnosis not present

## 2021-02-11 DIAGNOSIS — Z789 Other specified health status: Secondary | ICD-10-CM | POA: Diagnosis not present

## 2021-02-23 ENCOUNTER — Telehealth (INDEPENDENT_AMBULATORY_CARE_PROVIDER_SITE_OTHER): Payer: PPO | Admitting: Neurology

## 2021-02-23 ENCOUNTER — Encounter: Payer: Self-pay | Admitting: Neurology

## 2021-02-23 ENCOUNTER — Other Ambulatory Visit: Payer: Self-pay

## 2021-02-23 VITALS — Ht 72.0 in | Wt 160.0 lb

## 2021-02-23 DIAGNOSIS — G8110 Spastic hemiplegia affecting unspecified side: Secondary | ICD-10-CM | POA: Diagnosis not present

## 2021-02-23 DIAGNOSIS — Z8673 Personal history of transient ischemic attack (TIA), and cerebral infarction without residual deficits: Secondary | ICD-10-CM | POA: Diagnosis not present

## 2021-02-23 DIAGNOSIS — S72002S Fracture of unspecified part of neck of left femur, sequela: Secondary | ICD-10-CM

## 2021-02-23 DIAGNOSIS — G40009 Localization-related (focal) (partial) idiopathic epilepsy and epileptic syndromes with seizures of localized onset, not intractable, without status epilepticus: Secondary | ICD-10-CM | POA: Diagnosis not present

## 2021-02-23 NOTE — Progress Notes (Signed)
Virtual Visit via Video Note The purpose of this virtual visit is to provide medical care while limiting exposure to the novel coronavirus.    Consent was obtained for video visit:  Yes.   Answered questions that patient had about telehealth interaction:  Yes.   I discussed the limitations, risks, security and privacy concerns of performing an evaluation and management service by telemedicine. I also discussed with the patient that there may be a patient responsible charge related to this service. The patient expressed understanding and agreed to proceed.  Pt location: Home Physician Location: office Name of referring provider:  Salley Slaughter, NP I connected with Daniel Arroyo at patients initiation/request on 02/23/2021 at  4:00 PM EDT by video enabled telemedicine application and verified that I am speaking with the correct person using two identifiers. Pt MRN:  [id number] Pt DOB:  09/26/1970 Video Participants:  Daniel Arroyo   History of Present Illness:  The patient was seen as a virtual video visit on 02/23/2021. 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 He reports doing well from a seizure standpoint, no loss of consciousness since 05/2019, no focal motor seizures in 2 years since increasing Depakote to 250mg  in AM, 500mg  in PM. He lives with a roommate who has not witnessed any staring/unresponsive episodes. Since his last visit, he was admitted at [facility] in 08/2020 for aortic dissection when he presented with severe back pain. 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 He was discharged to a long-term facility in March, then had a fall on 01/05/21 sustaining a left nondisplaced greater trochanter femur fracture. No surgical intervention was indicated, he sees Ortho tomorrow. He still feels sore and states there is always some level of pain. He reports that  since he got out of the hospital, baseline left-sided weakness was magnified. He could not stand when he was initially discharged to the SNF. He states he still has not heard from home PT and OT. He needs minor assistance around the house. He has not seen Dr. April since his hospitalizations.    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 Per notes, stroke was cryptogenic. He did endorse cocaine use around that time. He started having focal seizures in September 2019 where he describes left arm clonic activity that he cannot control. He has clonus but states the clonic activity is different. Face and leg are not affected. They last around 5 minutes, no clear worsened weakness or worsened numbness after.  He notices it more when he gets frustrated, stressed, or is struggling/straining with something. He usually takes a deep breath and the clonic activity would calm down. This was going on intermittently for a year until September 2020 while he was home alone, he started having the left arm clonic activity but this time he lost consciousness. He apparently grabbed his TV and the TV fell on top of him, fracturing his left arm. He woke up on the ground, no tongue bite or incontinence, and was brought to [facility]. He was told there was no new stroke. His PCP started him on  low dose Depakote which initially quieted down the clonic activity, then again recurred when he would get frustrated. He had a Depakote level which was subtherapeutic, and dose was increased to 250mg  BID a couple of weeks ago. He denies any side effects. He denies any other episodes of gaps in time, olfactory/gustatory  hallucinations. His father denies any staring/unresponsive episodes. He lives independently with family close by. He states his sleep is horrible and has not noticed any relation of seizures to poor sleep. He denies any alcohol intake in over a year, no illicit drug use since the stroke. He has been doing PT for his left arm fracture and notices some pain when his left arm is extended, but there is typically no pain. He is concerned that up until May/June 2020, he was more independent, walking daily to [place], but over the past few months his stamina and whole physical ability have decreased rapidly. He wonders about Botox helping with spasticity. He has also been dealing with bilateral lymphedema in his legs since late Spring/early Summer, with a wound on his right foot. He denies any headaches, dizziness, diplopia, dysphagia, back pain. He has neck pain and difficulty holding his head up. He has constipation due to Suboxone.   Epilepsy Risk Factors:  History of right MCA stroke. Otherwise he had a normal birth and early development.  There is no history of febrile convulsions, CNS infections such as meningitis/encephalitis, significant traumatic brain injury, neurosurgical procedures, or family history of seizures.  Diagnostic Data: His 1-hour wake and asleep EEG done 08/2019 was abnormal due to occasional focal slowing over the right hemisphere.    Current Outpatient Medications on File Prior to Visit  Medication Sig Dispense Refill  . albuterol (VENTOLIN HFA) 108 (90 Base) MCG/ACT inhaler As directed    . amphetamine-dextroamphetamine (ADDERALL XR) 30 MG 24 hr capsule Take 30 mg by mouth in the morning and at bedtime.    . cyclobenzaprine (FLEXERIL) 10 MG tablet Take 10 mg by mouth daily as needed.    . divalproex (DEPAKOTE) 250 MG DR tablet Take 1 tab in AM, 2 tabs in PM 90 tablet 11  . escitalopram (LEXAPRO) 10 MG tablet Take 10 mg by mouth daily.    . fluticasone (FLONASE) 50 MCG/ACT nasal  spray Place 1 spray into both nostrils daily.    . furosemide (LASIX) 20 MG tablet TAKE 1 TABLET BY MOUTH DAILY 90 tablet 0  . omeprazole (PRILOSEC) 20 MG capsule Take 20 mg by mouth daily.    . tadalafil (CIALIS) 20 MG tablet Take 20 mg by mouth daily.    . temazepam (RESTORIL) 15 MG capsule Take 1 capsule by mouth at bedtime.    . mirtazapine (REMERON) 15 MG tablet Take 1 tablet (15 mg total) by mouth at bedtime. 30 tablet 0  . mirtazapine (REMERON) 30 MG tablet Take 30 mg by mouth at bedtime.     No current facility-administered medications on file prior to visit.     Observations/Objective:   Vitals:   02/23/21 1447  Weight: 160 lb (72.6 kg)  Height: 6' (1.829 m)   GEN:  The patient appears stated age and is in NAD.  Neurological examination: Patient is awake, alert. No aphasia or dysarthria. Intact fluency and comprehension. Remote and recent memory intact. Cranial nerves: Extraocular movements intact with no nystagmus. No facial asymmetry. Motor: spastic contracture on left UE with left hemiparesis.    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 No focal seizures for 2 years since increasing Depakote to 250mg  in AM, 500mg  in PM, no further loss of consciousness since 05/2019. He had surgery for aortic dissection in 08/2020 and a fall last 12/2020 with nondisplaced left hip fracture. He reports worsened left hemiparesis since hospitalization, he continues to await home PT and OT and would like our help for a referral. He does not drive. Follow-up in 1 year, call for any changes.    Follow Up Instructions:   -I discussed the assessment and treatment plan with the patient. The patient was provided an opportunity to ask questions and all were answered. The patient agreed with the plan and demonstrated an understanding of the  instructions.   The patient was advised to call back or seek an in-person evaluation if the symptoms worsen or if the condition fails to improve as anticipated.    09/2020, MD

## 2021-02-24 ENCOUNTER — Other Ambulatory Visit: Payer: Self-pay

## 2021-02-24 DIAGNOSIS — R531 Weakness: Secondary | ICD-10-CM

## 2021-03-02 DIAGNOSIS — F332 Major depressive disorder, recurrent severe without psychotic features: Secondary | ICD-10-CM | POA: Diagnosis not present

## 2021-03-02 DIAGNOSIS — I69354 Hemiplegia and hemiparesis following cerebral infarction affecting left non-dominant side: Secondary | ICD-10-CM | POA: Diagnosis not present

## 2021-03-02 DIAGNOSIS — F4321 Adjustment disorder with depressed mood: Secondary | ICD-10-CM | POA: Diagnosis not present

## 2021-03-02 DIAGNOSIS — F909 Attention-deficit hyperactivity disorder, unspecified type: Secondary | ICD-10-CM | POA: Diagnosis not present

## 2021-03-02 DIAGNOSIS — K2100 Gastro-esophageal reflux disease with esophagitis, without bleeding: Secondary | ICD-10-CM | POA: Diagnosis not present

## 2021-03-02 DIAGNOSIS — K4090 Unilateral inguinal hernia, without obstruction or gangrene, not specified as recurrent: Secondary | ICD-10-CM | POA: Diagnosis not present

## 2021-03-02 DIAGNOSIS — I10 Essential (primary) hypertension: Secondary | ICD-10-CM | POA: Diagnosis not present

## 2021-03-02 DIAGNOSIS — I872 Venous insufficiency (chronic) (peripheral): Secondary | ICD-10-CM | POA: Diagnosis not present

## 2021-03-02 DIAGNOSIS — R569 Unspecified convulsions: Secondary | ICD-10-CM | POA: Diagnosis not present

## 2021-03-02 DIAGNOSIS — G4700 Insomnia, unspecified: Secondary | ICD-10-CM | POA: Diagnosis not present

## 2021-03-02 DIAGNOSIS — M542 Cervicalgia: Secondary | ICD-10-CM | POA: Diagnosis not present

## 2021-03-02 DIAGNOSIS — M62838 Other muscle spasm: Secondary | ICD-10-CM | POA: Diagnosis not present

## 2021-03-02 DIAGNOSIS — R519 Headache, unspecified: Secondary | ICD-10-CM | POA: Diagnosis not present

## 2021-03-03 DIAGNOSIS — R569 Unspecified convulsions: Secondary | ICD-10-CM | POA: Diagnosis not present

## 2021-03-03 DIAGNOSIS — G2581 Restless legs syndrome: Secondary | ICD-10-CM | POA: Diagnosis not present

## 2021-03-03 DIAGNOSIS — F909 Attention-deficit hyperactivity disorder, unspecified type: Secondary | ICD-10-CM | POA: Diagnosis not present

## 2021-03-03 DIAGNOSIS — Z299 Encounter for prophylactic measures, unspecified: Secondary | ICD-10-CM | POA: Diagnosis not present

## 2021-03-03 DIAGNOSIS — G4700 Insomnia, unspecified: Secondary | ICD-10-CM | POA: Diagnosis not present

## 2021-03-03 DIAGNOSIS — F17210 Nicotine dependence, cigarettes, uncomplicated: Secondary | ICD-10-CM | POA: Diagnosis not present

## 2021-03-12 DIAGNOSIS — F17210 Nicotine dependence, cigarettes, uncomplicated: Secondary | ICD-10-CM | POA: Diagnosis not present

## 2021-03-12 DIAGNOSIS — Z299 Encounter for prophylactic measures, unspecified: Secondary | ICD-10-CM | POA: Diagnosis not present

## 2021-03-12 DIAGNOSIS — I69354 Hemiplegia and hemiparesis following cerebral infarction affecting left non-dominant side: Secondary | ICD-10-CM | POA: Diagnosis not present

## 2021-03-12 DIAGNOSIS — F909 Attention-deficit hyperactivity disorder, unspecified type: Secondary | ICD-10-CM | POA: Diagnosis not present

## 2021-03-12 DIAGNOSIS — Z6821 Body mass index (BMI) 21.0-21.9, adult: Secondary | ICD-10-CM | POA: Diagnosis not present

## 2021-03-12 DIAGNOSIS — I10 Essential (primary) hypertension: Secondary | ICD-10-CM | POA: Diagnosis not present

## 2021-03-18 DIAGNOSIS — I69354 Hemiplegia and hemiparesis following cerebral infarction affecting left non-dominant side: Secondary | ICD-10-CM | POA: Diagnosis not present

## 2021-03-20 DIAGNOSIS — M62838 Other muscle spasm: Secondary | ICD-10-CM | POA: Diagnosis not present

## 2021-03-20 DIAGNOSIS — M542 Cervicalgia: Secondary | ICD-10-CM | POA: Diagnosis not present

## 2021-03-20 DIAGNOSIS — I872 Venous insufficiency (chronic) (peripheral): Secondary | ICD-10-CM | POA: Diagnosis not present

## 2021-03-20 DIAGNOSIS — F909 Attention-deficit hyperactivity disorder, unspecified type: Secondary | ICD-10-CM | POA: Diagnosis not present

## 2021-03-20 DIAGNOSIS — K2100 Gastro-esophageal reflux disease with esophagitis, without bleeding: Secondary | ICD-10-CM | POA: Diagnosis not present

## 2021-03-20 DIAGNOSIS — I69354 Hemiplegia and hemiparesis following cerebral infarction affecting left non-dominant side: Secondary | ICD-10-CM | POA: Diagnosis not present

## 2021-03-20 DIAGNOSIS — G4700 Insomnia, unspecified: Secondary | ICD-10-CM | POA: Diagnosis not present

## 2021-03-20 DIAGNOSIS — I10 Essential (primary) hypertension: Secondary | ICD-10-CM | POA: Diagnosis not present

## 2021-03-20 DIAGNOSIS — F332 Major depressive disorder, recurrent severe without psychotic features: Secondary | ICD-10-CM | POA: Diagnosis not present

## 2021-03-20 DIAGNOSIS — F4321 Adjustment disorder with depressed mood: Secondary | ICD-10-CM | POA: Diagnosis not present

## 2021-03-20 DIAGNOSIS — R519 Headache, unspecified: Secondary | ICD-10-CM | POA: Diagnosis not present

## 2021-03-20 DIAGNOSIS — R569 Unspecified convulsions: Secondary | ICD-10-CM | POA: Diagnosis not present

## 2021-03-20 DIAGNOSIS — K4090 Unilateral inguinal hernia, without obstruction or gangrene, not specified as recurrent: Secondary | ICD-10-CM | POA: Diagnosis not present

## 2021-03-26 DIAGNOSIS — I351 Nonrheumatic aortic (valve) insufficiency: Secondary | ICD-10-CM | POA: Diagnosis not present

## 2021-03-26 DIAGNOSIS — I7101 Dissection of thoracic aorta: Secondary | ICD-10-CM | POA: Diagnosis not present

## 2021-03-26 DIAGNOSIS — I7100 Dissection of unspecified site of aorta: Secondary | ICD-10-CM | POA: Diagnosis not present

## 2021-03-26 DIAGNOSIS — Z7982 Long term (current) use of aspirin: Secondary | ICD-10-CM | POA: Diagnosis not present

## 2021-03-26 DIAGNOSIS — I728 Aneurysm of other specified arteries: Secondary | ICD-10-CM | POA: Diagnosis not present

## 2021-03-26 DIAGNOSIS — I723 Aneurysm of iliac artery: Secondary | ICD-10-CM | POA: Diagnosis not present

## 2021-03-26 DIAGNOSIS — R93421 Abnormal radiologic findings on diagnostic imaging of right kidney: Secondary | ICD-10-CM | POA: Diagnosis not present

## 2021-03-26 DIAGNOSIS — I712 Thoracic aortic aneurysm, without rupture: Secondary | ICD-10-CM | POA: Diagnosis not present

## 2021-03-26 DIAGNOSIS — Z95828 Presence of other vascular implants and grafts: Secondary | ICD-10-CM | POA: Diagnosis not present

## 2021-03-26 DIAGNOSIS — Z6820 Body mass index (BMI) 20.0-20.9, adult: Secondary | ICD-10-CM | POA: Diagnosis not present

## 2021-04-01 DIAGNOSIS — I69354 Hemiplegia and hemiparesis following cerebral infarction affecting left non-dominant side: Secondary | ICD-10-CM | POA: Diagnosis not present

## 2021-04-01 DIAGNOSIS — Z299 Encounter for prophylactic measures, unspecified: Secondary | ICD-10-CM | POA: Diagnosis not present

## 2021-04-01 DIAGNOSIS — F909 Attention-deficit hyperactivity disorder, unspecified type: Secondary | ICD-10-CM | POA: Diagnosis not present

## 2021-04-02 DIAGNOSIS — S72112D Displaced fracture of greater trochanter of left femur, subsequent encounter for closed fracture with routine healing: Secondary | ICD-10-CM | POA: Diagnosis not present

## 2021-04-02 DIAGNOSIS — L84 Corns and callosities: Secondary | ICD-10-CM | POA: Diagnosis not present

## 2021-04-02 DIAGNOSIS — M160 Bilateral primary osteoarthritis of hip: Secondary | ICD-10-CM | POA: Diagnosis not present

## 2021-04-02 DIAGNOSIS — S72112A Displaced fracture of greater trochanter of left femur, initial encounter for closed fracture: Secondary | ICD-10-CM | POA: Diagnosis not present

## 2021-04-20 DIAGNOSIS — I69354 Hemiplegia and hemiparesis following cerebral infarction affecting left non-dominant side: Secondary | ICD-10-CM | POA: Diagnosis not present

## 2021-04-20 DIAGNOSIS — I10 Essential (primary) hypertension: Secondary | ICD-10-CM | POA: Diagnosis not present

## 2021-04-20 DIAGNOSIS — I872 Venous insufficiency (chronic) (peripheral): Secondary | ICD-10-CM | POA: Diagnosis not present

## 2021-04-20 DIAGNOSIS — R569 Unspecified convulsions: Secondary | ICD-10-CM | POA: Diagnosis not present

## 2021-04-20 DIAGNOSIS — F332 Major depressive disorder, recurrent severe without psychotic features: Secondary | ICD-10-CM | POA: Diagnosis not present

## 2021-04-20 DIAGNOSIS — M62838 Other muscle spasm: Secondary | ICD-10-CM | POA: Diagnosis not present

## 2021-04-20 DIAGNOSIS — M542 Cervicalgia: Secondary | ICD-10-CM | POA: Diagnosis not present

## 2021-04-20 DIAGNOSIS — F4321 Adjustment disorder with depressed mood: Secondary | ICD-10-CM | POA: Diagnosis not present

## 2021-04-20 DIAGNOSIS — R519 Headache, unspecified: Secondary | ICD-10-CM | POA: Diagnosis not present

## 2021-04-20 DIAGNOSIS — F909 Attention-deficit hyperactivity disorder, unspecified type: Secondary | ICD-10-CM | POA: Diagnosis not present

## 2021-04-20 DIAGNOSIS — G4700 Insomnia, unspecified: Secondary | ICD-10-CM | POA: Diagnosis not present

## 2021-04-20 DIAGNOSIS — K2100 Gastro-esophageal reflux disease with esophagitis, without bleeding: Secondary | ICD-10-CM | POA: Diagnosis not present

## 2021-04-20 DIAGNOSIS — K4090 Unilateral inguinal hernia, without obstruction or gangrene, not specified as recurrent: Secondary | ICD-10-CM | POA: Diagnosis not present

## 2021-05-05 DIAGNOSIS — F17210 Nicotine dependence, cigarettes, uncomplicated: Secondary | ICD-10-CM | POA: Diagnosis not present

## 2021-05-05 DIAGNOSIS — F909 Attention-deficit hyperactivity disorder, unspecified type: Secondary | ICD-10-CM | POA: Diagnosis not present

## 2021-05-05 DIAGNOSIS — G4700 Insomnia, unspecified: Secondary | ICD-10-CM | POA: Diagnosis not present

## 2021-05-05 DIAGNOSIS — G2581 Restless legs syndrome: Secondary | ICD-10-CM | POA: Diagnosis not present

## 2021-05-05 DIAGNOSIS — S161XXA Strain of muscle, fascia and tendon at neck level, initial encounter: Secondary | ICD-10-CM | POA: Diagnosis not present

## 2021-05-05 DIAGNOSIS — Z299 Encounter for prophylactic measures, unspecified: Secondary | ICD-10-CM | POA: Diagnosis not present

## 2021-05-07 DIAGNOSIS — I071 Rheumatic tricuspid insufficiency: Secondary | ICD-10-CM | POA: Diagnosis not present

## 2021-05-07 DIAGNOSIS — I7100 Dissection of unspecified site of aorta: Secondary | ICD-10-CM | POA: Diagnosis not present

## 2021-05-07 DIAGNOSIS — I351 Nonrheumatic aortic (valve) insufficiency: Secondary | ICD-10-CM | POA: Diagnosis not present

## 2021-05-07 DIAGNOSIS — Z95828 Presence of other vascular implants and grafts: Secondary | ICD-10-CM | POA: Diagnosis not present

## 2021-05-14 DIAGNOSIS — Z952 Presence of prosthetic heart valve: Secondary | ICD-10-CM | POA: Diagnosis not present

## 2021-05-14 DIAGNOSIS — I10 Essential (primary) hypertension: Secondary | ICD-10-CM | POA: Diagnosis not present

## 2021-05-14 DIAGNOSIS — Z681 Body mass index (BMI) 19 or less, adult: Secondary | ICD-10-CM | POA: Diagnosis not present

## 2021-05-14 DIAGNOSIS — Z95828 Presence of other vascular implants and grafts: Secondary | ICD-10-CM | POA: Diagnosis not present

## 2021-05-14 DIAGNOSIS — I723 Aneurysm of iliac artery: Secondary | ICD-10-CM | POA: Diagnosis not present

## 2021-06-03 DIAGNOSIS — I10 Essential (primary) hypertension: Secondary | ICD-10-CM | POA: Diagnosis not present

## 2021-06-03 DIAGNOSIS — Z299 Encounter for prophylactic measures, unspecified: Secondary | ICD-10-CM | POA: Diagnosis not present

## 2021-06-03 DIAGNOSIS — F909 Attention-deficit hyperactivity disorder, unspecified type: Secondary | ICD-10-CM | POA: Diagnosis not present

## 2021-06-03 DIAGNOSIS — M542 Cervicalgia: Secondary | ICD-10-CM | POA: Diagnosis not present

## 2021-06-03 DIAGNOSIS — G4700 Insomnia, unspecified: Secondary | ICD-10-CM | POA: Diagnosis not present

## 2021-06-10 DIAGNOSIS — M21372 Foot drop, left foot: Secondary | ICD-10-CM | POA: Diagnosis not present

## 2021-07-01 DIAGNOSIS — M25512 Pain in left shoulder: Secondary | ICD-10-CM | POA: Diagnosis not present

## 2021-07-01 DIAGNOSIS — Z299 Encounter for prophylactic measures, unspecified: Secondary | ICD-10-CM | POA: Diagnosis not present

## 2021-07-01 DIAGNOSIS — F17210 Nicotine dependence, cigarettes, uncomplicated: Secondary | ICD-10-CM | POA: Diagnosis not present

## 2021-07-01 DIAGNOSIS — Z6821 Body mass index (BMI) 21.0-21.9, adult: Secondary | ICD-10-CM | POA: Diagnosis not present

## 2021-07-01 DIAGNOSIS — F909 Attention-deficit hyperactivity disorder, unspecified type: Secondary | ICD-10-CM | POA: Diagnosis not present

## 2021-07-20 DIAGNOSIS — I10 Essential (primary) hypertension: Secondary | ICD-10-CM | POA: Diagnosis not present

## 2021-07-20 DIAGNOSIS — Z299 Encounter for prophylactic measures, unspecified: Secondary | ICD-10-CM | POA: Diagnosis not present

## 2021-07-20 DIAGNOSIS — I69354 Hemiplegia and hemiparesis following cerebral infarction affecting left non-dominant side: Secondary | ICD-10-CM | POA: Diagnosis not present

## 2021-07-20 DIAGNOSIS — F909 Attention-deficit hyperactivity disorder, unspecified type: Secondary | ICD-10-CM | POA: Diagnosis not present

## 2021-07-20 DIAGNOSIS — Z789 Other specified health status: Secondary | ICD-10-CM | POA: Diagnosis not present

## 2021-07-30 ENCOUNTER — Encounter: Payer: Self-pay | Admitting: Neurology

## 2021-08-03 NOTE — Telephone Encounter (Signed)
error 

## 2021-08-05 DIAGNOSIS — I10 Essential (primary) hypertension: Secondary | ICD-10-CM | POA: Diagnosis not present

## 2021-08-05 DIAGNOSIS — F1911 Other psychoactive substance abuse, in remission: Secondary | ICD-10-CM | POA: Diagnosis not present

## 2021-08-05 DIAGNOSIS — F909 Attention-deficit hyperactivity disorder, unspecified type: Secondary | ICD-10-CM | POA: Diagnosis not present

## 2021-08-05 DIAGNOSIS — R062 Wheezing: Secondary | ICD-10-CM | POA: Diagnosis not present

## 2021-08-05 DIAGNOSIS — Z299 Encounter for prophylactic measures, unspecified: Secondary | ICD-10-CM | POA: Diagnosis not present

## 2021-10-02 DIAGNOSIS — Z299 Encounter for prophylactic measures, unspecified: Secondary | ICD-10-CM | POA: Diagnosis not present

## 2021-10-02 DIAGNOSIS — I10 Essential (primary) hypertension: Secondary | ICD-10-CM | POA: Diagnosis not present

## 2021-10-02 DIAGNOSIS — M542 Cervicalgia: Secondary | ICD-10-CM | POA: Diagnosis not present

## 2021-10-02 DIAGNOSIS — F909 Attention-deficit hyperactivity disorder, unspecified type: Secondary | ICD-10-CM | POA: Diagnosis not present

## 2021-10-15 DIAGNOSIS — L602 Onychogryphosis: Secondary | ICD-10-CM | POA: Diagnosis not present

## 2021-10-15 DIAGNOSIS — Z6821 Body mass index (BMI) 21.0-21.9, adult: Secondary | ICD-10-CM | POA: Diagnosis not present

## 2021-10-15 DIAGNOSIS — F909 Attention-deficit hyperactivity disorder, unspecified type: Secondary | ICD-10-CM | POA: Diagnosis not present

## 2021-10-15 DIAGNOSIS — I69354 Hemiplegia and hemiparesis following cerebral infarction affecting left non-dominant side: Secondary | ICD-10-CM | POA: Diagnosis not present

## 2021-10-15 DIAGNOSIS — Z299 Encounter for prophylactic measures, unspecified: Secondary | ICD-10-CM | POA: Diagnosis not present

## 2021-10-15 DIAGNOSIS — Z993 Dependence on wheelchair: Secondary | ICD-10-CM | POA: Diagnosis not present

## 2021-10-15 DIAGNOSIS — I10 Essential (primary) hypertension: Secondary | ICD-10-CM | POA: Diagnosis not present

## 2021-10-15 DIAGNOSIS — Z789 Other specified health status: Secondary | ICD-10-CM | POA: Diagnosis not present

## 2021-11-09 DIAGNOSIS — Z716 Tobacco abuse counseling: Secondary | ICD-10-CM | POA: Diagnosis not present

## 2021-11-09 DIAGNOSIS — Z952 Presence of prosthetic heart valve: Secondary | ICD-10-CM | POA: Diagnosis not present

## 2021-11-09 DIAGNOSIS — Z6821 Body mass index (BMI) 21.0-21.9, adult: Secondary | ICD-10-CM | POA: Diagnosis not present

## 2021-11-09 DIAGNOSIS — I10 Essential (primary) hypertension: Secondary | ICD-10-CM | POA: Diagnosis not present

## 2021-11-09 DIAGNOSIS — Z13220 Encounter for screening for lipoid disorders: Secondary | ICD-10-CM | POA: Diagnosis not present

## 2021-11-09 DIAGNOSIS — I71019 Dissection of thoracic aorta, unspecified: Secondary | ICD-10-CM | POA: Diagnosis not present

## 2021-11-25 DIAGNOSIS — F909 Attention-deficit hyperactivity disorder, unspecified type: Secondary | ICD-10-CM | POA: Diagnosis not present

## 2021-11-25 DIAGNOSIS — Z299 Encounter for prophylactic measures, unspecified: Secondary | ICD-10-CM | POA: Diagnosis not present

## 2021-11-25 DIAGNOSIS — I10 Essential (primary) hypertension: Secondary | ICD-10-CM | POA: Diagnosis not present

## 2021-11-25 DIAGNOSIS — F17210 Nicotine dependence, cigarettes, uncomplicated: Secondary | ICD-10-CM | POA: Diagnosis not present

## 2021-12-03 DIAGNOSIS — I672 Cerebral atherosclerosis: Secondary | ICD-10-CM | POA: Diagnosis not present

## 2021-12-03 DIAGNOSIS — F909 Attention-deficit hyperactivity disorder, unspecified type: Secondary | ICD-10-CM | POA: Diagnosis not present

## 2021-12-03 DIAGNOSIS — I69354 Hemiplegia and hemiparesis following cerebral infarction affecting left non-dominant side: Secondary | ICD-10-CM | POA: Diagnosis not present

## 2021-12-03 DIAGNOSIS — F17210 Nicotine dependence, cigarettes, uncomplicated: Secondary | ICD-10-CM | POA: Diagnosis not present

## 2021-12-03 DIAGNOSIS — F32A Depression, unspecified: Secondary | ICD-10-CM | POA: Diagnosis not present

## 2021-12-03 DIAGNOSIS — I10 Essential (primary) hypertension: Secondary | ICD-10-CM | POA: Diagnosis not present

## 2021-12-21 DIAGNOSIS — F909 Attention-deficit hyperactivity disorder, unspecified type: Secondary | ICD-10-CM | POA: Diagnosis not present

## 2021-12-21 DIAGNOSIS — I10 Essential (primary) hypertension: Secondary | ICD-10-CM | POA: Diagnosis not present

## 2021-12-21 DIAGNOSIS — I672 Cerebral atherosclerosis: Secondary | ICD-10-CM | POA: Diagnosis not present

## 2021-12-21 DIAGNOSIS — F17210 Nicotine dependence, cigarettes, uncomplicated: Secondary | ICD-10-CM | POA: Diagnosis not present

## 2021-12-21 DIAGNOSIS — I69354 Hemiplegia and hemiparesis following cerebral infarction affecting left non-dominant side: Secondary | ICD-10-CM | POA: Diagnosis not present

## 2021-12-21 DIAGNOSIS — F32A Depression, unspecified: Secondary | ICD-10-CM | POA: Diagnosis not present

## 2021-12-23 DIAGNOSIS — Z299 Encounter for prophylactic measures, unspecified: Secondary | ICD-10-CM | POA: Diagnosis not present

## 2021-12-23 DIAGNOSIS — G2581 Restless legs syndrome: Secondary | ICD-10-CM | POA: Diagnosis not present

## 2021-12-23 DIAGNOSIS — F17210 Nicotine dependence, cigarettes, uncomplicated: Secondary | ICD-10-CM | POA: Diagnosis not present

## 2021-12-23 DIAGNOSIS — F909 Attention-deficit hyperactivity disorder, unspecified type: Secondary | ICD-10-CM | POA: Diagnosis not present

## 2021-12-23 DIAGNOSIS — I10 Essential (primary) hypertension: Secondary | ICD-10-CM | POA: Diagnosis not present

## 2021-12-23 DIAGNOSIS — F32A Depression, unspecified: Secondary | ICD-10-CM | POA: Diagnosis not present

## 2022-01-21 DIAGNOSIS — F909 Attention-deficit hyperactivity disorder, unspecified type: Secondary | ICD-10-CM | POA: Diagnosis not present

## 2022-01-21 DIAGNOSIS — F17210 Nicotine dependence, cigarettes, uncomplicated: Secondary | ICD-10-CM | POA: Diagnosis not present

## 2022-01-21 DIAGNOSIS — Z299 Encounter for prophylactic measures, unspecified: Secondary | ICD-10-CM | POA: Diagnosis not present

## 2022-01-21 DIAGNOSIS — Z1211 Encounter for screening for malignant neoplasm of colon: Secondary | ICD-10-CM | POA: Diagnosis not present

## 2022-01-21 DIAGNOSIS — I672 Cerebral atherosclerosis: Secondary | ICD-10-CM | POA: Diagnosis not present

## 2022-01-21 DIAGNOSIS — Z8673 Personal history of transient ischemic attack (TIA), and cerebral infarction without residual deficits: Secondary | ICD-10-CM | POA: Diagnosis not present

## 2022-01-22 DIAGNOSIS — F32A Depression, unspecified: Secondary | ICD-10-CM | POA: Diagnosis not present

## 2022-01-22 DIAGNOSIS — I69354 Hemiplegia and hemiparesis following cerebral infarction affecting left non-dominant side: Secondary | ICD-10-CM | POA: Diagnosis not present

## 2022-01-22 DIAGNOSIS — I672 Cerebral atherosclerosis: Secondary | ICD-10-CM | POA: Diagnosis not present

## 2022-01-22 DIAGNOSIS — F909 Attention-deficit hyperactivity disorder, unspecified type: Secondary | ICD-10-CM | POA: Diagnosis not present

## 2022-01-22 DIAGNOSIS — I10 Essential (primary) hypertension: Secondary | ICD-10-CM | POA: Diagnosis not present

## 2022-01-22 DIAGNOSIS — F17210 Nicotine dependence, cigarettes, uncomplicated: Secondary | ICD-10-CM | POA: Diagnosis not present

## 2022-02-17 DIAGNOSIS — I10 Essential (primary) hypertension: Secondary | ICD-10-CM | POA: Diagnosis not present

## 2022-02-17 DIAGNOSIS — F17210 Nicotine dependence, cigarettes, uncomplicated: Secondary | ICD-10-CM | POA: Diagnosis not present

## 2022-02-17 DIAGNOSIS — F909 Attention-deficit hyperactivity disorder, unspecified type: Secondary | ICD-10-CM | POA: Diagnosis not present

## 2022-02-17 DIAGNOSIS — Z299 Encounter for prophylactic measures, unspecified: Secondary | ICD-10-CM | POA: Diagnosis not present

## 2022-02-19 ENCOUNTER — Telehealth (INDEPENDENT_AMBULATORY_CARE_PROVIDER_SITE_OTHER): Payer: PPO | Admitting: Neurology

## 2022-02-19 ENCOUNTER — Encounter: Payer: Self-pay | Admitting: Neurology

## 2022-02-19 VITALS — Ht 72.0 in | Wt 155.0 lb

## 2022-02-19 DIAGNOSIS — G40009 Localization-related (focal) (partial) idiopathic epilepsy and epileptic syndromes with seizures of localized onset, not intractable, without status epilepticus: Secondary | ICD-10-CM | POA: Diagnosis not present

## 2022-02-19 DIAGNOSIS — Z8673 Personal history of transient ischemic attack (TIA), and cerebral infarction without residual deficits: Secondary | ICD-10-CM | POA: Diagnosis not present

## 2022-02-19 MED ORDER — DIVALPROEX SODIUM 250 MG PO DR TAB: DELAYED_RELEASE_TABLET | ORAL | 11 refills | Status: DC

## 2022-02-19 NOTE — Progress Notes (Signed)
Telephone (Audio) Visit The purpose of this telephone visit is to provide medical care while limiting exposure to the novel coronavirus.    Consent was obtained for telephone visit:  Yes.   Answered questions that patient had about telehealth interaction:  Yes.   I discussed the limitations, risks, security and privacy concerns of performing an evaluation and management service by telephone. I also discussed with the patient that there may be a patient responsible charge related to this service. The patient expressed understanding and agreed to proceed.  Pt location: Home Physician Location: office Name of referring provider:  Arsenio Katz, NP I connected with .Daniel Arroyo at patients initiation/request on 02/19/2022 at  3:00 PM EDT by telephone and verified that I am speaking with the correct person using two identifiers.  Pt MRN:  [id number] Pt DOB:  May 01, 1971   History of Present Illness:  The patient had a telephone visit on 02/19/2022. Unable to connect via video due to technical difficulties. 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 He continues to deny any seizures since last visit, no loss of consciousness since 05/2019, no focal motor seizures in 3 years since increasing Depakote to 250mg  in AM, 500mg  in PM. He lives alone and denies being told of any staring/unresponsive episodes. He denies any loss of time, olfactory/gustatory hallucinations, myoclonic jerks. He reports he can barely walk, he has a walker but can barely stand up. He orders food through [employer]. His parents "come only for 2 minutes." He states he cannot do anything and needs some nursing assistance or nursing facility. He became increasingly agitated and frustrated stating he keeps telling everyone he cannot do it but "they say I make too much money." HE reports that APS and social work came to his home and he has had this same conversation several times and  is tired of it. 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 Per notes, stroke was cryptogenic. He did endorse cocaine use around that time. He started having focal seizures in September 2019 where he describes left arm clonic activity that he cannot control. He has clonus but states the clonic activity is different. Face and leg are not affected. They last around 5 minutes, no clear worsened weakness or worsened numbness after.  He notices it more when he gets frustrated, stressed, or is struggling/straining with something. He usually takes a deep breath and the clonic activity would calm down. This was going on intermittently for a year until September 2020 while he was home alone, he started having the left arm clonic activity but this time he lost consciousness. He apparently grabbed his TV and the TV fell on top of him, fracturing his left arm. He woke up on the ground, no tongue bite or incontinence, and was brought to [facility]. He was told there was no new stroke. His PCP started him on low dose Depakote which initially quieted down the clonic activity, then again recurred when he would get frustrated. He had a  Depakote level which was subtherapeutic, and dose was increased to 250mg  BID a couple of weeks ago. He denies any side effects. He denies any other episodes of gaps in time, olfactory/gustatory hallucinations. His father denies any staring/unresponsive episodes. He lives independently with family close by. He states his sleep is horrible and has not  noticed any relation of seizures to poor sleep. He denies any alcohol intake in over a year, no illicit drug use since the stroke. He has been doing PT for his left arm fracture and notices some pain when his left arm is extended, but there is typically no pain. He is concerned that up until May/June 2020, he was more independent, walking daily to [place], but over the past few months his stamina and whole physical ability have decreased rapidly. He wonders about Botox helping with spasticity. He has also been dealing with bilateral lymphedema in his legs since late Spring/early Summer, with a wound on his right foot. He denies any headaches, dizziness, diplopia, dysphagia, back pain. He has neck pain and difficulty holding his head up. He has constipation due to Suboxone.   Epilepsy Risk Factors:  History of right MCA stroke. Otherwise he had a normal birth and early development.  There is no history of febrile convulsions, CNS infections such as meningitis/encephalitis, significant traumatic brain injury, neurosurgical procedures, or family history of seizures.  Diagnostic Data: His 1-hour wake and asleep EEG done 08/2019 was abnormal due to occasional focal slowing over the right hemisphere.    Current Outpatient Medications on File Prior to Visit  Medication Sig Dispense Refill   albuterol (VENTOLIN HFA) 108 (90 Base) MCG/ACT inhaler As directed     amphetamine-dextroamphetamine (ADDERALL XR) 30 MG 24 hr capsule Take 30 mg by mouth in the morning and at bedtime.     cyclobenzaprine (FLEXERIL) 10 MG tablet Take 10 mg by mouth daily as needed.     divalproex (DEPAKOTE) 250 MG DR tablet Take 1 tab in AM, 2 tabs in PM 90 tablet 11   escitalopram (LEXAPRO) 10 MG tablet Take 10 mg by mouth daily.     fluticasone (FLONASE) 50 MCG/ACT nasal spray Place 1 spray into both nostrils daily.     furosemide (LASIX) 20 MG tablet TAKE 1 TABLET BY MOUTH DAILY 90 tablet 0   mirtazapine (REMERON) 15 MG tablet Take  1 tablet (15 mg total) by mouth at bedtime. 30 tablet 0   mirtazapine (REMERON) 30 MG tablet Take 30 mg by mouth at bedtime.     omeprazole (PRILOSEC) 20 MG capsule Take 20 mg by mouth daily.     tadalafil (CIALIS) 20 MG tablet Take 20 mg by mouth daily.     temazepam (RESTORIL) 15 MG capsule Take 1 capsule by mouth at bedtime.     No current facility-administered medications on file prior to visit.      Observations/Objective:   Vitals:   02/19/22 1407  Weight: 155 lb (70.3 kg)  Height: 6' (1.829 m)   Exam limited due to nature of phone visit. Patient awake, alert, no confusion.   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 He continues to deny any focal seizures in 3 years since increasing Depakote to 250mg  in AM, 500mg  in PM. No further loss of consciousness since 05/2019. Refills sent for Depakote. His main frustration today  is difficulty living independently with his left hemiparesis and little help. He expressed frustration with prior evaluations but agrees to speak to our social worker if there is any further assistance we can provide. He does not drive. Follow-up in 1 year, call for any changes.    Follow Up Instructions:   -I discussed the assessment and treatment plan with the patient. The patient was provided an opportunity to ask questions and all were answered. The patient agreed with the plan and demonstrated an understanding of the instructions.   The patient was advised to call back or seek an in-person evaluation if the symptoms worsen or if the condition fails to improve as anticipated.    Total Time spent in visit with the patient was:  16 minutes, of which 100% of the time was spent in counseling and/or coordinating care on the above.   Pt understands and agrees with the plan of care outlined.     Cameron Sprang, MD

## 2022-02-19 NOTE — Patient Instructions (Signed)
Refills sent for Depakote 250mg  in AM, 500mg  in PM. Our social worker will contact you to see if there is further assistance we can provide with your current situation. Follow-up in 1 year, call for any changes.    Seizure Precautions: 1. If medication has been prescribed for you to prevent seizures, take it exactly as directed.  Do not stop taking the medicine without talking to your doctor first, even if you have not had a seizure in a long time.   2. Avoid activities in which a seizure would cause danger to yourself or to others.  Don't operate dangerous machinery, swim alone, or climb in high or dangerous places, such as on ladders, roofs, or girders.  Do not drive unless your doctor says you may.  3. If you have any warning that you may have a seizure, lay down in a safe place where you can't hurt yourself.    4.  No driving for 6 months from last seizure, as per [facility].   Please refer to the following link on the Epilepsy Foundation of America's website for more information: http://www.epilepsyfoundation.org/answerplace/Social/driving/drivingu.cfm   5.  Maintain good sleep hygiene. Avoid alcohol.  6.  Contact your doctor if you have any problems that may be related to the medicine you are taking.  7.  Call 911 and bring the patient back to the ED if:        A.  The seizure lasts longer than 5 minutes.       B.  The patient doesn't awaken shortly after the seizure  C.  The patient has new problems such as difficulty seeing, speaking or moving  D.  The patient was injured during the seizure  E.  The patient has a temperature over 102 F (39C)  F.  The patient vomited and now is having trouble breathing

## 2022-02-26 ENCOUNTER — Ambulatory Visit: Payer: PPO | Admitting: Neurology

## 2022-03-11 ENCOUNTER — Institutional Professional Consult (permissible substitution): Payer: PPO | Admitting: Licensed Clinical Social Worker

## 2022-03-15 ENCOUNTER — Institutional Professional Consult (permissible substitution): Payer: PPO | Admitting: Licensed Clinical Social Worker

## 2022-03-17 DIAGNOSIS — Z299 Encounter for prophylactic measures, unspecified: Secondary | ICD-10-CM | POA: Diagnosis not present

## 2022-03-17 DIAGNOSIS — I10 Essential (primary) hypertension: Secondary | ICD-10-CM | POA: Diagnosis not present

## 2022-03-17 DIAGNOSIS — F909 Attention-deficit hyperactivity disorder, unspecified type: Secondary | ICD-10-CM | POA: Diagnosis not present

## 2022-03-17 DIAGNOSIS — Z789 Other specified health status: Secondary | ICD-10-CM | POA: Diagnosis not present

## 2022-03-17 DIAGNOSIS — G2581 Restless legs syndrome: Secondary | ICD-10-CM | POA: Diagnosis not present

## 2022-03-29 ENCOUNTER — Ambulatory Visit: Payer: PPO | Admitting: Licensed Clinical Social Worker

## 2022-03-29 DIAGNOSIS — F4321 Adjustment disorder with depressed mood: Secondary | ICD-10-CM | POA: Diagnosis not present

## 2022-03-29 NOTE — BH Specialist Note (Signed)
Integrated Behavioral Health Initial In-Person Visit  MRN: [id number] Name: Daniel Arroyo  Number of Integrated Behavioral Health Clinician visits: 1- Initial Visit  Session Start time: 1345    Session End time: 1450  Total time in minutes: 65   Types of Service: Individual psychotherapy  Interpretor:No. Interpretor Name and Language: NA   Warm Hand Off Completed.        Subjective: Daniel Arroyo is a 51 y.o. male accompanied by Father Patient was referred by Dr. Karel Jarvis for Process of emotions dealing with frustration, anger, with changes that have occurred due to medical issues and establishment of new identify  . Patient reports the following symptoms/concerns: Some feelings of depressed mood, transition of being out of the nursing home and trying to live independently, resolve past feelings of anger and how to deal with changes of loss of mobility and other issues .  Duration of problem: Several months ; Severity of problem: moderate  Objective: Mood: Depressed and Affect: Appropriate Risk of harm to self or others: No plan to harm self or others  Life Context: [place] and Social[place] has the support of some family including his father but reports lives alone .  School/Work: Due to medical issues and past drug use , Pt is on disability , and no longer works  Self-Care: Pt does need some assistance with ADL's but does live independently   Life Changes: Pt report history of stroke, and Heart issues that has left him disabled with left sided weakness and difficulty walking   Patient and/or Family's Strengths/Protective Factors: Concrete supports in place (healthy food, safe environments, etc.)  Goals Addressed: Patient will: Reduce symptoms of: mood instability Increase knowledge and/or ability of: coping skills and stress reduction  Demonstrate ability to: Increase healthy adjustment to current life circumstances  Progress towards  Goals: Ongoing  Interventions: Interventions utilized: Motivational Interviewing, Solution-Focused Strategies, Supportive Counseling, and Link to [employer] Assessments completed: Not Needed  Patient and/or Family Response: Pt open to looking at resolving feelings of sadness and anger to loss of former role and developing new social support . Increase support system and community resources ,  Look at identifying new strengths and new life role   Patient Centered Plan: Patient is on the following Treatment Plan(s):  Look at building social connections again reduce isolation, look at attending Adult Day center or Support Group options which were provided , connect community resources to see if they have the resources to assist for in home option for and support .   Assessment: Patient currently experiencing Pt reports some frustration, anger at times and depressed mood with change in life and transition. Pt reports had been mobile living independently doing okay but suffered another medical crisis and since that time mobility has gone down hill and frustration with needing assistance and with loss of former role.     Patient may benefit from Being able to process feelings, develop coping skills necessary to function with new changes, develop new sense of strength and sense of self, increase social support and support in the home  .  Plan: Follow up with behavioral health clinician on : In three to four weeks or as needed  Behavioral recommendations: Follow up with community Resources Provided  Referral(s):  Adult Day Center, CAP, [facility], Stroke Support Group, Palliative, [facility], DSS, Elder Law   "From scale of 1-10, how likely are you to follow plan?": 10  Dathan Attia A Taylor-Paladino, LCSW

## 2022-04-22 DIAGNOSIS — Z299 Encounter for prophylactic measures, unspecified: Secondary | ICD-10-CM | POA: Diagnosis not present

## 2022-04-22 DIAGNOSIS — F909 Attention-deficit hyperactivity disorder, unspecified type: Secondary | ICD-10-CM | POA: Diagnosis not present

## 2022-04-22 DIAGNOSIS — I10 Essential (primary) hypertension: Secondary | ICD-10-CM | POA: Diagnosis not present

## 2022-04-22 DIAGNOSIS — G2581 Restless legs syndrome: Secondary | ICD-10-CM | POA: Diagnosis not present

## 2022-04-22 DIAGNOSIS — Z789 Other specified health status: Secondary | ICD-10-CM | POA: Diagnosis not present

## 2022-05-03 DIAGNOSIS — M79674 Pain in right toe(s): Secondary | ICD-10-CM | POA: Diagnosis not present

## 2022-05-03 DIAGNOSIS — B351 Tinea unguium: Secondary | ICD-10-CM | POA: Diagnosis not present

## 2022-05-03 DIAGNOSIS — M79675 Pain in left toe(s): Secondary | ICD-10-CM | POA: Diagnosis not present

## 2022-05-03 DIAGNOSIS — M21372 Foot drop, left foot: Secondary | ICD-10-CM | POA: Diagnosis not present

## 2022-05-19 DIAGNOSIS — Z299 Encounter for prophylactic measures, unspecified: Secondary | ICD-10-CM | POA: Diagnosis not present

## 2022-05-19 DIAGNOSIS — F909 Attention-deficit hyperactivity disorder, unspecified type: Secondary | ICD-10-CM | POA: Diagnosis not present

## 2022-05-19 DIAGNOSIS — I10 Essential (primary) hypertension: Secondary | ICD-10-CM | POA: Diagnosis not present

## 2022-06-17 DIAGNOSIS — I7143 Infrarenal abdominal aortic aneurysm, without rupture: Secondary | ICD-10-CM | POA: Diagnosis not present

## 2022-06-17 DIAGNOSIS — I71019 Dissection of thoracic aorta, unspecified: Secondary | ICD-10-CM | POA: Diagnosis not present

## 2022-06-17 DIAGNOSIS — Z952 Presence of prosthetic heart valve: Secondary | ICD-10-CM | POA: Diagnosis not present

## 2022-06-17 DIAGNOSIS — Z6821 Body mass index (BMI) 21.0-21.9, adult: Secondary | ICD-10-CM | POA: Diagnosis not present

## 2022-06-17 DIAGNOSIS — I340 Nonrheumatic mitral (valve) insufficiency: Secondary | ICD-10-CM | POA: Diagnosis not present

## 2022-06-17 DIAGNOSIS — J984 Other disorders of lung: Secondary | ICD-10-CM | POA: Diagnosis not present

## 2022-06-17 DIAGNOSIS — Z7982 Long term (current) use of aspirin: Secondary | ICD-10-CM | POA: Diagnosis not present

## 2022-06-22 DIAGNOSIS — F32A Depression, unspecified: Secondary | ICD-10-CM | POA: Diagnosis not present

## 2022-06-22 DIAGNOSIS — F17210 Nicotine dependence, cigarettes, uncomplicated: Secondary | ICD-10-CM | POA: Diagnosis not present

## 2022-06-22 DIAGNOSIS — Z299 Encounter for prophylactic measures, unspecified: Secondary | ICD-10-CM | POA: Diagnosis not present

## 2022-06-22 DIAGNOSIS — F909 Attention-deficit hyperactivity disorder, unspecified type: Secondary | ICD-10-CM | POA: Diagnosis not present

## 2022-06-22 DIAGNOSIS — I10 Essential (primary) hypertension: Secondary | ICD-10-CM | POA: Diagnosis not present

## 2022-07-05 DIAGNOSIS — M6702 Short Achilles tendon (acquired), left ankle: Secondary | ICD-10-CM | POA: Diagnosis not present

## 2022-07-05 DIAGNOSIS — M216X2 Other acquired deformities of left foot: Secondary | ICD-10-CM | POA: Diagnosis not present

## 2022-07-05 DIAGNOSIS — M25572 Pain in left ankle and joints of left foot: Secondary | ICD-10-CM | POA: Diagnosis not present

## 2022-07-05 DIAGNOSIS — M21372 Foot drop, left foot: Secondary | ICD-10-CM | POA: Diagnosis not present

## 2022-07-20 DIAGNOSIS — R2681 Unsteadiness on feet: Secondary | ICD-10-CM | POA: Diagnosis not present

## 2022-07-20 DIAGNOSIS — I69354 Hemiplegia and hemiparesis following cerebral infarction affecting left non-dominant side: Secondary | ICD-10-CM | POA: Diagnosis not present

## 2022-07-20 DIAGNOSIS — Z299 Encounter for prophylactic measures, unspecified: Secondary | ICD-10-CM | POA: Diagnosis not present

## 2022-07-20 DIAGNOSIS — I10 Essential (primary) hypertension: Secondary | ICD-10-CM | POA: Diagnosis not present

## 2022-07-20 DIAGNOSIS — I672 Cerebral atherosclerosis: Secondary | ICD-10-CM | POA: Diagnosis not present

## 2022-07-21 DIAGNOSIS — R8289 Other abnormal findings on cytological and histological examination of urine: Secondary | ICD-10-CM | POA: Diagnosis not present

## 2022-07-21 DIAGNOSIS — R000 Tachycardia, unspecified: Secondary | ICD-10-CM | POA: Diagnosis not present

## 2022-07-21 DIAGNOSIS — G9389 Other specified disorders of brain: Secondary | ICD-10-CM | POA: Diagnosis not present

## 2022-07-21 DIAGNOSIS — Z720 Tobacco use: Secondary | ICD-10-CM | POA: Diagnosis not present

## 2022-07-21 DIAGNOSIS — B961 Klebsiella pneumoniae [K. pneumoniae] as the cause of diseases classified elsewhere: Secondary | ICD-10-CM | POA: Diagnosis not present

## 2022-07-21 DIAGNOSIS — K0889 Other specified disorders of teeth and supporting structures: Secondary | ICD-10-CM | POA: Diagnosis not present

## 2022-07-21 DIAGNOSIS — I71012 Dissection of descending thoracic aorta: Secondary | ICD-10-CM | POA: Diagnosis not present

## 2022-07-21 DIAGNOSIS — F332 Major depressive disorder, recurrent severe without psychotic features: Secondary | ICD-10-CM | POA: Diagnosis not present

## 2022-07-21 DIAGNOSIS — R059 Cough, unspecified: Secondary | ICD-10-CM | POA: Diagnosis not present

## 2022-07-21 DIAGNOSIS — Z79899 Other long term (current) drug therapy: Secondary | ICD-10-CM | POA: Diagnosis not present

## 2022-07-21 DIAGNOSIS — F17200 Nicotine dependence, unspecified, uncomplicated: Secondary | ICD-10-CM | POA: Diagnosis not present

## 2022-07-21 DIAGNOSIS — N390 Urinary tract infection, site not specified: Secondary | ICD-10-CM | POA: Diagnosis not present

## 2022-07-21 DIAGNOSIS — G8110 Spastic hemiplegia affecting unspecified side: Secondary | ICD-10-CM | POA: Diagnosis not present

## 2022-07-21 DIAGNOSIS — F329 Major depressive disorder, single episode, unspecified: Secondary | ICD-10-CM | POA: Diagnosis not present

## 2022-07-21 DIAGNOSIS — Z7982 Long term (current) use of aspirin: Secondary | ICD-10-CM | POA: Diagnosis not present

## 2022-07-21 DIAGNOSIS — M6281 Muscle weakness (generalized): Secondary | ICD-10-CM | POA: Diagnosis not present

## 2022-07-21 DIAGNOSIS — I672 Cerebral atherosclerosis: Secondary | ICD-10-CM | POA: Diagnosis not present

## 2022-07-21 DIAGNOSIS — I712 Thoracic aortic aneurysm, without rupture, unspecified: Secondary | ICD-10-CM | POA: Diagnosis not present

## 2022-07-21 DIAGNOSIS — I771 Stricture of artery: Secondary | ICD-10-CM | POA: Diagnosis not present

## 2022-07-21 DIAGNOSIS — K219 Gastro-esophageal reflux disease without esophagitis: Secondary | ICD-10-CM | POA: Diagnosis not present

## 2022-07-21 DIAGNOSIS — R410 Disorientation, unspecified: Secondary | ICD-10-CM | POA: Diagnosis not present

## 2022-07-21 DIAGNOSIS — R8290 Unspecified abnormal findings in urine: Secondary | ICD-10-CM | POA: Diagnosis not present

## 2022-07-21 DIAGNOSIS — B957 Other staphylococcus as the cause of diseases classified elsewhere: Secondary | ICD-10-CM | POA: Diagnosis not present

## 2022-07-21 DIAGNOSIS — F339 Major depressive disorder, recurrent, unspecified: Secondary | ICD-10-CM | POA: Diagnosis not present

## 2022-07-21 DIAGNOSIS — Z959 Presence of cardiac and vascular implant and graft, unspecified: Secondary | ICD-10-CM | POA: Diagnosis not present

## 2022-07-21 DIAGNOSIS — B952 Enterococcus as the cause of diseases classified elsewhere: Secondary | ICD-10-CM | POA: Diagnosis not present

## 2022-07-21 DIAGNOSIS — A412 Sepsis due to unspecified staphylococcus: Secondary | ICD-10-CM | POA: Diagnosis not present

## 2022-07-21 DIAGNOSIS — Z792 Long term (current) use of antibiotics: Secondary | ICD-10-CM | POA: Diagnosis not present

## 2022-07-21 DIAGNOSIS — F32A Depression, unspecified: Secondary | ICD-10-CM | POA: Diagnosis not present

## 2022-07-21 DIAGNOSIS — G479 Sleep disorder, unspecified: Secondary | ICD-10-CM | POA: Diagnosis not present

## 2022-07-21 DIAGNOSIS — F419 Anxiety disorder, unspecified: Secondary | ICD-10-CM | POA: Diagnosis not present

## 2022-07-21 DIAGNOSIS — F1210 Cannabis abuse, uncomplicated: Secondary | ICD-10-CM | POA: Diagnosis not present

## 2022-07-21 DIAGNOSIS — I69354 Hemiplegia and hemiparesis following cerebral infarction affecting left non-dominant side: Secondary | ICD-10-CM | POA: Diagnosis not present

## 2022-07-21 DIAGNOSIS — R2689 Other abnormalities of gait and mobility: Secondary | ICD-10-CM | POA: Diagnosis not present

## 2022-07-21 DIAGNOSIS — I69398 Other sequelae of cerebral infarction: Secondary | ICD-10-CM | POA: Diagnosis not present

## 2022-07-21 DIAGNOSIS — F909 Attention-deficit hyperactivity disorder, unspecified type: Secondary | ICD-10-CM | POA: Diagnosis not present

## 2022-07-21 DIAGNOSIS — I6523 Occlusion and stenosis of bilateral carotid arteries: Secondary | ICD-10-CM | POA: Diagnosis not present

## 2022-07-21 DIAGNOSIS — F17210 Nicotine dependence, cigarettes, uncomplicated: Secondary | ICD-10-CM | POA: Diagnosis not present

## 2022-07-21 DIAGNOSIS — I7771 Dissection of carotid artery: Secondary | ICD-10-CM | POA: Diagnosis not present

## 2022-07-21 DIAGNOSIS — R45851 Suicidal ideations: Secondary | ICD-10-CM | POA: Diagnosis not present

## 2022-07-21 DIAGNOSIS — N3000 Acute cystitis without hematuria: Secondary | ICD-10-CM | POA: Diagnosis not present

## 2022-07-21 DIAGNOSIS — R4589 Other symptoms and signs involving emotional state: Secondary | ICD-10-CM | POA: Diagnosis not present

## 2022-07-21 DIAGNOSIS — E876 Hypokalemia: Secondary | ICD-10-CM | POA: Diagnosis not present

## 2022-07-21 DIAGNOSIS — I517 Cardiomegaly: Secondary | ICD-10-CM | POA: Diagnosis not present

## 2022-07-21 DIAGNOSIS — Z681 Body mass index (BMI) 19 or less, adult: Secondary | ICD-10-CM | POA: Diagnosis not present

## 2022-07-21 DIAGNOSIS — B9689 Other specified bacterial agents as the cause of diseases classified elsewhere: Secondary | ICD-10-CM | POA: Diagnosis not present

## 2022-07-21 DIAGNOSIS — I10 Essential (primary) hypertension: Secondary | ICD-10-CM | POA: Diagnosis not present

## 2022-07-21 DIAGNOSIS — D649 Anemia, unspecified: Secondary | ICD-10-CM | POA: Diagnosis not present

## 2022-07-21 DIAGNOSIS — Z8673 Personal history of transient ischemic attack (TIA), and cerebral infarction without residual deficits: Secondary | ICD-10-CM | POA: Diagnosis not present

## 2022-07-21 DIAGNOSIS — Z952 Presence of prosthetic heart valve: Secondary | ICD-10-CM | POA: Diagnosis not present

## 2022-07-21 DIAGNOSIS — G243 Spasmodic torticollis: Secondary | ICD-10-CM | POA: Diagnosis not present

## 2022-07-21 DIAGNOSIS — E43 Unspecified severe protein-calorie malnutrition: Secondary | ICD-10-CM | POA: Diagnosis not present

## 2022-07-21 DIAGNOSIS — G8194 Hemiplegia, unspecified affecting left nondominant side: Secondary | ICD-10-CM | POA: Diagnosis not present

## 2022-07-21 DIAGNOSIS — R7881 Bacteremia: Secondary | ICD-10-CM | POA: Diagnosis not present

## 2022-07-21 DIAGNOSIS — Z8659 Personal history of other mental and behavioral disorders: Secondary | ICD-10-CM | POA: Diagnosis not present

## 2022-08-16 DIAGNOSIS — Z792 Long term (current) use of antibiotics: Secondary | ICD-10-CM | POA: Diagnosis not present

## 2022-08-16 DIAGNOSIS — Z959 Presence of cardiac and vascular implant and graft, unspecified: Secondary | ICD-10-CM | POA: Diagnosis not present

## 2022-08-16 DIAGNOSIS — R7881 Bacteremia: Secondary | ICD-10-CM | POA: Diagnosis not present

## 2022-08-16 DIAGNOSIS — Z299 Encounter for prophylactic measures, unspecified: Secondary | ICD-10-CM | POA: Diagnosis not present

## 2022-08-16 DIAGNOSIS — G2581 Restless legs syndrome: Secondary | ICD-10-CM | POA: Diagnosis not present

## 2022-08-16 DIAGNOSIS — I10 Essential (primary) hypertension: Secondary | ICD-10-CM | POA: Diagnosis not present

## 2022-08-16 DIAGNOSIS — I71012 Dissection of descending thoracic aorta: Secondary | ICD-10-CM | POA: Diagnosis not present

## 2022-08-16 DIAGNOSIS — Z952 Presence of prosthetic heart valve: Secondary | ICD-10-CM | POA: Diagnosis not present

## 2022-08-16 DIAGNOSIS — I69354 Hemiplegia and hemiparesis following cerebral infarction affecting left non-dominant side: Secondary | ICD-10-CM | POA: Diagnosis not present

## 2022-08-16 DIAGNOSIS — F32A Depression, unspecified: Secondary | ICD-10-CM | POA: Diagnosis not present

## 2022-08-16 DIAGNOSIS — F332 Major depressive disorder, recurrent severe without psychotic features: Secondary | ICD-10-CM | POA: Diagnosis not present

## 2022-08-16 DIAGNOSIS — F17210 Nicotine dependence, cigarettes, uncomplicated: Secondary | ICD-10-CM | POA: Diagnosis not present

## 2022-08-16 DIAGNOSIS — G243 Spasmodic torticollis: Secondary | ICD-10-CM | POA: Diagnosis not present

## 2022-08-16 DIAGNOSIS — G8110 Spastic hemiplegia affecting unspecified side: Secondary | ICD-10-CM | POA: Diagnosis not present

## 2022-08-16 DIAGNOSIS — R2689 Other abnormalities of gait and mobility: Secondary | ICD-10-CM | POA: Diagnosis not present

## 2022-08-16 DIAGNOSIS — M6281 Muscle weakness (generalized): Secondary | ICD-10-CM | POA: Diagnosis not present

## 2022-08-16 DIAGNOSIS — F909 Attention-deficit hyperactivity disorder, unspecified type: Secondary | ICD-10-CM | POA: Diagnosis not present

## 2022-08-16 DIAGNOSIS — Z681 Body mass index (BMI) 19 or less, adult: Secondary | ICD-10-CM | POA: Diagnosis not present

## 2022-08-16 DIAGNOSIS — F419 Anxiety disorder, unspecified: Secondary | ICD-10-CM | POA: Diagnosis not present

## 2022-08-16 DIAGNOSIS — A412 Sepsis due to unspecified staphylococcus: Secondary | ICD-10-CM | POA: Diagnosis not present

## 2022-08-19 DIAGNOSIS — F32A Depression, unspecified: Secondary | ICD-10-CM | POA: Diagnosis not present

## 2022-08-19 DIAGNOSIS — I10 Essential (primary) hypertension: Secondary | ICD-10-CM | POA: Diagnosis not present

## 2022-08-19 DIAGNOSIS — G2581 Restless legs syndrome: Secondary | ICD-10-CM | POA: Diagnosis not present

## 2022-08-19 DIAGNOSIS — F909 Attention-deficit hyperactivity disorder, unspecified type: Secondary | ICD-10-CM | POA: Diagnosis not present

## 2022-08-19 DIAGNOSIS — Z299 Encounter for prophylactic measures, unspecified: Secondary | ICD-10-CM | POA: Diagnosis not present

## 2022-08-31 ENCOUNTER — Telehealth: Payer: Self-pay | Admitting: Neurology

## 2022-08-31 NOTE — Telephone Encounter (Signed)
Pt called in wanting to talk with a Child psychotherapist. I let him know we do not have one at the moment. He would like to find out what he should do from here since we do not have one?

## 2022-08-31 NOTE — Telephone Encounter (Signed)
Pt said he is looking for programs for help he stated that he has talked to the social security office as well as the governmental center with no help. He is asking if you know of any other programs?

## 2022-09-03 NOTE — Telephone Encounter (Signed)
Not sure if he has tried [facility], he can try calling   Adult Placement [phone]  Provides case management services to community residents seeking placement into either a nursing home or adult care home when they are unable to remain in their current living situations.

## 2022-09-06 NOTE — Telephone Encounter (Signed)
Pt called given the number to   [facility], he can try calling    Adult Placement [phone]   Provides case management services to community residents seeking placement into either a nursing home or adult care home when they are unable to remain in their current living situations.  Pt lives in [place] but they may be able to direct him in the right direction ,

## 2022-09-21 DIAGNOSIS — I69354 Hemiplegia and hemiparesis following cerebral infarction affecting left non-dominant side: Secondary | ICD-10-CM | POA: Diagnosis not present

## 2022-09-21 DIAGNOSIS — I10 Essential (primary) hypertension: Secondary | ICD-10-CM | POA: Diagnosis not present

## 2022-09-21 DIAGNOSIS — Z299 Encounter for prophylactic measures, unspecified: Secondary | ICD-10-CM | POA: Diagnosis not present

## 2022-09-21 DIAGNOSIS — F339 Major depressive disorder, recurrent, unspecified: Secondary | ICD-10-CM | POA: Diagnosis not present

## 2022-09-21 DIAGNOSIS — F909 Attention-deficit hyperactivity disorder, unspecified type: Secondary | ICD-10-CM | POA: Diagnosis not present

## 2022-09-22 DIAGNOSIS — F909 Attention-deficit hyperactivity disorder, unspecified type: Secondary | ICD-10-CM | POA: Diagnosis not present

## 2022-09-22 DIAGNOSIS — Z952 Presence of prosthetic heart valve: Secondary | ICD-10-CM | POA: Diagnosis not present

## 2022-09-22 DIAGNOSIS — I10 Essential (primary) hypertension: Secondary | ICD-10-CM | POA: Diagnosis not present

## 2022-09-22 DIAGNOSIS — F17200 Nicotine dependence, unspecified, uncomplicated: Secondary | ICD-10-CM | POA: Diagnosis not present

## 2022-09-22 DIAGNOSIS — G2581 Restless legs syndrome: Secondary | ICD-10-CM | POA: Diagnosis not present

## 2022-09-22 DIAGNOSIS — G243 Spasmodic torticollis: Secondary | ICD-10-CM | POA: Diagnosis not present

## 2022-09-22 DIAGNOSIS — A412 Sepsis due to unspecified staphylococcus: Secondary | ICD-10-CM | POA: Diagnosis not present

## 2022-09-22 DIAGNOSIS — K2100 Gastro-esophageal reflux disease with esophagitis, without bleeding: Secondary | ICD-10-CM | POA: Diagnosis not present

## 2022-09-22 DIAGNOSIS — I69354 Hemiplegia and hemiparesis following cerebral infarction affecting left non-dominant side: Secondary | ICD-10-CM | POA: Diagnosis not present

## 2022-09-22 DIAGNOSIS — R45851 Suicidal ideations: Secondary | ICD-10-CM | POA: Diagnosis not present

## 2022-09-22 DIAGNOSIS — F1011 Alcohol abuse, in remission: Secondary | ICD-10-CM | POA: Diagnosis not present

## 2022-09-22 DIAGNOSIS — G4700 Insomnia, unspecified: Secondary | ICD-10-CM | POA: Diagnosis not present

## 2022-09-22 DIAGNOSIS — R569 Unspecified convulsions: Secondary | ICD-10-CM | POA: Diagnosis not present

## 2022-09-22 DIAGNOSIS — I71012 Dissection of descending thoracic aorta: Secondary | ICD-10-CM | POA: Diagnosis not present

## 2022-10-07 DIAGNOSIS — F339 Major depressive disorder, recurrent, unspecified: Secondary | ICD-10-CM | POA: Diagnosis not present

## 2022-10-07 DIAGNOSIS — Z299 Encounter for prophylactic measures, unspecified: Secondary | ICD-10-CM | POA: Diagnosis not present

## 2022-10-07 DIAGNOSIS — I69354 Hemiplegia and hemiparesis following cerebral infarction affecting left non-dominant side: Secondary | ICD-10-CM | POA: Diagnosis not present

## 2022-10-07 DIAGNOSIS — F909 Attention-deficit hyperactivity disorder, unspecified type: Secondary | ICD-10-CM | POA: Diagnosis not present

## 2022-10-07 DIAGNOSIS — I10 Essential (primary) hypertension: Secondary | ICD-10-CM | POA: Diagnosis not present

## 2022-10-13 DIAGNOSIS — K2100 Gastro-esophageal reflux disease with esophagitis, without bleeding: Secondary | ICD-10-CM | POA: Diagnosis not present

## 2022-10-13 DIAGNOSIS — A412 Sepsis due to unspecified staphylococcus: Secondary | ICD-10-CM | POA: Diagnosis not present

## 2022-10-13 DIAGNOSIS — G243 Spasmodic torticollis: Secondary | ICD-10-CM | POA: Diagnosis not present

## 2022-10-13 DIAGNOSIS — G4700 Insomnia, unspecified: Secondary | ICD-10-CM | POA: Diagnosis not present

## 2022-10-13 DIAGNOSIS — F1011 Alcohol abuse, in remission: Secondary | ICD-10-CM | POA: Diagnosis not present

## 2022-10-13 DIAGNOSIS — I10 Essential (primary) hypertension: Secondary | ICD-10-CM | POA: Diagnosis not present

## 2022-10-13 DIAGNOSIS — I71012 Dissection of descending thoracic aorta: Secondary | ICD-10-CM | POA: Diagnosis not present

## 2022-10-13 DIAGNOSIS — G2581 Restless legs syndrome: Secondary | ICD-10-CM | POA: Diagnosis not present

## 2022-10-13 DIAGNOSIS — I69354 Hemiplegia and hemiparesis following cerebral infarction affecting left non-dominant side: Secondary | ICD-10-CM | POA: Diagnosis not present

## 2022-10-13 DIAGNOSIS — R45851 Suicidal ideations: Secondary | ICD-10-CM | POA: Diagnosis not present

## 2022-10-13 DIAGNOSIS — F17200 Nicotine dependence, unspecified, uncomplicated: Secondary | ICD-10-CM | POA: Diagnosis not present

## 2022-10-13 DIAGNOSIS — F909 Attention-deficit hyperactivity disorder, unspecified type: Secondary | ICD-10-CM | POA: Diagnosis not present

## 2022-10-13 DIAGNOSIS — R569 Unspecified convulsions: Secondary | ICD-10-CM | POA: Diagnosis not present

## 2022-10-13 DIAGNOSIS — Z952 Presence of prosthetic heart valve: Secondary | ICD-10-CM | POA: Diagnosis not present

## 2022-10-21 DIAGNOSIS — F17200 Nicotine dependence, unspecified, uncomplicated: Secondary | ICD-10-CM | POA: Diagnosis not present

## 2022-10-21 DIAGNOSIS — G2581 Restless legs syndrome: Secondary | ICD-10-CM | POA: Diagnosis not present

## 2022-10-21 DIAGNOSIS — R45851 Suicidal ideations: Secondary | ICD-10-CM | POA: Diagnosis not present

## 2022-10-21 DIAGNOSIS — G4700 Insomnia, unspecified: Secondary | ICD-10-CM | POA: Diagnosis not present

## 2022-10-21 DIAGNOSIS — G243 Spasmodic torticollis: Secondary | ICD-10-CM | POA: Diagnosis not present

## 2022-10-21 DIAGNOSIS — I10 Essential (primary) hypertension: Secondary | ICD-10-CM | POA: Diagnosis not present

## 2022-10-21 DIAGNOSIS — F909 Attention-deficit hyperactivity disorder, unspecified type: Secondary | ICD-10-CM | POA: Diagnosis not present

## 2022-10-21 DIAGNOSIS — I69354 Hemiplegia and hemiparesis following cerebral infarction affecting left non-dominant side: Secondary | ICD-10-CM | POA: Diagnosis not present

## 2022-10-21 DIAGNOSIS — Z952 Presence of prosthetic heart valve: Secondary | ICD-10-CM | POA: Diagnosis not present

## 2022-10-21 DIAGNOSIS — R569 Unspecified convulsions: Secondary | ICD-10-CM | POA: Diagnosis not present

## 2022-10-21 DIAGNOSIS — K2100 Gastro-esophageal reflux disease with esophagitis, without bleeding: Secondary | ICD-10-CM | POA: Diagnosis not present

## 2022-10-21 DIAGNOSIS — F1011 Alcohol abuse, in remission: Secondary | ICD-10-CM | POA: Diagnosis not present

## 2022-10-21 DIAGNOSIS — A412 Sepsis due to unspecified staphylococcus: Secondary | ICD-10-CM | POA: Diagnosis not present

## 2022-10-21 DIAGNOSIS — I71012 Dissection of descending thoracic aorta: Secondary | ICD-10-CM | POA: Diagnosis not present

## 2022-10-22 DIAGNOSIS — I69354 Hemiplegia and hemiparesis following cerebral infarction affecting left non-dominant side: Secondary | ICD-10-CM | POA: Diagnosis not present

## 2022-10-22 DIAGNOSIS — F17200 Nicotine dependence, unspecified, uncomplicated: Secondary | ICD-10-CM | POA: Diagnosis not present

## 2022-10-22 DIAGNOSIS — R45851 Suicidal ideations: Secondary | ICD-10-CM | POA: Diagnosis not present

## 2022-10-22 DIAGNOSIS — I71012 Dissection of descending thoracic aorta: Secondary | ICD-10-CM | POA: Diagnosis not present

## 2022-10-22 DIAGNOSIS — R569 Unspecified convulsions: Secondary | ICD-10-CM | POA: Diagnosis not present

## 2022-10-22 DIAGNOSIS — Z952 Presence of prosthetic heart valve: Secondary | ICD-10-CM | POA: Diagnosis not present

## 2022-10-22 DIAGNOSIS — F1011 Alcohol abuse, in remission: Secondary | ICD-10-CM | POA: Diagnosis not present

## 2022-10-22 DIAGNOSIS — K2100 Gastro-esophageal reflux disease with esophagitis, without bleeding: Secondary | ICD-10-CM | POA: Diagnosis not present

## 2022-10-22 DIAGNOSIS — G2581 Restless legs syndrome: Secondary | ICD-10-CM | POA: Diagnosis not present

## 2022-10-22 DIAGNOSIS — A412 Sepsis due to unspecified staphylococcus: Secondary | ICD-10-CM | POA: Diagnosis not present

## 2022-10-22 DIAGNOSIS — F909 Attention-deficit hyperactivity disorder, unspecified type: Secondary | ICD-10-CM | POA: Diagnosis not present

## 2022-10-22 DIAGNOSIS — G4700 Insomnia, unspecified: Secondary | ICD-10-CM | POA: Diagnosis not present

## 2022-10-22 DIAGNOSIS — I10 Essential (primary) hypertension: Secondary | ICD-10-CM | POA: Diagnosis not present

## 2022-10-22 DIAGNOSIS — G243 Spasmodic torticollis: Secondary | ICD-10-CM | POA: Diagnosis not present

## 2022-11-01 DIAGNOSIS — I10 Essential (primary) hypertension: Secondary | ICD-10-CM | POA: Diagnosis not present

## 2022-11-01 DIAGNOSIS — F909 Attention-deficit hyperactivity disorder, unspecified type: Secondary | ICD-10-CM | POA: Diagnosis not present

## 2022-11-01 DIAGNOSIS — I69354 Hemiplegia and hemiparesis following cerebral infarction affecting left non-dominant side: Secondary | ICD-10-CM | POA: Diagnosis not present

## 2022-11-08 DIAGNOSIS — I351 Nonrheumatic aortic (valve) insufficiency: Secondary | ICD-10-CM | POA: Diagnosis not present

## 2022-11-08 DIAGNOSIS — I7100 Dissection of unspecified site of aorta: Secondary | ICD-10-CM | POA: Diagnosis not present

## 2022-11-08 DIAGNOSIS — Z952 Presence of prosthetic heart valve: Secondary | ICD-10-CM | POA: Diagnosis not present

## 2022-11-15 DIAGNOSIS — I69354 Hemiplegia and hemiparesis following cerebral infarction affecting left non-dominant side: Secondary | ICD-10-CM | POA: Diagnosis not present

## 2022-11-15 DIAGNOSIS — R262 Difficulty in walking, not elsewhere classified: Secondary | ICD-10-CM | POA: Diagnosis not present

## 2022-11-15 DIAGNOSIS — Z993 Dependence on wheelchair: Secondary | ICD-10-CM | POA: Diagnosis not present

## 2022-11-22 DIAGNOSIS — I10 Essential (primary) hypertension: Secondary | ICD-10-CM | POA: Diagnosis not present

## 2022-11-22 DIAGNOSIS — F909 Attention-deficit hyperactivity disorder, unspecified type: Secondary | ICD-10-CM | POA: Diagnosis not present

## 2022-11-22 DIAGNOSIS — I69354 Hemiplegia and hemiparesis following cerebral infarction affecting left non-dominant side: Secondary | ICD-10-CM | POA: Diagnosis not present

## 2022-11-26 DIAGNOSIS — Z742 Need for assistance at home and no other household member able to render care: Secondary | ICD-10-CM | POA: Diagnosis not present

## 2022-12-18 DIAGNOSIS — K2100 Gastro-esophageal reflux disease with esophagitis, without bleeding: Secondary | ICD-10-CM | POA: Diagnosis not present

## 2022-12-18 DIAGNOSIS — Z20822 Contact with and (suspected) exposure to covid-19: Secondary | ICD-10-CM | POA: Diagnosis not present

## 2022-12-18 DIAGNOSIS — I10 Essential (primary) hypertension: Secondary | ICD-10-CM | POA: Diagnosis not present

## 2022-12-18 DIAGNOSIS — J439 Emphysema, unspecified: Secondary | ICD-10-CM | POA: Diagnosis not present

## 2022-12-18 DIAGNOSIS — Z79899 Other long term (current) drug therapy: Secondary | ICD-10-CM | POA: Diagnosis not present

## 2022-12-18 DIAGNOSIS — I71019 Dissection of thoracic aorta, unspecified: Secondary | ICD-10-CM | POA: Diagnosis not present

## 2022-12-18 DIAGNOSIS — R0781 Pleurodynia: Secondary | ICD-10-CM | POA: Diagnosis not present

## 2022-12-18 DIAGNOSIS — R0602 Shortness of breath: Secondary | ICD-10-CM | POA: Diagnosis not present

## 2022-12-18 DIAGNOSIS — Z7951 Long term (current) use of inhaled steroids: Secondary | ICD-10-CM | POA: Diagnosis not present

## 2022-12-18 DIAGNOSIS — F17210 Nicotine dependence, cigarettes, uncomplicated: Secondary | ICD-10-CM | POA: Diagnosis not present

## 2022-12-18 DIAGNOSIS — R457 State of emotional shock and stress, unspecified: Secondary | ICD-10-CM | POA: Diagnosis not present

## 2022-12-18 DIAGNOSIS — F32A Depression, unspecified: Secondary | ICD-10-CM | POA: Diagnosis not present

## 2022-12-18 DIAGNOSIS — I7123 Aneurysm of the descending thoracic aorta, without rupture: Secondary | ICD-10-CM | POA: Diagnosis not present

## 2022-12-22 DIAGNOSIS — G4700 Insomnia, unspecified: Secondary | ICD-10-CM | POA: Diagnosis not present

## 2022-12-22 DIAGNOSIS — F909 Attention-deficit hyperactivity disorder, unspecified type: Secondary | ICD-10-CM | POA: Diagnosis not present

## 2022-12-22 DIAGNOSIS — G2581 Restless legs syndrome: Secondary | ICD-10-CM | POA: Diagnosis not present

## 2022-12-22 DIAGNOSIS — F339 Major depressive disorder, recurrent, unspecified: Secondary | ICD-10-CM | POA: Diagnosis not present

## 2023-01-20 DIAGNOSIS — F909 Attention-deficit hyperactivity disorder, unspecified type: Secondary | ICD-10-CM | POA: Diagnosis not present

## 2023-01-20 DIAGNOSIS — I10 Essential (primary) hypertension: Secondary | ICD-10-CM | POA: Diagnosis not present

## 2023-01-20 DIAGNOSIS — G2581 Restless legs syndrome: Secondary | ICD-10-CM | POA: Diagnosis not present

## 2023-02-01 ENCOUNTER — Inpatient Hospital Stay: Admit: 2023-02-01 | Payer: PPO | Admitting: Critical Care Medicine

## 2023-02-01 ENCOUNTER — Inpatient Hospital Stay: Admit: 2023-02-01 | Payer: Medicaid Other | Admitting: Pulmonary Disease

## 2023-02-01 ENCOUNTER — Encounter (HOSPITAL_COMMUNITY): Payer: Self-pay

## 2023-02-01 ENCOUNTER — Encounter: Payer: Self-pay | Admitting: Pulmonary Disease

## 2023-02-01 DIAGNOSIS — J439 Emphysema, unspecified: Secondary | ICD-10-CM | POA: Diagnosis not present

## 2023-02-01 DIAGNOSIS — F17210 Nicotine dependence, cigarettes, uncomplicated: Secondary | ICD-10-CM | POA: Diagnosis not present

## 2023-02-01 DIAGNOSIS — I7101 Dissection of ascending aorta: Secondary | ICD-10-CM | POA: Diagnosis not present

## 2023-02-01 DIAGNOSIS — R7989 Other specified abnormal findings of blood chemistry: Secondary | ICD-10-CM | POA: Diagnosis not present

## 2023-02-01 DIAGNOSIS — K922 Gastrointestinal hemorrhage, unspecified: Secondary | ICD-10-CM | POA: Diagnosis not present

## 2023-02-01 DIAGNOSIS — J189 Pneumonia, unspecified organism: Secondary | ICD-10-CM | POA: Diagnosis not present

## 2023-02-01 DIAGNOSIS — Z4682 Encounter for fitting and adjustment of non-vascular catheter: Secondary | ICD-10-CM | POA: Diagnosis not present

## 2023-02-01 DIAGNOSIS — R52 Pain, unspecified: Secondary | ICD-10-CM | POA: Diagnosis not present

## 2023-02-01 DIAGNOSIS — F32A Depression, unspecified: Secondary | ICD-10-CM | POA: Diagnosis not present

## 2023-02-01 DIAGNOSIS — K6389 Other specified diseases of intestine: Secondary | ICD-10-CM | POA: Diagnosis not present

## 2023-02-01 DIAGNOSIS — I10 Essential (primary) hypertension: Secondary | ICD-10-CM | POA: Diagnosis not present

## 2023-02-01 DIAGNOSIS — Z79899 Other long term (current) drug therapy: Secondary | ICD-10-CM | POA: Diagnosis not present

## 2023-02-01 DIAGNOSIS — F1290 Cannabis use, unspecified, uncomplicated: Secondary | ICD-10-CM | POA: Diagnosis not present

## 2023-02-01 DIAGNOSIS — I4901 Ventricular fibrillation: Secondary | ICD-10-CM | POA: Diagnosis not present

## 2023-02-01 DIAGNOSIS — E876 Hypokalemia: Secondary | ICD-10-CM | POA: Diagnosis not present

## 2023-02-01 DIAGNOSIS — R0902 Hypoxemia: Secondary | ICD-10-CM | POA: Diagnosis not present

## 2023-02-01 DIAGNOSIS — R1084 Generalized abdominal pain: Secondary | ICD-10-CM | POA: Diagnosis not present

## 2023-02-01 NOTE — Progress Notes (Addendum)
PCCM note  Received call from Dr. Sherryll Burger, ED at [facility]  Pt with h/o etoh abuse, CVA with 3 days of melena. Accepted from [facility]  to ICU at [facility] for GIB, sepsis, possible NSTEMI   Chilton Greathouse MD [facility] 02/01/2023, 1:27 PM

## 2023-02-02 ENCOUNTER — Inpatient Hospital Stay (HOSPITAL_COMMUNITY): Payer: PPO

## 2023-02-02 ENCOUNTER — Inpatient Hospital Stay (HOSPITAL_COMMUNITY)
Admission: RE | Admit: 2023-02-02 | Discharge: 2023-02-19 | DRG: 870 | Disposition: E | Payer: PPO | Source: Other Acute Inpatient Hospital | Attending: Internal Medicine | Admitting: Internal Medicine

## 2023-02-02 DIAGNOSIS — I4901 Ventricular fibrillation: Secondary | ICD-10-CM | POA: Diagnosis not present

## 2023-02-02 DIAGNOSIS — I5022 Chronic systolic (congestive) heart failure: Secondary | ICD-10-CM | POA: Diagnosis not present

## 2023-02-02 DIAGNOSIS — F319 Bipolar disorder, unspecified: Secondary | ICD-10-CM | POA: Diagnosis present

## 2023-02-02 DIAGNOSIS — K922 Gastrointestinal hemorrhage, unspecified: Principal | ICD-10-CM | POA: Diagnosis present

## 2023-02-02 DIAGNOSIS — R54 Age-related physical debility: Secondary | ICD-10-CM | POA: Diagnosis present

## 2023-02-02 DIAGNOSIS — N390 Urinary tract infection, site not specified: Secondary | ICD-10-CM | POA: Diagnosis present

## 2023-02-02 DIAGNOSIS — Z681 Body mass index (BMI) 19 or less, adult: Secondary | ICD-10-CM

## 2023-02-02 DIAGNOSIS — I6349 Cerebral infarction due to embolism of other cerebral artery: Secondary | ICD-10-CM | POA: Diagnosis not present

## 2023-02-02 DIAGNOSIS — Y900 Blood alcohol level of less than 20 mg/100 ml: Secondary | ICD-10-CM | POA: Diagnosis present

## 2023-02-02 DIAGNOSIS — F1910 Other psychoactive substance abuse, uncomplicated: Secondary | ICD-10-CM | POA: Diagnosis present

## 2023-02-02 DIAGNOSIS — Z515 Encounter for palliative care: Secondary | ICD-10-CM | POA: Diagnosis not present

## 2023-02-02 DIAGNOSIS — Z952 Presence of prosthetic heart valve: Secondary | ICD-10-CM | POA: Diagnosis not present

## 2023-02-02 DIAGNOSIS — R6520 Severe sepsis without septic shock: Secondary | ICD-10-CM | POA: Diagnosis present

## 2023-02-02 DIAGNOSIS — I110 Hypertensive heart disease with heart failure: Secondary | ICD-10-CM | POA: Diagnosis present

## 2023-02-02 DIAGNOSIS — Z79899 Other long term (current) drug therapy: Secondary | ICD-10-CM

## 2023-02-02 DIAGNOSIS — E876 Hypokalemia: Secondary | ICD-10-CM

## 2023-02-02 DIAGNOSIS — I639 Cerebral infarction, unspecified: Secondary | ICD-10-CM | POA: Diagnosis not present

## 2023-02-02 DIAGNOSIS — I469 Cardiac arrest, cause unspecified: Secondary | ICD-10-CM | POA: Diagnosis not present

## 2023-02-02 DIAGNOSIS — J9690 Respiratory failure, unspecified, unspecified whether with hypoxia or hypercapnia: Secondary | ICD-10-CM | POA: Diagnosis not present

## 2023-02-02 DIAGNOSIS — I69354 Hemiplegia and hemiparesis following cerebral infarction affecting left non-dominant side: Secondary | ICD-10-CM | POA: Diagnosis not present

## 2023-02-02 DIAGNOSIS — F17210 Nicotine dependence, cigarettes, uncomplicated: Secondary | ICD-10-CM | POA: Diagnosis present

## 2023-02-02 DIAGNOSIS — Z66 Do not resuscitate: Secondary | ICD-10-CM | POA: Diagnosis present

## 2023-02-02 DIAGNOSIS — R4182 Altered mental status, unspecified: Secondary | ICD-10-CM

## 2023-02-02 DIAGNOSIS — G931 Anoxic brain damage, not elsewhere classified: Secondary | ICD-10-CM | POA: Diagnosis present

## 2023-02-02 DIAGNOSIS — E43 Unspecified severe protein-calorie malnutrition: Secondary | ICD-10-CM | POA: Insufficient documentation

## 2023-02-02 DIAGNOSIS — Z9911 Dependence on respirator [ventilator] status: Secondary | ICD-10-CM

## 2023-02-02 DIAGNOSIS — A4181 Sepsis due to Enterococcus: Secondary | ICD-10-CM | POA: Diagnosis not present

## 2023-02-02 DIAGNOSIS — E873 Alkalosis: Secondary | ICD-10-CM | POA: Diagnosis not present

## 2023-02-02 DIAGNOSIS — K219 Gastro-esophageal reflux disease without esophagitis: Secondary | ICD-10-CM | POA: Diagnosis present

## 2023-02-02 DIAGNOSIS — R64 Cachexia: Secondary | ICD-10-CM | POA: Diagnosis not present

## 2023-02-02 DIAGNOSIS — R918 Other nonspecific abnormal finding of lung field: Secondary | ICD-10-CM | POA: Diagnosis not present

## 2023-02-02 DIAGNOSIS — F1010 Alcohol abuse, uncomplicated: Secondary | ICD-10-CM | POA: Diagnosis not present

## 2023-02-02 DIAGNOSIS — I743 Embolism and thrombosis of arteries of the lower extremities: Secondary | ICD-10-CM | POA: Diagnosis present

## 2023-02-02 DIAGNOSIS — Z8249 Family history of ischemic heart disease and other diseases of the circulatory system: Secondary | ICD-10-CM

## 2023-02-02 DIAGNOSIS — I493 Ventricular premature depolarization: Secondary | ICD-10-CM | POA: Diagnosis not present

## 2023-02-02 DIAGNOSIS — R1310 Dysphagia, unspecified: Secondary | ICD-10-CM | POA: Diagnosis present

## 2023-02-02 DIAGNOSIS — K529 Noninfective gastroenteritis and colitis, unspecified: Secondary | ICD-10-CM | POA: Diagnosis present

## 2023-02-02 DIAGNOSIS — I63119 Cerebral infarction due to embolism of unspecified vertebral artery: Secondary | ICD-10-CM | POA: Diagnosis not present

## 2023-02-02 DIAGNOSIS — I71019 Dissection of thoracic aorta, unspecified: Secondary | ICD-10-CM | POA: Diagnosis not present

## 2023-02-02 DIAGNOSIS — G9389 Other specified disorders of brain: Secondary | ICD-10-CM | POA: Diagnosis not present

## 2023-02-02 DIAGNOSIS — G9340 Encephalopathy, unspecified: Secondary | ICD-10-CM

## 2023-02-02 DIAGNOSIS — Z953 Presence of xenogenic heart valve: Secondary | ICD-10-CM | POA: Diagnosis not present

## 2023-02-02 DIAGNOSIS — F419 Anxiety disorder, unspecified: Secondary | ICD-10-CM | POA: Diagnosis present

## 2023-02-02 DIAGNOSIS — I6310 Cerebral infarction due to embolism of unspecified precerebral artery: Secondary | ICD-10-CM | POA: Diagnosis not present

## 2023-02-02 DIAGNOSIS — I6340 Cerebral infarction due to embolism of unspecified cerebral artery: Secondary | ICD-10-CM | POA: Diagnosis not present

## 2023-02-02 DIAGNOSIS — I70201 Unspecified atherosclerosis of native arteries of extremities, right leg: Secondary | ICD-10-CM | POA: Diagnosis present

## 2023-02-02 DIAGNOSIS — J9601 Acute respiratory failure with hypoxia: Secondary | ICD-10-CM | POA: Diagnosis not present

## 2023-02-02 DIAGNOSIS — I462 Cardiac arrest due to underlying cardiac condition: Secondary | ICD-10-CM | POA: Diagnosis not present

## 2023-02-02 DIAGNOSIS — H109 Unspecified conjunctivitis: Secondary | ICD-10-CM | POA: Diagnosis present

## 2023-02-02 DIAGNOSIS — I429 Cardiomyopathy, unspecified: Secondary | ICD-10-CM

## 2023-02-02 DIAGNOSIS — D62 Acute posthemorrhagic anemia: Secondary | ICD-10-CM | POA: Diagnosis not present

## 2023-02-02 DIAGNOSIS — F909 Attention-deficit hyperactivity disorder, unspecified type: Secondary | ICD-10-CM | POA: Diagnosis present

## 2023-02-02 DIAGNOSIS — J9600 Acute respiratory failure, unspecified whether with hypoxia or hypercapnia: Secondary | ICD-10-CM | POA: Diagnosis not present

## 2023-02-02 DIAGNOSIS — R569 Unspecified convulsions: Secondary | ICD-10-CM

## 2023-02-02 DIAGNOSIS — R262 Difficulty in walking, not elsewhere classified: Secondary | ICD-10-CM | POA: Diagnosis present

## 2023-02-02 DIAGNOSIS — R609 Edema, unspecified: Secondary | ICD-10-CM | POA: Diagnosis not present

## 2023-02-02 DIAGNOSIS — J690 Pneumonitis due to inhalation of food and vomit: Secondary | ICD-10-CM | POA: Diagnosis not present

## 2023-02-02 DIAGNOSIS — Z4659 Encounter for fitting and adjustment of other gastrointestinal appliance and device: Secondary | ICD-10-CM | POA: Diagnosis not present

## 2023-02-02 DIAGNOSIS — M21372 Foot drop, left foot: Secondary | ICD-10-CM | POA: Diagnosis not present

## 2023-02-02 DIAGNOSIS — I82611 Acute embolism and thrombosis of superficial veins of right upper extremity: Secondary | ICD-10-CM | POA: Diagnosis present

## 2023-02-02 DIAGNOSIS — Z4682 Encounter for fitting and adjustment of non-vascular catheter: Secondary | ICD-10-CM | POA: Diagnosis not present

## 2023-02-02 DIAGNOSIS — I75029 Atheroembolism of unspecified lower extremity: Secondary | ICD-10-CM | POA: Diagnosis not present

## 2023-02-02 LAB — URINE CULTURE

## 2023-02-02 LAB — ECHOCARDIOGRAM COMPLETE
AR max vel: 1.29 cm2
AV Area VTI: 1.21 cm2
AV Area mean vel: 1.24 cm2
AV Mean grad: 13 mmHg
AV Peak grad: 24.6 mmHg
Ao pk vel: 2.48 m/s
Area-P 1/2: 3.6 cm2
Calc EF: 42.3 %
Est EF: 40
Height: 72 in
S' Lateral: 4.2 cm
Single Plane A2C EF: 25.2 %
Single Plane A4C EF: 54.6 %
Weight: 2296.31 oz

## 2023-02-02 LAB — GLUCOSE, CAPILLARY
Glucose-Capillary: 158 mg/dL — ABNORMAL HIGH (ref 70–99)
Glucose-Capillary: 126 mg/dL — ABNORMAL HIGH (ref 70–99)
Glucose-Capillary: 114 mg/dL — ABNORMAL HIGH (ref 70–99)
Glucose-Capillary: 143 mg/dL — ABNORMAL HIGH (ref 70–99)
Glucose-Capillary: 105 mg/dL — ABNORMAL HIGH (ref 70–99)
Glucose-Capillary: 112 mg/dL — ABNORMAL HIGH (ref 70–99)
Glucose-Capillary: 74 mg/dL (ref 70–99)

## 2023-02-02 LAB — ABO/RH: ABO/RH(D): O POS

## 2023-02-02 LAB — RAPID URINE DRUG SCREEN, HOSP PERFORMED
Amphetamines: NOT DETECTED
Barbiturates: NOT DETECTED
Benzodiazepines: NOT DETECTED
Cocaine: NOT DETECTED
Opiates: NOT DETECTED
Tetrahydrocannabinol: NOT DETECTED

## 2023-02-02 LAB — URINALYSIS, W/ REFLEX TO CULTURE (INFECTION SUSPECTED)
Bilirubin Urine: NEGATIVE
Glucose, UA: NEGATIVE mg/dL
Ketones, ur: NEGATIVE mg/dL
Nitrite: NEGATIVE
Protein, ur: NEGATIVE mg/dL
Specific Gravity, Urine: >1.046 — ABNORMAL HIGH (ref 1.005–1.030)
WBC, UA: >50 WBC/hpf (ref 0–5)
pH: 6 (ref 5.0–8.0)

## 2023-02-02 LAB — PHOSPHORUS
Phosphorus: 2.2 mg/dL — ABNORMAL LOW (ref 2.5–4.6)
Phosphorus: 4 mg/dL (ref 2.5–4.6)

## 2023-02-02 LAB — CULTURE, RESPIRATORY W GRAM STAIN

## 2023-02-02 LAB — POCT I-STAT 7, (LYTES, BLD GAS, ICA,H+H)
Acid-base deficit: 4 mmol/L — ABNORMAL HIGH (ref 0.0–2.0)
Bicarbonate: 18.8 mmol/L — ABNORMAL LOW (ref 20.0–28.0)
Calcium, Ion: 1.01 mmol/L — ABNORMAL LOW (ref 1.15–1.40)
HCT: 33 % — ABNORMAL LOW (ref 39.0–52.0)
Hemoglobin: 11.2 g/dL — ABNORMAL LOW (ref 13.0–17.0)
O2 Saturation: 100 %
Patient temperature: 36.9
Potassium: <2 mmol/L — CL (ref 3.5–5.1)
Sodium: 138 mmol/L (ref 135–145)
TCO2: 20 mmol/L — ABNORMAL LOW (ref 22–32)
pCO2 arterial: 25.4 mmHg — ABNORMAL LOW (ref 32–48)
pH, Arterial: 7.478 — ABNORMAL HIGH (ref 7.35–7.45)
pO2, Arterial: 163 mmHg — ABNORMAL HIGH (ref 83–108)

## 2023-02-02 LAB — BASIC METABOLIC PANEL
Anion gap: 11 (ref 5–15)
Anion gap: 12 (ref 5–15)
Anion gap: 9 (ref 5–15)
BUN: 10 mg/dL (ref 6–20)
BUN: 9 mg/dL (ref 6–20)
BUN: 9 mg/dL (ref 6–20)
CO2: 21 mmol/L — ABNORMAL LOW (ref 22–32)
CO2: 20 mmol/L — ABNORMAL LOW (ref 22–32)
CO2: 22 mmol/L (ref 22–32)
Calcium: 6.4 mg/dL — CL (ref 8.9–10.3)
Calcium: 7 mg/dL — ABNORMAL LOW (ref 8.9–10.3)
Calcium: 8.4 mg/dL — ABNORMAL LOW (ref 8.9–10.3)
Chloride: 103 mmol/L (ref 98–111)
Chloride: 104 mmol/L (ref 98–111)
Chloride: 106 mmol/L (ref 98–111)
Creatinine, Ser: 1.14 mg/dL (ref 0.61–1.24)
Creatinine, Ser: 1.06 mg/dL (ref 0.61–1.24)
Creatinine, Ser: 0.93 mg/dL (ref 0.61–1.24)
GFR, Estimated: >60 mL/min (ref >60)
GFR, Estimated: >60 mL/min (ref >60)
GFR, Estimated: >60 mL/min (ref >60)
Glucose, Bld: 189 mg/dL — ABNORMAL HIGH (ref 70–99)
Glucose, Bld: 139 mg/dL — ABNORMAL HIGH (ref 70–99)
Glucose, Bld: 129 mg/dL — ABNORMAL HIGH (ref 70–99)
Potassium: 2.1 mmol/L — CL (ref 3.5–5.1)
Potassium: <2 mmol/L — CL (ref 3.5–5.1)
Potassium: 4.8 mmol/L (ref 3.5–5.1)
Sodium: 135 mmol/L (ref 135–145)
Sodium: 136 mmol/L (ref 135–145)
Sodium: 137 mmol/L (ref 135–145)

## 2023-02-02 LAB — BLOOD GAS, ARTERIAL
Acid-base deficit: 1.4 mmol/L (ref 0.0–2.0)
Bicarbonate: 20.3 mmol/L (ref 20.0–28.0)
O2 Saturation: 99.4 %
Patient temperature: 37
pCO2 arterial: 26 mmHg — ABNORMAL LOW (ref 32–48)
pH, Arterial: 7.5 — ABNORMAL HIGH (ref 7.35–7.45)
pO2, Arterial: 177 mmHg — ABNORMAL HIGH (ref 83–108)

## 2023-02-02 LAB — AMMONIA: Ammonia: 32 umol/L (ref 9–35)

## 2023-02-02 LAB — TROPONIN I (HIGH SENSITIVITY)
Troponin I (High Sensitivity): 477 ng/L (ref <18)
Troponin I (High Sensitivity): 434 ng/L (ref <18)

## 2023-02-02 LAB — CBC
HCT: 30.7 % — ABNORMAL LOW (ref 39.0–52.0)
Hemoglobin: 10.5 g/dL — ABNORMAL LOW (ref 13.0–17.0)
MCH: 26.6 pg (ref 26.0–34.0)
MCHC: 34.2 g/dL (ref 30.0–36.0)
MCV: 77.7 fL — ABNORMAL LOW (ref 80.0–100.0)
Platelets: 217 10*3/uL (ref 150–400)
RBC: 3.95 MIL/uL — ABNORMAL LOW (ref 4.22–5.81)
RDW: 18.5 % — ABNORMAL HIGH (ref 11.5–15.5)
WBC: 14.1 10*3/uL — ABNORMAL HIGH (ref 4.0–10.5)
nRBC: 0 % (ref 0.0–0.2)

## 2023-02-02 LAB — MAGNESIUM
Magnesium: 1.3 mg/dL — ABNORMAL LOW (ref 1.7–2.4)
Magnesium: 2.4 mg/dL (ref 1.7–2.4)

## 2023-02-02 LAB — LACTIC ACID, PLASMA
Lactic Acid, Venous: 2.6 mmol/L (ref 0.5–1.9)
Lactic Acid, Venous: 1.4 mmol/L (ref 0.5–1.9)

## 2023-02-02 LAB — ETHANOL: Alcohol, Ethyl (B): <10 mg/dL (ref <10)

## 2023-02-02 LAB — TYPE AND SCREEN
ABO/RH(D): O POS
Antibody Screen: NEGATIVE

## 2023-02-02 LAB — APTT: aPTT: 41 seconds — ABNORMAL HIGH (ref 24–36)

## 2023-02-02 LAB — MRSA NEXT GEN BY PCR, NASAL: MRSA by PCR Next Gen: NOT DETECTED

## 2023-02-02 LAB — HEMOGLOBIN A1C
Hgb A1c MFr Bld: 5.1 % (ref 4.8–5.6)
Mean Plasma Glucose: 99.67 mg/dL

## 2023-02-02 LAB — HIV ANTIBODY (ROUTINE TESTING W REFLEX): HIV Screen 4th Generation wRfx: NONREACTIVE

## 2023-02-02 LAB — VALPROIC ACID LEVEL: Valproic Acid Lvl: <10 ug/mL — ABNORMAL LOW (ref 50.0–100.0)

## 2023-02-02 LAB — BRAIN NATRIURETIC PEPTIDE: B Natriuretic Peptide: 1346.9 pg/mL — ABNORMAL HIGH (ref 0.0–100.0)

## 2023-02-02 LAB — PROTIME-INR
INR: 2.2 — ABNORMAL HIGH (ref 0.8–1.2)
Prothrombin Time: 24.6 seconds — ABNORMAL HIGH (ref 11.4–15.2)

## 2023-02-02 MED ORDER — POTASSIUM CHLORIDE 20 MEQ PO PACK
40.0000 meq | PACK | Freq: Once | ORAL | Status: AC
  Administered 2023-02-02: 40 meq
  Filled 2023-02-02: qty 2

## 2023-02-02 MED ORDER — CALCIUM GLUCONATE-NACL 2-0.675 GM/100ML-% IV SOLN
2.0000 g | Freq: Once | INTRAVENOUS | Status: AC
  Administered 2023-02-02: 2000 mg via INTRAVENOUS
  Filled 2023-02-02: qty 100

## 2023-02-02 MED ORDER — VITAL 1.5 CAL PO LIQD
1000.0000 mL | ORAL | Status: DC
  Administered 2023-02-02: 1000 mL

## 2023-02-02 MED ORDER — MAGNESIUM SULFATE 2 GM/50ML IV SOLN
2.0000 g | Freq: Once | INTRAVENOUS | Status: AC
  Administered 2023-02-02: 4 g via INTRAVENOUS
  Filled 2023-02-02: qty 50

## 2023-02-02 MED ORDER — FOLIC ACID 1 MG PO TABS
1.0000 mg | ORAL_TABLET | Freq: Every day | ORAL | Status: DC
  Administered 2023-02-02 – 2023-02-07 (×6): 1 mg
  Filled 2023-02-02 (×6): qty 1

## 2023-02-02 MED ORDER — AMIODARONE HCL IN DEXTROSE 360-4.14 MG/200ML-% IV SOLN
60.0000 mg/h | INTRAVENOUS | Status: AC
  Administered 2023-02-02: 60 mg/h via INTRAVENOUS
  Filled 2023-02-02 (×2): qty 200

## 2023-02-02 MED ORDER — CALCIUM GLUCONATE-NACL 1-0.675 GM/50ML-% IV SOLN
1.0000 g | Freq: Once | INTRAVENOUS | Status: AC
  Administered 2023-02-02: 1000 mg via INTRAVENOUS
  Filled 2023-02-02: qty 50

## 2023-02-02 MED ORDER — DOCUSATE SODIUM 50 MG/5ML PO LIQD
100.0000 mg | Freq: Two times a day (BID) | ORAL | Status: DC
  Administered 2023-02-06 – 2023-02-07 (×3): 100 mg
  Filled 2023-02-02 (×6): qty 10

## 2023-02-02 MED ORDER — FENTANYL 2500MCG IN NS 250ML (10MCG/ML) PREMIX INFUSION
50.0000 ug/h | INTRAVENOUS | Status: DC
  Administered 2023-02-02 – 2023-02-05 (×2): 50 ug/h via INTRAVENOUS
  Administered 2023-02-06: 150 ug/h via INTRAVENOUS
  Administered 2023-02-07: 100 ug/h via INTRAVENOUS
  Filled 2023-02-02 (×4): qty 250

## 2023-02-02 MED ORDER — SODIUM CHLORIDE 0.9% FLUSH
10.0000 mL | Freq: Two times a day (BID) | INTRAVENOUS | Status: DC
  Administered 2023-02-02 – 2023-02-07 (×10): 10 mL
  Administered 2023-02-07: 20 mL

## 2023-02-02 MED ORDER — PANTOPRAZOLE SODIUM 40 MG IV SOLR
40.0000 mg | Freq: Two times a day (BID) | INTRAVENOUS | Status: DC
  Administered 2023-02-02 – 2023-02-06 (×11): 40 mg via INTRAVENOUS
  Filled 2023-02-02 (×11): qty 10

## 2023-02-02 MED ORDER — THIAMINE HCL 100 MG/ML IJ SOLN: 100.0000 mg | Freq: Every day | INTRAMUSCULAR | Status: DC

## 2023-02-02 MED ORDER — POLYETHYLENE GLYCOL 3350 17 G PO PACK
17.0000 g | PACK | Freq: Every day | ORAL | Status: DC
  Administered 2023-02-06 – 2023-02-07 (×2): 17 g
  Filled 2023-02-02 (×2): qty 1

## 2023-02-02 MED ORDER — ORAL CARE MOUTH RINSE
15.0000 mL | OROMUCOSAL | Status: DC | PRN
  Administered 2023-02-08: 15 mL via OROMUCOSAL

## 2023-02-02 MED ORDER — PROSOURCE TF20 ENFIT COMPATIBL EN LIQD
60.0000 mL | Freq: Every day | ENTERAL | Status: DC
  Administered 2023-02-02 – 2023-02-07 (×6): 60 mL
  Filled 2023-02-02 (×6): qty 60

## 2023-02-02 MED ORDER — POTASSIUM CHLORIDE 10 MEQ/50ML IV SOLN
10.0000 meq | INTRAVENOUS | Status: AC
  Administered 2023-02-02 (×2): 10 meq via INTRAVENOUS
  Filled 2023-02-02 (×2): qty 50

## 2023-02-02 MED ORDER — FENTANYL CITRATE PF 50 MCG/ML IJ SOSY
50.0000 ug | PREFILLED_SYRINGE | Freq: Once | INTRAMUSCULAR | Status: AC
  Administered 2023-02-02: 50 ug via INTRAVENOUS
  Filled 2023-02-02: qty 1

## 2023-02-02 MED ORDER — POLYETHYLENE GLYCOL 3350 17 G PO PACK: 17.0000 g | PACK | Freq: Every day | ORAL | Status: DC

## 2023-02-02 MED ORDER — CHLORHEXIDINE GLUCONATE CLOTH 2 % EX PADS
6.0000 | MEDICATED_PAD | Freq: Every day | CUTANEOUS | Status: DC
  Administered 2023-02-02 – 2023-02-07 (×8): 6 via TOPICAL

## 2023-02-02 MED ORDER — POTASSIUM CHLORIDE 20 MEQ PO PACK: 80.0000 meq | PACK | Freq: Once | ORAL | Status: DC

## 2023-02-02 MED ORDER — FENTANYL CITRATE PF 50 MCG/ML IJ SOSY
50.0000 ug | PREFILLED_SYRINGE | INTRAMUSCULAR | Status: AC | PRN
  Administered 2023-02-02 (×3): 50 ug via INTRAVENOUS
  Filled 2023-02-02 (×3): qty 1

## 2023-02-02 MED ORDER — DOCUSATE SODIUM 100 MG PO CAPS: 100.0000 mg | ORAL_CAPSULE | Freq: Two times a day (BID) | ORAL | Status: DC | PRN

## 2023-02-02 MED ORDER — DOCUSATE SODIUM 50 MG/5ML PO LIQD: 100.0000 mg | Freq: Two times a day (BID) | ORAL | Status: DC

## 2023-02-02 MED ORDER — THIAMINE MONONITRATE 100 MG PO TABS
100.0000 mg | ORAL_TABLET | Freq: Every day | ORAL | Status: DC
  Administered 2023-02-02 – 2023-02-07 (×6): 100 mg
  Filled 2023-02-02 (×6): qty 1

## 2023-02-02 MED ORDER — FAMOTIDINE 20 MG PO TABS
20.0000 mg | ORAL_TABLET | Freq: Two times a day (BID) | ORAL | Status: DC
  Administered 2023-02-02: 20 mg
  Filled 2023-02-02: qty 1

## 2023-02-02 MED ORDER — POTASSIUM PHOSPHATES 15 MMOLE/5ML IV SOLN
15.0000 mmol | Freq: Once | INTRAVENOUS | Status: AC
  Administered 2023-02-02: 15 mmol via INTRAVENOUS
  Filled 2023-02-02: qty 5

## 2023-02-02 MED ORDER — ORAL CARE MOUTH RINSE
15.0000 mL | OROMUCOSAL | Status: DC
  Administered 2023-02-02 – 2023-02-08 (×68): 15 mL via OROMUCOSAL

## 2023-02-02 MED ORDER — MAGNESIUM SULFATE 4 GM/100ML IV SOLN
4.0000 g | Freq: Once | INTRAVENOUS | Status: AC
  Administered 2023-02-02: 2 g via INTRAVENOUS
  Filled 2023-02-02: qty 100

## 2023-02-02 MED ORDER — POTASSIUM CHLORIDE 20 MEQ PO PACK
40.0000 meq | PACK | ORAL | Status: DC
  Administered 2023-02-02 (×2): 40 meq
  Filled 2023-02-02 (×2): qty 2

## 2023-02-02 MED ORDER — SODIUM CHLORIDE 0.9 % IV SOLN: INTRAVENOUS | Status: DC | PRN

## 2023-02-02 MED ORDER — CALCIUM GLUCONATE-NACL 2-0.675 GM/100ML-% IV SOLN
2.0000 g | Freq: Once | INTRAVENOUS | Status: DC
  Filled 2023-02-02: qty 100

## 2023-02-02 MED ORDER — POTASSIUM PHOSPHATES 15 MMOLE/5ML IV SOLN
30.0000 mmol | Freq: Once | INTRAVENOUS | Status: DC
  Administered 2023-02-02: 30 mmol via INTRAVENOUS
  Filled 2023-02-02: qty 10

## 2023-02-02 MED ORDER — ALBUTEROL SULFATE (2.5 MG/3ML) 0.083% IN NEBU: 2.5000 mg | INHALATION_SOLUTION | RESPIRATORY_TRACT | Status: DC | PRN

## 2023-02-02 MED ORDER — FENTANYL CITRATE PF 50 MCG/ML IJ SOSY
25.0000 ug | PREFILLED_SYRINGE | INTRAMUSCULAR | Status: AC | PRN
  Administered 2023-02-03 – 2023-02-07 (×10): 100 ug via INTRAVENOUS
  Filled 2023-02-02 (×9): qty 2

## 2023-02-02 MED ORDER — POLYETHYLENE GLYCOL 3350 17 G PO PACK: 17.0000 g | PACK | Freq: Every day | ORAL | Status: DC | PRN

## 2023-02-02 MED ORDER — MAGNESIUM SULFATE 2 GM/50ML IV SOLN
2.0000 g | Freq: Once | INTRAVENOUS | Status: AC
  Administered 2023-02-02: 2 g via INTRAVENOUS
  Filled 2023-02-02: qty 50

## 2023-02-02 MED ORDER — DEXMEDETOMIDINE HCL IN NACL 400 MCG/100ML IV SOLN
0.0000 ug/kg/h | INTRAVENOUS | Status: DC
  Administered 2023-02-02: 0.7 ug/kg/h via INTRAVENOUS
  Administered 2023-02-02 – 2023-02-05 (×2): 0.4 ug/kg/h via INTRAVENOUS
  Filled 2023-02-02 (×3): qty 100

## 2023-02-02 MED ORDER — FOLIC ACID 5 MG/ML IJ SOLN
1.0000 mg | Freq: Every day | INTRAMUSCULAR | Status: DC
  Filled 2023-02-02: qty 0.2

## 2023-02-02 MED ORDER — POTASSIUM CHLORIDE 10 MEQ/50ML IV SOLN
10.0000 meq | INTRAVENOUS | Status: DC
  Administered 2023-02-02 (×6): 10 meq via INTRAVENOUS
  Filled 2023-02-02 (×6): qty 50

## 2023-02-02 MED ORDER — INSULIN ASPART 100 UNIT/ML IJ SOLN
0.0000 [IU] | INTRAMUSCULAR | Status: DC
  Administered 2023-02-02 – 2023-02-05 (×3): 1 [IU] via SUBCUTANEOUS

## 2023-02-02 MED ORDER — AMIODARONE HCL IN DEXTROSE 360-4.14 MG/200ML-% IV SOLN
30.0000 mg/h | INTRAVENOUS | Status: DC
  Administered 2023-02-02 – 2023-02-03 (×4): 30 mg/h via INTRAVENOUS
  Filled 2023-02-02 (×3): qty 200

## 2023-02-02 MED ORDER — PIPERACILLIN-TAZOBACTAM 3.375 G IVPB
3.3750 g | Freq: Three times a day (TID) | INTRAVENOUS | Status: DC
  Administered 2023-02-02 – 2023-02-04 (×7): 3.375 g via INTRAVENOUS
  Filled 2023-02-02 (×7): qty 50

## 2023-02-02 MED ORDER — HYDRALAZINE HCL 20 MG/ML IJ SOLN: 10.0000 mg | INTRAMUSCULAR | Status: DC | PRN

## 2023-02-02 MED ORDER — FENTANYL CITRATE PF 50 MCG/ML IJ SOSY
50.0000 ug | PREFILLED_SYRINGE | INTRAMUSCULAR | Status: DC | PRN
  Administered 2023-02-05 – 2023-02-07 (×2): 50 ug via INTRAVENOUS
  Filled 2023-02-02: qty 1

## 2023-02-02 MED ORDER — PROPOFOL 1000 MG/100ML IV EMUL
0.0000 ug/kg/min | INTRAVENOUS | Status: DC
  Administered 2023-02-02: 10 ug/kg/min via INTRAVENOUS
  Filled 2023-02-02 (×2): qty 100

## 2023-02-02 MED ORDER — ADULT MULTIVITAMIN W/MINERALS CH
1.0000 | ORAL_TABLET | Freq: Every day | ORAL | Status: DC
  Administered 2023-02-02 – 2023-02-07 (×6): 1
  Filled 2023-02-02 (×6): qty 1

## 2023-02-02 MED ORDER — SODIUM CHLORIDE 0.9% FLUSH: 10.0000 mL | INTRAVENOUS | Status: DC | PRN

## 2023-02-02 NOTE — Progress Notes (Signed)
Sputum sample collected , labeled and sent to lab w/ requisition. 

## 2023-02-02 NOTE — Progress Notes (Signed)
Noted pt w/ increased WOB noted, increased RR.  RN at bedside and aware, pt was placed back on full vent support, appears to tol well currently.

## 2023-02-02 NOTE — Progress Notes (Signed)
Pharmacy Antibiotic Note  Daniel Arroyo is a 52 y.o. male admitted on 02/02/2023 with concern for aspiration pneumonia s/p Vfib arrest.  Pharmacy has been consulted for Zosyn dosing.    Pertinent labs from OSH:  K 1.6, SCr 1.14, Ca 6.5, WBC 11.3, Hgb 10.2, Plt 202, abnormal UA, trop 163, Mg 1.2.  Plan: Zosyn 3.375g IV q8h (4 hour infusion). (Last dose ~MN at OSH.)  Height: 6' (182.9 cm) Weight: 65.1 kg (143 lb 8.3 oz) IBW/kg (Calculated) : 77.6  Temp (24hrs), Avg:98.9 F (37.2 C), Min:98.8 F (37.1 C), Max:99 F (37.2 C)  Recent Labs  Lab 02/02/23 0219  WBC 14.1*     No Known Allergies   Thank you for allowing pharmacy to be a part of this patient's care.  Vernard Gambles, PharmD, BCPS  02/02/2023 2:41 AM

## 2023-02-02 NOTE — Progress Notes (Addendum)
NAME:  Daniel Arroyo, MRN:  [id number], DOB:  05/07/1971, LOS: 0 ADMISSION DATE:  02/02/2023, CONSULTATION DATE:  02/02/23 REFERRING MD:  EDP, CHIEF COMPLAINT:  cardiac arrest   History of Present Illness:  Daniel Arroyo is a 52 y.o. M with PMH significant for polysubstance abuse and CVA with residual RUE deficits, LLE  injury for which never sought treatment for which never sought treatment and subsequent foot contracture and difficulty ambulating, Type II aortic dissection, bipolar disorder and ADHD who initially presented to [facility] [place] with three days of abdominal pain dark stools.   His potassium was markedly low at 1.6, Hgb WNL. He was treated with octreotide, pantoprazole, antibiotics, and given potassium for his hypokalemia. An abdominal CT showed colitis. He also was found to have aortic dissection which was previously known and unchanged from his previous CT. He then experienced a V-fib cardiac arrest and had CPR for 5 minutes with standard ACLS. He was intubated and briefly required pressors.  CXR with RLL infiltrate. The ED provider spoke with TCTS regarding dissection as follows "Given his history of type II aortic dissection a CT was done of his chest, now showing a type I aortic dissection. The case was discussed with cardio thoracic surgery who found in his records that he has had an aortic arch and valve replacement and therefore is not a surgical candidate. He should be treated as a type II / B dissection"  He was transported to [facility] and arrived hemodynamically stable but not responsive with sedation wean.   Per his father who is at the bedside, pt lives alone and he thinks has been smoking marijuana.  He is unsure about other drugs, does not think he has been drinking a significant amount of alcohol.  Pt is minimally able to care for himself, his father was checking on him 1-2x per day until a couple of weeks ago when pt was verbally abusive, so he stopped coming and has not  seen him recently until today   Pertinent  Medical History   has a past medical history of ADHD, adult residual type, Anxiety and depression, Bipolar disorder (HCC), Depression, Gastroesophageal reflux disease, History of stroke (2018), Knee pain, Polysubstance abuse (HCC), and Stroke (HCC).   Significant Hospital Events: Including procedures, antibiotic start and stop dates in addition to other pertinent events   5/15 transferred from [facility] intubated after brief V-fib arrest, not on pressors   Interim History / Subjective:   CT head no acute abnormality EEG w/ no seizures Echo later today On Amio K, mag, calcium, phosph low being repleted Patient sedate w/ propofol; weak cough/gag present; perrl; no response to painful stimuli Cooling blanket in place Flexiseal in place w/ minimal outpt  Objective   Blood pressure 126/76, pulse 71, temperature 98.1 F (36.7 C), resp. rate 19, height 6' (1.829 m), weight 65.1 kg, SpO2 100 %.    Vent Mode: PRVC FiO2 (%):  [55 %-70 %] 55 % Set Rate:  [12 bmp-14 bmp] 12 bmp Vt Set:  [550 mL] 550 mL PEEP:  [5 cmH20] 5 cmH20   Intake/Output Summary (Last 24 hours) at 02/02/2023 0821 Last data filed at 02/02/2023 0700 Gross per 24 hour  Intake 489.95 ml  Output 350 ml  Net 139.95 ml   Filed Weights   02/02/23 0148 02/02/23 0200  Weight: 65.5 kg 65.1 kg   General:  critically ill appearing on mech vent HEENT: MM pink/moist; ETT in place Neuro: sedate w/ propofol; weak cough/gag  present; perrl; no response to painful stimuli CV: s1s2, RRR, no m/r/g PULM:  dim clear BS bilaterally; on mech vent PRVC GI: soft, bsx4 active  Extremities: warm/dry, no edema; decreased mucle tone Skin: no rashes or lesions    Resolved Hospital Problem list     Assessment & Plan:   Witnessed in hospital V-fib arrest likely secondary to electrolyte abnormalities  Approximately 5 minute code in the ED Hypokalemia Hypomagnesemia  Hypocalcemia Initially  profoundly low K at 1.6 P: -continuous telemetry monitoring -troponin 434 from 477 -check LA -replete electrolytes as needed -serial bmp; trend mag -cont amio drip -echo -check cultures: bcx2, urine culture, and trach culture -cont zosyn for aspiration pneum, colitis, uti -TTM normothermia protocol in place  Acute Encephalopathy Hx of CVA: w/ RUE deficits; Baseline L hemiplegia and poor mobility -CT head no acute abnormality; EEG no seizures; UDS negative Hx of polysubstance abuse and alcohol use P: -concern for anoxic injury post arrest -limit sedating meds -check ammonia and ethanol -thiamine, folic acid, mvi -consider MRI 48-72 hours post arrest if no improvement in mental status -PT/OT when appropriate  Acute Hypoxic Respiratory Failure Likely RLL aspiration PNA P: -abg showing resp alkalosis; settings adjusted; repeat abg -LTVV strategy with tidal volumes of 6-8 cc/kg ideal body weight -Wean PEEP/FiO2 for SpO2 >92% -VAP bundle in place -Daily SAT and SBT -PAD protocol in place -wean sedation for RASS goal 0 to -1 -check trach aspirate -cont zosyn  Sepsis Colitis with reported melena  UTI P: -hgb stable; trend cbc  -cont ppi bid -cont zosyn  -f/u cultures -scds for dvt ppx  Type 1 Aortic dissection AVR HTN Sees vascular and cardiology and [facility]F, had ascending arch and proximal hemiarch replacement due to dissection along with bioprosthetic AVR in 08/2020 -scan reviewed by Dr. Leafy Ro, not a candidate for operative intervention P: -BP control, prn hydralazine -hold home lasix  Protein calorie malnutrition POA P: -TF per RD  Bipolar disorder, ADHD P: -hold home adderall, depakote, remeron, restoril and lexapro  Best Practice (right click and "Reselect all SmartList Selections" daily)   Diet/type: NPO DVT prophylaxis: SCD GI prophylaxis: PPI Lines: Central line Foley:  Yes, and it is still needed Code Status:  full code Last date of  multidisciplinary goals of care discussion [5/15 son [place] updated at bedside. Upset that he was not contacted first when he arrived to [facility]. He states he is the POA and would like to be contacted first for decision making. ]  Labs   CBC: Recent Labs  Lab 02/02/23 0219  WBC 14.1*  HGB 10.5*  HCT 30.7*  MCV 77.7*  PLT 217    Basic Metabolic Panel: Recent Labs  Lab 02/02/23 0219  NA 135  K 2.1*  CL 103  CO2 21*  GLUCOSE 189*  BUN 10  CREATININE 1.14  CALCIUM 6.4*  MG 1.3*  PHOS 2.2*   GFR: Estimated Creatinine Clearance: 69 mL/min (by C-G formula based on SCr of 1.14 mg/dL). Recent Labs  Lab 02/02/23 0219  WBC 14.1*    Liver Function Tests: No results for input(s): "AST", "ALT", "ALKPHOS", "BILITOT", "PROT", "ALBUMIN" in the last 168 hours. No results for input(s): "LIPASE", "AMYLASE" in the last 168 hours. No results for input(s): "AMMONIA" in the last 168 hours.  ABG    Component Value Date/Time   PHART 7.5 (H) 02/02/2023 0241   PCO2ART 26 (L) 02/02/2023 0241   PO2ART 177 (H) 02/02/2023 0241   HCO3 20.3 02/02/2023 0241  ACIDBASEDEF 1.4 02/02/2023 0241   O2SAT 99.4 02/02/2023 0241     Coagulation Profile: Recent Labs  Lab 02/02/23 0236  INR 2.2*    Cardiac Enzymes: No results for input(s): "CKTOTAL", "CKMB", "CKMBINDEX", "TROPONINI" in the last 168 hours.  HbA1C: Hgb A1c MFr Bld  Date/Time Value Ref Range Status  02/02/2023 02:19 AM 5.1 4.8 - 5.6 % Final    Comment:    (NOTE) Pre diabetes:          5.7%-6.4%  Diabetes:              >6.4%  Glycemic control for   <7.0% adults with diabetes   12/14/2013 07:44 PM 4.9 <5.7 % Final    Comment:    (NOTE)                                                                       According to the ADA Clinical Practice Recommendations for 2011, when HbA1c is used as a screening test:  >=6.5%   Diagnostic of Diabetes Mellitus           (if abnormal result is confirmed) 5.7-6.4%   Increased risk of  developing Diabetes Mellitus References:Diagnosis and Classification of Diabetes Mellitus,Diabetes Care,2011,34(Suppl 1):S62-S69 and Standards of Medical Care in         Diabetes - 2011,Diabetes Care,2011,34 (Suppl 1):S11-S61.    CBG: Recent Labs  Lab 02/02/23 0135 02/02/23 0334 02/02/23 0720  GLUCAP 158* 126* 114*     Review of Systems:   Unable to obtain   Past Medical History:  He,  has a past medical history of ADHD, adult residual type, Anxiety and depression, Bipolar disorder (HCC), Depression, Gastroesophageal reflux disease, History of stroke (2018), Knee pain, Polysubstance abuse (HCC), and Stroke (HCC).   Surgical History:   Past Surgical History:  Procedure Laterality Date   AORTA SURGERY N/A    TONSILLECTOMY       Social History:   reports that he has been smoking cigarettes. He has been smoking an average of .5 packs per day. He has never used smokeless tobacco. He reports current alcohol use. He reports that he does not currently use drugs after having used the following drugs: Heroin and Marijuana.   Family History:  His family history includes HIV in his brother; Heart attack in his mother.   Allergies No Known Allergies   Home Medications  Prior to Admission medications   Medication Sig Start Date End Date Taking? Authorizing Provider  albuterol (VENTOLIN HFA) 108 (90 Base) MCG/ACT inhaler As directed 01/28/20   [provider]  amphetamine-dextroamphetamine (ADDERALL XR) 30 MG 24 hr capsule Take 30 mg by mouth in the morning and at bedtime. 03/08/19   [provider]  cyclobenzaprine (FLEXERIL) 10 MG tablet Take 10 mg by mouth daily as needed. 03/06/20   [provider]  divalproex (DEPAKOTE) 250 MG DR tablet Take 1 tab in AM, 2 tabs in PM 02/19/22   Van Clines, MD  escitalopram (LEXAPRO) 10 MG tablet Take 10 mg by mouth daily. 02/14/20   [provider]  fluticasone (FLONASE) 50 MCG/ACT nasal spray Place 1 spray into  both nostrils daily. 12/28/19   [provider]  furosemide (LASIX) 20 MG  tablet TAKE 1 TABLET BY MOUTH DAILY 11/29/19   Jonelle Sidle, MD  mirtazapine (REMERON) 15 MG tablet Take 1 tablet (15 mg total) by mouth at bedtime. 02/21/17   Oneta Rack, NP  mirtazapine (REMERON) 30 MG tablet Take 30 mg by mouth at bedtime. 08/09/20   [provider]  omeprazole (PRILOSEC) 20 MG capsule Take 20 mg by mouth daily.    [provider]  tadalafil (CIALIS) 20 MG tablet Take 20 mg by mouth daily. 05/02/20   [provider]  temazepam (RESTORIL) 15 MG capsule Take 1 capsule by mouth at bedtime. 03/05/20   [provider]     Critical care time:  40 minutes     [facility] 02/02/2023, 9:47 AM  Please see [email or web address] for pager details.  From 7A-7P if no response, please call [phone]. After hours, please call ELink [phone].

## 2023-02-02 NOTE — Progress Notes (Signed)
EEG complete - results pending 

## 2023-02-02 NOTE — H&P (Signed)
NAME:  Daniel Arroyo, MRN:  [id number], DOB:  12/06/1970, LOS: 0 ADMISSION DATE:  02/02/2023, CONSULTATION DATE:  02/02/23 REFERRING MD:  EDP, CHIEF COMPLAINT:  cardiac arrest   History of Present Illness:  Daniel Arroyo is a 52 y.o. M with PMH significant for polysubstance abuse and CVA with residual RUE deficits, LLE  injury for which never sought treatment for which never sought treatment and subsequent foot contracture and difficulty ambulating, Type II aortic dissection, bipolar disorder and ADHD who initially presented to [facility] [place] with three days of abdominal pain dark stools.   His potassium was markedly low at 1.6, Hgb WNL. He was treated with octreotide, pantoprazole, antibiotics, and given potassium for his hypokalemia. An abdominal CT showed colitis. He also was found to have aortic dissection which was previously known and unchanged from his previous CT. He then experienced a V-fib cardiac arrest and had CPR for 5 minutes with standard ACLS. He was intubated and briefly required pressors.  CXR with RLL infiltrate. The ED provider spoke with TCTS regarding dissection as follows "Given his history of type II aortic dissection a CT was done of his chest, now showing a type I aortic dissection. The case was discussed with cardio thoracic surgery who found in his records that he has had an aortic arch and valve replacement and therefore is not a surgical candidate. He should be treated as a type II / B dissection"   He was transported to [facility] and arrived hemodynamically stable but not responsive with sedation wean.   Per his father who is at the bedside, pt lives alone and he thinks has been smoking marijuana.  He is unsure about other drugs, does not think he has been drinking a significant amount of alcohol.  Pt is minimally able to care for himself, his father was checking on him 1-2x per day until a couple of weeks ago when pt was verbally abusive, so he stopped coming and has not  seen him recently until today   Pertinent  Medical History   has a past medical history of ADHD, adult residual type, Anxiety and depression, Bipolar disorder (HCC), Depression, Gastroesophageal reflux disease, History of stroke (2018), Knee pain, Polysubstance abuse (HCC), and Stroke (HCC).   Significant Hospital Events: Including procedures, antibiotic start and stop dates in addition to other pertinent events   5/15 transferred from [facility] intubated after brief V-fib arrest, not on pressors   Interim History / Subjective:  Pt is unresponsive but hemodynamically stable on arrival   Objective   Blood pressure 130/79, pulse 76, temperature 98.8 F (37.1 C), resp. rate (!) 22, height 6' (1.829 m), weight 65.1 kg, SpO2 100 %.    Vent Mode: PRVC FiO2 (%):  [70 %] 70 % Set Rate:  [14 bmp] 14 bmp Vt Set:  [550 mL] 550 mL PEEP:  [5 cmH20] 5 cmH20  No intake or output data in the 24 hours ending 02/02/23 0229 Filed Weights   02/02/23 0148 02/02/23 0200  Weight: 65.5 kg 65.1 kg   General:  thin, poorly nourished M intubated and sedated HEENT: MM pink/moist, pupils 4mm bilaterally and responsive to light  Neuro: examined off sedation, triggering vent, unresponsive to pain, minimal gag  CV: s1s2 rrr, no m/r/g PULM:  mechanically ventilated without significant rhonchi or wheezing, synchronous with vent  GI: soft, non-distended  Extremities: warm/dry, no edema, decreased muscle tone and bulk Skin: no rashes or lesions   Resolved Hospital Problem list  Assessment & Plan:   Witnessed in hospital V-fib arrest likely secondary to electrolyte abnormalities  Acute Encephalopathy Acute Hypoxic Respiratory Failure, likely aspiration PNA Approximately 5 minute code in the ED -CTH, EEG -normothermia protocol -repeat electrolytes and replete  -echo -amiodarone gtt -cycle troponin and consider heparin gtt if benefit outweighs risk -propofol and fentanyl for PAD protocol -blood  cultures and start zosyn for PNA --Maintain full vent support with SAT/SBT as tolerated -titrate Vent setting to maintain SpO2 greater than or equal to 90%. -HOB elevated 30 degrees. -Plateau pressures less than 30 cm H20.  -Follow chest x-ray, ABG prn.   -Bronchial hygiene and RT/bronchodilator protocol.     Hypokalemia Hypomagnesemia  Hypocalcemia Initially profoundly low K at 1.6 -repeat BMP and mag and replete prn   Colitis with reported melena  UTI -zosyn, blood and urine cultures -Hgb stable, trend and transfuse for Hgb <7, coags pending   Type 1 Aortic dissection AVR HTN Sees vascular and cardiology and WF, had ascending arch and proximal hemiarch replacement due to dissection along with bioprosthetic AVR in 08/2020 -scan reviewed by Dr. Leafy Ro, not a candidate for operative intervention -BP control, prn hydralazine ordered    History of CVA  Baseline L hemiplegia and poor mobility -PT/OT consults when appropriate    Protein calorie malnutrition POA -will need TF and nutrition consult, hold now as going for CT    Bipolar disorder, ADHD -holding home adderall, depakote, remeron, restoril and lexapro in the setting of acute encephalopathy  Best Practice (right click and "Reselect all SmartList Selections" daily)   Diet/type: NPO DVT prophylaxis: SCD GI prophylaxis: PPI Lines: Central line Foley:  Yes, and it is still needed Code Status:  full code Last date of multidisciplinary goals of care discussion [pending, father updated at the bedside ]  Labs   CBC: No results for input(s): "WBC", "NEUTROABS", "HGB", "HCT", "MCV", "PLT" in the last 168 hours.  Basic Metabolic Panel: No results for input(s): "NA", "K", "CL", "CO2", "GLUCOSE", "BUN", "CREATININE", "CALCIUM", "MG", "PHOS" in the last 168 hours. GFR: CrCl cannot be calculated (Patient's most recent lab result is older than the maximum 21 days allowed.). No results for input(s): "PROCALCITON",  "WBC", "LATICACIDVEN" in the last 168 hours.  Liver Function Tests: No results for input(s): "AST", "ALT", "ALKPHOS", "BILITOT", "PROT", "ALBUMIN" in the last 168 hours. No results for input(s): "LIPASE", "AMYLASE" in the last 168 hours. No results for input(s): "AMMONIA" in the last 168 hours.  ABG No results found for: "PHART", "PCO2ART", "PO2ART", "HCO3", "TCO2", "ACIDBASEDEF", "O2SAT"   Coagulation Profile: No results for input(s): "INR", "PROTIME" in the last 168 hours.  Cardiac Enzymes: No results for input(s): "CKTOTAL", "CKMB", "CKMBINDEX", "TROPONINI" in the last 168 hours.  HbA1C: Hgb A1c MFr Bld  Date/Time Value Ref Range Status  12/14/2013 07:44 PM 4.9 <5.7 % Final    Comment:    (NOTE)                                                                       According to the ADA Clinical Practice Recommendations for 2011, when HbA1c is used as a screening test:  >=6.5%   Diagnostic of Diabetes Mellitus           (  if abnormal result is confirmed) 5.7-6.4%   Increased risk of developing Diabetes Mellitus References:Diagnosis and Classification of Diabetes Mellitus,Diabetes Care,2011,34(Suppl 1):S62-S69 and Standards of Medical Care in         Diabetes - 2011,Diabetes Care,2011,34 (Suppl 1):S11-S61.    CBG: Recent Labs  Lab 02/02/23 0135  GLUCAP 158*    Review of Systems:   Unable to obtain   Past Medical History:  He,  has a past medical history of ADHD, adult residual type, Anxiety and depression, Bipolar disorder (HCC), Depression, Gastroesophageal reflux disease, History of stroke (2018), Knee pain, Polysubstance abuse (HCC), and Stroke (HCC).   Surgical History:   Past Surgical History:  Procedure Laterality Date   AORTA SURGERY N/A    TONSILLECTOMY       Social History:   reports that he has been smoking cigarettes. He has been smoking an average of .5 packs per day. He has never used smokeless tobacco. He reports current alcohol use. He reports that  he does not currently use drugs after having used the following drugs: Heroin and Marijuana.   Family History:  His family history includes HIV in his brother; Heart attack in his mother.   Allergies No Known Allergies   Home Medications  Prior to Admission medications   Medication Sig Start Date End Date Taking? Authorizing Provider  albuterol (VENTOLIN HFA) 108 (90 Base) MCG/ACT inhaler As directed 01/28/20   [provider]  amphetamine-dextroamphetamine (ADDERALL XR) 30 MG 24 hr capsule Take 30 mg by mouth in the morning and at bedtime. 03/08/19   [provider]  cyclobenzaprine (FLEXERIL) 10 MG tablet Take 10 mg by mouth daily as needed. 03/06/20   [provider]  divalproex (DEPAKOTE) 250 MG DR tablet Take 1 tab in AM, 2 tabs in PM 02/19/22   Van Clines, MD  escitalopram (LEXAPRO) 10 MG tablet Take 10 mg by mouth daily. 02/14/20   [provider]  fluticasone (FLONASE) 50 MCG/ACT nasal spray Place 1 spray into both nostrils daily. 12/28/19   [provider]  furosemide (LASIX) 20 MG tablet TAKE 1 TABLET BY MOUTH DAILY 11/29/19   Jonelle Sidle, MD  mirtazapine (REMERON) 15 MG tablet Take 1 tablet (15 mg total) by mouth at bedtime. 02/21/17   Oneta Rack, NP  mirtazapine (REMERON) 30 MG tablet Take 30 mg by mouth at bedtime. 08/09/20   [provider]  omeprazole (PRILOSEC) 20 MG capsule Take 20 mg by mouth daily.    [provider]  tadalafil (CIALIS) 20 MG tablet Take 20 mg by mouth daily. 05/02/20   [provider]  temazepam (RESTORIL) 15 MG capsule Take 1 capsule by mouth at bedtime. 03/05/20   [provider]     Critical care time:  55 minutes     CRITICAL CARE Performed by: Darcella Gasman Elsie Sakuma   Total critical care time: 55 minutes  Critical care time was exclusive of separately billable procedures and treating other patients.  Critical care was necessary to treat or prevent imminent or  life-threatening deterioration.  Critical care was time spent personally by me on the following activities: development of treatment plan with patient and/or surrogate as well as nursing, discussions with consultants, evaluation of patient's response to treatment, examination of patient, obtaining history from patient or surrogate, ordering and performing treatments and interventions, ordering and review of laboratory studies, ordering and review of radiographic studies, pulse oximetry and re-evaluation of patient's condition.   Darcella Gasman Clarnce Homan,  PA-C [facility] See Amion for pager If no response to pager , please call [phone] until 7pm After 7:00 pm call Elink  [phone]

## 2023-02-02 NOTE — Progress Notes (Signed)
eLink Physician-Brief Progress Note Patient Name: Daniel Arroyo DOB: [birth year] MRN: [id number]   Date of Service  02/02/2023  HPI/Events of Note  K+ 2.1, Corrected Ca++ 7.8, Mg++ 1.3, PHOS 2.2, Cr 1.14, GFR >  60.  eICU Interventions  Electrolytes repleted per E-Link electrolyte replacement protocol.        Daniel Arroyo 02/02/2023, 3:38 AM

## 2023-02-02 NOTE — Progress Notes (Signed)
Per discussion with CCM PA, ETT placement is satisfactory (per prev. Cxray) & no need to adjust placement at this time.

## 2023-02-02 NOTE — Progress Notes (Signed)
Initial Nutrition Assessment  DOCUMENTATION CODES:   Severe malnutrition in context of social or environmental circumstances  INTERVENTION:   - Plan to exchange OG tube for Cortrak today  Initiate trickle tube feeds via Cortrak tube once placement confirmed with abdominal x-ray: - Vital 1.5 @ 20 ml/hr (480 ml/day)  Monitor magnesium, potassium, and phosphorus q 12 hours for at least 6 occurrences, MD to replete as needed, as pt is at risk for refeeding syndrome given severe malnutrition, current electrolyte abnormalities.   RD will monitor for ability to advance tube feeding regimen to goal: - Vital 1.5 @ 55 ml/hr (1320 ml/day) - PROSource TF20 60 ml daily  Recommended tube feeding regimen at goal rate provides 2060 kcal, 109 grams of protein, and 1008 ml of H2O.   - Continue MVI with minerals, folic acid 1 mg daily, and thiamine 100 mg daily per tube  NUTRITION DIAGNOSIS:   Severe Malnutrition related to social / environmental circumstances (polysubstance abuse) as evidenced by severe fat depletion, severe muscle depletion.  GOAL:   Patient will meet greater than or equal to 90% of their needs  MONITOR:   Vent status, Labs, Weight trends, TF tolerance, I & O's  REASON FOR ASSESSMENT:   Ventilator, Consult Enteral/tube feeding initiation and management, Assessment of nutrition requirement/status  ASSESSMENT:   52 year old male who presented on 5/15 from [facility] after a V-fib cardiac arrest. PMH of polysubstance abuse, CVA with residual RUE deficits, LLE injury, type II aortic dissection, bipolar disorder, ADHD. Pt admitted to [facility] after witnessed V-fib cardiac arrest with electrolyte abnormalities and colitis.  Discussed pt with RN and during ICU rounds. Consult received for assessment and for enteral nutrition initiation and management. Pt with OG tube tip positioned at the level of the GE junction and side hole above the GE junction. Radiology recommending  advancement. Discussed pt with CCM MD. Plan to exchange OG tube for Cortrak today.  Pt with FMS in place but scant output at this time.  Spoke with pt's son at bedside who reports pt with terrible eating habits PTA. Pt mostly ate fast food or convenience foods and didn't eat much. Per son, pt basically only drank beer and Monster energy drinks. Per son, pt has lost a significant amount of weight over the last few years.  Reviewed weight history in chart. Pt with a 5.2 kg weight loss since 02/19/22. This is a 7.4% weight loss in less than 1 year which is not clinically significant for timeframe. Based on NFPE, pt meets criteria for severe malnutrition.  Patient is currently intubated on ventilator support MV: 20 L/min Temp (24hrs), Avg:98.7 F (37.1 C), Min:97.7 F (36.5 C), Max:99.6 F (37.6 C)  Drips: Propofol: 35 mcg/kg/min (provides 363 kcal daily from lipid) Amiodarone  Medications reviewed and include: folic acid 1 mg daily, SSI q 4 hours, MVI with minerals, IV protonix, klor-con 40 mEq q 4 hours, thiamine 100 mg daily, IV abx, IV calcium gluconate 2 grams x 1, IV potassium phosphate 15 mmol x 1 followed by IV potassium phosphate 30 mmol x 1  Labs reviewed: potassium <2.0, phosphate 2.2, magnesium 1.3, ionized calcium 1.01, WBC 14.1, INR 2.2 CBG's: 114-158 x 24 hours  UOP: 350 ml x 12 hours I/O's: +139 ml since admit  NUTRITION - FOCUSED PHYSICAL EXAM:  Flowsheet Row Most Recent Value  Orbital Region Severe depletion  Upper Arm Region Severe depletion  Thoracic and Lumbar Region Unable to assess  [cooling blanket in place]  Buccal Region  Unable to assess  Temple Region Severe depletion  Clavicle Bone Region Moderate depletion  Clavicle and Acromion Bone Region Severe depletion  Scapular Bone Region Severe depletion  Dorsal Hand Unable to assess  [mitts]  Patellar Region Severe depletion  Anterior Thigh Region Unable to assess  [cooling blanket in place]  Posterior Calf  Region Severe depletion  Edema (RD Assessment) None  Hair Reviewed  Eyes Reviewed  Mouth Unable to assess  Skin Reviewed  Nails Reviewed       Diet Order:   Diet Order             Diet NPO time specified  Diet effective now                   EDUCATION NEEDS:   Not appropriate for education at this time  Skin:  Skin Assessment: Reviewed RN Assessment  Last BM:  FMS in place  Height:   Ht Readings from Last 1 Encounters:  02/02/23 6' (1.829 m)    Weight:   Wt Readings from Last 1 Encounters:  02/02/23 65.1 kg    Ideal Body Weight:  80.9 kg  BMI:  Body mass index is 19.46 kg/m.  Estimated Nutritional Needs:   Kcal:  1900-2100  Protein:  90-110 grams  Fluid:  >1.8 L    Mertie Clause, MS, RD, LDN Inpatient Clinical Dietitian Please see AMi[facility] for contact information.

## 2023-02-02 NOTE — Progress Notes (Signed)
Panic Istat results given to J. Suzie Portela, PA CCM, panic K+ reported.  No new RT orders at this time.

## 2023-02-02 NOTE — Progress Notes (Signed)
eLink Physician-Brief Progress Note Patient Name: Daniel Arroyo DOB: [birth year] MRN: [id number]   Date of Service  02/02/2023  HPI/Events of Note  Patient transferred from [facility] with GI bleeding, acute blood loss anemia, brief cardiac arrest s/p CPR with ROSC, and acute respiratory failure requiring intubation and mechanical ventilation. Patient has a prior history of CVA.  eICU Interventions  New Patient Evaluation.        Daniel Arroyo [name] 02/02/2023, 1:44 AM

## 2023-02-02 NOTE — Procedures (Signed)
Patient Name: Daniel Arroyo  MRN: [id number]  Epilepsy Attending: Charlsie Quest  Referring Physician/Provider: Gleason, Darcella Gasman, PA-C  Date: 02/02/2023 Duration: 22.34 mins  Patient history: 51yo M s/p V fib arrest. EEG to evaluate for seizure  Level of alertness:  comatose  AEDs during EEG study: Propofol  Technical aspects: This EEG study was done with scalp electrodes positioned according to the 10-20 International system of electrode placement. Electrical activity was reviewed with band pass filter of 1-70Hz , sensitivity of 7 uV/mm, display speed of 35mm/sec with a 60Hz  notched filter applied as appropriate. EEG data were recorded continuously and digitally stored.  Video monitoring was available and reviewed as appropriate.  Description:  EEG showed continuous generalized low amplitude 2-3Hz  delta slowing admixed with overriding 15 to 18 Hz beta activity distributed symmetrically and diffusely. Hyperventilation and photic stimulation were not performed.     ABNORMALITY - Continuous slow, generalized - Excessive beta, generalized  IMPRESSION: This study is suggestive of severe diffuse encephalopathy, nonspecific etiology. No seizures or epileptiform discharges were seen throughout the recording.  Daniel Arroyo

## 2023-02-02 NOTE — Plan of Care (Signed)

## 2023-02-02 NOTE — Consult Note (Signed)
Cardiology Consultation   Patient ID: Saunders Heinzman MRN: [id number]; DOB: Jun 14, 1971  Admit date: 02/02/2023 Date of Consult: 02/02/2023  PCP:  Salley Slaughter, NP   [facility] Providers Cardiologist:  Nona Dell, MD        Patient Profile:   Imir Butz is a 52 y.o. male with a hx of polysubstance abuse, prior CVA, stable type II aortic dissection, prior type I aortic dissection s/p proximal hemiarch replacement and bioprosthetic AVR 08/2020 who is being seen 02/02/2023 for the evaluation of cardiac arrest at the request of Dr. Merrily Pew.  History of Present Illness:   Mr. Calderone is currently intubated and sedated. History reviewed from the chart.   History of type I aortic dissection, s/p proximal hemiarch replacement and bioprosthetic AVR 08/2020 at [facility].  Per notes, he presented yesterday with GI bleeding and anemia. Had significant electrolyte abnormalities as well. K 1.6, calcium 6.5. Hgb 10.2. Had VF arrest at [facility] ER, received CPR, epinephrine, and defibrillation.  Workup included CTA showing diffuse aortic dissection from thoracic aorta to iliacs.   Since arriving to [facility], he remains intubated and sedated. He is being treated with targeted temperature management. Echo shows EF ~40%, difficult images.   Past Medical History:  Diagnosis Date   ADHD, adult residual type    Anxiety and depression    Bipolar disorder (HCC)    Depression    Gastroesophageal reflux disease    History of stroke 2018   Reportedly in the setting of substance abuse   Knee pain    Polysubstance abuse (HCC)    Stroke [facility])     Past Surgical History:  Procedure Laterality Date   AORTA SURGERY N/A    TONSILLECTOMY       Home Medications:  Prior to Admission medications   Medication Sig Start Date End Date Taking? Authorizing Provider  albuterol (VENTOLIN HFA) 108 (90 Base) MCG/ACT inhaler As directed 01/28/20   [provider]   amphetamine-dextroamphetamine (ADDERALL XR) 30 MG 24 hr capsule Take 30 mg by mouth in the morning and at bedtime. 03/08/19   [provider]  cyclobenzaprine (FLEXERIL) 10 MG tablet Take 10 mg by mouth daily as needed. 03/06/20   [provider]  divalproex (DEPAKOTE) 250 MG DR tablet Take 1 tab in AM, 2 tabs in PM 02/19/22   Van Clines, MD  escitalopram (LEXAPRO) 10 MG tablet Take 10 mg by mouth daily. 02/14/20   [provider]  fluticasone (FLONASE) 50 MCG/ACT nasal spray Place 1 spray into both nostrils daily. 12/28/19   [provider]  furosemide (LASIX) 20 MG tablet TAKE 1 TABLET BY MOUTH DAILY 11/29/19   Jonelle Sidle, MD  mirtazapine (REMERON) 15 MG tablet Take 1 tablet (15 mg total) by mouth at bedtime. 02/21/17   Oneta Rack, NP  mirtazapine (REMERON) 30 MG tablet Take 30 mg by mouth at bedtime. 08/09/20   [provider]  omeprazole (PRILOSEC) 20 MG capsule Take 20 mg by mouth daily.    [provider]  tadalafil (CIALIS) 20 MG tablet Take 20 mg by mouth daily. 05/02/20   [provider]  temazepam (RESTORIL) 15 MG capsule Take 1 capsule by mouth at bedtime. 03/05/20   [provider]    Inpatient Medications: Scheduled Meds:  Chlorhexidine Gluconate Cloth  6 each Topical Daily   feeding supplement (PROSource TF20)  60 mL Per Tube Daily   folic acid  1 mg Per Tube Daily  insulin aspart  0-9 Units Subcutaneous Q4H   multivitamin with minerals  1 tablet Per Tube Daily   mouth rinse  15 mL Mouth Rinse Q2H   pantoprazole (PROTONIX) IV  40 mg Intravenous Q12H   potassium chloride  40 mEq Per Tube Q4H   sodium chloride flush  10-40 mL Intracatheter Q12H   thiamine  100 mg Per Tube Daily   Continuous Infusions:  sodium chloride 10 mL/hr at 02/02/23 1800   amiodarone 30 mg/hr (02/02/23 1800)   dexmedetomidine (PRECEDEX) IV infusion 0.5 mcg/kg/hr (02/02/23 1800)   feeding supplement (VITAL 1.5 CAL)      piperacillin-tazobactam (ZOSYN)  IV 3.375 g (02/02/23 1803)   potassium chloride 50 mL/hr at 02/02/23 1800   potassium PHOSPHATE IVPB (in mmol) 85 mL/hr at 02/02/23 1800   PRN Meds: sodium chloride, albuterol, hydrALAZINE, mouth rinse, sodium chloride flush  Allergies:   No Known Allergies  Social History:   Social History   Socioeconomic History   Marital status: Divorced    Spouse name: Not on file   Number of children: Not on file   Years of education: Not on file   Highest education level: Not on file  Occupational History   Not on file  Tobacco Use   Smoking status: Every Day    Packs/day: .5    Types: Cigarettes   Smokeless tobacco: Never  Vaping Use   Vaping Use: Never used  Substance and Sexual Activity   Alcohol use: Yes    Comment: in rehab    Drug use: Not Currently    Types: Heroin, Marijuana    Comment: in rehab   Sexual activity: Not on file  Other Topics Concern   Not on file  Social History Narrative   Right handed      Lives with father   Live in two but going to move into a one story   Caffeine 2 cups daily   Social Determinants of Health   Financial Resource Strain: Not on file  Food Insecurity: Not on file  Transportation Needs: Not on file  Physical Activity: Not on file  Stress: Not on file  Social Connections: Not on file  Intimate Partner Violence: Not on file    Family History:    Family History  Problem Relation Age of Onset   Heart attack Mother        Died in early 27's   HIV Brother        Died in early 30's     ROS:  Unable to obtain as patient is unresponsive.  Physical Exam/Data:   Vitals:   02/02/23 1715 02/02/23 1730 02/02/23 1745 02/02/23 1800  BP: 118/82 115/74 115/74 123/82  Pulse: 75 68 69 78  Resp: (!) 22 19 20  (!) 24  Temp: 99.1 F (37.3 C) 99.5 F (37.5 C) 99.7 F (37.6 C) 99.9 F (37.7 C)  TempSrc:      SpO2: 100% 100% 100% 100%  Weight:      Height:        Intake/Output Summary (Last 24  hours) at 02/02/2023 1813 Last data filed at 02/02/2023 1800 Gross per 24 hour  Intake 1925.63 ml  Output 1200 ml  Net 725.63 ml      02/02/2023    2:00 AM 02/02/2023    1:48 AM 02/19/2022    2:07 PM  Last 3 Weights  Weight (lbs) 143 lb 8.3 oz 144 lb 6.4 oz 155 lb  Weight (kg) 65.1 kg 65.5  kg 70.308 kg     Body mass index is 19.46 kg/m.  GEN: intubated and sedated, on ventilator NECK: No JVD CARDIAC: regular rhythm, normal S1 and S2, no rubs or gallops. No murmur. VASCULAR: Radial pulses 2+ bilaterally.  RESPIRATORY:  Clear to auscultation without rales, wheezing or rhonchi  ABDOMEN: Soft, non-tender, non-distended MUSCULOSKELETAL:  thin appearing SKIN: Warm and dry, no edema NEUROLOGIC/PSYCHIATRIC: intubated and sedated   EKG:  The EKG was personally reviewed and demonstrates:  SR, nonspecific ST pattern Telemetry:  Telemetry was personally reviewed and demonstrates:  SR with frequent PVCs but no sustained VT  Relevant CV Studies: Echo 07/23/22 [facility]) Summary 1. The left ventricle is normal in size with normal wall thickness. 2. The left ventricular systolic function is normal, LVEF is visually estimated at 50-55%. 3. Aortic valve replacement (25 mm bioprosthetic, implantation date: 09/15/2020). 4. The left atrium is mildly dilated in size. 5. The right ventricle is normal in size, with normal systolic function. 6. No evidence of valvular or endocardial vegetation.   CT C/A/P IMPRESSION: Vascular: 1. Sequela of prior ascending aorta and proximal hemiarch repair for type A aortic dissection. Residual dissection extends from the mid aortic arch to the infrarenal abdominal aorta. The dissection flap extends into the proximal innominate, left common carotid and left subclavian arteries. 2. 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 There is slightly increased thrombosis of the false lumen within this aneurysm. 2. 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 3. 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 Nonvascular: 1. Unchanged right lower pole hypoattenuating renal lesion.   Laboratory Data:  High Sensitivity Troponin:   Recent Labs  Lab 02/02/23 0219 02/02/23 0407  TROPONINIHS 477* 434*     Chemistry Recent Labs  Lab 02/02/23 0219 02/02/23 0917 02/02/23 1036 02/02/23 1715  NA 135 138 136 137  K 2.1* <2.0* <2.0* 4.8  CL 103  --  104 106  CO2 21*  --  20* 22  GLUCOSE 189*  --  139* 129*  BUN 10  --  9 9  CREATININE 1.14  --  1.06 0.93  CALCIUM 6.4*  --  7.0* 8.4*  MG 1.3*  --   --  2.4  GFRNONAA >60  --  >60 >60  ANIONGAP 11  --  12 9    No results for input(s): "PROT", "ALBUMIN", "AST", "ALT", "ALKPHOS", "BILITOT" in the last 168 hours. Lipids No results for input(s): "CHOL", "TRIG", "HDL", "LABVLDL", "LDLCALC", "CHOLHDL" in the last 168 hours.  Hematology Recent Labs  Lab 02/02/23 0219 02/02/23 0917  WBC 14.1*  --   RBC 3.95*  --   HGB 10.5* 11.2*  HCT 30.7* 33.0*  MCV 77.7*  --   MCH 26.6  --   MCHC 34.2  --   RDW 18.5*  --   PLT 217  --    Thyroid No results for input(s): "TSH", "FREET4" in the last 168 hours.  BNP Recent Labs  Lab 02/02/23 0219  BNP 1,346.9*    DDimer No results for input(s): "DDIMER" in the last 168 hours.   Radiology/Studies:  ECHOCARDIOGRAM COMPLETE  Result Date: 02/02/2023    ECHOCARDIOGRAM REPORT   Patient Name:   BIRD LAMARQUE Date of Exam: 02/02/2023 Medical Rec #:  [id number]       Height:       72.0 in Accession #:    [id number]  Weight:       143.5 lb Date of Birth:  Jun 06, 1971      BSA:          1.850 m Patient Age:    51 years        BP:           172/82 mmHg Patient Gender: M               HR:           88 bpm. Exam Location:  Inpatient Procedure: 2D Echo, Color Doppler and Cardiac Doppler Indications:    Cardiac Arrest  History:        Patient has no prior history of Echocardiogram  examinations.                 Arrythmias:Cardiac Arrest. ETOH, Cocaine, and Polysubstance                 Abuse.  Sonographer:    Milbert Coulter Referring Phys: [id number] Aliene Beams  Sonographer Comments: No parasternal window, echo performed with patient supine and on artificial respirator and no subcostal window. [employer], contracted left arm. and Image acquisition challenging due to respiratory motion. IMPRESSIONS  1. Left ventricular ejection fraction, by estimation, is 40%. The left ventricle has mildly decreased function. Left ventricular endocardial border not optimally defined to fully evaluate regional wall motion. Septal hypokinesis worse that other global findings.There is mild concentric left ventricular hypertrophy. Left ventricular diastolic parameters are indeterminate.  2. Right ventricular systolic function is normal. The right ventricular size is normal.  3. The mitral valve is abnormal. Mild mitral valve regurgitation. No evidence of mitral stenosis.  4. The aortic valve has been repaired/replaced. Aortic valve regurgitation is not visualized. Aortic valve mean gradient measures 13.0 mmHg.  5. Aortic root/ascending aorta has been repaired/replaced. Comparison(s): No prior Echocardiogram. Technically difficult study. FINDINGS  Left Ventricle: Left ventricular ejection fraction, by estimation, is 40%. The left ventricle has mildly decreased function. Left ventricular endocardial border not optimally defined to evaluate regional wall motion. The left ventricular internal cavity  size was normal in size. There is mild concentric left ventricular hypertrophy. Left ventricular diastolic parameters are indeterminate.  LV Wall Scoring: The anterior septum, mid inferoseptal segment, and basal inferoseptal segment are hypokinetic. Right Ventricle: The right ventricular size is normal. No increase in right ventricular wall thickness. Right ventricular systolic function is normal. Left Atrium: Left atrial  size was normal in size. Right Atrium: Right atrial size was normal in size. Pericardium: There is no evidence of pericardial effusion. Mitral Valve: The mitral valve is abnormal. Mild mitral valve regurgitation. No evidence of mitral valve stenosis. Tricuspid Valve: The tricuspid valve is normal in structure. Tricuspid valve regurgitation is trivial. No evidence of tricuspid stenosis. Aortic Valve: The aortic valve has been repaired/replaced. Aortic valve regurgitation is not visualized. Aortic valve mean gradient measures 13.0 mmHg. Aortic valve peak gradient measures 24.6 mmHg. Aortic valve area, by VTI measures 1.21 cm. Pulmonic Valve: The pulmonic valve was not well visualized. Pulmonic valve regurgitation is not visualized. Aorta: The aortic root/ascending aorta has been repaired/replaced. IAS/Shunts: The interatrial septum was not well visualized.  LEFT VENTRICLE PLAX 2D LVIDd:         4.60 cm      Diastology LVIDs:         4.20 cm      LV e' medial:    7.51 cm/s LV PW:  1.60 cm      LV E/e' medial:  6.9 LV IVS:        1.60 cm      LV e' lateral:   10.40 cm/s LVOT diam:     1.80 cm      LV E/e' lateral: 5.0 LV SV:         55 LV SV Index:   30 LVOT Area:     2.54 cm  LV Volumes (MOD) LV vol d, MOD A2C: 98.9 ml LV vol d, MOD A4C: 101.0 ml LV vol s, MOD A2C: 74.0 ml LV vol s, MOD A4C: 45.9 ml LV SV MOD A2C:     24.9 ml LV SV MOD A4C:     101.0 ml LV SV MOD BP:      42.4 ml RIGHT VENTRICLE RV Basal diam:  3.00 cm RV Mid diam:    2.40 cm RV S prime:     7.18 cm/s TAPSE (M-mode): 1.6 cm LEFT ATRIUM             Index        RIGHT ATRIUM           Index LA diam:        3.20 cm 1.73 cm/m   RA Area:     16.00 cm LA Vol (A2C):   65.5 ml 35.40 ml/m  RA Volume:   37.80 ml  20.43 ml/m LA Vol (A4C):   52.9 ml 28.59 ml/m LA Biplane Vol: 57.8 ml 31.24 ml/m  AORTIC VALVE AV Area (Vmax):    1.29 cm AV Area (Vmean):   1.24 cm AV Area (VTI):     1.21 cm AV Vmax:           248.00 cm/s AV Vmean:          168.000  cm/s AV VTI:            0.454 m AV Peak Grad:      24.6 mmHg AV Mean Grad:      13.0 mmHg LVOT Vmax:         126.00 cm/s LVOT Vmean:        81.800 cm/s LVOT VTI:          0.215 m LVOT/AV VTI ratio: 0.47 MITRAL VALVE               TRICUSPID VALVE MV Area (PHT): 3.60 cm    TR Peak grad:   30.2 mmHg MV Decel Time: 211 msec    TR Vmax:        275.00 cm/s MV E velocity: 51.90 cm/s MV A velocity: 52.90 cm/s  SHUNTS MV E/A ratio:  0.98        Systemic VTI:  0.22 m                            Systemic Diam: 1.80 cm Riley Lam MD Electronically signed by Riley Lam MD Signature Date/Time: 02/02/2023/3:00:04 PM    Final    DG Abd Portable 1V  Result Date: 02/02/2023 CLINICAL DATA:  Encounter for feeding tube placement EXAM: PORTABLE ABDOMEN - 1 VIEW COMPARISON:  None Available. FINDINGS: Enteric tube courses below the diaphragm with the tip likely in the distal stomach near the pylorus. Nonobstructive visualized bowel gas pattern. Suspected bibasilar opacities. IMPRESSION: 1. Enteric tube courses below the diaphragm with the tip likely in the distal stomach near the pylorus. 2. Suspected bibasilar opacities. Dedicated  chest x-ray could further evaluate if clinically warranted. Electronically Signed   By: Feliberto Harts M.D.   On: 02/02/2023 13:46   DG CHEST PORT 1 VIEW  Result Date: 02/02/2023 CLINICAL DATA:  Endotracheal tube EXAM: PORTABLE CHEST 1 VIEW COMPARISON:  CXR 02/01/23, CTA Chest 02/01/23 FINDINGS: Endotracheal tube terminates approximately 5 cm above the carina. Enteric tube is slightly retracted with the tip positioned at the level of the GE junction and side hole above the GE junction. Esophageal temperature probe terminates in the upper esophagus. Status post median sternotomy. Unchanged cardiac and mediastinal contours. No pleural effusion. No pneumothorax. Redemonstrated are bibasilar airspace opacities, right-greater-than-left, which could represent atelectasis or infection. No  radiographically apparent displaced rib fractures. Visualized upper abdomen is unremarkable IMPRESSION: 1. Endotracheal tube terminates approximately 5 cm above the carina. 2. Enteric tube is slightly retracted with the tip positioned at the level of the GE junction and side hole above the GE junction. Recommend advancement. 3. Redemonstrated are bibasilar airspace opacities, right-greater-than-left, which could represent atelectasis or infection. Electronically Signed   By: Lorenza Cambridge M.D.   On: 02/02/2023 10:27   EEG adult  Result Date: 02/02/2023 Charlsie Quest, MD     02/02/2023  8:58 AM Patient Name: Taylen Adcock MRN: [id number] Epilepsy Attending: Charlsie Quest Referring Physician/Provider: Gleason, Darcella Gasman, PA-C Date: 02/02/2023 Duration: 22.34 mins Patient history: 51yo M s/p V fib arrest. EEG to evaluate for seizure Level of alertness:  comatose AEDs during EEG study: Propofol Technical aspects: This EEG study was done with scalp electrodes positioned according to the 10-20 International system of electrode placement. Electrical activity was reviewed with band pass filter of 1-70Hz , sensitivity of 7 uV/mm, display speed of 75mm/sec with a 60Hz  notched filter applied as appropriate. EEG data were recorded continuously and digitally stored.  Video monitoring was available and reviewed as appropriate. Description:  EEG showed continuous generalized low amplitude 2-3Hz  delta slowing admixed with overriding 15 to 18 Hz beta activity distributed symmetrically and diffusely. Hyperventilation and photic stimulation were not performed.   ABNORMALITY - Continuous slow, generalized - Excessive beta, generalized IMPRESSION: This study is suggestive of severe diffuse encephalopathy, nonspecific etiology. No seizures or epileptiform discharges were seen throughout the recording. Priyanka Annabelle Harman   CT HEAD WO CONTRAST ( )  Result Date: 02/02/2023 CLINICAL DATA:  52 year old male with altered mental  status. History of chronic right ICA occlusion, right MCA infarction. EXAM: CT HEAD WITHOUT CONTRAST TECHNIQUE: Contiguous axial images were obtained from the base of the skull through the vertex without intravenous contrast. RADIATION DOSE REDUCTION: This exam was performed according to the departmental dose-optimization program which includes automated exposure control, adjustment of the mA and/or kV according to patient size and/or use of iterative reconstruction technique. COMPARISON:  Report of [facility] Head CT 05/19/2019 (no images available). [facility] Face CT 07/26/2022. FINDINGS: Brain: Series 4 axial images are the most diagnostic, least oblique. Chronic right hemisphere encephalomalacia corresponding to they MCA territory, and stable from visible brain parenchyma last year. Mild ex vacuo enlargement of the lateral ventricles. No intracranial mass effect. Maintained gray-white differentiation elsewhere. Streak artifact and/or laminar necrosis suspected in some areas of the right hemisphere. No convincing acute intracranial hemorrhage identified. No cortically based acute infarct identified. Vascular: No suspicious intracranial vascular hyperdensity. Calcified atherosclerosis at the skull base. 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 No acute osseous abnormality identified. Sinuses/Orbits:  Visualized paranasal sinuses and mastoids are stable and well aerated. Other: Intubated on the scout view. Partially visible oral enteric tube looping in the oropharynx on series 4, image 37. No acute orbit or scalp soft tissue finding is evident. IMPRESSION: 1. No acute intracranial abnormality identified. Chronic Right MCA territory infarct. 2. Intubated, with partially visible enteric tube looping in the oropharynx. Electronically Signed   By: Odessa Fleming M.D.   On: 02/02/2023 04:46     Assessment and  Plan:   Cardiac arrest  Ventricular Fibrillation Severe hypokalemia and hypocalcemia Blood loss anemia requiring transfusion Elevated troponin -chest pain was not main symptom, but with newly reduced EF and VF arrest, can consider cath prior to discharge based on clinical trajectory -suspect electrolyte abnormalities may have driven VT/VF -electrolytes being repleted -continue amiodarone for now  We will follow clinical recovery for timing of cath and consideration of ICD in the future.   Risk Assessment/Risk Scores:        New York Heart Association (NYHA) Functional Class NA, unable to obtain   For questions or updates, please contact [facility] Please consult [email or web address] for contact info under    Signed, Jodelle Red, MD  02/02/2023 6:13 PM

## 2023-02-02 NOTE — Progress Notes (Signed)
Patient received from another facility. Placed on ventilator without any complications. Pt will continue to be monitored.

## 2023-02-02 NOTE — TOC Progression Note (Signed)
Transition of Care [facility]) - Initial/Assessment Note    Patient Details  Name: Daniel Arroyo MRN: [id number] Date of Birth: 1971/05/11  Transition of Care [facility]) CM/SW Contact:    Ralene Bathe, LCSWA Phone Number: 02/02/2023, 5:28 PM  Clinical Narrative:                 Transition of Care Department [facility]) has reviewed patient.  Patient is intubated at this time. We will continue to monitor patient advancement through interdisciplinary progression rounds.     Patient Goals and CMS Choice            Expected Discharge Plan and Services                                              Prior Living Arrangements/Services                       Activities of Daily Living      Permission Sought/Granted                  Emotional Assessment              Admission diagnosis:  GIB (gastrointestinal bleeding) [K92.2] Patient Active Problem List   Diagnosis Date Noted   GIB (gastrointestinal bleeding) 02/02/2023   Cardiac arrest (HCC) 02/02/2023   Acute respiratory failure (HCC) 02/02/2023   Hypokalemia 02/02/2023   Hypomagnesemia 02/02/2023   Hypocalcemia 02/02/2023   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   Spastic hemiplegia affecting nondominant side (HCC) 09/07/2019   Leg swelling 03/12/2019   Alcohol dependence with alcohol-induced mood disorder (HCC)    Major depressive disorder, recurrent (HCC) 07/17/2016   Alcohol-induced mood disorder (HCC) 06/10/2016   Cocaine dependence (HCC) 04/23/2014   Substance induced mood disorder (HCC) 04/23/2014   MDD (major depressive disorder) (HCC) 04/22/2014   Alcohol use disorder, severe, dependence (HCC) 12/14/2013   Major depressive disorder, recurrent severe without psychotic features (HCC) 12/14/2013   Adult ADHD 12/14/2013   Polysubstance abuse (HCC) 12/13/2013   Alcohol withdrawal (HCC) 10/28/2013   PCP:  Salley Slaughter, NP Pharmacy:   [facility] [place] - [address] [place] [address] Phone: [phone] Fax: [phone]     Social Determinants of Health (SDOH) Social History: SDOH Screenings   Alcohol Screen: Medium Risk (07/29/2017)  Depression (PHQ2-9): Low Risk  (08/12/2020)  Tobacco Use: High Risk (02/19/2022)   SDOH Interventions:     Readmission Risk Interventions     No data to display

## 2023-02-02 NOTE — Progress Notes (Signed)
Patient transported to CT and back to 95M without any complications.

## 2023-02-02 NOTE — Procedures (Signed)
Cortrak  Person Inserting Tube:  Dany Harten C, RD Tube Type:  Cortrak - 43 inches Tube Size:  10 Tube Location:  Left nare Secured by: Bridle Technique Used to Measure Tube Placement:  Marking at nare/corner of mouth Cortrak Secured At:  80 cm   Cortrak Tube Team Note:  Consult received to place a Cortrak feeding tube.   X-ray is required, abdominal x-ray has been ordered by the Cortrak team. Please confirm tube placement before using the Cortrak tube.   If the tube becomes dislodged please keep the tube and contact the Cortrak team at [email or web address] for replacement.  If after hours and replacement cannot be delayed, place a NG tube and confirm placement with an abdominal x-ray.    Nikeisha Klutz P., RD, LDN, CNSC See AMiON for contact information    

## 2023-02-02 NOTE — Progress Notes (Signed)
eLink Physician-Brief Progress Note Patient Name: Daniel Arroyo DOB: 02-Feb-1971 MRN: [id number]   Date of Service  02/02/2023  HPI/Events of Note  Patient with sub-optimal sedation / analgesia on the ventilator.  eICU Interventions  Fentanyl PRN iv boluses + Fentanyl gtt ordered.        Thomasene Lot Paulmichael Schreck 02/02/2023, 10:01 PM

## 2023-02-03 ENCOUNTER — Inpatient Hospital Stay (HOSPITAL_COMMUNITY): Payer: PPO

## 2023-02-03 DIAGNOSIS — I639 Cerebral infarction, unspecified: Secondary | ICD-10-CM

## 2023-02-03 DIAGNOSIS — I469 Cardiac arrest, cause unspecified: Secondary | ICD-10-CM | POA: Diagnosis not present

## 2023-02-03 DIAGNOSIS — K922 Gastrointestinal hemorrhage, unspecified: Secondary | ICD-10-CM

## 2023-02-03 DIAGNOSIS — I4901 Ventricular fibrillation: Secondary | ICD-10-CM | POA: Diagnosis not present

## 2023-02-03 DIAGNOSIS — J9601 Acute respiratory failure with hypoxia: Secondary | ICD-10-CM

## 2023-02-03 DIAGNOSIS — I493 Ventricular premature depolarization: Secondary | ICD-10-CM

## 2023-02-03 DIAGNOSIS — G9340 Encephalopathy, unspecified: Secondary | ICD-10-CM

## 2023-02-03 DIAGNOSIS — E43 Unspecified severe protein-calorie malnutrition: Secondary | ICD-10-CM | POA: Insufficient documentation

## 2023-02-03 LAB — COMPREHENSIVE METABOLIC PANEL
ALT: 30 U/L (ref 0–44)
AST: 43 U/L — ABNORMAL HIGH (ref 15–41)
Albumin: 1.9 g/dL — ABNORMAL LOW (ref 3.5–5.0)
Alkaline Phosphatase: 87 U/L (ref 38–126)
Anion gap: 11 (ref 5–15)
BUN: 9 mg/dL (ref 6–20)
CO2: 21 mmol/L — ABNORMAL LOW (ref 22–32)
Calcium: 7.3 mg/dL — ABNORMAL LOW (ref 8.9–10.3)
Chloride: 105 mmol/L (ref 98–111)
Creatinine, Ser: 0.9 mg/dL (ref 0.61–1.24)
GFR, Estimated: >60 mL/min (ref >60)
Glucose, Bld: 118 mg/dL — ABNORMAL HIGH (ref 70–99)
Potassium: 2.8 mmol/L — ABNORMAL LOW (ref 3.5–5.1)
Sodium: 137 mmol/L (ref 135–145)
Total Bilirubin: 0.3 mg/dL (ref 0.3–1.2)
Total Protein: 4.9 g/dL — ABNORMAL LOW (ref 6.5–8.1)

## 2023-02-03 LAB — GLUCOSE, CAPILLARY
Glucose-Capillary: 48 mg/dL — ABNORMAL LOW (ref 70–99)
Glucose-Capillary: 65 mg/dL — ABNORMAL LOW (ref 70–99)
Glucose-Capillary: 161 mg/dL — ABNORMAL HIGH (ref 70–99)
Glucose-Capillary: 100 mg/dL — ABNORMAL HIGH (ref 70–99)
Glucose-Capillary: 111 mg/dL — ABNORMAL HIGH (ref 70–99)
Glucose-Capillary: 67 mg/dL — ABNORMAL LOW (ref 70–99)
Glucose-Capillary: 101 mg/dL — ABNORMAL HIGH (ref 70–99)
Glucose-Capillary: 66 mg/dL — ABNORMAL LOW (ref 70–99)
Glucose-Capillary: 101 mg/dL — ABNORMAL HIGH (ref 70–99)
Glucose-Capillary: 95 mg/dL (ref 70–99)
Glucose-Capillary: 94 mg/dL (ref 70–99)

## 2023-02-03 LAB — CBC
HCT: 30.8 % — ABNORMAL LOW (ref 39.0–52.0)
Hemoglobin: 10.3 g/dL — ABNORMAL LOW (ref 13.0–17.0)
MCH: 26.1 pg (ref 26.0–34.0)
MCHC: 33.4 g/dL (ref 30.0–36.0)
MCV: 78 fL — ABNORMAL LOW (ref 80.0–100.0)
Platelets: 199 10*3/uL (ref 150–400)
RBC: 3.95 MIL/uL — ABNORMAL LOW (ref 4.22–5.81)
RDW: 18.9 % — ABNORMAL HIGH (ref 11.5–15.5)
WBC: 8.6 10*3/uL (ref 4.0–10.5)
nRBC: 0 % (ref 0.0–0.2)

## 2023-02-03 LAB — BASIC METABOLIC PANEL
Anion gap: 8 (ref 5–15)
BUN: 14 mg/dL (ref 6–20)
CO2: 22 mmol/L (ref 22–32)
Calcium: 7.2 mg/dL — ABNORMAL LOW (ref 8.9–10.3)
Chloride: 107 mmol/L (ref 98–111)
Creatinine, Ser: 0.86 mg/dL (ref 0.61–1.24)
GFR, Estimated: >60 mL/min (ref >60)
Glucose, Bld: 108 mg/dL — ABNORMAL HIGH (ref 70–99)
Potassium: 4.2 mmol/L (ref 3.5–5.1)
Sodium: 137 mmol/L (ref 135–145)

## 2023-02-03 LAB — URINE CULTURE

## 2023-02-03 LAB — PHOSPHORUS
Phosphorus: 3 mg/dL (ref 2.5–4.6)
Phosphorus: 1.9 mg/dL — ABNORMAL LOW (ref 2.5–4.6)

## 2023-02-03 LAB — MAGNESIUM
Magnesium: 1.9 mg/dL (ref 1.7–2.4)
Magnesium: 1.8 mg/dL (ref 1.7–2.4)

## 2023-02-03 LAB — TRIGLYCERIDES: Triglycerides: 110 mg/dL (ref <150)

## 2023-02-03 LAB — CULTURE, BLOOD (ROUTINE X 2)

## 2023-02-03 LAB — CALCIUM, IONIZED: Calcium, Ionized, Serum: 4 mg/dL — ABNORMAL LOW (ref 4.5–5.6)

## 2023-02-03 MED ORDER — ACETAMINOPHEN 325 MG PO TABS
650.0000 mg | ORAL_TABLET | ORAL | Status: DC | PRN
  Administered 2023-02-03 (×2): 650 mg
  Filled 2023-02-03 (×2): qty 2

## 2023-02-03 MED ORDER — NAPHAZOLINE-GLYCERIN 0.012-0.25 % OP SOLN
1.0000 [drp] | Freq: Four times a day (QID) | OPHTHALMIC | Status: AC | PRN
  Administered 2023-02-03: 2 [drp] via OPHTHALMIC
  Administered 2023-02-05: 1 [drp] via OPHTHALMIC
  Filled 2023-02-03: qty 15

## 2023-02-03 MED ORDER — POTASSIUM CHLORIDE 10 MEQ/50ML IV SOLN
10.0000 meq | INTRAVENOUS | Status: AC
  Administered 2023-02-03 (×4): 10 meq via INTRAVENOUS
  Filled 2023-02-03 (×4): qty 50

## 2023-02-03 MED ORDER — POTASSIUM CHLORIDE 20 MEQ PO PACK
40.0000 meq | PACK | Freq: Once | ORAL | Status: AC
  Administered 2023-02-03: 40 meq
  Filled 2023-02-03: qty 2

## 2023-02-03 MED ORDER — MAGNESIUM SULFATE 2 GM/50ML IV SOLN
2.0000 g | Freq: Once | INTRAVENOUS | Status: AC
  Administered 2023-02-03: 2 g via INTRAVENOUS
  Filled 2023-02-03: qty 50

## 2023-02-03 MED ORDER — SODIUM PHOSPHATES 45 MMOLE/15ML IV SOLN
15.0000 mmol | Freq: Once | INTRAVENOUS | Status: AC
  Administered 2023-02-03: 15 mmol via INTRAVENOUS
  Filled 2023-02-03: qty 5

## 2023-02-03 MED ORDER — DEXTROSE 50 % IV SOLN
12.5000 g | INTRAVENOUS | Status: AC
  Administered 2023-02-03: 12.5 g via INTRAVENOUS

## 2023-02-03 MED ORDER — ENOXAPARIN SODIUM 40 MG/0.4ML IJ SOSY
40.0000 mg | PREFILLED_SYRINGE | INTRAMUSCULAR | Status: DC
  Administered 2023-02-03 – 2023-02-05 (×3): 40 mg via SUBCUTANEOUS
  Filled 2023-02-03 (×3): qty 0.4

## 2023-02-03 MED ORDER — DEXTROSE 50 % IV SOLN
INTRAVENOUS | Status: AC
  Administered 2023-02-03: 12.5 g via INTRAVENOUS
  Filled 2023-02-03: qty 50

## 2023-02-03 MED ORDER — MIDAZOLAM HCL 2 MG/2ML IJ SOLN
INTRAMUSCULAR | Status: AC
  Filled 2023-02-03: qty 2

## 2023-02-03 MED ORDER — CALCIUM GLUCONATE-NACL 1-0.675 GM/50ML-% IV SOLN
1.0000 g | Freq: Once | INTRAVENOUS | Status: AC
  Administered 2023-02-03: 1000 mg via INTRAVENOUS
  Filled 2023-02-03: qty 50

## 2023-02-03 MED ORDER — VITAL 1.5 CAL PO LIQD
1000.0000 mL | ORAL | Status: DC
  Administered 2023-02-03 – 2023-02-07 (×4): 1000 mL

## 2023-02-03 NOTE — Progress Notes (Signed)
NAME:  Daniel Arroyo, MRN:  [id number], DOB:  30-May-1971, LOS: 1 ADMISSION DATE:  02/02/2023, CONSULTATION DATE:  02/03/23 REFERRING MD:  EDP, CHIEF COMPLAINT:  cardiac arrest   History of Present Illness:  Daniel Arroyo is a 52 y.o. M with PMH significant for polysubstance abuse and CVA with residual RUE deficits, LLE  injury for which never sought treatment for which never sought treatment and subsequent foot contracture and difficulty ambulating, Type II aortic dissection, bipolar disorder and ADHD who initially presented to [facility] [place] with three days of abdominal pain dark stools.   His potassium was markedly low at 1.6, Hgb WNL. He was treated with octreotide, pantoprazole, antibiotics, and given potassium for his hypokalemia. An abdominal CT showed colitis. He also was found to have aortic dissection which was previously known and unchanged from his previous CT. He then experienced a V-fib cardiac arrest and had CPR for 5 minutes with standard ACLS. He was intubated and briefly required pressors.  CXR with RLL infiltrate. The ED provider spoke with TCTS regarding dissection as follows "Given his history of type II aortic dissection a CT was done of his chest, now showing a type I aortic dissection. The case was discussed with cardio thoracic surgery who found in his records that he has had an aortic arch and valve replacement and therefore is not a surgical candidate. He should be treated as a type II / B dissection"  He was transported to [facility] and arrived hemodynamically stable but not responsive with sedation wean.   Per his father who is at the bedside, pt lives alone and he thinks has been smoking marijuana.  He is unsure about other drugs, does not think he has been drinking a significant amount of alcohol.  Pt is minimally able to care for himself, his father was checking on him 1-2x per day until a couple of weeks ago when pt was verbally abusive, so he stopped coming and has not  seen him recently until today   Pertinent  Medical History   has a past medical history of ADHD, adult residual type, Anxiety and depression, Bipolar disorder (HCC), Depression, Gastroesophageal reflux disease, History of stroke (2018), Knee pain, Polysubstance abuse (HCC), and Stroke (HCC).   Significant Hospital Events: Including procedures, antibiotic start and stop dates in addition to other pertinent events   5/15 transferred from [facility] intubated after brief V-fib arrest, not on pressors   Interim History / Subjective:   Echo yesterday lvef 40%; not able to evaluate regional wall motion  On amio K 2.8 this am; repleted mag and K Patient on precedex and fentanyl due to chewing on tube and coughing causing vent dyssynchrony Cooling blanket in place  Objective   Blood pressure 119/78, pulse 63, temperature 98.1 F (36.7 C), resp. rate 14, height 6' 0.01" (1.829 m), weight 68.7 kg, SpO2 100 %.    Vent Mode: PRVC FiO2 (%):  [40 %-55 %] 40 % Set Rate:  [12 bmp] 12 bmp Vt Set:  [550 mL] 550 mL PEEP:  [5 cmH20] 5 cmH20 Pressure Support:  [5 cmH20] 5 cmH20 Plateau Pressure:  [6 cmH20-25 cmH20] 17 cmH20   Intake/Output Summary (Last 24 hours) at 02/03/2023 0713 Last data filed at 02/03/2023 0600 Gross per 24 hour  Intake 2353.81 ml  Output 1210 ml  Net 1143.81 ml    Filed Weights   02/02/23 0148 02/02/23 0200 02/03/23 0500  Weight: 65.5 kg 65.1 kg 68.7 kg   General:  critically  ill cachectic appearing male on mech vent HEENT: MM pink/moist; ETT in place Neuro: sedate w/ precedex/fentanyl; weak cough/gag present; perrl; no response to painful stimuli CV: s1s2, RRR, no m/r/g PULM:  dim clear BS bilaterally; on mech vent PRVC GI: soft, bsx4 active  Extremities: warm/dry, no edema; decreased mucle tone Skin: no rashes or lesions    Resolved Hospital Problem list     Assessment & Plan:   Witnessed in hospital V-fib arrest likely secondary to electrolyte abnormalities   Approximately 5 minute code in the ED Hypokalemia Hypomagnesemia  Hypocalcemia Initially profoundly low K at 1.6 P: -continuous telemetry monitoring -cards consulted; appreciate recs -replete K and mag this am -serial bmp; trend mag -cont amio drip -echo -cont zosyn for possible aspiration pneum, colitis, uti -f/u cultures -continue arctic sun for TTM normothermia protocol in place; prn tylenol  Acute Encephalopathy Hx of CVA: w/ RUE deficits; Baseline L hemiplegia and poor mobility -CT head no acute abnormality; EEG no seizures; UDS, ethanol, ammonia wnl Hx of polysubstance abuse and alcohol use P: -concern for anoxic injury post arrest -limit sedating meds -thiamine, folic acid, mvi -PT/OT when appropriate -consider MRI tomorrow if mental status not improved  Acute Hypoxic Respiratory Failure Likely RLL aspiration PNA P: -SBT during day as tolerated; will hold on extubation given poor mental status -LTVV strategy with tidal volumes of 6-8 cc/kg ideal body weight -Wean PEEP/FiO2 for SpO2 >92% -VAP bundle in place -Daily SAT and SBT -PAD protocol in place -wean sedation for RASS goal 0 to -1 -f/u trach aspirate -cont zosyn  Sepsis Colitis with reported melena  UTI P: -trend cbc  -cont ppi bid -cont zosyn  -f/u cultures -will start on lovenox given stable hgb and no evidence of bleeding  Type 1 Aortic dissection AVR HTN HFrEF: echo 5/15 ef 40% Sees vascular and cardiology and [facility], had ascending arch and proximal hemiarch replacement due to dissection along with bioprosthetic AVR in 08/2020 -scan reviewed by Dr. Leafy Ro, not a candidate for operative intervention P: -BP control, prn hydralazine -hold home lasix  Protein calorie malnutrition POA P: -TF per RD  Bipolar disorder, ADHD P: -hold home adderall, depakote, remeron, restoril and lexapro  Best Practice (right click and "Reselect all SmartList Selections" daily)   Diet/type: tubefeeds DVT  prophylaxis: LMWH GI prophylaxis: PPI Lines: Central line Foley:  Yes, and it is still needed Code Status:  full code Last date of multidisciplinary goals of care discussion [5/15 son [place] updated at bedside. Upset that he was not contacted first when he arrived to [facility]. He states he is the POA and would like to be contacted first for decision making. ]  Labs   CBC: Recent Labs  Lab 02/02/23 0219 02/02/23 0917 02/03/23 0238  WBC 14.1*  --  8.6  HGB 10.5* 11.2* 10.3*  HCT 30.7* 33.0* 30.8*  MCV 77.7*  --  78.0*  PLT 217  --  199     Basic Metabolic Panel: Recent Labs  Lab 02/02/23 0219 02/02/23 0917 02/02/23 1036 02/02/23 1715 02/03/23 0238  NA 135 138 136 137 137  K 2.1* <2.0* <2.0* 4.8 2.8*  CL 103  --  104 106 105  CO2 21*  --  20* 22 21*  GLUCOSE 189*  --  139* 129* 118*  BUN 10  --  9 9 9   CREATININE 1.14  --  1.06 0.93 0.90  CALCIUM 6.4*  --  7.0* 8.4* 7.3*  MG 1.3*  --   --  2.4 1.9  PHOS 2.2*  --   --  4.0 3.0    GFR: Estimated Creatinine Clearance: 92.2 mL/min (by C-G formula based on SCr of 0.9 mg/dL). Recent Labs  Lab 02/02/23 0219 02/02/23 1036 02/02/23 1539 02/03/23 0238  WBC 14.1*  --   --  8.6  LATICACIDVEN  --  2.6* 1.4  --      Liver Function Tests: Recent Labs  Lab 02/03/23 0238  AST 43*  ALT 30  ALKPHOS 87  BILITOT 0.3  PROT 4.9*  ALBUMIN 1.9*   No results for input(s): "LIPASE", "AMYLASE" in the last 168 hours. Recent Labs  Lab 02/02/23 1036  AMMONIA 32    ABG    Component Value Date/Time   PHART 7.478 (H) 02/02/2023 0917   PCO2ART 25.4 (L) 02/02/2023 0917   PO2ART 163 (H) 02/02/2023 0917   HCO3 18.8 (L) 02/02/2023 0917   TCO2 20 (L) 02/02/2023 0917   ACIDBASEDEF 4.0 (H) 02/02/2023 0917   O2SAT 100 02/02/2023 0917     Coagulation Profile: Recent Labs  Lab 02/02/23 0236  INR 2.2*     Cardiac Enzymes: No results for input(s): "CKTOTAL", "CKMB", "CKMBINDEX", "TROPONINI" in the last 168 hours.  HbA1C: Hgb  A1c MFr Bld  Date/Time Value Ref Range Status  02/02/2023 02:19 AM 5.1 4.8 - 5.6 % Final    Comment:    (NOTE) Pre diabetes:          5.7%-6.4%  Diabetes:              >6.4%  Glycemic control for   <7.0% adults with diabetes   12/14/2013 07:44 PM 4.9 <5.7 % Final    Comment:    (NOTE)                                                                       According to the ADA Clinical Practice Recommendations for 2011, when HbA1c is used as a screening test:  >=6.5%   Diagnostic of Diabetes Mellitus           (if abnormal result is confirmed) 5.7-6.4%   Increased risk of developing Diabetes Mellitus References:Diagnosis and Classification of Diabetes Mellitus,Diabetes Care,2011,34(Suppl 1):S62-S69 and Standards of Medical Care in         Diabetes - 2011,Diabetes Care,2011,34 (Suppl 1):S11-S61.    CBG: Recent Labs  Lab 02/02/23 1939 02/02/23 2308 02/03/23 0309 02/03/23 0328 02/03/23 0332  GLUCAP 112* 74 65* 48* 161*     Review of Systems:   Unable to obtain   Past Medical History:  He,  has a past medical history of ADHD, adult residual type, Anxiety and depression, Bipolar disorder (HCC), Depression, Gastroesophageal reflux disease, History of stroke (2018), Knee pain, Polysubstance abuse (HCC), and Stroke (HCC).   Surgical History:   Past Surgical History:  Procedure Laterality Date   AORTA SURGERY N/A    TONSILLECTOMY       Social History:   reports that he has been smoking cigarettes. He has been smoking an average of .5 packs per day. He has never used smokeless tobacco. He reports current alcohol use. He reports that he does not currently use drugs after having used the following drugs: Heroin and Marijuana.   Family  History:  His family history includes HIV in his brother; Heart attack in his mother.   Allergies No Known Allergies   Home Medications  Prior to Admission medications   Medication Sig Start Date End Date Taking? Authorizing Provider   albuterol (VENTOLIN HFA) 108 (90 Base) MCG/ACT inhaler As directed 01/28/20   [provider]  amphetamine-dextroamphetamine (ADDERALL XR) 30 MG 24 hr capsule Take 30 mg by mouth in the morning and at bedtime. 03/08/19   [provider]  cyclobenzaprine (FLEXERIL) 10 MG tablet Take 10 mg by mouth daily as needed. 03/06/20   [provider]  divalproex (DEPAKOTE) 250 MG DR tablet Take 1 tab in AM, 2 tabs in PM 02/19/22   Van Clines, MD  escitalopram (LEXAPRO) 10 MG tablet Take 10 mg by mouth daily. 02/14/20   [provider]  fluticasone (FLONASE) 50 MCG/ACT nasal spray Place 1 spray into both nostrils daily. 12/28/19   [provider]  furosemide (LASIX) 20 MG tablet TAKE 1 TABLET BY MOUTH DAILY 11/29/19   Jonelle Sidle, MD  mirtazapine (REMERON) 15 MG tablet Take 1 tablet (15 mg total) by mouth at bedtime. 02/21/17   Oneta Rack, NP  mirtazapine (REMERON) 30 MG tablet Take 30 mg by mouth at bedtime. 08/09/20   [provider]  omeprazole (PRILOSEC) 20 MG capsule Take 20 mg by mouth daily.    [provider]  tadalafil (CIALIS) 20 MG tablet Take 20 mg by mouth daily. 05/02/20   [provider]  temazepam (RESTORIL) 15 MG capsule Take 1 capsule by mouth at bedtime. 03/05/20   [provider]     Critical care time:  35 minutes     JD Daryel [facility] 02/03/2023, 7:13 AM  Please see [email or web address] for pager details.  From 7A-7P if no response, please call [phone]. After hours, please call ELink [phone].

## 2023-02-03 NOTE — Progress Notes (Signed)
eLink Physician-Brief Progress Note Patient Name: Monta Barentine DOB: 1970-09-29 MRN: [id number]   Date of Service  02/03/2023  HPI/Events of Note  Femoral line needed for multiple critical infusions over night.  eICU Interventions  Femoral line discontinuation deferred until 9 AM  02/03/23.        Thomasene Lot Tanishia Lemaster 02/03/2023, 12:35 AM

## 2023-02-03 NOTE — Progress Notes (Signed)
Pt transported to MRI and back without complications. 

## 2023-02-03 NOTE — Progress Notes (Signed)
[facility] ADULT ICU REPLACEMENT PROTOCOL   The patient does apply for the [facility] Adult ICU Electrolyte Replacment Protocol based on the criteria listed below:   1.Exclusion criteria: TCTS, ECMO, Dialysis, and Myasthenia Gravis patients 2. Is GFR >/= 30 ml/min? Yes.    Patient's GFR today is >60 3. Is SCr </= 2? Yes.   Patient's SCr is 0.90 mg/dL 4. Did SCr increase >/= 0.5 in 24 hours? No. 5.Pt's weight >40kg  Yes.   6. Abnormal electrolyte(s): potassium 2.8, mag 1.9  7. Electrolytes replaced per protocol 8.  Call MD STAT for K+ </= 2.5, Phos </= 1, or Mag </= 1 Physician:  protocol   Melvern Banker 02/03/2023 3:35 AM

## 2023-02-03 NOTE — Progress Notes (Addendum)
eLink Physician-Brief Progress Note Patient Name: Daniel Arroyo DOB: Dec 06, 1970 MRN: [id number]   Date of Service  02/03/2023  HPI/Events of Note  MRI brain result reviewed. Patient has right eye conjunctivitis.  eICU Interventions  Stat neurology consultation. Visine drops ordered. Daytime PCCM attending physician to examine eye in AM.        [name] 02/03/2023, 8:59 PM

## 2023-02-03 NOTE — Consult Note (Signed)
NEURO HOSPITALIST CONSULT NOTE   Requesting physician: Dr. Warrick Parisian  Reason for Consult: Multifocal acute strokes seen on MRI  History obtained from:  Chart     HPI:                                                                                                                                          [name] is an 52 y.o. 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 While in the ED at [facility], he had a brief episode of V-fib arrest with ROSC achieved after 5 minutes of CPR.   MRI was then obtained, revealing scattered acute infarcts in the right occipital lobe, left inferior cerebellum, bilateral caudate heads, and anterior right frontal lobe. A large region of chronic encephalomalacia in the right middle cerebral artery territory was also noted.   EEG revealed findings most consistent with a severe diffuse encephalopathy. No epileptiform discharges or electrographic seizures were seen.   At baseline the patient is minimally able to care for himself. His father had been checking on him once to twice per day until a couple of weeks PTA when at that time the patient was verbally abusive, so father stopped coming and had not seen him recently until the day of his admission to [facility].     Past Medical History:  Diagnosis Date   ADHD, adult residual type    Anxiety and depression    Bipolar disorder (HCC)    Depression    Gastroesophageal reflux disease    History of stroke 2018   Reportedly in the setting of substance abuse   Knee pain    Polysubstance abuse (HCC)    Stroke [facility])     Past Surgical History:  Procedure  Laterality Date   AORTA SURGERY N/A    TONSILLECTOMY      Family History  Problem Relation Age of Onset   Heart attack Mother        Died in early 10's   HIV Brother        Died in early 76's              Social History:  reports that he has been smoking cigarettes. He has been smoking an average of .5 packs per day. He has never used smokeless tobacco. He reports current alcohol use. He reports that he does not currently use drugs after having used the following drugs: Heroin and Marijuana.  No Known Allergies  MEDICATIONS:  Prior to Admission:  Medications Prior to Admission  Medication Sig Dispense Refill Last Dose   albuterol (VENTOLIN HFA) 108 (90 Base) MCG/ACT inhaler As directed      amphetamine-dextroamphetamine (ADDERALL XR) 30 MG 24 hr capsule Take 30 mg by mouth in the morning and at bedtime.      cyclobenzaprine (FLEXERIL) 10 MG tablet Take 10 mg by mouth daily as needed.      divalproex (DEPAKOTE) 250 MG DR tablet Take 1 tab in AM, 2 tabs in PM 90 tablet 11    escitalopram (LEXAPRO) 10 MG tablet Take 10 mg by mouth daily.      fluticasone (FLONASE) 50 MCG/ACT nasal spray Place 1 spray into both nostrils daily.      furosemide (LASIX) 20 MG tablet TAKE 1 TABLET BY MOUTH DAILY 90 tablet 0    mirtazapine (REMERON) 15 MG tablet Take 1 tablet (15 mg total) by mouth at bedtime. 30 tablet 0    mirtazapine (REMERON) 30 MG tablet Take 30 mg by mouth at bedtime.      omeprazole (PRILOSEC) 20 MG capsule Take 20 mg by mouth daily.      tadalafil (CIALIS) 20 MG tablet Take 20 mg by mouth daily.      temazepam (RESTORIL) 15 MG capsule Take 1 capsule by mouth at bedtime.      Scheduled:  Chlorhexidine Gluconate Cloth  6 each Topical Daily   docusate  100 mg Per Tube BID   enoxaparin (LOVENOX) injection  40 mg Subcutaneous Q24H   feeding supplement (PROSource TF20)  60  mL Per Tube Daily   folic acid  1 mg Per Tube Daily   insulin aspart  0-9 Units Subcutaneous Q4H   multivitamin with minerals  1 tablet Per Tube Daily   mouth rinse  15 mL Mouth Rinse Q2H   pantoprazole (PROTONIX) IV  40 mg Intravenous Q12H   polyethylene glycol  17 g Per Tube Daily   sodium chloride flush  10-40 mL Intracatheter Q12H   thiamine  100 mg Per Tube Daily   Continuous:  sodium chloride Stopped (02/02/23 1803)   amiodarone 30 mg/hr (02/03/23 2108)   dexmedetomidine (PRECEDEX) IV infusion Stopped (02/03/23 0947)   feeding supplement (VITAL 1.5 CAL) 40 mL/hr at 02/04/23 0000   fentaNYL infusion INTRAVENOUS Stopped (02/03/23 0744)   piperacillin-tazobactam (ZOSYN)  IV 3.375 g (02/04/23 0010)     ROS:                                                                                                                                       Unable to obtain due to obtundation.    Blood pressure 131/84, pulse 74, temperature 98.7 F (37.1 C), temperature source Axillary, resp. rate 13, height 6' 0.01" (1.829 m), weight 68.7 kg, SpO2 100 %.   General Examination:  Physical Exam  General: Cachectic, intubated, NAD.   HEENT-  No external signs of acute trauma. Increased tone of nuchal musculature. Lungs- Intubated Extremities- Dressings at several locations for pressure ulcer prevention.    Neurological Examination Mental Status: Eyes are open but there are no cortically mediated responses spontaneously or to any external stimuli. Does not make eye contact or fixate on any visual stimuli. Not following any commands. No attempts to communicate. Note: Patient sedated due to 100 mcg fentanyl administered just prior to arrival of Neurology.  Cranial Nerves: II: No blink to threat on left or right. PERRL.   III,IV, VI: Eyes are open spontaneously. Eyes are conjugate at the midline  without spontaneous movement. Weak doll's eye reflex is noted.  V,VII: Brisk blink to eyelid stimulation bilaterally.  VIII: No responses to auditory stimuli.  IX,X: Intubated XI: Head is tilted laterally to the right.  XII: Unable to assess Motor/Sensory: Decreased muscle bulk diffusely x 4 LUE: Severe flexion contractures of digits, wrist and elbow with adduction and internal rotation at the shoulder. No movement to pinch or command.  RUE: Mildly increased flexor tone. No movement to pinch or command.  LLE: Weak flexion at knee and ankle to noxious plantar stimulation RLE: Weak flexion at knee and ankle to noxious plantar stimulation Increased extensor tone to BLE; BLE also internally rotated and adducted with knees chronically in contact due to increased tone to these muscle groups.  Deep Tendon Reflexes: Brisk, low amplitude reflexes to right brachioradialis and biceps. No elicitable LUE reflexes due to flexion contractures. Brisk low amplitude patellar reflexes. No achilles reflexes.  Plantars: Mute bilaterally  Cerebellar/Gait: Unable to assess    Lab Results: Basic Metabolic Panel: Recent Labs  Lab 02/02/23 0219 02/02/23 0917 02/02/23 1036 02/02/23 1715 02/03/23 0238 02/03/23 1337 02/03/23 1919  NA 135 138 136 137 137 137  --   K 2.1* <2.0* <2.0* 4.8 2.8* 4.2  --   CL 103  --  104 106 105 107  --   CO2 21*  --  20* 22 21* 22  --   GLUCOSE 189*  --  139* 129* 118* 108*  --   BUN 10  --  9 9 9 14   --   CREATININE 1.14  --  1.06 0.93 0.90 0.86  --   CALCIUM 6.4*  --  7.0* 8.4* 7.3* 7.2*  --   MG 1.3*  --   --  2.4 1.9  --  1.8  PHOS 2.2*  --   --  4.0 3.0  --  1.9*    CBC: Recent Labs  Lab 02/02/23 0219 02/02/23 0917 02/03/23 0238  WBC 14.1*  --  8.6  HGB 10.5* 11.2* 10.3*  HCT 30.7* 33.0* 30.8*  MCV 77.7*  --  78.0*  PLT 217  --  199    Cardiac Enzymes: No results for input(s): "CKTOTAL", "CKMB", "CKMBINDEX", "TROPONINI" in the last 168 hours.  Lipid  Panel: Recent Labs  Lab 02/03/23 0238  TRIG 110    Imaging: MR BRAIN WO CONTRAST  Result Date: 02/03/2023 CLINICAL DATA:  Altered mental status, nontraumatic EXAM: MRI HEAD WITHOUT CONTRAST TECHNIQUE: Multiplanar, multiecho pulse sequences of the brain and surrounding structures were obtained without intravenous contrast. COMPARISON:  No prior MRI available, correlation is made with CT head 02/02/2023 FINDINGS: Brain: Restricted diffusion with ADC correlate most focally right occipital lobe (series 5, images 72-78), as well as in the left inferior left cerebellum (series 5, images 60 and  61), consistent with acute infarcts. Additional foci of restricted diffusion with definite ADC correlates are seen in the bilateral caudate heads (series 5, image 77) and anterior right frontal lobe (series 5, image 96). Foci in the right frontal lobe adjacent to the remote infarct (series 5, images 90 and 91) may be related to susceptibility. The area in the right occipital lobe is associated with increased T2 hyperintense signal. No acute hemorrhage, mass, or mass effect. No extra-axial collection. Redemonstrated extensive encephalomalacia in the right MCA territory, with ex vacuo dilatation of the bilateral lateral ventricles. Approximately 7 mm of left-to-right midline shift, which appears similar to the 07/26/2022 CT head. Hemosiderin deposition is associated with the right MCA territory infarct, consistent with remote petechial hemorrhage. Vascular: Normal arterial flow voids. Skull and upper cervical spine: Normal marrow signal. Sinuses/Orbits: Clear paranasal sinuses. No acute finding in the orbits. Other: Trace fluid in right mastoid air cells. IMPRESSION: Acute infarcts in the right occipital lobe, left inferior cerebellum, bilateral caudate heads, and anterior right frontal lobe. No evidence of acute hemorrhage. These results will be called to the ordering clinician or representative by the Radiologist  Assistant, and communication documented in the PACS or [employer]. Electronically Signed   By: Wiliam Ke M.D.   On: 02/03/2023 20:04   ECHOCARDIOGRAM COMPLETE  Result Date: 02/02/2023    ECHOCARDIOGRAM REPORT   Patient Name:   Daniel Arroyo Resurreccion Date of Exam: 02/02/2023 Medical Rec #:  [id number]       Height:       72.0 in Accession #:    [id number]      Weight:       143.5 lb Date of Birth:  1970/10/13      BSA:          1.850 m Patient Age:    51 years        BP:           172/82 mmHg Patient Gender: M               HR:           88 bpm. Exam Location:  Inpatient Procedure: 2D Echo, Color Doppler and Cardiac Doppler Indications:    Cardiac Arrest  History:        Patient has no prior history of Echocardiogram examinations.                 Arrythmias:Cardiac Arrest. ETOH, Cocaine, and Polysubstance                 Abuse.  Sonographer:    Milbert Coulter Referring Phys: [id number] Aliene Beams  Sonographer Comments: No parasternal window, echo performed with patient supine and on artificial respirator and no subcostal window. [employer], contracted left arm. and Image acquisition challenging due to respiratory motion. IMPRESSIONS  1. Left ventricular ejection fraction, by estimation, is 40%. The left ventricle has mildly decreased function. Left ventricular endocardial border not optimally defined to fully evaluate regional wall motion. Septal hypokinesis worse that other global findings.There is mild concentric left ventricular hypertrophy. Left ventricular diastolic parameters are indeterminate.  2. Right ventricular systolic function is normal. The right ventricular size is normal.  3. The mitral valve is abnormal. Mild mitral valve regurgitation. No evidence of mitral stenosis.  4. The aortic valve has been repaired/replaced. Aortic valve regurgitation is not visualized. Aortic valve mean gradient measures 13.0 mmHg.  5. Aortic root/ascending aorta has been repaired/replaced. Comparison(s): No prior  Echocardiogram. Technically difficult  study. FINDINGS  Left Ventricle: Left ventricular ejection fraction, by estimation, is 40%. The left ventricle has mildly decreased function. Left ventricular endocardial border not optimally defined to evaluate regional wall motion. The left ventricular internal cavity  size was normal in size. There is mild concentric left ventricular hypertrophy. Left ventricular diastolic parameters are indeterminate.  LV Wall Scoring: The anterior septum, mid inferoseptal segment, and basal inferoseptal segment are hypokinetic. Right Ventricle: The right ventricular size is normal. No increase in right ventricular wall thickness. Right ventricular systolic function is normal. Left Atrium: Left atrial size was normal in size. Right Atrium: Right atrial size was normal in size. Pericardium: There is no evidence of pericardial effusion. Mitral Valve: The mitral valve is abnormal. Mild mitral valve regurgitation. No evidence of mitral valve stenosis. Tricuspid Valve: The tricuspid valve is normal in structure. Tricuspid valve regurgitation is trivial. No evidence of tricuspid stenosis. Aortic Valve: The aortic valve has been repaired/replaced. Aortic valve regurgitation is not visualized. Aortic valve mean gradient measures 13.0 mmHg. Aortic valve peak gradient measures 24.6 mmHg. Aortic valve area, by VTI measures 1.21 cm. Pulmonic Valve: The pulmonic valve was not well visualized. Pulmonic valve regurgitation is not visualized. Aorta: The aortic root/ascending aorta has been repaired/replaced. IAS/Shunts: The interatrial septum was not well visualized.  LEFT VENTRICLE PLAX 2D LVIDd:         4.60 cm      Diastology LVIDs:         4.20 cm      LV e' medial:    7.51 cm/s LV PW:         1.60 cm      LV E/e' medial:  6.9 LV IVS:        1.60 cm      LV e' lateral:   10.40 cm/s LVOT diam:     1.80 cm      LV E/e' lateral: 5.0 LV SV:         55 LV SV Index:   30 LVOT Area:     2.54 cm  LV Volumes  (MOD) LV vol d, MOD A2C: 98.9 ml LV vol d, MOD A4C: 101.0 ml LV vol s, MOD A2C: 74.0 ml LV vol s, MOD A4C: 45.9 ml LV SV MOD A2C:     24.9 ml LV SV MOD A4C:     101.0 ml LV SV MOD BP:      42.4 ml RIGHT VENTRICLE RV Basal diam:  3.00 cm RV Mid diam:    2.40 cm RV S prime:     7.18 cm/s TAPSE (M-mode): 1.6 cm LEFT ATRIUM             Index        RIGHT ATRIUM           Index LA diam:        3.20 cm 1.73 cm/m   RA Area:     16.00 cm LA Vol (A2C):   65.5 ml 35.40 ml/m  RA Volume:   37.80 ml  20.43 ml/m LA Vol (A4C):   52.9 ml 28.59 ml/m LA Biplane Vol: 57.8 ml 31.24 ml/m  AORTIC VALVE AV Area (Vmax):    1.29 cm AV Area (Vmean):   1.24 cm AV Area (VTI):     1.21 cm AV Vmax:           248.00 cm/s AV Vmean:          168.000 cm/s AV VTI:  0.454 m AV Peak Grad:      24.6 mmHg AV Mean Grad:      13.0 mmHg LVOT Vmax:         126.00 cm/s LVOT Vmean:        81.800 cm/s LVOT VTI:          0.215 m LVOT/AV VTI ratio: 0.47 MITRAL VALVE               TRICUSPID VALVE MV Area (PHT): 3.60 cm    TR Peak grad:   30.2 mmHg MV Decel Time: 211 msec    TR Vmax:        275.00 cm/s MV E velocity: 51.90 cm/s MV A velocity: 52.90 cm/s  SHUNTS MV E/A ratio:  0.98        Systemic VTI:  0.22 m                            Systemic Diam: 1.80 cm Riley Lam MD Electronically signed by Riley Lam MD Signature Date/Time: 02/02/2023/3:00:04 PM    Final    DG Abd Portable 1V  Result Date: 02/02/2023 CLINICAL DATA:  Encounter for feeding tube placement EXAM: PORTABLE ABDOMEN - 1 VIEW COMPARISON:  None Available. FINDINGS: Enteric tube courses below the diaphragm with the tip likely in the distal stomach near the pylorus. Nonobstructive visualized bowel gas pattern. Suspected bibasilar opacities. IMPRESSION: 1. Enteric tube courses below the diaphragm with the tip likely in the distal stomach near the pylorus. 2. Suspected bibasilar opacities. Dedicated chest x-ray could further evaluate if clinically warranted.  Electronically Signed   By: Feliberto Harts M.D.   On: 02/02/2023 13:46   DG CHEST PORT 1 VIEW  Result Date: 02/02/2023 CLINICAL DATA:  Endotracheal tube EXAM: PORTABLE CHEST 1 VIEW COMPARISON:  CXR 02/01/23, CTA Chest 02/01/23 FINDINGS: Endotracheal tube terminates approximately 5 cm above the carina. Enteric tube is slightly retracted with the tip positioned at the level of the GE junction and side hole above the GE junction. Esophageal temperature probe terminates in the upper esophagus. Status post median sternotomy. Unchanged cardiac and mediastinal contours. No pleural effusion. No pneumothorax. Redemonstrated are bibasilar airspace opacities, right-greater-than-left, which could represent atelectasis or infection. No radiographically apparent displaced rib fractures. Visualized upper abdomen is unremarkable IMPRESSION: 1. Endotracheal tube terminates approximately 5 cm above the carina. 2. Enteric tube is slightly retracted with the tip positioned at the level of the GE junction and side hole above the GE junction. Recommend advancement. 3. Redemonstrated are bibasilar airspace opacities, right-greater-than-left, which could represent atelectasis or infection. Electronically Signed   By: Lorenza Cambridge M.D.   On: 02/02/2023 10:27   EEG adult  Result Date: 02/02/2023 Charlsie Quest, MD     02/02/2023  8:58 AM Patient Name: Keaka Posadas MRN: [id number] Epilepsy Attending: Charlsie Quest Referring Physician/Provider: Gleason, Darcella Gasman, PA-C Date: 02/02/2023 Duration: 22.34 mins Patient history: 51yo M s/p V fib arrest. EEG to evaluate for seizure Level of alertness:  comatose AEDs during EEG study: Propofol Technical aspects: This EEG study was done with scalp electrodes positioned according to the 10-20 International system of electrode placement. Electrical activity was reviewed with band pass filter of 1-70Hz , sensitivity of 7 uV/mm, display speed of 81mm/sec with a 60Hz  notched filter applied as  appropriate. EEG data were recorded continuously and digitally stored.  Video monitoring was available and reviewed as appropriate. Description:  EEG showed continuous generalized low amplitude  2-3Hz  delta slowing admixed with overriding 15 to 18 Hz beta activity distributed symmetrically and diffusely. Hyperventilation and photic stimulation were not performed.   ABNORMALITY - Continuous slow, generalized - Excessive beta, generalized IMPRESSION: This study is suggestive of severe diffuse encephalopathy, nonspecific etiology. No seizures or epileptiform discharges were seen throughout the recording. Daniel Arroyo   CT HEAD WO CONTRAST ( )  Result Date: 02/02/2023 CLINICAL DATA:  52 year old male with altered mental status. History of chronic right ICA occlusion, right MCA infarction. EXAM: CT HEAD WITHOUT CONTRAST TECHNIQUE: Contiguous axial images were obtained from the base of the skull through the vertex without intravenous contrast. RADIATION DOSE REDUCTION: This exam was performed according to the departmental dose-optimization program which includes automated exposure control, adjustment of the mA and/or kV according to patient size and/or use of iterative reconstruction technique. COMPARISON:  Report of [facility] Head CT 05/19/2019 (no images available). [facility] Face CT 07/26/2022. FINDINGS: Brain: Series 4 axial images are the most diagnostic, least oblique. Chronic right hemisphere encephalomalacia corresponding to they MCA territory, and stable from visible brain parenchyma last year. Mild ex vacuo enlargement of the lateral ventricles. No intracranial mass effect. Maintained gray-white differentiation elsewhere. Streak artifact and/or laminar necrosis suspected in some areas of the right hemisphere. No convincing acute intracranial hemorrhage identified. No cortically based acute infarct identified. Vascular: No suspicious intracranial vascular hyperdensity.  Calcified atherosclerosis at the skull base. 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 No acute osseous abnormality identified. Sinuses/Orbits: Visualized paranasal sinuses and mastoids are stable and well aerated. Other: Intubated on the scout view. Partially visible oral enteric tube looping in the oropharynx on series 4, image 37. No acute orbit or scalp soft tissue finding is evident. IMPRESSION: 1. No acute intracranial abnormality identified. Chronic Right MCA territory infarct. 2. Intubated, with partially visible enteric tube looping in the oropharynx. Electronically Signed   By: Odessa Fleming M.D.   On: 02/02/2023 04:46     Assessment: 52 year old male with chronic left hemiplegia secondary to prior large right MCA ischemic infarction who presented to [facility] in transfer from OSH for management of GIB and severe electrolyte disturbance. While in the [facility] ED he sustained brief V-fib arrest with ROSC achieved after 5 minutes of CPR. - Exam reveals an obtunded and cachectic patient with chronically contracted LUE, weak withdrawal of BLE to noxious and no movement of RUE to noxious stimuli.  - MRI brain: Scattered acute infarcts in the right occipital lobe, left inferior cerebellum, bilateral caudate heads, and anterior right frontal lobe. All of the acute infarcts are punctate, except for the occipital lobe infarct which is small and cortically based. A large region of chronic encephalomalacia in the right middle cerebral artery territory is also noted.  - EEG reveals findings most consistent with a severe diffuse encephalopathy. No epileptiform discharges or electrographic seizures are seen.  -  TTE: Left ventricular ejection fraction, by estimation, is 40%. The left ventricle has mildly decreased function. Left ventricular endocardial border not optimally defined to fully evaluate regional wall motion. Septal hypokinesis  worse that other global findings.There is mild concentric left ventricular hypertrophy. Left ventricular diastolic parameters are indeterminate. Right ventricular systolic function is normal. The right ventricular size is normal.  The mitral valve is abnormal. Mild mitral valve regurgitation. No evidence of mitral stenosis. The aortic valve has been repaired/replaced. Aortic valve regurgitation is not visualized. Aortic valve  mean gradient measures 13.0 mmHg.  Aortic root/ascending aorta has been repaired/replaced.  - Etiology for the multifocal strokes is felt most likely to be cardioembolic given involvement of separate vascular territories. Strokes may have occurred during CPR during his brief episode of V-fib arrest.   Recommendations: - CTA of head and neck when stable - Cardiac telemetry - Due to GIB cannot start antiplatelet medication or anticoagulation.  - BP management per CCM - Electrolyte management per CCM - Minimize sedation - PT/OT - Speech therapy consult after extubation - Stroke Team to follow  45 minutes spent in the neurological evaluation and management of this critically ill patient.   Electronically signed: Dr. Caryl Pina 02/03/2023, 9:15 PM

## 2023-02-03 NOTE — Progress Notes (Signed)
Rounding Note    Patient Name: Daniel Arroyo Date of Encounter: 02/03/2023  [facility] Cardiologist: Nona Dell, MD   Subjective   Remains intubated and sedated.  Inpatient Medications    Scheduled Meds:  Chlorhexidine Gluconate Cloth  6 each Topical Daily   docusate  100 mg Per Tube BID   enoxaparin (LOVENOX) injection  40 mg Subcutaneous Q24H   feeding supplement (PROSource TF20)  60 mL Per Tube Daily   folic acid  1 mg Per Tube Daily   insulin aspart  0-9 Units Subcutaneous Q4H   multivitamin with minerals  1 tablet Per Tube Daily   mouth rinse  15 mL Mouth Rinse Q2H   pantoprazole (PROTONIX) IV  40 mg Intravenous Q12H   polyethylene glycol  17 g Per Tube Daily   sodium chloride flush  10-40 mL Intracatheter Q12H   thiamine  100 mg Per Tube Daily   Continuous Infusions:  sodium chloride Stopped (02/02/23 1803)   amiodarone 30 mg/hr (02/03/23 1000)   dexmedetomidine (PRECEDEX) IV infusion Stopped (02/03/23 0947)   feeding supplement (VITAL 1.5 CAL) 30 mL/hr at 02/03/23 1000   fentaNYL infusion INTRAVENOUS Stopped (02/03/23 0744)   piperacillin-tazobactam (ZOSYN)  IV 12.5 mL/hr at 02/03/23 0900   PRN Meds: sodium chloride, acetaminophen, albuterol, fentaNYL (SUBLIMAZE) injection, fentaNYL (SUBLIMAZE) injection, hydrALAZINE, mouth rinse, sodium chloride flush   Vital Signs    Vitals:   02/03/23 0900 02/03/23 1000 02/03/23 1100 02/03/23 1138  BP: 124/84 101/65 114/76   Pulse: 66 66 75   Resp: 17 16 15    Temp: 98.2 F (36.8 C) 97.9 F (36.6 C) 98.2 F (36.8 C) 98.4 F (36.9 C)  TempSrc:      SpO2: 100% 100% 100%   Weight:      Height:        Intake/Output Summary (Last 24 hours) at 02/03/2023 1252 Last data filed at 02/03/2023 1000 Gross per 24 hour  Intake 2418.16 ml  Output 840 ml  Net 1578.16 ml      02/03/2023    5:00 AM 02/02/2023    2:00 AM 02/02/2023    1:48 AM  Last 3 Weights  Weight (lbs) 151 lb 7.3 oz 143 lb 8.3 oz 144 lb  6.4 oz  Weight (kg) 68.7 kg 65.1 kg 65.5 kg      Telemetry    SR with frequent PVCs - Personally Reviewed  ECG    SR with nonspecific st pattern - Personally Reviewed  Physical Exam   GEN: intubated and sedated, on ventilator NECK: No JVD CARDIAC: regular rhythm, normal S1 and S2, no rubs or gallops. No murmur. VASCULAR: Radial pulses 2+ bilaterally.  RESPIRATORY:  Clear to auscultation without rales, wheezing or rhonchi  ABDOMEN: Soft, non-tender, non-distended MUSCULOSKELETAL:  normal bulk SKIN: Warm and dry, no edema NEUROLOGIC/PSYCHIATRIC: intubated and sedated   Labs    High Sensitivity Troponin:   Recent Labs  Lab 02/02/23 0219 02/02/23 0407  TROPONINIHS 477* 434*     Chemistry Recent Labs  Lab 02/02/23 0219 02/02/23 0917 02/02/23 1036 02/02/23 1715 02/03/23 0238  NA 135   < > 136 137 137  K 2.1*   < > <2.0* 4.8 2.8*  CL 103  --  104 106 105  CO2 21*  --  20* 22 21*  GLUCOSE 189*  --  139* 129* 118*  BUN 10  --  9 9 9   CREATININE 1.14  --  1.06 0.93 0.90  CALCIUM 6.4*  --  7.0* 8.4* 7.3*  MG 1.3*  --   --  2.4 1.9  PROT  --   --   --   --  4.9*  ALBUMIN  --   --   --   --  1.9*  AST  --   --   --   --  43*  ALT  --   --   --   --  30  ALKPHOS  --   --   --   --  87  BILITOT  --   --   --   --  0.3  GFRNONAA >60  --  >60 >60 >60  ANIONGAP 11  --  12 9 11    < > = values in this interval not displayed.    Lipids  Recent Labs  Lab 02/03/23 0238  TRIG 110    Hematology Recent Labs  Lab 02/02/23 0219 02/02/23 0917 02/03/23 0238  WBC 14.1*  --  8.6  RBC 3.95*  --  3.95*  HGB 10.5* 11.2* 10.3*  HCT 30.7* 33.0* 30.8*  MCV 77.7*  --  78.0*  MCH 26.6  --  26.1  MCHC 34.2  --  33.4  RDW 18.5*  --  18.9*  PLT 217  --  199   Thyroid No results for input(s): "TSH", "FREET4" in the last 168 hours.  BNP Recent Labs  Lab 02/02/23 0219  BNP 1,346.9*    DDimer No results for input(s): "DDIMER" in the last 168 hours.   Radiology     ECHOCARDIOGRAM COMPLETE  Result Date: 02/02/2023    ECHOCARDIOGRAM REPORT   Patient Name:   Daniel Arroyo Date of Exam: 02/02/2023 Medical Rec #:  [id number]       Height:       72.0 in Accession #:    [id number]      Weight:       143.5 lb Date of Birth:  [birth year]      BSA:          1.850 m Patient Age:    51 years        BP:           172/82 mmHg Patient Gender: M               HR:           88 bpm. Exam Location:  Inpatient Procedure: 2D Echo, Color Doppler and Cardiac Doppler Indications:    Cardiac Arrest  History:        Patient has no prior history of Echocardiogram examinations.                 Arrythmias:Cardiac Arrest. ETOH, Cocaine, and Polysubstance                 Abuse.  Sonographer:    Milbert Coulter Referring Phys: [id number] Aliene Beams  Sonographer Comments: No parasternal window, echo performed with patient supine and on artificial respirator and no subcostal window. [employer], contracted left arm. and Image acquisition challenging due to respiratory motion. IMPRESSIONS  1. Left ventricular ejection fraction, by estimation, is 40%. The left ventricle has mildly decreased function. Left ventricular endocardial border not optimally defined to fully evaluate regional wall motion. Septal hypokinesis worse that other global findings.There is mild concentric left ventricular hypertrophy. Left ventricular diastolic parameters are indeterminate.  2. Right ventricular systolic function is normal. The right ventricular size is normal.  3. The mitral valve is abnormal. Mild mitral  valve regurgitation. No evidence of mitral stenosis.  4. The aortic valve has been repaired/replaced. Aortic valve regurgitation is not visualized. Aortic valve mean gradient measures 13.0 mmHg.  5. Aortic root/ascending aorta has been repaired/replaced. Comparison(s): No prior Echocardiogram. Technically difficult study. FINDINGS  Left Ventricle: Left ventricular ejection fraction, by estimation, is 40%. The left ventricle  has mildly decreased function. Left ventricular endocardial border not optimally defined to evaluate regional wall motion. The left ventricular internal cavity  size was normal in size. There is mild concentric left ventricular hypertrophy. Left ventricular diastolic parameters are indeterminate.  LV Wall Scoring: The anterior septum, mid inferoseptal segment, and basal inferoseptal segment are hypokinetic. Right Ventricle: The right ventricular size is normal. No increase in right ventricular wall thickness. Right ventricular systolic function is normal. Left Atrium: Left atrial size was normal in size. Right Atrium: Right atrial size was normal in size. Pericardium: There is no evidence of pericardial effusion. Mitral Valve: The mitral valve is abnormal. Mild mitral valve regurgitation. No evidence of mitral valve stenosis. Tricuspid Valve: The tricuspid valve is normal in structure. Tricuspid valve regurgitation is trivial. No evidence of tricuspid stenosis. Aortic Valve: The aortic valve has been repaired/replaced. Aortic valve regurgitation is not visualized. Aortic valve mean gradient measures 13.0 mmHg. Aortic valve peak gradient measures 24.6 mmHg. Aortic valve area, by VTI measures 1.21 cm. Pulmonic Valve: The pulmonic valve was not well visualized. Pulmonic valve regurgitation is not visualized. Aorta: The aortic root/ascending aorta has been repaired/replaced. IAS/Shunts: The interatrial septum was not well visualized.  LEFT VENTRICLE PLAX 2D LVIDd:         4.60 cm      Diastology LVIDs:         4.20 cm      LV e' medial:    7.51 cm/s LV PW:         1.60 cm      LV E/e' medial:  6.9 LV IVS:        1.60 cm      LV e' lateral:   10.40 cm/s LVOT diam:     1.80 cm      LV E/e' lateral: 5.0 LV SV:         55 LV SV Index:   30 LVOT Area:     2.54 cm  LV Volumes (MOD) LV vol d, MOD A2C: 98.9 ml LV vol d, MOD A4C: 101.0 ml LV vol s, MOD A2C: 74.0 ml LV vol s, MOD A4C: 45.9 ml LV SV MOD A2C:     24.9 ml LV SV  MOD A4C:     101.0 ml LV SV MOD BP:      42.4 ml RIGHT VENTRICLE RV Basal diam:  3.00 cm RV Mid diam:    2.40 cm RV S prime:     7.18 cm/s TAPSE (M-mode): 1.6 cm LEFT ATRIUM             Index        RIGHT ATRIUM           Index LA diam:        3.20 cm 1.73 cm/m   RA Area:     16.00 cm LA Vol (A2C):   65.5 ml 35.40 ml/m  RA Volume:   37.80 ml  20.43 ml/m LA Vol (A4C):   52.9 ml 28.59 ml/m LA Biplane Vol: 57.8 ml 31.24 ml/m  AORTIC VALVE AV Area (Vmax):    1.29 cm AV Area (Vmean):  1.24 cm AV Area (VTI):     1.21 cm AV Vmax:           248.00 cm/s AV Vmean:          168.000 cm/s AV VTI:            0.454 m AV Peak Grad:      24.6 mmHg AV Mean Grad:      13.0 mmHg LVOT Vmax:         126.00 cm/s LVOT Vmean:        81.800 cm/s LVOT VTI:          0.215 m LVOT/AV VTI ratio: 0.47 MITRAL VALVE               TRICUSPID VALVE MV Area (PHT): 3.60 cm    TR Peak grad:   30.2 mmHg MV Decel Time: 211 msec    TR Vmax:        275.00 cm/s MV E velocity: 51.90 cm/s MV A velocity: 52.90 cm/s  SHUNTS MV E/A ratio:  0.98        Systemic VTI:  0.22 m                            Systemic Diam: 1.80 cm Riley Lam MD Electronically signed by Riley Lam MD Signature Date/Time: 02/02/2023/3:00:04 PM    Final    DG Abd Portable 1V  Result Date: 02/02/2023 CLINICAL DATA:  Encounter for feeding tube placement EXAM: PORTABLE ABDOMEN - 1 VIEW COMPARISON:  None Available. FINDINGS: Enteric tube courses below the diaphragm with the tip likely in the distal stomach near the pylorus. Nonobstructive visualized bowel gas pattern. Suspected bibasilar opacities. IMPRESSION: 1. Enteric tube courses below the diaphragm with the tip likely in the distal stomach near the pylorus. 2. Suspected bibasilar opacities. Dedicated chest x-ray could further evaluate if clinically warranted. Electronically Signed   By: Feliberto Harts M.D.   On: 02/02/2023 13:46   DG CHEST PORT 1 VIEW  Result Date: 02/02/2023 CLINICAL DATA:   Endotracheal tube EXAM: PORTABLE CHEST 1 VIEW COMPARISON:  CXR 02/01/23, CTA Chest 02/01/23 FINDINGS: Endotracheal tube terminates approximately 5 cm above the carina. Enteric tube is slightly retracted with the tip positioned at the level of the GE junction and side hole above the GE junction. Esophageal temperature probe terminates in the upper esophagus. Status post median sternotomy. Unchanged cardiac and mediastinal contours. No pleural effusion. No pneumothorax. Redemonstrated are bibasilar airspace opacities, right-greater-than-left, which could represent atelectasis or infection. No radiographically apparent displaced rib fractures. Visualized upper abdomen is unremarkable IMPRESSION: 1. Endotracheal tube terminates approximately 5 cm above the carina. 2. Enteric tube is slightly retracted with the tip positioned at the level of the GE junction and side hole above the GE junction. Recommend advancement. 3. Redemonstrated are bibasilar airspace opacities, right-greater-than-left, which could represent atelectasis or infection. Electronically Signed   By: Lorenza Cambridge M.D.   On: 02/02/2023 10:27   EEG adult  Result Date: 02/02/2023 Charlsie Quest, MD     02/02/2023  8:58 AM Patient Name: Daniel Arroyo MRN: [id number] Epilepsy Attending: Charlsie Quest Referring Physician/Provider: Gleason, Darcella Gasman, PA-C Date: 02/02/2023 Duration: 22.34 mins Patient history: 51yo M s/p V fib arrest. EEG to evaluate for seizure Level of alertness:  comatose AEDs during EEG study: Propofol Technical aspects: This EEG study was done with scalp electrodes positioned according to the 10-20 International system of electrode placement. Lobbyist  activity was reviewed with band pass filter of 1-70Hz , sensitivity of 7 uV/mm, display speed of 72mm/sec with a 60Hz  notched filter applied as appropriate. EEG data were recorded continuously and digitally stored.  Video monitoring was available and reviewed as appropriate.  Description:  EEG showed continuous generalized low amplitude 2-3Hz  delta slowing admixed with overriding 15 to 18 Hz beta activity distributed symmetrically and diffusely. Hyperventilation and photic stimulation were not performed.   ABNORMALITY - Continuous slow, generalized - Excessive beta, generalized IMPRESSION: This study is suggestive of severe diffuse encephalopathy, nonspecific etiology. No seizures or epileptiform discharges were seen throughout the recording. Priyanka Annabelle Harman   CT HEAD WO CONTRAST ( )  Result Date: 02/02/2023 CLINICAL DATA:  52 year old male with altered mental status. History of chronic right ICA occlusion, right MCA infarction. EXAM: CT HEAD WITHOUT CONTRAST TECHNIQUE: Contiguous axial images were obtained from the base of the skull through the vertex without intravenous contrast. RADIATION DOSE REDUCTION: This exam was performed according to the departmental dose-optimization program which includes automated exposure control, adjustment of the mA and/or kV according to patient size and/or use of iterative reconstruction technique. COMPARISON:  Report of [facility] Head CT 05/19/2019 (no images available). [facility] Face CT 07/26/2022. FINDINGS: Brain: Series 4 axial images are the most diagnostic, least oblique. Chronic right hemisphere encephalomalacia corresponding to they MCA territory, and stable from visible brain parenchyma last year. Mild ex vacuo enlargement of the lateral ventricles. No intracranial mass effect. Maintained gray-white differentiation elsewhere. Streak artifact and/or laminar necrosis suspected in some areas of the right hemisphere. No convincing acute intracranial hemorrhage identified. No cortically based acute infarct identified. Vascular: No suspicious intracranial vascular hyperdensity. Calcified atherosclerosis at the skull base. 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 No acute osseous abnormality identified. Sinuses/Orbits: Visualized paranasal sinuses and mastoids are stable and well aerated. Other: Intubated on the scout view. Partially visible oral enteric tube looping in the oropharynx on series 4, image 37. No acute orbit or scalp soft tissue finding is evident. IMPRESSION: 1. No acute intracranial abnormality identified. Chronic Right MCA territory infarct. 2. Intubated, with partially visible enteric tube looping in the oropharynx. Electronically Signed   By: Odessa Fleming M.D.   On: 02/02/2023 04:46    Cardiac Studies   Cardiac Studies & Procedures       ECHOCARDIOGRAM  ECHOCARDIOGRAM COMPLETE 02/02/2023  Narrative ECHOCARDIOGRAM REPORT    Patient Name:   Daniel Arroyo Date of Exam: 02/02/2023 Medical Rec #:  [id number]       Height:       72.0 in Accession #:    [id number]      Weight:       143.5 lb Date of Birth:  1971/03/13      BSA:          1.850 m Patient Age:    51 years        BP:           172/82 mmHg Patient Gender: M               HR:           88 bpm. Exam Location:  Inpatient  Procedure: 2D Echo, Color Doppler and Cardiac Doppler  Indications:    Cardiac Arrest  History:        Patient has no prior history of Echocardiogram examinations. Arrythmias:Cardiac Arrest. ETOH, Cocaine, and Polysubstance  Abuse.  Sonographer:    Milbert Coulter Referring Phys: [id number] Aliene Beams   Sonographer Comments: No parasternal window, echo performed with patient supine and on artificial respirator and no subcostal window. [employer], contracted left arm. and Image acquisition challenging due to respiratory motion. IMPRESSIONS   1. Left ventricular ejection fraction, by estimation, is 40%. The left ventricle has mildly decreased function. Left ventricular endocardial border not optimally defined to fully evaluate regional wall motion. Septal hypokinesis worse that other global findings.There is mild  concentric left ventricular hypertrophy. Left ventricular diastolic parameters are indeterminate. 2. Right ventricular systolic function is normal. The right ventricular size is normal. 3. The mitral valve is abnormal. Mild mitral valve regurgitation. No evidence of mitral stenosis. 4. The aortic valve has been repaired/replaced. Aortic valve regurgitation is not visualized. Aortic valve mean gradient measures 13.0 mmHg. 5. Aortic root/ascending aorta has been repaired/replaced.  Comparison(s): No prior Echocardiogram. Technically difficult study.  FINDINGS Left Ventricle: Left ventricular ejection fraction, by estimation, is 40%. The left ventricle has mildly decreased function. Left ventricular endocardial border not optimally defined to evaluate regional wall motion. The left ventricular internal cavity size was normal in size. There is mild concentric left ventricular hypertrophy. Left ventricular diastolic parameters are indeterminate.   LV Wall Scoring: The anterior septum, mid inferoseptal segment, and basal inferoseptal segment are hypokinetic.  Right Ventricle: The right ventricular size is normal. No increase in right ventricular wall thickness. Right ventricular systolic function is normal.  Left Atrium: Left atrial size was normal in size.  Right Atrium: Right atrial size was normal in size.  Pericardium: There is no evidence of pericardial effusion.  Mitral Valve: The mitral valve is abnormal. Mild mitral valve regurgitation. No evidence of mitral valve stenosis.  Tricuspid Valve: The tricuspid valve is normal in structure. Tricuspid valve regurgitation is trivial. No evidence of tricuspid stenosis.  Aortic Valve: The aortic valve has been repaired/replaced. Aortic valve regurgitation is not visualized. Aortic valve mean gradient measures 13.0 mmHg. Aortic valve peak gradient measures 24.6 mmHg. Aortic valve area, by VTI measures 1.21 cm.  Pulmonic Valve: The pulmonic  valve was not well visualized. Pulmonic valve regurgitation is not visualized.  Aorta: The aortic root/ascending aorta has been repaired/replaced.  IAS/Shunts: The interatrial septum was not well visualized.   LEFT VENTRICLE PLAX 2D LVIDd:         4.60 cm      Diastology LVIDs:         4.20 cm      LV e' medial:    7.51 cm/s LV PW:         1.60 cm      LV E/e' medial:  6.9 LV IVS:        1.60 cm      LV e' lateral:   10.40 cm/s LVOT diam:     1.80 cm      LV E/e' lateral: 5.0 LV SV:         55 LV SV Index:   30 LVOT Area:     2.54 cm  LV Volumes (MOD) LV vol d, MOD A2C: 98.9 ml LV vol d, MOD A4C: 101.0 ml LV vol s, MOD A2C: 74.0 ml LV vol s, MOD A4C: 45.9 ml LV SV MOD A2C:     24.9 ml LV SV MOD A4C:     101.0 ml LV SV MOD BP:      42.4 ml  RIGHT VENTRICLE RV Basal diam:  3.00 cm  RV Mid diam:    2.40 cm RV S prime:     7.18 cm/s TAPSE (M-mode): 1.6 cm  LEFT ATRIUM             Index        RIGHT ATRIUM           Index LA diam:        3.20 cm 1.73 cm/m   RA Area:     16.00 cm LA Vol (A2C):   65.5 ml 35.40 ml/m  RA Volume:   37.80 ml  20.43 ml/m LA Vol (A4C):   52.9 ml 28.59 ml/m LA Biplane Vol: 57.8 ml 31.24 ml/m AORTIC VALVE AV Area (Vmax):    1.29 cm AV Area (Vmean):   1.24 cm AV Area (VTI):     1.21 cm AV Vmax:           248.00 cm/s AV Vmean:          168.000 cm/s AV VTI:            0.454 m AV Peak Grad:      24.6 mmHg AV Mean Grad:      13.0 mmHg LVOT Vmax:         126.00 cm/s LVOT Vmean:        81.800 cm/s LVOT VTI:          0.215 m LVOT/AV VTI ratio: 0.47  MITRAL VALVE               TRICUSPID VALVE MV Area (PHT): 3.60 cm    TR Peak grad:   30.2 mmHg MV Decel Time: 211 msec    TR Vmax:        275.00 cm/s MV E velocity: 51.90 cm/s MV A velocity: 52.90 cm/s  SHUNTS MV E/A ratio:  0.98        Systemic VTI:  0.22 m Systemic Diam: 1.80 cm  Riley Lam MD Electronically signed by Riley Lam MD Signature Date/Time:  02/02/2023/3:00:04 PM    Final              Patient Profile     52 y.o. 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 Merrily Pew.   Assessment & Plan    Cardiac arrest  Ventricular Fibrillation Severe hypokalemia and hypocalcemia Blood loss anemia requiring transfusion Elevated troponin -chest pain was not main symptom, but with newly reduced EF and VF arrest, can consider cath prior to discharge based on clinical trajectory -suspect electrolyte abnormalities may have driven VT/VF -electrolytes being repleted -continue amiodarone for now   We will follow clinical recovery for timing of cath and consideration of ICD if indicated.  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

## 2023-02-03 NOTE — Progress Notes (Signed)
Per Dr. Merrily Pew, discontinue targeted temperature management.  Cooling machine turned off and pads removed from patient at 1342.  Darrold Span

## 2023-02-03 NOTE — Progress Notes (Signed)
1522 Ear lobe blood sugar check 101 1524 Finger prick blood sugar check 66 1529 Central line blood sugar check 101  Daniel Arroyo

## 2023-02-03 NOTE — Progress Notes (Signed)
Brief Nutrition Support Note  Discussed pt with RN and Pharmacist. Pt tolerating tube feeds at 20 ml/hr via Cortrak. Cortrak tube tip likely in the distal stomach near the pylorus per abdominal x-ray yesterday. Electrolyte labs improved compared to yesterday.  Potassium: <2.0 --> 4.8 --> 2.8 (pt receiving IV KCl 10 mEq x 4) Phosphorus: 2.2 --> 4.0 --> 3.0 Magnesium: 1.3 -->2.4 --> 1.9 (pt received IV magnesium sulfate 2 grams x 1 this AM)  Will begin to slowly advance tube feeding rate to goal. Close monitoring of electrolytes is recommended. Refeeding labs have been ordered.  Tube feeding via Cortrak: - Increase Vital 1.5 to 30 ml/hr today at 1000 and continue to advance rate by 10 ml q 12 hours to goal rate of 55 ml/hr (1320 ml/day) - PROSource TF20 60 ml daily  Tube feeding regimen at goal rate provides 2060 kcal, 109 grams of protein, and 1008 ml of H2O.   RD will continue to follow pt during admission.   Mertie Clause, MS, RD, LDN Inpatient Clinical Dietitian Please see AMiON for contact information.

## 2023-02-04 ENCOUNTER — Inpatient Hospital Stay (HOSPITAL_COMMUNITY): Payer: PPO

## 2023-02-04 DIAGNOSIS — I4901 Ventricular fibrillation: Secondary | ICD-10-CM | POA: Diagnosis not present

## 2023-02-04 DIAGNOSIS — I75029 Atheroembolism of unspecified lower extremity: Secondary | ICD-10-CM

## 2023-02-04 DIAGNOSIS — G9340 Encephalopathy, unspecified: Secondary | ICD-10-CM | POA: Diagnosis not present

## 2023-02-04 DIAGNOSIS — I639 Cerebral infarction, unspecified: Secondary | ICD-10-CM

## 2023-02-04 DIAGNOSIS — I6340 Cerebral infarction due to embolism of unspecified cerebral artery: Secondary | ICD-10-CM | POA: Diagnosis not present

## 2023-02-04 DIAGNOSIS — I469 Cardiac arrest, cause unspecified: Secondary | ICD-10-CM | POA: Diagnosis not present

## 2023-02-04 DIAGNOSIS — I493 Ventricular premature depolarization: Secondary | ICD-10-CM | POA: Diagnosis not present

## 2023-02-04 DIAGNOSIS — K922 Gastrointestinal hemorrhage, unspecified: Secondary | ICD-10-CM | POA: Diagnosis not present

## 2023-02-04 DIAGNOSIS — I6349 Cerebral infarction due to embolism of other cerebral artery: Secondary | ICD-10-CM | POA: Diagnosis not present

## 2023-02-04 LAB — CBC
HCT: 31.6 % — ABNORMAL LOW (ref 39.0–52.0)
Hemoglobin: 10.5 g/dL — ABNORMAL LOW (ref 13.0–17.0)
MCH: 25.5 pg — ABNORMAL LOW (ref 26.0–34.0)
MCHC: 33.2 g/dL (ref 30.0–36.0)
MCV: 76.7 fL — ABNORMAL LOW (ref 80.0–100.0)
Platelets: 246 10*3/uL (ref 150–400)
RBC: 4.12 MIL/uL — ABNORMAL LOW (ref 4.22–5.81)
RDW: 18.8 % — ABNORMAL HIGH (ref 11.5–15.5)
WBC: 9.5 10*3/uL (ref 4.0–10.5)
nRBC: 0 % (ref 0.0–0.2)

## 2023-02-04 LAB — GLUCOSE, CAPILLARY
Glucose-Capillary: 114 mg/dL — ABNORMAL HIGH (ref 70–99)
Glucose-Capillary: 116 mg/dL — ABNORMAL HIGH (ref 70–99)
Glucose-Capillary: 101 mg/dL — ABNORMAL HIGH (ref 70–99)
Glucose-Capillary: 112 mg/dL — ABNORMAL HIGH (ref 70–99)
Glucose-Capillary: 96 mg/dL (ref 70–99)
Glucose-Capillary: 121 mg/dL — ABNORMAL HIGH (ref 70–99)

## 2023-02-04 LAB — BASIC METABOLIC PANEL
Anion gap: 10 (ref 5–15)
Anion gap: 10 (ref 5–15)
BUN: 11 mg/dL (ref 6–20)
BUN: 11 mg/dL (ref 6–20)
CO2: 20 mmol/L — ABNORMAL LOW (ref 22–32)
CO2: 20 mmol/L — ABNORMAL LOW (ref 22–32)
Calcium: 7.3 mg/dL — ABNORMAL LOW (ref 8.9–10.3)
Calcium: 7.2 mg/dL — ABNORMAL LOW (ref 8.9–10.3)
Chloride: 109 mmol/L (ref 98–111)
Chloride: 107 mmol/L (ref 98–111)
Creatinine, Ser: 0.7 mg/dL (ref 0.61–1.24)
Creatinine, Ser: 0.75 mg/dL (ref 0.61–1.24)
GFR, Estimated: >60 mL/min (ref >60)
GFR, Estimated: >60 mL/min (ref >60)
Glucose, Bld: 124 mg/dL — ABNORMAL HIGH (ref 70–99)
Glucose, Bld: 179 mg/dL — ABNORMAL HIGH (ref 70–99)
Potassium: 2.3 mmol/L — CL (ref 3.5–5.1)
Potassium: 3.1 mmol/L — ABNORMAL LOW (ref 3.5–5.1)
Sodium: 139 mmol/L (ref 135–145)
Sodium: 137 mmol/L (ref 135–145)

## 2023-02-04 LAB — URINE CULTURE

## 2023-02-04 LAB — CULTURE, BLOOD (ROUTINE X 2): Special Requests: ADEQUATE

## 2023-02-04 LAB — MAGNESIUM
Magnesium: 1.7 mg/dL (ref 1.7–2.4)
Magnesium: 1.7 mg/dL (ref 1.7–2.4)

## 2023-02-04 LAB — PHOSPHORUS
Phosphorus: 2.4 mg/dL — ABNORMAL LOW (ref 2.5–4.6)
Phosphorus: 3 mg/dL (ref 2.5–4.6)

## 2023-02-04 LAB — CULTURE, RESPIRATORY W GRAM STAIN

## 2023-02-04 MED ORDER — METOPROLOL TARTRATE 5 MG/5ML IV SOLN: 2.5000 mg | INTRAVENOUS | Status: DC | PRN

## 2023-02-04 MED ORDER — STROKE: EARLY STAGES OF RECOVERY BOOK
Freq: Once | Status: AC
  Administered 2023-02-05: 1
  Filled 2023-02-04: qty 1

## 2023-02-04 MED ORDER — ASPIRIN 81 MG PO CHEW
81.0000 mg | CHEWABLE_TABLET | Freq: Once | ORAL | Status: AC
  Administered 2023-02-04: 81 mg
  Filled 2023-02-04: qty 1

## 2023-02-04 MED ORDER — POTASSIUM CHLORIDE 20 MEQ PO PACK
40.0000 meq | PACK | Freq: Three times a day (TID) | ORAL | Status: DC
  Administered 2023-02-04: 40 meq via ORAL
  Filled 2023-02-04: qty 2

## 2023-02-04 MED ORDER — AMIODARONE HCL IN DEXTROSE 360-4.14 MG/200ML-% IV SOLN
30.0000 mg/h | INTRAVENOUS | Status: DC
  Administered 2023-02-04: 30 mg/h via INTRAVENOUS
  Filled 2023-02-04: qty 200

## 2023-02-04 MED ORDER — ERYTHROMYCIN 5 MG/GM OP OINT
TOPICAL_OINTMENT | Freq: Four times a day (QID) | OPHTHALMIC | Status: DC
  Administered 2023-02-04 – 2023-02-05 (×5): 1 via OPHTHALMIC
  Filled 2023-02-04: qty 3.5

## 2023-02-04 MED ORDER — POTASSIUM CHLORIDE 10 MEQ/100ML IV SOLN
10.0000 meq | INTRAVENOUS | Status: AC
  Administered 2023-02-04 (×4): 10 meq via INTRAVENOUS
  Filled 2023-02-04 (×4): qty 100

## 2023-02-04 MED ORDER — MIDAZOLAM HCL 2 MG/2ML IJ SOLN
2.0000 mg | INTRAMUSCULAR | Status: DC | PRN
  Administered 2023-02-04 – 2023-02-08 (×9): 2 mg via INTRAVENOUS
  Filled 2023-02-04 (×8): qty 2

## 2023-02-04 MED ORDER — SODIUM CHLORIDE 0.9 % IV SOLN
3.0000 g | Freq: Four times a day (QID) | INTRAVENOUS | Status: DC
  Administered 2023-02-04 – 2023-02-07 (×12): 3 g via INTRAVENOUS
  Filled 2023-02-04 (×12): qty 8

## 2023-02-04 MED ORDER — AMLODIPINE BESYLATE 5 MG PO TABS
5.0000 mg | ORAL_TABLET | Freq: Every day | ORAL | Status: DC
  Administered 2023-02-05 – 2023-02-07 (×3): 5 mg
  Filled 2023-02-04 (×3): qty 1

## 2023-02-04 MED ORDER — POTASSIUM CHLORIDE 20 MEQ PO PACK
40.0000 meq | PACK | Freq: Three times a day (TID) | ORAL | Status: AC
  Administered 2023-02-04 (×2): 40 meq
  Filled 2023-02-04 (×2): qty 2

## 2023-02-04 MED ORDER — POTASSIUM & SODIUM PHOSPHATES 280-160-250 MG PO PACK
1.0000 | PACK | Freq: Three times a day (TID) | ORAL | Status: DC
  Administered 2023-02-04 – 2023-02-07 (×9): 1
  Filled 2023-02-04 (×9): qty 1

## 2023-02-04 MED ORDER — MIDAZOLAM HCL 2 MG/2ML IJ SOLN
INTRAMUSCULAR | Status: AC
  Filled 2023-02-04: qty 2

## 2023-02-04 MED ORDER — ATORVASTATIN CALCIUM 40 MG PO TABS
40.0000 mg | ORAL_TABLET | Freq: Every day | ORAL | Status: DC
  Administered 2023-02-04: 40 mg via ORAL
  Filled 2023-02-04: qty 1

## 2023-02-04 MED ORDER — POTASSIUM CHLORIDE 10 MEQ/50ML IV SOLN: 10.0000 meq | INTRAVENOUS | Status: DC

## 2023-02-04 MED ORDER — MAGNESIUM SULFATE 2 GM/50ML IV SOLN
2.0000 g | Freq: Once | INTRAVENOUS | Status: AC
  Administered 2023-02-04: 2 g via INTRAVENOUS
  Filled 2023-02-04: qty 50

## 2023-02-04 MED ORDER — ATORVASTATIN CALCIUM 40 MG PO TABS
40.0000 mg | ORAL_TABLET | Freq: Every day | ORAL | Status: DC
  Administered 2023-02-05 – 2023-02-07 (×3): 40 mg
  Filled 2023-02-04 (×3): qty 1

## 2023-02-04 MED ORDER — AMLODIPINE BESYLATE 5 MG PO TABS
5.0000 mg | ORAL_TABLET | Freq: Every day | ORAL | Status: DC
  Administered 2023-02-04: 5 mg via ORAL
  Filled 2023-02-04: qty 1

## 2023-02-04 NOTE — Progress Notes (Signed)
Lower extremity venous and lower extremity arterial duplex studies completed.  Preliminary results relayed to providers and RN on the floor.   See CV Proc for preliminary results report.   Jean Rosenthal, RDMS, RVT

## 2023-02-04 NOTE — Progress Notes (Addendum)
STROKE TEAM PROGRESS NOTE   INTERVAL HISTORY No family at the bedside.  Patient is on the ventilator with no sedation resting comfortably. MRI brain with acute infarcts in right occipital lobe, left inferior cerebellum, bilateral caudate heads and anterior right frontal lobe Vital signs are stable not requiring any blood pressure medications IV or as needed  Labs this morning potassium 2.3 and has been replaced, phosphorus 2.4 and replaced  Vitals:   02/04/23 1330 02/04/23 1400 02/04/23 1403 02/04/23 1430  BP: (!) 163/109 (!) 153/97 (!) 153/97 (!) 148/100  Pulse: (!) 118 (!) 116  (!) 107  Resp: (!) 24 20  19   Temp:      TempSrc:      SpO2: 97% 96%  99%  Weight:      Height:       CBC:  Recent Labs  Lab 02/03/23 0238 02/04/23 0741  WBC 8.6 9.5  HGB 10.3* 10.5*  HCT 30.8* 31.6*  MCV 78.0* 76.7*  PLT 199 246   Basic Metabolic Panel:  Recent Labs  Lab 02/03/23 1337 02/03/23 1919 02/04/23 0741  NA 137  --  139  K 4.2  --  2.3*  CL 107  --  109  CO2 22  --  20*  GLUCOSE 108*  --  124*  BUN 14  --  11  CREATININE 0.86  --  0.70  CALCIUM 7.2*  --  7.3*  MG  --  1.8 1.7  PHOS  --  1.9* 2.4*   Lipid Panel:  Recent Labs  Lab 02/03/23 0238  TRIG 110   HgbA1c:  Recent Labs  Lab 02/02/23 0219  HGBA1C 5.1   Urine Drug Screen:  Recent Labs  Lab 02/02/23 0227  LABOPIA NONE DETECTED  COCAINSCRNUR NONE DETECTED  LABBENZ NONE DETECTED  AMPHETMU NONE DETECTED  THCU NONE DETECTED  LABBARB NONE DETECTED    Alcohol Level  Recent Labs  Lab 02/02/23 1036  ETH <10    IMAGING past 24 hours MR BRAIN WO CONTRAST  Result Date: 02/03/2023 CLINICAL DATA:  Altered mental status, nontraumatic EXAM: MRI HEAD WITHOUT CONTRAST TECHNIQUE: Multiplanar, multiecho pulse sequences of the brain and surrounding structures were obtained without intravenous contrast. COMPARISON:  No prior MRI available, correlation is made with CT head 02/02/2023 FINDINGS: Brain: Restricted  diffusion with ADC correlate most focally right occipital lobe (series 5, images 72-78), as well as in the left inferior left cerebellum (series 5, images 60 and 61), consistent with acute infarcts. Additional foci of restricted diffusion with definite ADC correlates are seen in the bilateral caudate heads (series 5, image 77) and anterior right frontal lobe (series 5, image 96). Foci in the right frontal lobe adjacent to the remote infarct (series 5, images 90 and 91) may be related to susceptibility. The area in the right occipital lobe is associated with increased T2 hyperintense signal. No acute hemorrhage, mass, or mass effect. No extra-axial collection. Redemonstrated extensive encephalomalacia in the right MCA territory, with ex vacuo dilatation of the bilateral lateral ventricles. Approximately 7 mm of left-to-right midline shift, which appears similar to the 07/26/2022 CT head. Hemosiderin deposition is associated with the right MCA territory infarct, consistent with remote petechial hemorrhage. Vascular: Normal arterial flow voids. Skull and upper cervical spine: Normal marrow signal. Sinuses/Orbits: Clear paranasal sinuses. No acute finding in the orbits. Other: Trace fluid in right mastoid air cells. IMPRESSION: Acute infarcts in the right occipital lobe, left inferior cerebellum, bilateral caudate heads, and anterior right frontal lobe.  No evidence of acute hemorrhage. These results will be called to the ordering clinician or representative by the Radiologist Assistant, and communication documented in the PACS or [employer]. Electronically Signed   By: Wiliam Ke M.D.   On: 02/03/2023 20:04    PHYSICAL EXAM  Temp:  [98.1 F (36.7 C)-99.5 F (37.5 C)] 99.4 F (37.4 C) (05/17 1130) Pulse Rate:  [74-124] 107 (05/17 1430) Resp:  [11-25] 19 (05/17 1430) BP: (123-167)/(72-122) 148/100 (05/17 1430) SpO2:  [95 %-100 %] 99 % (05/17 1430) FiO2 (%):  [40 %] 40 % (05/17 1144)  General  -critically ill, intubated male patient Cardiovascular - Regular rhythm and rate.  Mental Status -  Eyes are closed, head tilted to the right, opens to voice. He does not follow any commands, PERRL, does not appear to track, eyes are midline,  corneal reflex intact bilaterally, no blink to threat bilaterally  Motor Strength -left upper extremity with severe contractures, but with withdrawal to pain full stimuli, right upper no movement to noxious stimuli, bilateral withdrawal in lower extremities Sensory -response to noxious stimuli  Coordination -unable to assess  Gait and Station - deferred.  ASSESSMENT/PLAN [name] is a 52 y.o. 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 While in the ED at [facility], he had a brief episode of V-fib arrest with ROSC achieved after 5 minutes of CPR.   Stroke: Scattered bilateral acute ischemic infarcts, cardioembolic pattern, etiology unclear, may from NSTEMI, endocarditis, recurrent aortic dissection CT head No acute abnormality.  Chronic right MCA infarct CTA head & neck once pt more stabilized and family desire aggressive care MRI  acute infarcts in right occipital lobe, left inferior cerebellum, bilateral caudate heads and anterior right frontal lobe US venous BLE no DVT 2D Echo EF 40%.  Left ventricle mildly decreased function, septal hypokinesis, concentric left ventricular hypertrophy May consider TEE if pt improves with family desires aggressive care LDL pending HgbA1c 5.1 UDS neg  VTE prophylaxis - SCD's/Lovenox  No antithrombotics prior to admission, now on no antithrombotics.  Due to concern for  GI bleed Therapy recommendations: Pending Disposition: Pending, per RN, family would like to watch for several days, if not improving, will lean toward comfort care  Hypertension Aortic dissection AVR  HFrEF  s/p aortic arch and valve replacement EF 40%, concentric left ventricular hypertrophy BP Stable Long-term BP goal normotensive Consider CTA aorta to evaluate aortic dissection once stable and family desire aggressive care  Possible STEMI Episode of V-fib arrest S/p CPR and ROSC achieved after 5 minutes  Cardiology on board Could be due to severe hypokalemia with K < 2.0 On amiodarone  PAD Right arterial US - 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 Appearance of acute thrombus noted. Reconstitution  distally via collatels with monophasic flow observed in the PTA, DPA, and  Pero A.  Consider CTA aorta to evaluate aortic dissection once stable and family desire aggressive care  Hyperlipidemia Home meds: None LDL pending goal < 70 on atorvastatin 40 mg Continue statin at discharge  Acute hypoxic respiratory failure Concern for aspiration pneumonia Intubated  On Zosyn Vent management per CCM  Sepsis Colitis  with reported melena  UTI On Zosyn On Protonix twice daily Per CCM  Dysphagia OG tube Nutrition consult for tube feeds Feeds infusing Speech therapy when able  Other Stroke Risk Factors ETOH use, alcohol level <10, advised to drink no more than 2 drink(s) a day History of polysubstance abuse Hx stroke/TIA - 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 Pt was seen and examined.   No family at bedside.  Patient lying in bed, intubated, unresponsive, not follow commands, eyes open with painful stimuli, not blinking to be described, eye midline, not tracking.  Constant coughing, fighting with vent, bilateral corneal reflexes present, PERRL. Head on right turning posturing, LUE with severe contractures, but with withdrawal to pain full stimuli, RUE no movement to noxious stimuli, bilateral LE with extension contracture proximally and foot drop contracture distally.  MRI showed embolic shower and RLE arterial ultrasound showed acute popliteal artery occlusion, likely acute thrombus.  Etiology concerning for cardioembolic source, could be from non-STEMI, endocarditis or aortic dissection.  Further workup pending patient stability and goals for care.  Currently antithrombotic on hold given concern of GI bleeding.  On statin, will follow.  For detailed assessment and plan, please refer to above/below as I have made changes wherever appropriate.   Marvel Plan, MD PhD Stroke Neurology 02/04/2023 7:19 PM  This patient is critically ill due to embolic stroke, respiratory failure, non-STEMI, GI bleeding, sepsis and at significant risk of neurological worsening, death form multiorgan failure, recurrent stroke, seizure, heart failure. This patient's care requires constant monitoring of vital signs, hemodynamics, respiratory and cardiac monitoring, review of multiple databases, neurological assessment, discussion with family, other specialists and medical decision making of high complexity. I spent 35 minutes of neurocritical care time in the care of this patient.    To contact Stroke Continuity provider, please refer to [email or web address]. After hours, contact General Neurology

## 2023-02-04 NOTE — Progress Notes (Signed)
Pharmacy Antibiotic Note  Daniel Arroyo is a 52 y.o. male admitted on 02/02/2023 with concern for aspiration pneumonia s/p Vfib arrest. Urine culture positive for 60,000 col/ml E. faecalis. Pt reported severe diarrhea prior to cardiac arrest and there is concern for colitis. Pharmacy has been consulted for Unasyn (ampicillin/sulbactam) dosing.    WBC 9.5, Tmax 99.5 SCr 0.7  Plan: Unasyn 3g IV q6h Monitor daily CBC, temp, SCr, and for clinical signs of improvement  F/u cultures and de-escalate antibiotics as able   Height: 6' 0.01" (182.9 cm) Weight: 68.7 kg (151 lb 7.3 oz) IBW/kg (Calculated) : 77.62  Temp (24hrs), Avg:98.7 F (37.1 C), Min:98.1 F (36.7 C), Max:99.5 F (37.5 C)  Recent Labs  Lab 02/02/23 0219 02/02/23 1036 02/02/23 1539 02/02/23 1715 02/03/23 0238 02/03/23 1337 02/04/23 0741  WBC 14.1*  --   --   --  8.6  --  9.5  CREATININE 1.14 1.06  --  0.93 0.90 0.86 0.70  LATICACIDVEN  --  2.6* 1.4  --   --   --   --     Estimated Creatinine Clearance: 103.8 mL/min (by C-G formula based on SCr of 0.7 mg/dL).    No Known Allergies  Antimicrobials this admission: Zosyn 5/15 >> 5/17 Unasyn 5/17 >>   Dose adjustments this admission: N/A  Microbiology results: 5/15 BCx x2: NGTD x2d 5/15 UCx: 60K Enterococcus faecalis  5/15 tracheal aspirate cx: few GPC, few yeast - reincubated for better growth  5/15 MRSA PCR: not detected  Thank you for allowing pharmacy to be a part of this patient's care.  Wilburn Cornelia, PharmD, BCPS Clinical Pharmacist 02/04/2023 1:12 PM   Please refer to [facility] for pharmacy phone number

## 2023-02-04 NOTE — IPAL (Signed)
  Interdisciplinary Goals of Care Family Meeting   Date carried out: 02/04/2023  Location of the meeting: Phone conference  Member's involved: Nurse Practitioner, Bedside Registered Nurse, and Family Member or next of kin  Durable Power of Attorney or acting medical decision maker: Ludger Nutting (Son)    Discussion: We discussed goals of care for [employer] .  Ludger Nutting is clear he wishes there to be a DNR order in place if the patient were to unfortunately suffer a cardiac arrest. He requests fully aggressive medical care otherwise.   Code status:   Code Status: Full Code   Disposition: Continue current acute care  Time spent for the meeting: 10 minutes    Duayne Cal, NP  02/04/2023, 6:22 PM

## 2023-02-04 NOTE — Progress Notes (Signed)
NAME:  Daniel Arroyo, MRN:  [id number], DOB:  April 11, 1971, LOS: 2 ADMISSION DATE:  02/02/2023, CONSULTATION DATE:  02/04/23 REFERRING MD:  EDP, CHIEF COMPLAINT:  cardiac arrest   History of Present Illness:  Daniel Arroyo is a 52 y.o. M with PMH significant for polysubstance abuse and CVA with residual RUE deficits, LLE  injury for which never sought treatment for which never sought treatment and subsequent foot contracture and difficulty ambulating, Type II aortic dissection, bipolar disorder and ADHD who initially presented to [facility] [place] with three days of abdominal pain dark stools.   His potassium was markedly low at 1.6, Hgb WNL. He was treated with octreotide, pantoprazole, antibiotics, and given potassium for his hypokalemia. An abdominal CT showed colitis. He also was found to have aortic dissection which was previously known and unchanged from his previous CT. He then experienced a V-fib cardiac arrest and had CPR for 5 minutes with standard ACLS. He was intubated and briefly required pressors.  CXR with RLL infiltrate. The ED provider spoke with TCTS regarding dissection as follows "Given his history of type II aortic dissection a CT was done of his chest, now showing a type I aortic dissection. The case was discussed with cardio thoracic surgery who found in his records that he has had an aortic arch and valve replacement and therefore is not a surgical candidate. He should be treated as a type II / B dissection"  He was transported to [facility] and arrived hemodynamically stable but not responsive with sedation wean.   Per his father who is at the bedside, pt lives alone and he thinks has been smoking marijuana.  He is unsure about other drugs, does not think he has been drinking a significant amount of alcohol.  Pt is minimally able to care for himself, his father was checking on him 1-2x per day until a couple of weeks ago when pt was verbally abusive, so he stopped coming and has not  seen him recently until today   Pertinent  Medical History   has a past medical history of ADHD, adult residual type, Anxiety and depression, Bipolar disorder (HCC), Depression, Gastroesophageal reflux disease, History of stroke (2018), Knee pain, Polysubstance abuse (HCC), and Stroke (HCC).   Significant Hospital Events: Including procedures, antibiotic start and stop dates in addition to other pertinent events   5/15 transferred from [facility] intubated after brief V-fib arrest, not on pressors  5/16 Echo lvef 40%; not able to evaluate regional wall motion  Interim History / Subjective:   No acute events overnight Weaned all day yesterday Off sedation x24 hours Still with dark stools Amio drip  Objective   Blood pressure (!) 154/103, pulse (!) 104, temperature 99.5 F (37.5 C), temperature source Axillary, resp. rate 16, height 6' 0.01" (1.829 m), weight 68.7 kg, SpO2 100 %.    Vent Mode: PRVC FiO2 (%):  [40 %] 40 % Set Rate:  [12 bmp] 12 bmp Vt Set:  [550 mL] 550 mL PEEP:  [5 cmH20] 5 cmH20 Pressure Support:  [5 cmH20] 5 cmH20 Plateau Pressure:  [8 cmH20-18 cmH20] 14 cmH20   Intake/Output Summary (Last 24 hours) at 02/04/2023 0737 Last data filed at 02/04/2023 0600 Gross per 24 hour  Intake 1814.46 ml  Output 550 ml  Net 1264.46 ml    Filed Weights   02/02/23 0148 02/02/23 0200 02/03/23 0500  Weight: 65.5 kg 65.1 kg 68.7 kg    General:  Frail, contracted middle aged male patient HEENT: MMM,  PERRL, no JVD. Injected sclerae Neuro: eyes open to voice. Seems to track. Does not blink to threat. Corneals intact. Withdrawals from pain in lowers. Does not follow commands.  CV: RRR, no MRG PULM:  Diminished bases.  GI: Soft, NT, ND. Dark stool in flexi.  Extremities: warm/dry, no edema; decreased mucle tone Skin: no rashes or lesions   Urine cx 60k colonies E. faecalis  Resolved Hospital Problem list     Assessment & Plan:   Witnessed in hospital V-fib arrest likely  secondary to electrolyte abnormalities  Approximately 5 minute code in the ED Hypokalemia Hypomagnesemia  Hypocalcemia Initially profoundly low K at 1.6 P: -continuous telemetry monitoring -cards consulted; appreciate recs -replete electrolytes K + mag -serial bmp; trend mag -amiodarone drip -f/u cultures  Acute Encephalopathy Acute CVA - MRI 5/17 showing multifocal acute infarcts Hx CVA: w/ RUE deficits; Baseline L hemiplegia and poor mobility Hx of polysubstance abuse and alcohol use P: -concern for anoxic injury post arrest -Stroke service to see -off sedation -thiamine, folic acid, mvi -PT/OT when appropriate  Acute Hypoxic Respiratory Failure Likely RLL aspiration PNA P: -SBT during day as tolerated; will hold on extubation given poor mental status -VAP bundle in place -Daily SAT and SBT -RASS goal 0 to -1 -f/u trach aspirate -continue zosyn  Sepsis Colitis with reported melena  UTI P: -trend cbc  -cont ppi bid -cont zosyn  -f/u cultures -stools dark but Hgb stable. Will continue VTE ppx lovenox.   Type 1 Aortic dissection AVR HTN HFrEF: echo 5/15 ef 40% Sees vascular and cardiology and [facility], had ascending arch and proximal hemiarch replacement due to dissection along with bioprosthetic AVR in 08/2020 -scan reviewed by Dr. Leafy Ro, not a candidate for operative intervention P: -BP control, prn hydralazine -hold home lasix  Protein calorie malnutrition POA P: -TF per RD  Bipolar disorder, ADHD P: -hold home adderall, depakote, remeron, restoril and lexapro  Conjunctivitis - erythromycin ointment  Son [place] updated via phone.   Best Practice (right click and "Reselect all SmartList Selections" daily)   Diet/type: tubefeeds DVT prophylaxis: LMWH GI prophylaxis: PPI Lines: Central line Foley:  Yes, and it is still needed Code Status:  full code Last date of multidisciplinary goals of care discussion [5/15 son [place] updated at bedside. Upset  that he was not contacted first when he arrived to [facility]. He states he is the POA and would like to be contacted first for decision making. ]  Critical care time:  41 minutes     Joneen Roach, AGACNP-BC [facility]  See Amion for personal pager PCCM on call pager [phone] until 7pm. Please call Elink 7p-7a. [phone]  02/04/2023 7:41 AM

## 2023-02-04 NOTE — Progress Notes (Signed)
PCCM INTERVAL PROGRESS NOTE  Venous duplex prelim negative, but concern raised for arterial thrombus.   Arterial duplex of bilateral lower extremities pending.    Joneen Roach, AGACNP-BC [facility]  See Amion for personal pager PCCM on call pager [phone] until 7pm. Please call Elink 7p-7a. [phone]  02/04/2023 6:49 PM

## 2023-02-04 NOTE — Progress Notes (Signed)
Rounding Note    Patient Name: Daniel Arroyo Date of Encounter: 02/04/2023  [facility] Cardiologist: Nona Dell, MD   Subjective   Remains intubated and sedated. Neurology consulted yesterday, concern for cardioembolic source of scattered acute infarcts seen on MRI (old infarct/encephalomalacia noted as well).  Inpatient Medications    Scheduled Meds:  amLODipine  5 mg Oral Daily   [START ON 02/05/2023] atorvastatin  40 mg Per Tube Daily   Chlorhexidine Gluconate Cloth  6 each Topical Daily   docusate  100 mg Per Tube BID   enoxaparin (LOVENOX) injection  40 mg Subcutaneous Q24H   erythromycin   Both Eyes Q6H   feeding supplement (PROSource TF20)  60 mL Per Tube Daily   folic acid  1 mg Per Tube Daily   insulin aspart  0-9 Units Subcutaneous Q4H   multivitamin with minerals  1 tablet Per Tube Daily   mouth rinse  15 mL Mouth Rinse Q2H   pantoprazole (PROTONIX) IV  40 mg Intravenous Q12H   polyethylene glycol  17 g Per Tube Daily   potassium & sodium phosphates  1 packet Per Tube TID   potassium chloride  40 mEq Per Tube TID   sodium chloride flush  10-40 mL Intracatheter Q12H   thiamine  100 mg Per Tube Daily   Continuous Infusions:  sodium chloride Stopped (02/02/23 1803)   ampicillin-sulbactam (UNASYN) IV     dexmedetomidine (PRECEDEX) IV infusion Stopped (02/03/23 0947)   feeding supplement (VITAL 1.5 CAL) 55 mL/hr at 02/04/23 1052   fentaNYL infusion INTRAVENOUS Stopped (02/03/23 0744)   potassium chloride 10 mEq (02/04/23 1308)   PRN Meds: sodium chloride, acetaminophen, albuterol, fentaNYL (SUBLIMAZE) injection, fentaNYL (SUBLIMAZE) injection, hydrALAZINE, metoprolol tartrate, naphazoline-glycerin, mouth rinse, sodium chloride flush   Vital Signs    Vitals:   02/04/23 1130 02/04/23 1200 02/04/23 1230 02/04/23 1304  BP: (!) 167/111 (!) 155/102 (!) 149/103 (!) 163/122  Pulse: (!) 104 (!) 101 99 (!) 124  Resp: (!) 25 (!) 23 (!) 23 (!) 22   Temp: 99.4 F (37.4 C)     TempSrc: Axillary     SpO2: 100% 100% 100% 97%  Weight:      Height:        Intake/Output Summary (Last 24 hours) at 02/04/2023 1343 Last data filed at 02/04/2023 1000 Gross per 24 hour  Intake 1830.83 ml  Output 730 ml  Net 1100.83 ml      02/03/2023    5:00 AM 02/02/2023    2:00 AM 02/02/2023    1:48 AM  Last 3 Weights  Weight (lbs) 151 lb 7.3 oz 143 lb 8.3 oz 144 lb 6.4 oz  Weight (kg) 68.7 kg 65.1 kg 65.5 kg      Telemetry    SR with frequent PVCs - Personally Reviewed  ECG    SR with nonspecific st pattern - Personally Reviewed  Physical Exam   GEN: intubated and sedated, on ventilator NECK: No JVD CARDIAC: regular rhythm, normal S1 and S2, no rubs or gallops. No murmur. VASCULAR: Radial pulses 2+ bilaterally.  RESPIRATORY:  Clear to auscultation without rales, wheezing or rhonchi  ABDOMEN: Soft, non-tender, non-distended MUSCULOSKELETAL:  normal bulk SKIN: Warm and dry, no edema NEUROLOGIC/PSYCHIATRIC: intubated and sedated   Labs    High Sensitivity Troponin:   Recent Labs  Lab 02/02/23 0219 02/02/23 0407  TROPONINIHS 477* 434*     Chemistry Recent Labs  Lab 02/03/23 0238 02/03/23 1337 02/03/23 1919 02/04/23 0741  NA 137 137  --  139  K 2.8* 4.2  --  2.3*  CL 105 107  --  109  CO2 21* 22  --  20*  GLUCOSE 118* 108*  --  124*  BUN 9 14  --  11  CREATININE 0.90 0.86  --  0.70  CALCIUM 7.3* 7.2*  --  7.3*  MG 1.9  --  1.8 1.7  PROT 4.9*  --   --   --   ALBUMIN 1.9*  --   --   --   AST 43*  --   --   --   ALT 30  --   --   --   ALKPHOS 87  --   --   --   BILITOT 0.3  --   --   --   GFRNONAA >60 >60  --  >60  ANIONGAP 11 8  --  10    Lipids  Recent Labs  Lab 02/03/23 0238  TRIG 110    Hematology Recent Labs  Lab 02/02/23 0219 02/02/23 0917 02/03/23 0238 02/04/23 0741  WBC 14.1*  --  8.6 9.5  RBC 3.95*  --  3.95* 4.12*  HGB 10.5* 11.2* 10.3* 10.5*  HCT 30.7* 33.0* 30.8* 31.6*  MCV 77.7*  --  78.0*  76.7*  MCH 26.6  --  26.1 25.5*  MCHC 34.2  --  33.4 33.2  RDW 18.5*  --  18.9* 18.8*  PLT 217  --  199 246   Thyroid No results for input(s): "TSH", "FREET4" in the last 168 hours.  BNP Recent Labs  Lab 02/02/23 0219  BNP 1,346.9*    DDimer No results for input(s): "DDIMER" in the last 168 hours.   Radiology    MR BRAIN WO CONTRAST  Result Date: 02/03/2023 CLINICAL DATA:  Altered mental status, nontraumatic EXAM: MRI HEAD WITHOUT CONTRAST TECHNIQUE: Multiplanar, multiecho pulse sequences of the brain and surrounding structures were obtained without intravenous contrast. COMPARISON:  No prior MRI available, correlation is made with CT head 02/02/2023 FINDINGS: Brain: Restricted diffusion with ADC correlate most focally right occipital lobe (series 5, images 72-78), as well as in the left inferior left cerebellum (series 5, images 60 and 61), consistent with acute infarcts. Additional foci of restricted diffusion with definite ADC correlates are seen in the bilateral caudate heads (series 5, image 77) and anterior right frontal lobe (series 5, image 96). Foci in the right frontal lobe adjacent to the remote infarct (series 5, images 90 and 91) may be related to susceptibility. The area in the right occipital lobe is associated with increased T2 hyperintense signal. No acute hemorrhage, mass, or mass effect. No extra-axial collection. Redemonstrated extensive encephalomalacia in the right MCA territory, with ex vacuo dilatation of the bilateral lateral ventricles. Approximately 7 mm of left-to-right midline shift, which appears similar to the 07/26/2022 CT head. Hemosiderin deposition is associated with the right MCA territory infarct, consistent with remote petechial hemorrhage. Vascular: Normal arterial flow voids. Skull and upper cervical spine: Normal marrow signal. Sinuses/Orbits: Clear paranasal sinuses. No acute finding in the orbits. Other: Trace fluid in right mastoid air cells. IMPRESSION:  Acute infarcts in the right occipital lobe, left inferior cerebellum, bilateral caudate heads, and anterior right frontal lobe. No evidence of acute hemorrhage. These results will be called to the ordering clinician or representative by the Radiologist Assistant, and communication documented in the PACS or [employer]. Electronically Signed   By: Elaina Pattee.D.  On: 02/03/2023 20:04   ECHOCARDIOGRAM COMPLETE  Result Date: 02/02/2023    ECHOCARDIOGRAM REPORT   Patient Name:   RUDIS Figeroa Date of Exam: 02/02/2023 Medical Rec #:  [id number]       Height:       72.0 in Accession #:    [id number]      Weight:       143.5 lb Date of Birth:  October 29, 1970      BSA:          1.850 m Patient Age:    51 years        BP:           172/82 mmHg Patient Gender: M               HR:           88 bpm. Exam Location:  Inpatient Procedure: 2D Echo, Color Doppler and Cardiac Doppler Indications:    Cardiac Arrest  History:        Patient has no prior history of Echocardiogram examinations.                 Arrythmias:Cardiac Arrest. ETOH, Cocaine, and Polysubstance                 Abuse.  Sonographer:    Milbert Coulter Referring Phys: [id number] Aliene Beams  Sonographer Comments: No parasternal window, echo performed with patient supine and on artificial respirator and no subcostal window. [employer], contracted left arm. and Image acquisition challenging due to respiratory motion. IMPRESSIONS  1. Left ventricular ejection fraction, by estimation, is 40%. The left ventricle has mildly decreased function. Left ventricular endocardial border not optimally defined to fully evaluate regional wall motion. Septal hypokinesis worse that other global findings.There is mild concentric left ventricular hypertrophy. Left ventricular diastolic parameters are indeterminate.  2. Right ventricular systolic function is normal. The right ventricular size is normal.  3. The mitral valve is abnormal. Mild mitral valve regurgitation. No  evidence of mitral stenosis.  4. The aortic valve has been repaired/replaced. Aortic valve regurgitation is not visualized. Aortic valve mean gradient measures 13.0 mmHg.  5. Aortic root/ascending aorta has been repaired/replaced. Comparison(s): No prior Echocardiogram. Technically difficult study. FINDINGS  Left Ventricle: Left ventricular ejection fraction, by estimation, is 40%. The left ventricle has mildly decreased function. Left ventricular endocardial border not optimally defined to evaluate regional wall motion. The left ventricular internal cavity  size was normal in size. There is mild concentric left ventricular hypertrophy. Left ventricular diastolic parameters are indeterminate.  LV Wall Scoring: The anterior septum, mid inferoseptal segment, and basal inferoseptal segment are hypokinetic. Right Ventricle: The right ventricular size is normal. No increase in right ventricular wall thickness. Right ventricular systolic function is normal. Left Atrium: Left atrial size was normal in size. Right Atrium: Right atrial size was normal in size. Pericardium: There is no evidence of pericardial effusion. Mitral Valve: The mitral valve is abnormal. Mild mitral valve regurgitation. No evidence of mitral valve stenosis. Tricuspid Valve: The tricuspid valve is normal in structure. Tricuspid valve regurgitation is trivial. No evidence of tricuspid stenosis. Aortic Valve: The aortic valve has been repaired/replaced. Aortic valve regurgitation is not visualized. Aortic valve mean gradient measures 13.0 mmHg. Aortic valve peak gradient measures 24.6 mmHg. Aortic valve area, by VTI measures 1.21 cm. Pulmonic Valve: The pulmonic valve was not well visualized. Pulmonic valve regurgitation is not visualized. Aorta: The aortic root/ascending aorta has been repaired/replaced. IAS/Shunts: The interatrial septum  was not well visualized.  LEFT VENTRICLE PLAX 2D LVIDd:         4.60 cm      Diastology LVIDs:         4.20 cm       LV e' medial:    7.51 cm/s LV PW:         1.60 cm      LV E/e' medial:  6.9 LV IVS:        1.60 cm      LV e' lateral:   10.40 cm/s LVOT diam:     1.80 cm      LV E/e' lateral: 5.0 LV SV:         55 LV SV Index:   30 LVOT Area:     2.54 cm  LV Volumes (MOD) LV vol d, MOD A2C: 98.9 ml LV vol d, MOD A4C: 101.0 ml LV vol s, MOD A2C: 74.0 ml LV vol s, MOD A4C: 45.9 ml LV SV MOD A2C:     24.9 ml LV SV MOD A4C:     101.0 ml LV SV MOD BP:      42.4 ml RIGHT VENTRICLE RV Basal diam:  3.00 cm RV Mid diam:    2.40 cm RV S prime:     7.18 cm/s TAPSE (M-mode): 1.6 cm LEFT ATRIUM             Index        RIGHT ATRIUM           Index LA diam:        3.20 cm 1.73 cm/m   RA Area:     16.00 cm LA Vol (A2C):   65.5 ml 35.40 ml/m  RA Volume:   37.80 ml  20.43 ml/m LA Vol (A4C):   52.9 ml 28.59 ml/m LA Biplane Vol: 57.8 ml 31.24 ml/m  AORTIC VALVE AV Area (Vmax):    1.29 cm AV Area (Vmean):   1.24 cm AV Area (VTI):     1.21 cm AV Vmax:           248.00 cm/s AV Vmean:          168.000 cm/s AV VTI:            0.454 m AV Peak Grad:      24.6 mmHg AV Mean Grad:      13.0 mmHg LVOT Vmax:         126.00 cm/s LVOT Vmean:        81.800 cm/s LVOT VTI:          0.215 m LVOT/AV VTI ratio: 0.47 MITRAL VALVE               TRICUSPID VALVE MV Area (PHT): 3.60 cm    TR Peak grad:   30.2 mmHg MV Decel Time: 211 msec    TR Vmax:        275.00 cm/s MV E velocity: 51.90 cm/s MV A velocity: 52.90 cm/s  SHUNTS MV E/A ratio:  0.98        Systemic VTI:  0.22 m                            Systemic Diam: 1.80 cm Riley Lam MD Electronically signed by Riley Lam MD Signature Date/Time: 02/02/2023/3:00:04 PM    Final     Cardiac Studies   Cardiac Studies & Procedures       ECHOCARDIOGRAM  ECHOCARDIOGRAM COMPLETE 02/02/2023  Narrative ECHOCARDIOGRAM REPORT    Patient Name:   JAZARION MCMANNIS Date of Exam: 02/02/2023 Medical Rec #:  [id number]       Height:       72.0 in Accession #:    [id number]      Weight:       143.5  lb Date of Birth:  1971/05/12      BSA:          1.850 m Patient Age:    51 years        BP:           172/82 mmHg Patient Gender: M               HR:           88 bpm. Exam Location:  Inpatient  Procedure: 2D Echo, Color Doppler and Cardiac Doppler  Indications:    Cardiac Arrest  History:        Patient has no prior history of Echocardiogram examinations. Arrythmias:Cardiac Arrest. ETOH, Cocaine, and Polysubstance Abuse.  Sonographer:    Milbert Coulter Referring Phys: [id number] Aliene Beams   Sonographer Comments: No parasternal window, echo performed with patient supine and on artificial respirator and no subcostal window. [employer], contracted left arm. and Image acquisition challenging due to respiratory motion. IMPRESSIONS   1. Left ventricular ejection fraction, by estimation, is 40%. The left ventricle has mildly decreased function. Left ventricular endocardial border not optimally defined to fully evaluate regional wall motion. Septal hypokinesis worse that other global findings.There is mild concentric left ventricular hypertrophy. Left ventricular diastolic parameters are indeterminate. 2. Right ventricular systolic function is normal. The right ventricular size is normal. 3. The mitral valve is abnormal. Mild mitral valve regurgitation. No evidence of mitral stenosis. 4. The aortic valve has been repaired/replaced. Aortic valve regurgitation is not visualized. Aortic valve mean gradient measures 13.0 mmHg. 5. Aortic root/ascending aorta has been repaired/replaced.  Comparison(s): No prior Echocardiogram. Technically difficult study.  FINDINGS Left Ventricle: Left ventricular ejection fraction, by estimation, is 40%. The left ventricle has mildly decreased function. Left ventricular endocardial border not optimally defined to evaluate regional wall motion. The left ventricular internal cavity size was normal in size. There is mild concentric left ventricular hypertrophy.  Left ventricular diastolic parameters are indeterminate.   LV Wall Scoring: The anterior septum, mid inferoseptal segment, and basal inferoseptal segment are hypokinetic.  Right Ventricle: The right ventricular size is normal. No increase in right ventricular wall thickness. Right ventricular systolic function is normal.  Left Atrium: Left atrial size was normal in size.  Right Atrium: Right atrial size was normal in size.  Pericardium: There is no evidence of pericardial effusion.  Mitral Valve: The mitral valve is abnormal. Mild mitral valve regurgitation. No evidence of mitral valve stenosis.  Tricuspid Valve: The tricuspid valve is normal in structure. Tricuspid valve regurgitation is trivial. No evidence of tricuspid stenosis.  Aortic Valve: The aortic valve has been repaired/replaced. Aortic valve regurgitation is not visualized. Aortic valve mean gradient measures 13.0 mmHg. Aortic valve peak gradient measures 24.6 mmHg. Aortic valve area, by VTI measures 1.21 cm.  Pulmonic Valve: The pulmonic valve was not well visualized. Pulmonic valve regurgitation is not visualized.  Aorta: The aortic root/ascending aorta has been repaired/replaced.  IAS/Shunts: The interatrial septum was not well visualized.   LEFT VENTRICLE PLAX 2D LVIDd:         4.60 cm      Diastology LVIDs:  4.20 cm      LV e' medial:    7.51 cm/s LV PW:         1.60 cm      LV E/e' medial:  6.9 LV IVS:        1.60 cm      LV e' lateral:   10.40 cm/s LVOT diam:     1.80 cm      LV E/e' lateral: 5.0 LV SV:         55 LV SV Index:   30 LVOT Area:     2.54 cm  LV Volumes (MOD) LV vol d, MOD A2C: 98.9 ml LV vol d, MOD A4C: 101.0 ml LV vol s, MOD A2C: 74.0 ml LV vol s, MOD A4C: 45.9 ml LV SV MOD A2C:     24.9 ml LV SV MOD A4C:     101.0 ml LV SV MOD BP:      42.4 ml  RIGHT VENTRICLE RV Basal diam:  3.00 cm RV Mid diam:    2.40 cm RV S prime:     7.18 cm/s TAPSE (M-mode): 1.6 cm  LEFT ATRIUM              Index        RIGHT ATRIUM           Index LA diam:        3.20 cm 1.73 cm/m   RA Area:     16.00 cm LA Vol (A2C):   65.5 ml 35.40 ml/m  RA Volume:   37.80 ml  20.43 ml/m LA Vol (A4C):   52.9 ml 28.59 ml/m LA Biplane Vol: 57.8 ml 31.24 ml/m AORTIC VALVE AV Area (Vmax):    1.29 cm AV Area (Vmean):   1.24 cm AV Area (VTI):     1.21 cm AV Vmax:           248.00 cm/s AV Vmean:          168.000 cm/s AV VTI:            0.454 m AV Peak Grad:      24.6 mmHg AV Mean Grad:      13.0 mmHg LVOT Vmax:         126.00 cm/s LVOT Vmean:        81.800 cm/s LVOT VTI:          0.215 m LVOT/AV VTI ratio: 0.47  MITRAL VALVE               TRICUSPID VALVE MV Area (PHT): 3.60 cm    TR Peak grad:   30.2 mmHg MV Decel Time: 211 msec    TR Vmax:        275.00 cm/s MV E velocity: 51.90 cm/s MV A velocity: 52.90 cm/s  SHUNTS MV E/A ratio:  0.98        Systemic VTI:  0.22 m Systemic Diam: 1.80 cm  Riley Lam MD Electronically signed by Riley Lam MD Signature Date/Time: 02/02/2023/3:00:04 PM    Final              Patient Profile     52 y.o. 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 Merrily Pew.   Assessment & Plan    Cardiac arrest  Ventricular Fibrillation Severe  hypokalemia and hypocalcemia Blood loss anemia requiring transfusion Elevated troponin -chest pain was not main symptom, but with newly reduced EF and VF arrest, can consider cath prior to discharge based on clinical trajectory -suspect electrolyte abnormalities may have driven VT/VF -electrolytes being repleted -continue amiodarone for now  Multifocal acute infarcts on MRI -concern for cardioembolic etiology. Unfortunately, there is little we can do to manage this given his current  condition. With concern for GI bleed, he is high risk for anticoagulation/antiplatelets. Venous dopplers have been ordered. Blood cultures have been negative thus far, but with AVR would need to consider TEE. However, TEE would not change management at this time and is high risk given his current critical condition.  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 One would be for cardiac cath and potentially TEE when clinically improved to further define etiology of cardiac arrest/VF and punctate strokes. Second pathway would be palliative care if he is not making significant clinical improvements. Defer to primary team and family regarding these options.  For now, cardiology will follow peripherally. Please reach out to [place] with questions or concerns.  For questions or updates, please contact [facility] Please consult [email or web address] for contact info under     Signed, Jodelle Red, MD  02/04/2023, 1:43 PM

## 2023-02-05 ENCOUNTER — Inpatient Hospital Stay (HOSPITAL_COMMUNITY): Payer: PPO

## 2023-02-05 DIAGNOSIS — J9600 Acute respiratory failure, unspecified whether with hypoxia or hypercapnia: Secondary | ICD-10-CM | POA: Diagnosis not present

## 2023-02-05 DIAGNOSIS — K922 Gastrointestinal hemorrhage, unspecified: Secondary | ICD-10-CM | POA: Diagnosis not present

## 2023-02-05 LAB — CULTURE, BLOOD (ROUTINE X 2): Special Requests: ADEQUATE

## 2023-02-05 LAB — GLUCOSE, CAPILLARY
Glucose-Capillary: 83 mg/dL (ref 70–99)
Glucose-Capillary: 102 mg/dL — ABNORMAL HIGH (ref 70–99)
Glucose-Capillary: 106 mg/dL — ABNORMAL HIGH (ref 70–99)
Glucose-Capillary: 118 mg/dL — ABNORMAL HIGH (ref 70–99)
Glucose-Capillary: 116 mg/dL — ABNORMAL HIGH (ref 70–99)
Glucose-Capillary: 120 mg/dL — ABNORMAL HIGH (ref 70–99)

## 2023-02-05 LAB — CULTURE, RESPIRATORY W GRAM STAIN

## 2023-02-05 LAB — BASIC METABOLIC PANEL
Anion gap: 9 (ref 5–15)
Anion gap: 6 (ref 5–15)
BUN: 8 mg/dL (ref 6–20)
BUN: 8 mg/dL (ref 6–20)
CO2: 23 mmol/L (ref 22–32)
CO2: 22 mmol/L (ref 22–32)
Calcium: 7.4 mg/dL — ABNORMAL LOW (ref 8.9–10.3)
Calcium: 7.2 mg/dL — ABNORMAL LOW (ref 8.9–10.3)
Chloride: 108 mmol/L (ref 98–111)
Chloride: 110 mmol/L (ref 98–111)
Creatinine, Ser: 0.61 mg/dL (ref 0.61–1.24)
Creatinine, Ser: 0.58 mg/dL — ABNORMAL LOW (ref 0.61–1.24)
GFR, Estimated: >60 mL/min (ref >60)
GFR, Estimated: >60 mL/min (ref >60)
Glucose, Bld: 96 mg/dL (ref 70–99)
Glucose, Bld: 121 mg/dL — ABNORMAL HIGH (ref 70–99)
Potassium: 2.9 mmol/L — ABNORMAL LOW (ref 3.5–5.1)
Potassium: 3.5 mmol/L (ref 3.5–5.1)
Sodium: 140 mmol/L (ref 135–145)
Sodium: 138 mmol/L (ref 135–145)

## 2023-02-05 LAB — CBC
HCT: 32.4 % — ABNORMAL LOW (ref 39.0–52.0)
Hemoglobin: 10.5 g/dL — ABNORMAL LOW (ref 13.0–17.0)
MCH: 25.4 pg — ABNORMAL LOW (ref 26.0–34.0)
MCHC: 32.4 g/dL (ref 30.0–36.0)
MCV: 78.3 fL — ABNORMAL LOW (ref 80.0–100.0)
Platelets: 257 10*3/uL (ref 150–400)
RBC: 4.14 MIL/uL — ABNORMAL LOW (ref 4.22–5.81)
RDW: 19.3 % — ABNORMAL HIGH (ref 11.5–15.5)
WBC: 9.9 10*3/uL (ref 4.0–10.5)
nRBC: 0 % (ref 0.0–0.2)

## 2023-02-05 LAB — HEPARIN LEVEL (UNFRACTIONATED): Heparin Unfractionated: 0.16 IU/mL — ABNORMAL LOW (ref 0.30–0.70)

## 2023-02-05 LAB — LIPID PANEL
Cholesterol: 54 mg/dL (ref 0–200)
HDL: 20 mg/dL — ABNORMAL LOW (ref >40)
LDL Cholesterol: 23 mg/dL (ref 0–99)
Total CHOL/HDL Ratio: 2.7 RATIO
Triglycerides: 54 mg/dL (ref <150)
VLDL: 11 mg/dL (ref 0–40)

## 2023-02-05 LAB — PHOSPHORUS: Phosphorus: 2 mg/dL — ABNORMAL LOW (ref 2.5–4.6)

## 2023-02-05 LAB — MAGNESIUM: Magnesium: 1.7 mg/dL (ref 1.7–2.4)

## 2023-02-05 MED ORDER — HEPARIN (PORCINE) 25000 UT/250ML-% IV SOLN
1200.0000 [IU]/h | INTRAVENOUS | Status: DC
  Administered 2023-02-05: 1000 [IU]/h via INTRAVENOUS
  Administered 2023-02-06: 1100 [IU]/h via INTRAVENOUS
  Filled 2023-02-05 (×2): qty 250

## 2023-02-05 MED ORDER — MAGNESIUM SULFATE 2 GM/50ML IV SOLN
2.0000 g | Freq: Once | INTRAVENOUS | Status: AC
  Administered 2023-02-05: 2 g via INTRAVENOUS
  Filled 2023-02-05: qty 50

## 2023-02-05 MED ORDER — POTASSIUM CHLORIDE 20 MEQ PO PACK
40.0000 meq | PACK | Freq: Once | ORAL | Status: AC
  Administered 2023-02-05: 40 meq
  Filled 2023-02-05: qty 2

## 2023-02-05 MED ORDER — GERHARDT'S BUTT CREAM
TOPICAL_CREAM | Freq: Two times a day (BID) | CUTANEOUS | Status: DC
  Administered 2023-02-05: 1 via TOPICAL
  Filled 2023-02-05: qty 1

## 2023-02-05 MED ORDER — PROPOFOL 1000 MG/100ML IV EMUL
5.0000 ug/kg/min | INTRAVENOUS | Status: DC
  Filled 2023-02-05 (×2): qty 100

## 2023-02-05 MED ORDER — POTASSIUM CHLORIDE 20 MEQ PO PACK
40.0000 meq | PACK | Freq: Once | ORAL | Status: AC
  Administered 2023-02-05: 40 meq via ORAL
  Filled 2023-02-05: qty 2

## 2023-02-05 NOTE — Progress Notes (Signed)
ANTICOAGULATION CONSULT NOTE - Initial Consult  Pharmacy Consult for Heparin Indication: DVT  No Known Allergies  Patient Measurements: Height: 6' 0.01" (182.9 cm) Weight: 66.3 kg (146 lb 2.6 oz) IBW/kg (Calculated) : 77.62 Heparin Dosing Weight: 66 kg  Vital Signs: Temp: 100 F (37.8 C) (05/18 1920) Temp Source: Oral (05/18 1920) BP: 100/66 (05/18 1900) Pulse Rate: 76 (05/18 1900)  Labs: Recent Labs    02/03/23 0238 02/03/23 1337 02/04/23 0741 02/04/23 1812 02/05/23 0645 02/05/23 1603 02/05/23 1957  HGB 10.3*  --  10.5*  --  10.5*  --   --   HCT 30.8*  --  31.6*  --  32.4*  --   --   PLT 199  --  246  --  257  --   --   HEPARINUNFRC  --   --   --   --   --   --  0.16*  CREATININE 0.90   < > 0.70 0.75 0.61 0.58*  --    < > = values in this interval not displayed.    Estimated Creatinine Clearance: 100.1 mL/min (A) (by C-G formula based on SCr of 0.58 mg/dL (L)).   Medical History: Past Medical History:  Diagnosis Date   ADHD, adult residual type    Anxiety and depression    Bipolar disorder (HCC)    Depression    Gastroesophageal reflux disease    History of stroke 2018   Reportedly in the setting of substance abuse   Knee pain    Polysubstance abuse (HCC)    Stroke [facility])     Assessment: 52 years of age male admitted from [facility] ED with melena and then Vfib arrested. Transferred to [facility]. Found to have age-indeterminate right popliteal DVT and scattered BL acute CVA. Note patient with hx of aortic dissection s/p valve and arch replacement. Vascular consulted with no plan for surgery. Pharmacy consulted for IV Heparin drip- no bolus and low goal of 0.3 to 0.5 in setting of recent melena.    No further melena per RN. Neurology ok with anticoagulation. Heparin level 0.16 is subtherapeutic on 1000 units/hr. Per RN, no issues with infusion or bleeding other than small amount of seeping from penis that is not new.   Goal of Therapy:  Heparin level 0.3 to  0.5 units/ml Monitor platelets by anticoagulation protocol: Yes   Plan:   No bolus due to recent bleed and recent Enoxaparin.  Increase heparin to 1100 units/hr  Monitor daily heparin level, CBC Monitor for signs/symptoms of bleeding    Alphia Moh, PharmD, BCPS, BCCP Clinical Pharmacist  Please check AMION for all [facility] Pharmacy phone numbers After 10:00 PM, call Main Pharmacy [phone]

## 2023-02-05 NOTE — Progress Notes (Signed)
STROKE TEAM PROGRESS NOTE   INTERVAL HISTORY  Discussed with ICU nurse.  No family at bedside.  Essentially unchanged from yesterday.  Still with no response and no improvement.  He was made DNR yesterday by family.  There is still considering comfort care measures.  Vitals:   02/05/23 0900 02/05/23 0918 02/05/23 0930 02/05/23 1000  BP: 134/82 134/84 139/81 110/72  Pulse: 92  93 83  Resp: 20  (!) 21 18  Temp:      TempSrc:      SpO2: 100%  100% 97%  Weight:      Height:       CBC:  Recent Labs  Lab 02/04/23 0741 02/05/23 0645  WBC 9.5 9.9  HGB 10.5* 10.5*  HCT 31.6* 32.4*  MCV 76.7* 78.3*  PLT 246 257    Basic Metabolic Panel:  Recent Labs  Lab 02/04/23 1812 02/05/23 0645  NA 137 140  K 3.1* 2.9*  CL 107 108  CO2 20* 23  GLUCOSE 179* 96  BUN 11 8  CREATININE 0.75 0.61  CALCIUM 7.2* 7.4*  MG 1.7 1.7  PHOS 3.0 2.0*    Lipid Panel:  Recent Labs  Lab 02/05/23 0645  CHOL 54  TRIG 54  HDL 20*  CHOLHDL 2.7  VLDL 11  LDLCALC 23    HgbA1c:  Recent Labs  Lab 02/02/23 0219  HGBA1C 5.1    Urine Drug Screen:  Recent Labs  Lab 02/02/23 0227  LABOPIA NONE DETECTED  COCAINSCRNUR NONE DETECTED  LABBENZ NONE DETECTED  AMPHETMU NONE DETECTED  THCU NONE DETECTED  LABBARB NONE DETECTED     Alcohol Level  Recent Labs  Lab 02/02/23 1036  ETH <10     IMAGING past 24 hours VAS [place] LOWER EXTREMITY ARTERIAL DUPLEX  Result Date: 02/05/2023 LOWER EXTREMITY ARTERIAL DUPLEX STUDY Patient Name:  Daniel Arroyo  Date of Exam:   02/04/2023 Medical Rec #: [id number]        Accession #:    [id number] Date of Birth: [birth year]       Patient Gender: M Patient Age:   52 years Exam Location:  [facility] Procedure:      VAS [place] LOWER EXTREMITY ARTERIAL DUPLEX Referring Phys: Renae Fickle HOFFMAN --------------------------------------------------------------------------------  Indications: Incidental right popliteal artery occlusion noted on DVT study.              Cool  right lower extremity with monophasic pulses. Other Factors: Embolic stroke, polysubstance abuse.  Current ABI: N/A Comparison Study: No prior studies. Performing Technologist: Jean Rosenthal RDMS, RVT  Examination Guidelines: A complete evaluation includes B-mode imaging, spectral Doppler, color Doppler, and power Doppler as needed of all accessible portions of each vessel. Bilateral testing is considered an integral part of a complete examination. Limited examinations for reoccurring indications may be performed as noted.  +-----------+--------+-----+--------+----------+----------------------------+ RIGHT      PSV cm/sRatioStenosisWaveform  Comments                     +-----------+--------+-----+--------+----------+----------------------------+ CFA Prox   31                   triphasic                              +-----------+--------+-----+--------+----------+----------------------------+ CFA Mid    29                   triphasic                              +-----------+--------+-----+--------+----------+----------------------------+  CFA Distal 28                   triphasic                              +-----------+--------+-----+--------+----------+----------------------------+ DFA        25                   triphasic                              +-----------+--------+-----+--------+----------+----------------------------+ SFA Prox   49                   triphasic                              +-----------+--------+-----+--------+----------+----------------------------+ SFA Mid    46                   triphasic                              +-----------+--------+-----+--------+----------+----------------------------+ SFA Distal 29                   triphasic                              +-----------+--------+-----+--------+----------+----------------------------+ POP Prox   19                   triphasic                               +-----------+--------+-----+--------+----------+----------------------------+ POP Mid    8                    monophasicPre-occlusive                +-----------+--------+-----+--------+----------+----------------------------+ POP Distal              occluded          Appearance of acute thrombus +-----------+--------+-----+--------+----------+----------------------------+ TP Trunk                occluded                                       +-----------+--------+-----+--------+----------+----------------------------+ ATA Prox   14                   monophasic                             +-----------+--------+-----+--------+----------+----------------------------+ ATA Mid    13                   monophasic                             +-----------+--------+-----+--------+----------+----------------------------+ ATA Distal 11                   monophasic                             +-----------+--------+-----+--------+----------+----------------------------+  PTA Prox   15                   monophasicCollateral                   +-----------+--------+-----+--------+----------+----------------------------+ PTA Mid    23                   monophasic                             +-----------+--------+-----+--------+----------+----------------------------+ PTA Distal 23                   monophasic                             +-----------+--------+-----+--------+----------+----------------------------+ PERO Prox               occluded                                       +-----------+--------+-----+--------+----------+----------------------------+ PERO Mid   17                   monophasic                             +-----------+--------+-----+--------+----------+----------------------------+ PERO Distal12                   monophasic                             +-----------+--------+-----+--------+----------+----------------------------+  DP         14                   monophasic                             +-----------+--------+-----+--------+----------+----------------------------+   +-----------+--------+-----+--------+---------+------------------+ LEFT       PSV cm/sRatioStenosisWaveform Comments           +-----------+--------+-----+--------+---------+------------------+ CFA Prox   55                   triphasic                   +-----------+--------+-----+--------+---------+------------------+ CFA Mid    43                   triphasic                   +-----------+--------+-----+--------+---------+------------------+ CFA Distal 45                   triphasic                   +-----------+--------+-----+--------+---------+------------------+ DFA        37                   triphasic                   +-----------+--------+-----+--------+---------+------------------+ SFA Prox   45                   triphasic                   +-----------+--------+-----+--------+---------+------------------+  SFA Mid    46                   triphasic                   +-----------+--------+-----+--------+---------+------------------+ SFA Distal 32                   biphasic                    +-----------+--------+-----+--------+---------+------------------+ POP Prox   37                   biphasic                    +-----------+--------+-----+--------+---------+------------------+ POP Mid    36                   triphasic                   +-----------+--------+-----+--------+---------+------------------+ POP Distal 68                   triphasicIntimal thickening +-----------+--------+-----+--------+---------+------------------+ TP Trunk   49                   triphasic                   +-----------+--------+-----+--------+---------+------------------+ ATA Prox   85                   triphasic                    +-----------+--------+-----+--------+---------+------------------+ ATA Mid    73                   triphasic                   +-----------+--------+-----+--------+---------+------------------+ ATA Distal 56                   triphasic                   +-----------+--------+-----+--------+---------+------------------+ PTA Prox   77                   triphasic                   +-----------+--------+-----+--------+---------+------------------+ PTA Mid    94                   triphasic                   +-----------+--------+-----+--------+---------+------------------+ PTA Distal 59                   triphasic                   +-----------+--------+-----+--------+---------+------------------+ PERO Prox  42                   triphasic                   +-----------+--------+-----+--------+---------+------------------+ PERO Mid   31                   biphasic                    +-----------+--------+-----+--------+---------+------------------+ PERO Distal29  triphasic                   +-----------+--------+-----+--------+---------+------------------+ DP         47                   triphasic                   +-----------+--------+-----+--------+---------+------------------+  Summary: Right: Total occlusion noted in the mid popliteal artery and extending through the TP trunk. Appearance of acute thrombus noted. Reconstitution distally via collatels with monophasic flow observed in the PTA, DPA, and Pero A.  See table(s) above for measurements and observations. Electronically signed by Gerarda Fraction on 02/05/2023 at 8:50:17 AM.    Final    VAS [place] LOWER EXTREMITY VENOUS (DVT)  Result Date: 02/05/2023  Lower Venous DVT Study Patient Name:  Daniel Arroyo  Date of Exam:   02/04/2023 Medical Rec #: [id number]        Accession #:    [id number] Date of Birth: 1971/06/25       Patient Gender: M Patient Age:   20 years Exam Location:  [facility] Procedure:      VAS [place] LOWER EXTREMITY VENOUS (DVT) Referring Phys: Angelique Blonder WOLFE --------------------------------------------------------------------------------  Indications: Embolic stroke.  Comparison Study: No prior studies. Performing Technologist: Jean Rosenthal RDMS, RVT  Examination Guidelines: A complete evaluation includes B-mode imaging, spectral Doppler, color Doppler, and power Doppler as needed of all accessible portions of each vessel. Bilateral testing is considered an integral part of a complete examination. Limited examinations for reoccurring indications may be performed as noted. The reflux portion of the exam is performed with the patient in reverse Trendelenburg.  +---------+---------------+---------+-----------+----------+--------------+ RIGHT    CompressibilityPhasicitySpontaneityPropertiesThrombus Aging +---------+---------------+---------+-----------+----------+--------------+ CFV      Full           Yes      Yes                                 +---------+---------------+---------+-----------+----------+--------------+ SFJ      Full                                                        +---------+---------------+---------+-----------+----------+--------------+ FV Prox  Full                                                        +---------+---------------+---------+-----------+----------+--------------+ FV Mid   Full                                                        +---------+---------------+---------+-----------+----------+--------------+ FV DistalFull                                                        +---------+---------------+---------+-----------+----------+--------------+  PFV      Full                                                        +---------+---------------+---------+-----------+----------+--------------+ POP      Full           Yes      Yes                                  +---------+---------------+---------+-----------+----------+--------------+ PTV      Full                                                        +---------+---------------+---------+-----------+----------+--------------+ PERO     Full                                                        +---------+---------------+---------+-----------+----------+--------------+   +---------+---------------+---------+-----------+----------+--------------+ LEFT     CompressibilityPhasicitySpontaneityPropertiesThrombus Aging +---------+---------------+---------+-----------+----------+--------------+ CFV      Full           Yes      Yes                                 +---------+---------------+---------+-----------+----------+--------------+ SFJ      Full                                                        +---------+---------------+---------+-----------+----------+--------------+ FV Prox  Full                                                        +---------+---------------+---------+-----------+----------+--------------+ FV Mid   Full                                                        +---------+---------------+---------+-----------+----------+--------------+ FV DistalFull                                                        +---------+---------------+---------+-----------+----------+--------------+ PFV      Full                                                        +---------+---------------+---------+-----------+----------+--------------+  POP      Full           Yes      Yes                                 +---------+---------------+---------+-----------+----------+--------------+ PTV      Full                                                        +---------+---------------+---------+-----------+----------+--------------+ PERO     Full                                                         +---------+---------------+---------+-----------+----------+--------------+     Summary: RIGHT: - There is no evidence of deep vein thrombosis in the lower extremity.  - No cystic structure found in the popliteal fossa.  - Incidental right popliteal artery occlusion noted. Full arterial duplex subsequently performed.  LEFT: - There is no evidence of deep vein thrombosis in the lower extremity.  - No cystic structure found in the popliteal fossa.  *See table(s) above for measurements and observations. Electronically signed by Gerarda Fraction on 02/05/2023 at 8:49:57 AM.    Final    DG Chest Port 1 View  Result Date: 02/05/2023 CLINICAL DATA:  Acute respiratory failure EXAM: PORTABLE CHEST 1 VIEW COMPARISON:  02/02/2023 FINDINGS: The patient's head obscures the right upper lung zone. Right lung base and left lung are clear. Endotracheal tube is seen 3.4 cm above the carina. Nasoenteric feeding tube extends into the gastric lumen. No definite pneumothorax. No pleural effusion. Aortic valve replacement has been performed. Thoracic aortic aneurysm again noted. Pulmonary vascularity is normal. No acute bone abnormality. IMPRESSION: 1. Endotracheal tube 3.4 cm above the carina. 2. Nasoenteric feeding tube extends into the gastric lumen. 3. Grossly stable appearance of thoracic aortic aneurysm. Electronically Signed   By: Helyn Numbers M.D.   On: 02/05/2023 02:30    PHYSICAL EXAM  Temp:  [98.7 F (37.1 C)-100 F (37.8 C)] 99.8 F (37.7 C) (05/18 0718) Pulse Rate:  [81-124] 83 (05/18 1000) Resp:  [13-25] 18 (05/18 1000) BP: (109-167)/(66-122) 110/72 (05/18 1000) SpO2:  [94 %-100 %] 97 % (05/18 1000) FiO2 (%):  [40 %-50 %] 50 % (05/18 0748) Weight:  [66.3 kg] 66.3 kg (05/18 0414)   General: Intubated and sedated.  Appears to be critically ill. CV: Regular rate and rhythm Neuroexam: Not following commands nonverbal.  Tends to keep eyes closed.  Head tilted to the right.  I had to force his eyes open pupils  appear equal round reactive.  Bilateral corneals are intact.  No blink to threat bilaterally.  Does not track.  Eyes are midline at baseline.  Weak oculocephalics present. Left upper and lower extremity severe contractures from previous stroke.  No movement to noxious stimuli in the right upper or lower extremity.  ASSESSMENT/PLAN Daniel Arroyo is a 52 y.o. 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 While in the ED at [facility], he had a brief episode of V-fib arrest with ROSC achieved after 5 minutes of CPR.   Stroke: Scattered bilateral acute ischemic infarcts, cardioembolic pattern, etiology unclear, may from NSTEMI, endocarditis, recurrent aortic dissection CT head No acute abnormality.  Chronic right MCA infarct CTA head & neck once pt more stabilized and family desire aggressive care MRI  acute infarcts in right occipital lobe, left inferior cerebellum, bilateral caudate heads and anterior right frontal lobe US venous BLE no DVT 2D Echo EF 40%.  Left ventricle mildly decreased function, septal hypokinesis, concentric left ventricular hypertrophy May consider TEE if pt improves with family desires aggressive care LDL 23 HgbA1c 5.1 UDS neg  VTE prophylaxis - SCD's/Lovenox  No antithrombotics prior to admission, now on no antithrombotics.  Due to concern for GI bleed Therapy recommendations: Pending Disposition: Pending, per RN, family would like to watch for several days, if not improving, will lean toward comfort care  Hypertension Aortic dissection AVR  HFrEF  s/p aortic arch and valve replacement EF 40%, concentric left ventricular  hypertrophy BP Stable Long-term BP goal normotensive Consider CTA aorta to evaluate aortic dissection once stable and family desire aggressive care  Possible STEMI Episode of V-fib arrest S/p CPR and ROSC achieved after 5 minutes  Cardiology on board Could be due to severe hypokalemia with K < 2.0 On amiodarone  PAD Right arterial US - 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 Appearance of acute thrombus noted. Reconstitution  distally via collatels with monophasic flow observed in the PTA, DPA, and  Pero A.  Consider CTA aorta to evaluate aortic dissection once stable and family desire aggressive care  Hyperlipidemia Home meds: None LDL pending goal < 70 on atorvastatin 40 mg Continue statin at discharge  Acute hypoxic respiratory failure Concern for aspiration pneumonia Intubated  On Zosyn Vent management per CCM  Sepsis Colitis with reported melena  UTI On Zosyn On Protonix twice daily Per CCM  Dysphagia OG tube Nutrition consult for tube feeds Feeds infusing Speech therapy when able  Other Stroke Risk Factors ETOH use, alcohol level <10, advised to drink no more than 2 drink(s) a day History of polysubstance abuse Hx stroke/TIA - 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  This patient is critically ill due to respiratory distress, stroke, non-STEMI, GI bleeding, sepsis and at significant risk of neurological worsening, death form heart failure, respiratory failure, recurrent stroke, bleeding from [facility], seizure, sepsis. This patient's care  requires constant monitoring of vital signs, hemodynamics, respiratory and cardiac monitoring, review of multiple databases, neurological assessment, discussion with family, other specialists and medical decision making of high complexity. I spent 45 minutes of neurocritical care time in the care of this patient.   Jayah Balthazar,MD     To contact Stroke Continuity provider, please refer to [email or web address]. After hours, contact General Neurology

## 2023-02-05 NOTE — Progress Notes (Signed)
Pt is still having increased WOB while on Precedex. Dr. Isaiah Serge notified. Instructed to restart fentanyl drip.

## 2023-02-05 NOTE — Progress Notes (Signed)
Bedside report given to Macky Lower RN. She is taking over his nursing care at this time.

## 2023-02-05 NOTE — Progress Notes (Addendum)
NAME:  Daniel Arroyo, MRN:  [id number], DOB:  1971/09/09, LOS: 3 ADMISSION DATE:  02/02/2023, CONSULTATION DATE:  02/05/23 REFERRING MD:  EDP, CHIEF COMPLAINT:  cardiac arrest   History of Present Illness:   Daniel Arroyo is a 52 y.o. M with PMH significant for polysubstance abuse and CVA with residual RUE deficits, LLE  injury for which never sought treatment for which never sought treatment and subsequent foot contracture and difficulty ambulating, Type II aortic dissection, bipolar disorder and ADHD who initially presented to [facility] [place] with three days of abdominal pain dark stools.   His potassium was markedly low at 1.6, Hgb WNL. He was treated with octreotide, pantoprazole, antibiotics, and given potassium for his hypokalemia. An abdominal CT showed colitis. He also was found to have aortic dissection which was previously known and unchanged from his previous CT. He then experienced a V-fib cardiac arrest and had CPR for 5 minutes with standard ACLS. He was intubated and briefly required pressors.  CXR with RLL infiltrate. The ED provider spoke with TCTS regarding dissection as follows "Given his history of type II aortic dissection a CT was done of his chest, now showing a type I aortic dissection. The case was discussed with cardio thoracic surgery who found in his records that he has had an aortic arch and valve replacement and therefore is not a surgical candidate. He should be treated as a type II / B dissection"  He was transported to [facility] and arrived hemodynamically stable but not responsive with sedation wean.   Per his father who is at the bedside, pt lives alone and he thinks has been smoking marijuana.  He is unsure about other drugs, does not think he has been drinking a significant amount of alcohol.  Pt is minimally able to care for himself, his father was checking on him 1-2x per day until a couple of weeks ago when pt was verbally abusive, so he stopped coming and has not  seen him recently until today   Pertinent  Medical History   has a past medical history of ADHD, adult residual type, Anxiety and depression, Bipolar disorder (HCC), Depression, Gastroesophageal reflux disease, History of stroke (2018), Knee pain, Polysubstance abuse (HCC), and Stroke (HCC).   Significant Hospital Events: Including procedures, antibiotic start and stop dates in addition to other pertinent events   5/15 transferred from [facility] intubated after brief V-fib arrest, not on pressors  5/16 Echo LVEF 40%; not able to evaluate regional wall motion   Interim History / Subjective:   Goals of care conversation yesterday and made DNR. Right Popliteal artery occlusion noted on vascular ultrasound  Objective   Blood pressure 122/77, pulse 94, temperature 99.8 F (37.7 C), temperature source Axillary, resp. rate 20, height 6' 0.01" (1.829 m), weight 66.3 kg, SpO2 100 %.    Vent Mode: PSV;CPAP FiO2 (%):  [40 %-50 %] 50 % Set Rate:  [12 bmp] 12 bmp Vt Set:  [550 mL] 550 mL PEEP:  [5 cmH20] 5 cmH20 Pressure Support:  [5 cmH20] 5 cmH20 Plateau Pressure:  [13 cmH20-18 cmH20] 15 cmH20   Intake/Output Summary (Last 24 hours) at 02/05/2023 0906 Last data filed at 02/05/2023 0744 Gross per 24 hour  Intake 2503.9 ml  Output 1400 ml  Net 1103.9 ml   Filed Weights   02/02/23 0200 02/03/23 0500 02/05/23 0414  Weight: 65.1 kg 68.7 kg 66.3 kg   Blood pressure 122/77, pulse 94, temperature 99.8 F (37.7 C), temperature source Axillary,  resp. rate 20, height 6' 0.01" (1.829 m), weight 66.3 kg, SpO2 100 %. Gen:      No acute distress, chronically ill-appearing HEENT:  EOMI, sclera anicteric Neck:     No masses; no thyromegaly Lungs:    Clear to auscultation bilaterally; normal respiratory effort CV:         Regular rate and rhythm; no murmurs Abd:      + bowel sounds; soft, non-tender; no palpable masses, no distension Ext:    Contracted extremities Skin:      Warm and dry; no  rash Neuro: Sedated, unresponsive  Labs/imaging reviewed Significant for potassium 2.9, BUN/creatinine 8/0.61 WBC 9.9, hemoglobin 10.5, platelets 257 INR 2.2 Chest x-ray on 5/18 clear lungs  Urine cx 60k colonies E. faecalis  Resolved Hospital Problem list     Assessment & Plan:   Witnessed in hospital V-fib arrest likely secondary to electrolyte abnormalities  Approximately 5 minute code in the ED Hypokalemia Hypomagnesemia  Hypocalcemia Initially profoundly low K at 1.6 P: Continue telemetry monitoring, replete electrolytes Continue amiodarone drip  Acute Encephalopathy Acute CVA - MRI 5/17 showing multifocal acute infarcts Hx CVA: w/ RUE deficits; Baseline L hemiplegia and poor mobility Hx of polysubstance abuse and alcohol use P: -concern for anoxic injury post arrest -Monitor off sedation -thiamine, folic acid, mvi -PT/OT when appropriate  Acute Hypoxic Respiratory Failure Likely RLL aspiration PNA P: On SBT's but mental status is a barrier to extubation Continue antibiotics, follow intermittent chest x-ray  Sepsis Colitis with reported melena  UTI P: -trend cbc  -cont ppi bid Continue Unasyn -f/u cultures -stools dark but Hgb stable. Will continue VTE ppx lovenox.   Type 1 Aortic dissection AVR HTN HFrEF: echo 5/15 ef 40% Sees vascular and cardiology and WF, had ascending arch and proximal hemiarch replacement due to dissection along with bioprosthetic AVR in 08/2020 -scan reviewed by Daniel Arroyo, not a candidate for operative intervention P: -BP control, prn hydralazine -hold home lasix  Popliteal artery clot. P: Has peripheral pulses and perfusion Will consult vascular.  Protein calorie malnutrition POA P: -TF per RD  Bipolar disorder, ADHD P: -hold home adderall, depakote, remeron, restoril and lexapro  Conjunctivitis - erythromycin ointment  Sons Daniel Arroyo and Daniel Arroyo updated via phone.   Best Practice (right click and "Reselect  all SmartList Selections" daily)   Diet/type: tubefeeds DVT prophylaxis: LMWH GI prophylaxis: PPI Lines: Central line Foley:  Yes, and it is still needed Code Status:  DNR Last date of multidisciplinary goals of care discussion [5/15 son [place] updated at bedside. Upset that he was not contacted first when he arrived to [facility]. He states he is the POA and would like to be contacted first for decision making]  Critical care time:     The patient is critically ill with multiple organ system failure and requires high complexity decision making for assessment and support, frequent evaluation and titration of therapies, advanced monitoring, review of radiographic studies and interpretation of complex data.   Critical Care Time devoted to patient care services, exclusive of separately billable procedures, described in this note is 35 minutes.   Chilton Greathouse MD [facility] See Amion for pager  If no response to pager , please call [phone] until 7pm After 7:00 pm call Elink  [phone] 02/05/2023, 9:07 AM

## 2023-02-05 NOTE — Progress Notes (Signed)
Daniel Arroyo requesting to be the sole Management consultant and spokesperson for his brother Daniel Arroyo. Mr. Michial Breheny, father,  present and in agreement. I called the other brother Mathhew Doody and he also verbalized his consent for [facility] to be deemed Management consultant. He is aware this could mean that [place] could change the password.

## 2023-02-05 NOTE — Progress Notes (Signed)
PT Cancellation Note  Patient Details Name: Daniel Arroyo MRN: [id number] DOB: [birth year]   Cancelled Treatment:    Reason Eval/Treat Not Completed: PT screened, no needs identified, will sign off - pt not following commands, total care at this time. PT to sign off, recommend continued efforts of pressure relief/repositioning, PROM.   Marye Round, PT DPT Acute Rehabilitation Services Secure Chat Preferred  Office [phone]    Truddie Coco 02/05/2023, 1:08 PM

## 2023-02-05 NOTE — Progress Notes (Signed)
Pharmacy Electrolyte Replacement  Recent Labs:  Recent Labs    02/05/23 0645  K 2.9*  MG 1.7  PHOS 2.0*  CREATININE 0.61    Low Critical Values (K </= 2.5, Phos </= 1, Mg </= 1) Present: None  MD Contacted: n/a  Plan: *Magnesium and KCL 40 given this AM. Patient currently on PhosNaK 500 TID.  Give another KCl per tube x1.   Link Snuffer, PharmD, BCPS, BCCCP Clinical Pharmacist Please refer to [facility] for [facility] Pharmacy numbers 02/05/2023, 1:19 PM

## 2023-02-05 NOTE — Progress Notes (Addendum)
ANTICOAGULATION CONSULT NOTE - Initial Consult  Pharmacy Consult for Heparin Indication: DVT  No Known Allergies  Patient Measurements: Height: 6' 0.01" (182.9 cm) Weight: 66.3 kg (146 lb 2.6 oz) IBW/kg (Calculated) : 77.62 Heparin Dosing Weight: 66.3 kg  Vital Signs: Temp: 99.1 F (37.3 C) (05/18 1137) Temp Source: Axillary (05/18 1137) BP: 135/78 (05/18 1200) Pulse Rate: 91 (05/18 1200)  Labs: Recent Labs    02/03/23 0238 02/03/23 1337 02/04/23 0741 02/04/23 1812 02/05/23 0645  HGB 10.3*  --  10.5*  --  10.5*  HCT 30.8*  --  31.6*  --  32.4*  PLT 199  --  246  --  257  CREATININE 0.90   < > 0.70 0.75 0.61   < > = values in this interval not displayed.    Estimated Creatinine Clearance: 100.1 mL/min (by C-G formula based on SCr of 0.61 mg/dL).   Medical History: Past Medical History:  Diagnosis Date   ADHD, adult residual type    Anxiety and depression    Bipolar disorder (HCC)    Depression    Gastroesophageal reflux disease    History of stroke 2018   Reportedly in the setting of substance abuse   Knee pain    Polysubstance abuse (HCC)    Stroke (HCC)     Medications:  Scheduled:   amLODipine  5 mg Per Tube Daily   atorvastatin  40 mg Per Tube Daily   Chlorhexidine Gluconate Cloth  6 each Topical Daily   docusate  100 mg Per Tube BID   enoxaparin (LOVENOX) injection  40 mg Subcutaneous Q24H   erythromycin   Both Eyes Q6H   feeding supplement (PROSource TF20)  60 mL Per Tube Daily   folic acid  1 mg Per Tube Daily   Gerhardt's butt cream   Topical BID   insulin aspart  0-9 Units Subcutaneous Q4H   multivitamin with minerals  1 tablet Per Tube Daily   mouth rinse  15 mL Mouth Rinse Q2H   pantoprazole (PROTONIX) IV  40 mg Intravenous Q12H   polyethylene glycol  17 g Per Tube Daily   potassium & sodium phosphates  1 packet Per Tube TID   sodium chloride flush  10-40 mL Intracatheter Q12H   thiamine  100 mg Per Tube Daily   Infusions:   sodium  chloride Stopped (02/02/23 1803)   ampicillin-sulbactam (UNASYN) IV Stopped (02/05/23 1009)   dexmedetomidine (PRECEDEX) IV infusion 0.4 mcg/kg/hr (02/05/23 1212)   feeding supplement (VITAL 1.5 CAL) 55 mL/hr at 02/05/23 1156   fentaNYL infusion INTRAVENOUS Stopped (02/03/23 0744)    Assessment: 52 years of age male admitted from [facility] ED with melena and then Vfib arrested. Transferred to [place]. Found to have age-indeterminate right popliteal DVT. Vascular consulted and no plan for surgery. Pharmacy consulted for IV Heparin drip- no bolus and low goal of 0.3 to 0.5 in setting of recent melena.   Hgb stable at 10.5. Platelets within normal limits.  No further melena per RN.  Last Enoxaparin 40mg  dose given at 0900 AM.  Neurology ok with anticoagulation.   Goal of Therapy:  Heparin level 0.3 to 0.5 units/ml Monitor platelets by anticoagulation protocol: Yes   Plan:  Discontinue Enoxaparin.   No bolus due to recent bleed and recent Enoxaparin.  Start heparin infusion at 1000 units/hr Check anti-Xa level in 6 hours and daily while on heparin Continue to monitor H&H and platelets  Link Snuffer, PharmD, BCPS, BCCCP Clinical Pharmacist Please  refer to [facility] for [facility] Pharmacy numbers 02/05/2023,12:58 PM

## 2023-02-05 NOTE — Progress Notes (Signed)
eLink Physician-Brief Progress Note Patient Name: Ayedin Winfree DOB: 27-Jan-1971 MRN: [id number]   Date of Service  02/05/2023  HPI/Events of Note  CXR needed to verify ET tube position.  eICU Interventions  CXR ordered.        [name] 02/05/2023, 1:34 AM

## 2023-02-05 NOTE — Progress Notes (Signed)
Dr. Isaiah Serge notified about patient's increased work of breathing, increased respiratory rate, and ventilator dyssynchrony while on full ventilator support. Patient has received two PRN fentanyl pushes and two PRN Versed pushes without improvement during the shift. Instructed to restart Precedex infusion.

## 2023-02-05 NOTE — Progress Notes (Signed)
OT Cancellation Note  Patient Details Name: Daniel Arroyo MRN: [id number] DOB: 10-Jan-1971   Cancelled Treatment:    Reason Eval/Treat Not Completed: Patient not medically ready;Medical issues which prohibited therapy;OT screened, no needs identified, will sign off (OT screened, no needs identified, will sign off - pt not following commands, total care at this time. OT to sign off, recommend continued efforts of pressure relief/repositioning, PROM.)  Donia Pounds 02/05/2023, 1:33 PM

## 2023-02-05 NOTE — Consult Note (Addendum)
Hospital Consult    Reason for Consult: Right leg popliteal artery thrombosis Requesting Physician: ICU MRN #:  [id number]  History of Present Illness: This is a 52 y.o. male in critical condition with history of polysubstance abuse, CVA left upper and lower extremity deficits, aortic dissection, bipolar disorder, ADHD, who presented to [facility] with GI bleed, colitis.  At the outside hospital, he had V-fib arrest with CPR for 5 minutes.  He was intubated.  Sedation medication has been weaned, however he remains unresponsive, unable to follow commands.  Recent duplex ultrasonography of the right lower extremity was performed due to pulse difference.  There was popliteal artery thrombus appreciated in the right leg.  Vascular surgery was called for further recommendations  Of note, Daniel Arroyo was made DNR yesterday. No prior history of vascular surgery  Past Medical History:  Diagnosis Date   ADHD, adult residual type    Anxiety and depression    Bipolar disorder (HCC)    Depression    Gastroesophageal reflux disease    History of stroke 2018   Reportedly in the setting of substance abuse   Knee pain    Polysubstance abuse (HCC)    Stroke [facility])     Past Surgical History:  Procedure Laterality Date   AORTA SURGERY N/A    TONSILLECTOMY      No Known Allergies  Prior to Admission medications   Medication Sig Start Date End Date Taking? Authorizing Provider  albuterol (VENTOLIN HFA) 108 (90 Base) MCG/ACT inhaler As directed 01/28/20   [provider]  amphetamine-dextroamphetamine (ADDERALL XR) 30 MG 24 hr capsule Take 30 mg by mouth in the morning and at bedtime. 03/08/19   [provider]  cyclobenzaprine (FLEXERIL) 10 MG tablet Take 10 mg by mouth daily as needed. 03/06/20   [provider]  divalproex (DEPAKOTE) 250 MG DR tablet Take 1 tab in AM, 2 tabs in PM 02/19/22   Van Clines, MD  escitalopram (LEXAPRO) 10 MG tablet Take 10 mg by mouth daily.  02/14/20   [provider]  fluticasone (FLONASE) 50 MCG/ACT nasal spray Place 1 spray into both nostrils daily. 12/28/19   [provider]  furosemide (LASIX) 20 MG tablet TAKE 1 TABLET BY MOUTH DAILY 11/29/19   Jonelle Sidle, MD  metoprolol tartrate (LOPRESSOR) 25 MG tablet Take 2 tablets by mouth 2 (two) times daily.    [provider]  mirtazapine (REMERON) 15 MG tablet Take 1 tablet (15 mg total) by mouth at bedtime. 02/21/17   Oneta Rack, NP  mirtazapine (REMERON) 30 MG tablet Take 30 mg by mouth at bedtime. 08/09/20   [provider]  omeprazole (PRILOSEC) 20 MG capsule Take 20 mg by mouth daily.    [provider]  tadalafil (CIALIS) 20 MG tablet Take 20 mg by mouth daily. 05/02/20   [provider]  temazepam (RESTORIL) 15 MG capsule Take 1 capsule by mouth at bedtime. 03/05/20   [provider]    Social History   Socioeconomic History   Marital status: Divorced    Spouse name: Not on file   Number of children: Not on file   Years of education: Not on file   Highest education level: Not on file  Occupational History   Not on file  Tobacco Use   Smoking status: Every Day    Packs/day: .5    Types: Cigarettes   Smokeless tobacco: Never  Vaping Use   Vaping Use: Never used  Substance and Sexual Activity   Alcohol use: Yes    Comment: in rehab    Drug use: Not Currently    Types: Heroin, Marijuana    Comment: in rehab   Sexual activity: Not on file  Other Topics Concern   Not on file  Social History Narrative   Right handed      Lives with father   Live in two but going to move into a one story   Caffeine 2 cups daily   Social Determinants of Health   Financial Resource Strain: Not on file  Food Insecurity: Not on file  Transportation Needs: Not on file  Physical Activity: Not on file  Stress: Not on file  Social Connections: Not on file  Intimate Partner Violence: Not on file   Family History   Problem Relation Age of Onset   Heart attack Mother        Died in early 23's   HIV Brother        Died in early 36's    ROS: Otherwise negative unless mentioned in HPI  Physical Examination  Vitals:   02/05/23 1137 02/05/23 1200  BP:  135/78  Pulse:  91  Resp:  19  Temp: 99.1 F (37.3 C)   SpO2:  100%   Body mass index is 19.82 kg/m.  General: Cachectic, left wrist contracture, failure to thrive, intubated, not sedated Gait: Not observed HENT: Intubated Pulmonary: Intubated Cardiac: Regular rate Abdomen: soft Skin: without rashes Vascular Exam/Pulses: 2+ femoral pulses, left-sided triphasic signal in the foot, right-sided monophasic signal in the foot both dorsalis pedis and posterior tibial arteries Extremities: without ischemic changes, without Gangrene , without cellulitis; without open wounds;  Musculoskeletal: no muscle wasting or atrophy  Neurologic: unresponsive Psychiatric:unable to assess Lymph:  Unremarkable  CBC    Component Value Date/Time   WBC 9.9 02/05/2023 0645   RBC 4.14 (L) 02/05/2023 0645   HGB 10.5 (L) 02/05/2023 0645   HCT 32.4 (L) 02/05/2023 0645   PLT 257 02/05/2023 0645   MCV 78.3 (L) 02/05/2023 0645   MCH 25.4 (L) 02/05/2023 0645   MCHC 32.4 02/05/2023 0645   RDW 19.3 (H) 02/05/2023 0645   LYMPHSABS 2.1 12/13/2013 1358   MONOABS 0.7 12/13/2013 1358   EOSABS 0.2 12/13/2013 1358   BASOSABS 0.1 12/13/2013 1358    BMET    Component Value Date/Time   NA 140 02/05/2023 0645   K 2.9 (L) 02/05/2023 0645   CL 108 02/05/2023 0645   CO2 23 02/05/2023 0645   GLUCOSE 96 02/05/2023 0645   BUN 8 02/05/2023 0645   CREATININE 0.61 02/05/2023 0645   CALCIUM 7.4 (L) 02/05/2023 0645   GFRNONAA >60 02/05/2023 0645   GFRAA >60 02/10/2017 0410    COAGS: Lab Results  Component Value Date   INR 2.2 (H) 02/02/2023       ASSESSMENT/PLAN: This is a 52 y.o. male with recent finding of right-sided popliteal artery thrombus.  I am unsure as  to when this occurred.  He has a signal distally with no signs of acute ischemia or critical limb ischemia.  Unfortunately, with his current mental status, he is unable to answer any questions. On physical exam, he does not have acute limb ischemia Daniel Arroyo does not walk. With his comorbidities, and current medical problems, I think there is high likelihood he does not survive this admission.  I called Daniel Arroyo's son, and had a long conversation with him regarding his father's right popliteal artery  occlusion.  Being that he is nonambulatory, I did not offer revascularization.  This thrombus could worsen if not anticoagulated, therefore he would benefit from therapeutic anticoagulation.  If he is unable to tolerate this, then it can be stopped.  Should foot ischemia progress over the coming days/weeks/months, the only operation I would offer would be right above-knee amputation.  Daniel Arroyo's son Ludger Nutting was very appreciative of our conversation, and understood the above.  I will sign off at this time.  Please call if questions or concerns arise.  Recommend comfort care at this time.    Fara Olden MD [facility] [phone] 02/05/2023  12:35 PM

## 2023-02-06 ENCOUNTER — Inpatient Hospital Stay (HOSPITAL_COMMUNITY): Payer: PPO

## 2023-02-06 DIAGNOSIS — I6310 Cerebral infarction due to embolism of unspecified precerebral artery: Secondary | ICD-10-CM

## 2023-02-06 DIAGNOSIS — I469 Cardiac arrest, cause unspecified: Secondary | ICD-10-CM | POA: Diagnosis not present

## 2023-02-06 LAB — HEPARIN LEVEL (UNFRACTIONATED)
Heparin Unfractionated: 0.15 IU/mL — ABNORMAL LOW (ref 0.30–0.70)
Heparin Unfractionated: 0.61 IU/mL (ref 0.30–0.70)

## 2023-02-06 LAB — CBC
HCT: 30.6 % — ABNORMAL LOW (ref 39.0–52.0)
Hemoglobin: 9.7 g/dL — ABNORMAL LOW (ref 13.0–17.0)
MCH: 25.4 pg — ABNORMAL LOW (ref 26.0–34.0)
MCHC: 31.7 g/dL (ref 30.0–36.0)
MCV: 80.1 fL (ref 80.0–100.0)
Platelets: 297 10*3/uL (ref 150–400)
RBC: 3.82 MIL/uL — ABNORMAL LOW (ref 4.22–5.81)
RDW: 19.5 % — ABNORMAL HIGH (ref 11.5–15.5)
WBC: 7.4 10*3/uL (ref 4.0–10.5)
nRBC: 0 % (ref 0.0–0.2)

## 2023-02-06 LAB — GLUCOSE, CAPILLARY
Glucose-Capillary: 99 mg/dL (ref 70–99)
Glucose-Capillary: 75 mg/dL (ref 70–99)
Glucose-Capillary: 75 mg/dL (ref 70–99)
Glucose-Capillary: 58 mg/dL — ABNORMAL LOW (ref 70–99)
Glucose-Capillary: 98 mg/dL (ref 70–99)
Glucose-Capillary: 84 mg/dL (ref 70–99)
Glucose-Capillary: 104 mg/dL — ABNORMAL HIGH (ref 70–99)

## 2023-02-06 LAB — TRIGLYCERIDES: Triglycerides: 46 mg/dL (ref <150)

## 2023-02-06 LAB — CULTURE, BLOOD (ROUTINE X 2)

## 2023-02-06 LAB — BASIC METABOLIC PANEL
Anion gap: 8 (ref 5–15)
BUN: 9 mg/dL (ref 6–20)
CO2: 24 mmol/L (ref 22–32)
Calcium: 7.6 mg/dL — ABNORMAL LOW (ref 8.9–10.3)
Chloride: 108 mmol/L (ref 98–111)
Creatinine, Ser: 0.57 mg/dL — ABNORMAL LOW (ref 0.61–1.24)
GFR, Estimated: >60 mL/min (ref >60)
Glucose, Bld: 100 mg/dL — ABNORMAL HIGH (ref 70–99)
Potassium: 3.5 mmol/L (ref 3.5–5.1)
Sodium: 140 mmol/L (ref 135–145)

## 2023-02-06 MED ORDER — HEPARIN (PORCINE) 25000 UT/250ML-% IV SOLN
1450.0000 [IU]/h | INTRAVENOUS | Status: DC
  Administered 2023-02-06: 1150 [IU]/h via INTRAVENOUS
  Filled 2023-02-06: qty 250

## 2023-02-06 MED ORDER — DEXTROSE 50 % IV SOLN
12.5000 g | INTRAVENOUS | Status: AC
  Administered 2023-02-06: 12.5 g via INTRAVENOUS

## 2023-02-06 MED ORDER — DEXTROSE 5 % IV SOLN: INTRAVENOUS | Status: DC

## 2023-02-06 MED ORDER — DEXTROSE 50 % IV SOLN
INTRAVENOUS | Status: AC
  Filled 2023-02-06: qty 50

## 2023-02-06 NOTE — Progress Notes (Signed)
Transport @ bedside and pt stable for transport to MRI

## 2023-02-06 NOTE — Care Management Note (Signed)
Patient currently scheduled for x-ray, and pending MRI at  1700 hours.

## 2023-02-06 NOTE — Progress Notes (Signed)
SLP Cancellation Note  Patient Details Name: Daniel Arroyo MRN: [id number] DOB: May 17, 1971   Cancelled treatment:       Reason Eval/Treat Not Completed: Medical issues which prohibited therapy Pt remains intubated and sedated and unable to participate in speech/language evaluation. ST service to follow as able.  Shanikqua Zarzycki P. Rorey Hodges, M.S., CCC-SLP Speech-Language Pathologist Acute Rehabilitation Services Pager: [phone]  Susanne Borders Edras Wilford 02/06/2023, 8:58 AM

## 2023-02-06 NOTE — Progress Notes (Signed)
Hypoglycemic Event  CBG: 58   Treatment: D50 25 mL (12.5 gm)  Symptoms: None  Follow-up CBG: Time:1548 CBG Result:98  Possible Reasons for Event: Unknown  Comments/MD notified: NO new orders      Daniel Arroyo  [name]

## 2023-02-06 NOTE — Progress Notes (Signed)
ANTICOAGULATION CONSULT NOTE - Followup Consult  Pharmacy Consult for Heparin Indication: DVT  No Known Allergies  Patient Measurements: Height: 6' 0.01" (182.9 cm) Weight: 66.3 kg (146 lb 2.6 oz) IBW/kg (Calculated) : 77.62 Heparin Dosing Weight: 66 kg  Vital Signs: Temp: 98.1 F (36.7 C) (05/19 1931) Temp Source: Axillary (05/19 1931) BP: 125/81 (05/19 1700) Pulse Rate: 84 (05/19 1700)  Labs: Recent Labs    02/04/23 0741 02/04/23 1812 02/05/23 0645 02/05/23 1603 02/05/23 1957 02/06/23 0818 02/06/23 1936  HGB 10.5*  --  10.5*  --   --  9.7*  --   HCT 31.6*  --  32.4*  --   --  30.6*  --   PLT 246  --  257  --   --  297  --   HEPARINUNFRC  --   --   --   --  0.16* 0.15* 0.61  CREATININE 0.70   < > 0.61 0.58*  --  0.57*  --    < > = values in this interval not displayed.     Estimated Creatinine Clearance: 100.1 mL/min (A) (by C-G formula based on SCr of 0.57 mg/dL (L)).  Assessment: 52 years of age male admitted from [facility] ED with melena and then Vfib arrested. Transferred to [facility]. Found to have age-indeterminate right popliteal DVT and scattered BL acute CVA. Note patient with hx of aortic dissection s/p valve and arch replacement. Vascular consulted with no plan for surgery. Pharmacy consulted for IV Heparin drip- no bolus and low goal of 0.3 to 0.5 in setting of recent melena.    No further melena per RN. Neurology ok with anticoagulation. Heparin level 0.61 is supratherapeutic. Heparin was charted as stopped at 1733 and not charted as running again since then but per night RN, heparin was running at 1200 units/hr when she came on shift. Patient went to MRI and came back around 18:30. Heparin is running in L PIV, phlebotomy drew level from R arm. Patient was subtherapeutic on 1100 units/hr.   Goal of Therapy:  Heparin level 0.3 to 0.5 units/ml Monitor platelets by anticoagulation protocol: Yes   Plan:   Hold heparin for 30 minutes and decrease heparin to  1150 units/hr   Monitor daily heparin level, CBC Monitor for signs/symptoms of bleeding   Alphia Moh, PharmD, BCPS, BCCP Clinical Pharmacist  Please check AMION for all [facility] Pharmacy phone numbers After 10:00 PM, call Main Pharmacy [phone]

## 2023-02-06 NOTE — Progress Notes (Signed)
ANTICOAGULATION CONSULT NOTE - Followup Consult  Pharmacy Consult for Heparin Indication: DVT  No Known Allergies  Patient Measurements: Height: 6' 0.01" (182.9 cm) Weight: 66.3 kg (146 lb 2.6 oz) IBW/kg (Calculated) : 77.62 Heparin Dosing Weight: 66 kg  Vital Signs: Temp: 98.8 F (37.1 C) (05/19 1123) Temp Source: Axillary (05/19 1123) BP: 117/68 (05/19 1105) Pulse Rate: 73 (05/19 1105)  Labs: Recent Labs    02/04/23 0741 02/04/23 1812 02/05/23 0645 02/05/23 1603 02/05/23 1957 02/06/23 0818  HGB 10.5*  --  10.5*  --   --  9.7*  HCT 31.6*  --  32.4*  --   --  30.6*  PLT 246  --  257  --   --  297  HEPARINUNFRC  --   --   --   --  0.16* 0.15*  CREATININE 0.70   < > 0.61 0.58*  --  0.57*   < > = values in this interval not displayed.    Estimated Creatinine Clearance: 100.1 mL/min (A) (by C-G formula based on SCr of 0.57 mg/dL (L)).  Assessment: 52 years of age male admitted from [facility] ED with melena and then Vfib arrested. Transferred to [facility]. Found to have age-indeterminate right popliteal DVT and scattered BL acute CVA. Note patient with hx of aortic dissection s/p valve and arch replacement. Vascular consulted with no plan for surgery. Pharmacy consulted for IV Heparin drip- no bolus and low goal of 0.3 to 0.5 in setting of recent melena.    No further melena per RN. Neurology ok with anticoagulation. Heparin level 0.15 is subtherapeutic on 1100 units/hr. Per RN, no issues with infusion or bleeding other than small amount of seeping from penis that is not new.   Goal of Therapy:  Heparin level 0.3 to 0.5 units/ml Monitor platelets by anticoagulation protocol: Yes   Plan:   Increase heparin to 1200 units/hr  Repeat heparin level in 6 hours.  Monitor daily heparin level, CBC Monitor for signs/symptoms of bleeding   Link Snuffer, PharmD, BCPS, BCCCP Clinical Pharmacist Please refer to [facility] for [facility] Pharmacy numbers 02/06/2023, 11:25 AM

## 2023-02-06 NOTE — Progress Notes (Signed)
Transported from 3M12 to MRI and back without complications

## 2023-02-06 NOTE — Progress Notes (Addendum)
NAME:  Daniel Arroyo, MRN:  [id number], DOB:  Apr 22, 1971, LOS: 4 ADMISSION DATE:  02/02/2023, CONSULTATION DATE:  02/06/23 REFERRING MD:  EDP, CHIEF COMPLAINT:  cardiac arrest   History of Present Illness:   Daniel Arroyo is a 52 y.o. M with PMH significant for polysubstance abuse and CVA with residual RUE deficits, LLE  injury for which never sought treatment for which never sought treatment and subsequent foot contracture and difficulty ambulating, Type II aortic dissection, bipolar disorder and ADHD who initially presented to [facility] [place] with three days of abdominal pain dark stools.   His potassium was markedly low at 1.6, Hgb WNL. He was treated with octreotide, pantoprazole, antibiotics, and given potassium for his hypokalemia. An abdominal CT showed colitis. He also was found to have aortic dissection which was previously known and unchanged from his previous CT. He then experienced a V-fib cardiac arrest and had CPR for 5 minutes with standard ACLS. He was intubated and briefly required pressors.  CXR with RLL infiltrate. The ED provider spoke with TCTS regarding dissection as follows "Given his history of type II aortic dissection a CT was done of his chest, now showing a type I aortic dissection. The case was discussed with cardio thoracic surgery who found in his records that he has had an aortic arch and valve replacement and therefore is not a surgical candidate. He should be treated as a type II / B dissection"  He was transported to [facility] and arrived hemodynamically stable but not responsive with sedation wean.   Per his father who is at the bedside, pt lives alone and he thinks has been smoking marijuana.  He is unsure about other drugs, does not think he has been drinking a significant amount of alcohol.  Pt is minimally able to care for himself, his father was checking on him 1-2x per day until a couple of weeks ago when pt was verbally abusive, so he stopped coming and has not  seen him recently until today   Pertinent  Medical History   has a past medical history of ADHD, adult residual type, Anxiety and depression, Bipolar disorder (HCC), Depression, Gastroesophageal reflux disease, History of stroke (2018), Knee pain, Polysubstance abuse (HCC), and Stroke (HCC).   Significant Hospital Events: Including procedures, antibiotic start and stop dates in addition to other pertinent events   5/15 transferred from [facility] intubated after brief V-fib arrest, not on pressors  5/16 Echo LVEF 40%; not able to evaluate regional wall motion 5/17 Neurology consulted MRI showing acute ischemic infarcts, made DNR 5/18 Started anticoagulation for right popliteal blood clot. Vascular surgery consulted.  Interim History / Subjective:   Right Popliteal artery occlusion noted on vascular ultrasound. Vascular surgery consulted More awake today.  Weaned on 5/5 for 2 hours.  Objective   Blood pressure 133/80, pulse 73, temperature 99 F (37.2 C), temperature source Axillary, resp. rate 11, height 6' 0.01" (1.829 m), weight 66.3 kg, SpO2 100 %.    Vent Mode: PSV;CPAP FiO2 (%):  [40 %] 40 % Set Rate:  [12 bmp] 12 bmp Vt Set:  [550 mL] 550 mL PEEP:  [5 cmH20] 5 cmH20 Pressure Support:  [5 cmH20] 5 cmH20 Plateau Pressure:  [10 cmH20-16 cmH20] 16 cmH20   Intake/Output Summary (Last 24 hours) at 02/06/2023 0822 Last data filed at 02/06/2023 0800 Gross per 24 hour  Intake 2374.98 ml  Output 475 ml  Net 1899.98 ml   Filed Weights   02/02/23 0200 02/03/23 0500 02/05/23  0414  Weight: 65.1 kg 68.7 kg 66.3 kg   Gen:      No acute distress, chronically ill-appearing HEENT:  EOMI, sclera anicteric Neck:     No masses; no thyromegaly, ETT Lungs:    Clear to auscultation bilaterally; normal respiratory effort CV:         Regular rate and rhythm; no murmurs Abd:      + bowel sounds; soft, non-tender; no palpable masses, no distension Ext:    Contracted Extremity Skin:      Warm and  dry; no rash Neuro: Opens eyes to commands  Labs/imaging reviewed Significant for BUN/creatinine 8/0.58 Hemoglobin 10.5, platelets 257 No new imaging Urine cx 60k colonies sensitive E. faecalis  Resolved Hospital Problem list     Assessment & Plan:   Witnessed in hospital V-fib arrest likely secondary to electrolyte abnormalities  Approximately 5 minute code in the ED Hypokalemia Hypomagnesemia  Hypocalcemia Initially profoundly low K at 1.6 P: Continue telemetry monitoring, replete electrolytes Cardiology consulted  Acute Encephalopathy Acute CVA - MRI 5/17 showing multifocal acute infarcts Hx CVA: w/ RUE deficits; Baseline L hemiplegia and poor mobility Hx of polysubstance abuse and alcohol use P: -concern for anoxic injury post arrest -Minimize sedation -thiamine, folic acid, mvi -PT/OT when appropriate  Acute Hypoxic Respiratory Failure Likely RLL aspiration PNA P: On SBT's but mental status is a barrier to extubation Continue antibiotics, follow intermittent chest x-ray  Sepsis Colitis with reported melena  Enterococcus UTI P: -trend cbc  -cont ppi bid Continue Unasyn -f/u cultures -stools dark but Hgb stable. Will continue heparin drip  Type 1 Aortic dissection AVR HTN HFrEF: echo 5/15 ef 40% Sees vascular and cardiology and WF, had ascending arch and proximal hemiarch replacement due to dissection along with bioprosthetic AVR in 08/2020 -scan reviewed by Dr. Leafy Ro, not a candidate for operative intervention P: -BP control, prn hydralazine -hold home lasix  Popliteal artery clot. P: Consulted vascular.  He has been started on heparin drip Monitor CBC as he reportedly had melena on presentation at [facility].  Hemoglobin has been stable so far with no evidence of bleed.  Protein calorie malnutrition POA P: TF per RD  Bipolar disorder, ADHD P: Hold home adderall, depakote, remeron, restoril and lexapro  Conjunctivitis Erythromycin  ointment  Rt arm swelling Ultrasound ordered to evaluate for DVT.  Goals of Care DNR Daniel Arroyo would like to be the only person given updates and he is the POA per rest of family. Family meeting with neurology an critical care team today.  Discussed his functional status which is poor at baseline and new insults during this admission from cardiac arrest and strokes.  Not sure if the family wants a tracheostomy but if they do decide to proceed that route then it will have to be an ENT procedure due to neck contractures.  We will get an EEG and MRI today to reevaluate his neurostatus Plan to get together tomorrow with neurology around 11 AM for family meeting.  Best Practice (right click and "Reselect all SmartList Selections" daily)   Diet/type: tubefeeds DVT prophylaxis: systemic heparin GI prophylaxis: PPI Lines: N/A Foley:  N/A Code Status:  DNR Last date of multidisciplinary goals of care discussion [5/19 Daniel Daniel Arroyo updated.  Critical care time:     The patient is critically ill with multiple organ system failure and requires high complexity decision making for assessment and support, frequent evaluation and titration of therapies, advanced monitoring, review of radiographic studies and interpretation  of complex data.   Critical Care Time devoted to patient care services, exclusive of separately billable procedures, described in this note is 35 minutes.   Chilton Greathouse MD [facility] See Amion for pager  If no response to pager , please call [phone] until 7pm After 7:00 pm call Elink  [phone] 02/06/2023, 8:22 AM

## 2023-02-06 NOTE — Progress Notes (Signed)
STROKE TEAM PROGRESS NOTE   INTERVAL HISTORY  Multiple family members in the room today along with Dr. Isaiah Serge ICU attending and ICU nurse.  A full update was given to the family.  Patient was able to blink to command.  It appears that the improvement family was looking for and they will continue full supportive care.  Will get brain MRI and EEG to help him with prognostication.  Family meeting scheduled tentatively for tomorrow around 1 PM.  Family wishes to see the patient off sedation.  Vitals:   02/06/23 1300 02/06/23 1400 02/06/23 1500 02/06/23 1510  BP: 114/69 128/76 (!) 142/77   Pulse: 67 84 79   Resp: 10 15 12    Temp:    98.1 F (36.7 C)  TempSrc:    Axillary  SpO2: 100% 99% 100%   Weight:      Height:       CBC:  Recent Labs  Lab 02/05/23 0645 02/06/23 0818  WBC 9.9 7.4  HGB 10.5* 9.7*  HCT 32.4* 30.6*  MCV 78.3* 80.1  PLT 257 297    Basic Metabolic Panel:  Recent Labs  Lab 02/04/23 1812 02/05/23 0645 02/05/23 1603 02/06/23 0818  NA 137 140 138 140  K 3.1* 2.9* 3.5 3.5  CL 107 108 110 108  CO2 20* 23 22 24   GLUCOSE 179* 96 121* 100*  BUN 11 8 8 9   CREATININE 0.75 0.61 0.58* 0.57*  CALCIUM 7.2* 7.4* 7.2* 7.6*  MG 1.7 1.7  --   --   PHOS 3.0 2.0*  --   --     Lipid Panel:  Recent Labs  Lab 02/05/23 0645 02/06/23 0818  CHOL 54  --   TRIG 54 46  HDL 20*  --   CHOLHDL 2.7  --   VLDL 11  --   LDLCALC 23  --     HgbA1c:  Recent Labs  Lab 02/02/23 0219  HGBA1C 5.1    Urine Drug Screen:  Recent Labs  Lab 02/02/23 0227  LABOPIA NONE DETECTED  COCAINSCRNUR NONE DETECTED  LABBENZ NONE DETECTED  AMPHETMU NONE DETECTED  THCU NONE DETECTED  LABBARB NONE DETECTED     Alcohol Level  Recent Labs  Lab 02/02/23 1036  ETH <10     IMAGING past 24 hours No results found.  PHYSICAL EXAM  Temp:  [98.1 F (36.7 C)-100 F (37.8 C)] 98.1 F (36.7 C) (05/19 1510) Pulse Rate:  [65-86] 79 (05/19 1500) Resp:  [9-24] 12 (05/19 1500) BP:  (98-142)/(60-81) 142/77 (05/19 1500) SpO2:  [99 %-100 %] 100 % (05/19 1500) FiO2 (%):  [40 %] 40 % (05/19 1105)   General: Intubated and lightly sedated.  Appears to be critically ill. CV: Regular rate and rhythm Neuroexam: He was able to blink to command today.  Nonverbal.  Tends to keep eyes closed.  He does not Gaffer.  Head tilted to the right.  I had to force his eyes open pupils appear equal round reactive.  Bilateral corneals are intact.  No consistent blink to threat bilaterally.   Eyes are midline at baseline.  Weak oculocephalics present. Left upper and lower extremity severe contractures from previous stroke.  No movement to noxious stimuli in the right upper or lower extremity.  ASSESSMENT/PLAN Mr. Keyner Finnin is a 52 y.o. male with history of EtOH abuse and chronic large right cerebral hemisphere ischemic infarction with extensive associated encephalomalacia thought to be secondary to substance abuse, ADHD, anxiety, depression, type II  aortic dissection s/p aortic arch and valve replacement, bipolar disorder, who was transferred to the ICU at [facility] from [facility] for management of GIB, sepsis and possible NSTEMI. While in the ED at [facility], he had a brief episode of V-fib arrest with ROSC achieved after 5 minutes of CPR.   Stroke: Scattered bilateral acute ischemic infarcts, cardioembolic pattern, etiology unclear, may from NSTEMI, endocarditis, recurrent aortic dissection CT head No acute abnormality.  Chronic right MCA infarct CTA head & neck once pt more stabilized and family desire aggressive care MRI  acute infarcts in right occipital lobe, left inferior cerebellum, bilateral caudate heads and anterior right frontal lobe US venous BLE no DVT 2D Echo EF 40%.  Left ventricle mildly decreased function, septal hypokinesis, concentric left ventricular hypertrophy May consider TEE if pt improves with family desires aggressive care LDL 23 HgbA1c 5.1 UDS neg  VTE prophylaxis -  SCD's/Lovenox  No antithrombotics prior to admission, now on heparin GGT due to thrombus in right popliteal vein.  Vascular surgery consulted. Therapy recommendations: Pending Disposition: Pending, per RN, family would like to watch for several days, if not improving, will lean toward comfort care  Hypertension Aortic dissection AVR  HFrEF  s/p aortic arch and valve replacement EF 40%, concentric left ventricular hypertrophy BP Stable Long-term BP goal normotensive Consider CTA aorta to evaluate aortic dissection once stable and family desire aggressive care  Possible STEMI Episode of V-fib arrest S/p CPR and ROSC achieved after 5 minutes  Cardiology on board Could be due to severe hypokalemia with K < 2.0 On amiodarone  PAD Right arterial US - 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 Appearance of acute thrombus noted. Reconstitution  distally via collatels with monophasic flow observed in the PTA, DPA, and  Pero A.  Consider CTA aorta to evaluate aortic dissection once stable and family desire aggressive care  Hyperlipidemia Home meds: None LDL 53 goal < 70 on atorvastatin 40 mg Continue statin at discharge  Acute hypoxic respiratory failure Concern for aspiration pneumonia Intubated  On Zosyn Vent management per CCM  Sepsis Colitis with reported melena  UTI On Zosyn On Protonix twice daily Per CCM  Dysphagia OG tube Nutrition consult for tube feeds Feeds infusing Speech therapy when able  Other Stroke Risk Factors ETOH use, alcohol level <10, advised to drink no more than 2 drink(s) a day History of polysubstance abuse Hx stroke/TIA - 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  He is able to blink to command.  Otherwise no other significant change.  He is on heparin drip due to popliteal vein thrombus.  Vascular surgery consulted by CCM.  Family will regroup tomorrow after MRI and EEG to help with prognostication.  They will consider getting full workup which was put on hold pending their decision.  This patient is critically ill due to respiratory distress, stroke, non-STEMI, GI bleeding, sepsis and at significant risk of neurological worsening, death form heart failure, respiratory failure, recurrent stroke, bleeding from [facility], seizure, sepsis. This patient's care requires constant monitoring of vital signs, hemodynamics, respiratory and cardiac monitoring, review of multiple databases, neurological assessment, discussion with family, other specialists and medical decision making of high complexity. I spent 45 minutes of neurocritical care  time in the care of this patient.   Jazman Reuter,MD     To contact Stroke Continuity provider, please refer to [email or web address]. After hours, contact General Neurology

## 2023-02-06 NOTE — Progress Notes (Signed)
Went to bedside to place EEG leads and per RN pt just left for MRI, check back in 1 hour

## 2023-02-07 ENCOUNTER — Inpatient Hospital Stay (HOSPITAL_COMMUNITY): Payer: PPO

## 2023-02-07 DIAGNOSIS — I469 Cardiac arrest, cause unspecified: Secondary | ICD-10-CM | POA: Diagnosis not present

## 2023-02-07 DIAGNOSIS — R4182 Altered mental status, unspecified: Secondary | ICD-10-CM | POA: Diagnosis not present

## 2023-02-07 DIAGNOSIS — R609 Edema, unspecified: Secondary | ICD-10-CM

## 2023-02-07 DIAGNOSIS — J9601 Acute respiratory failure with hypoxia: Secondary | ICD-10-CM | POA: Diagnosis not present

## 2023-02-07 DIAGNOSIS — I63119 Cerebral infarction due to embolism of unspecified vertebral artery: Secondary | ICD-10-CM

## 2023-02-07 DIAGNOSIS — R569 Unspecified convulsions: Secondary | ICD-10-CM | POA: Diagnosis not present

## 2023-02-07 LAB — BASIC METABOLIC PANEL
Anion gap: 8 (ref 5–15)
BUN: 10 mg/dL (ref 6–20)
CO2: 23 mmol/L (ref 22–32)
Calcium: 7.4 mg/dL — ABNORMAL LOW (ref 8.9–10.3)
Chloride: 107 mmol/L (ref 98–111)
Creatinine, Ser: 0.48 mg/dL — ABNORMAL LOW (ref 0.61–1.24)
GFR, Estimated: >60 mL/min (ref >60)
Glucose, Bld: 113 mg/dL — ABNORMAL HIGH (ref 70–99)
Potassium: 3.5 mmol/L (ref 3.5–5.1)
Sodium: 138 mmol/L (ref 135–145)

## 2023-02-07 LAB — CULTURE, BLOOD (ROUTINE X 2)
Culture: NO GROWTH
Culture: NO GROWTH

## 2023-02-07 LAB — CBC
HCT: 29.2 % — ABNORMAL LOW (ref 39.0–52.0)
Hemoglobin: 9.2 g/dL — ABNORMAL LOW (ref 13.0–17.0)
MCH: 25.6 pg — ABNORMAL LOW (ref 26.0–34.0)
MCHC: 31.5 g/dL (ref 30.0–36.0)
MCV: 81.1 fL (ref 80.0–100.0)
Platelets: 326 10*3/uL (ref 150–400)
RBC: 3.6 MIL/uL — ABNORMAL LOW (ref 4.22–5.81)
RDW: 19.4 % — ABNORMAL HIGH (ref 11.5–15.5)
WBC: 6.4 10*3/uL (ref 4.0–10.5)
nRBC: 0 % (ref 0.0–0.2)

## 2023-02-07 LAB — HEPARIN LEVEL (UNFRACTIONATED)
Heparin Unfractionated: 0.13 IU/mL — ABNORMAL LOW (ref 0.30–0.70)
Heparin Unfractionated: 0.12 IU/mL — ABNORMAL LOW (ref 0.30–0.70)
Heparin Unfractionated: 0.13 IU/mL — ABNORMAL LOW (ref 0.30–0.70)

## 2023-02-07 LAB — GLUCOSE, CAPILLARY
Glucose-Capillary: 125 mg/dL — ABNORMAL HIGH (ref 70–99)
Glucose-Capillary: 90 mg/dL (ref 70–99)
Glucose-Capillary: 94 mg/dL (ref 70–99)

## 2023-02-07 MED ORDER — ACETAMINOPHEN 325 MG PO TABS: 650.0000 mg | ORAL_TABLET | Freq: Four times a day (QID) | ORAL | Status: DC | PRN

## 2023-02-07 MED ORDER — HYDROMORPHONE BOLUS VIA INFUSION
1.0000 mg | INTRAVENOUS | Status: DC | PRN
  Administered 2023-02-07 – 2023-02-09 (×4): 1 mg via INTRAVENOUS

## 2023-02-07 MED ORDER — PANTOPRAZOLE SODIUM 40 MG IV SOLR: 40.0000 mg | Freq: Every day | INTRAVENOUS | Status: DC

## 2023-02-07 MED ORDER — POTASSIUM CHLORIDE CRYS ER 20 MEQ PO TBCR
20.0000 meq | EXTENDED_RELEASE_TABLET | Freq: Once | ORAL | Status: AC
  Administered 2023-02-07: 20 meq via ORAL
  Filled 2023-02-07: qty 1

## 2023-02-07 MED ORDER — HYDROMORPHONE HCL-NACL 50-0.9 MG/50ML-% IV SOLN: 0.0000 mg/h | INTRAVENOUS | Status: DC

## 2023-02-07 MED ORDER — POLYVINYL ALCOHOL 1.4 % OP SOLN: 1.0000 [drp] | Freq: Four times a day (QID) | OPHTHALMIC | Status: DC | PRN

## 2023-02-07 MED ORDER — ACETAMINOPHEN 650 MG RE SUPP: 650.0000 mg | Freq: Four times a day (QID) | RECTAL | Status: DC | PRN

## 2023-02-07 MED ORDER — GLYCOPYRROLATE 0.2 MG/ML IJ SOLN
0.2000 mg | INTRAMUSCULAR | Status: DC | PRN
  Filled 2023-02-07: qty 1

## 2023-02-07 MED ORDER — GLYCOPYRROLATE 0.2 MG/ML IJ SOLN
0.2000 mg | INTRAMUSCULAR | Status: DC | PRN
  Administered 2023-02-09: 0.2 mg via SUBCUTANEOUS

## 2023-02-07 MED ORDER — AMPHETAMINE-DEXTROAMPHETAMINE 10 MG PO TABS
10.0000 mg | ORAL_TABLET | Freq: Two times a day (BID) | ORAL | Status: DC
  Administered 2023-02-07: 10 mg
  Filled 2023-02-07: qty 1

## 2023-02-07 MED ORDER — GLYCOPYRROLATE 1 MG PO TABS: 1.0000 mg | ORAL_TABLET | ORAL | Status: DC | PRN

## 2023-02-07 MED ORDER — SODIUM CHLORIDE 0.9 % IV SOLN: INTRAVENOUS | Status: DC

## 2023-02-07 MED ORDER — MIDAZOLAM HCL 2 MG/2ML IJ SOLN
2.0000 mg | INTRAMUSCULAR | Status: DC | PRN
  Administered 2023-02-08: 2 mg via INTRAVENOUS
  Filled 2023-02-07: qty 2

## 2023-02-07 MED ORDER — FUROSEMIDE 10 MG/ML IJ SOLN
40.0000 mg | Freq: Once | INTRAMUSCULAR | Status: AC
  Administered 2023-02-07: 40 mg via INTRAVENOUS
  Filled 2023-02-07: qty 4

## 2023-02-07 MED ORDER — SODIUM CHLORIDE 0.9 % IV SOLN
0.0000 mg/h | INTRAVENOUS | Status: DC
  Administered 2023-02-07: 1 mg/h via INTRAVENOUS
  Administered 2023-02-08: 2 mg/h via INTRAVENOUS
  Filled 2023-02-07 (×4): qty 5

## 2023-02-07 NOTE — Plan of Care (Signed)
Problem: Education: Goal: Knowledge of General Education information will improve Description: Including pain rating scale, medication(s)/side effects and non-pharmacologic comfort measures Outcome: Not Progressing   Problem: Health Behavior/Discharge Planning: Goal: Ability to manage health-related needs will improve Outcome: Not Progressing   Problem: Clinical Measurements: Goal: Ability to maintain clinical measurements within normal limits will improve Outcome: Not Progressing Goal: Will remain free from infection Outcome: Not Progressing Goal: Diagnostic test results will improve Outcome: Not Progressing Goal: Respiratory complications will improve Outcome: Not Progressing Goal: Cardiovascular complication will be avoided Outcome: Not Progressing   Problem: Activity: Goal: Risk for activity intolerance will decrease Outcome: Not Progressing   Problem: Nutrition: Goal: Adequate nutrition will be maintained Outcome: Not Progressing   Problem: Coping: Goal: Level of anxiety will decrease Outcome: Not Progressing   Problem: Elimination: Goal: Will not experience complications related to bowel motility Outcome: Not Progressing Goal: Will not experience complications related to urinary retention Outcome: Not Progressing   Problem: Pain Managment: Goal: General experience of comfort will improve Outcome: Not Progressing   Problem: Safety: Goal: Ability to remain free from injury will improve Outcome: Not Progressing   Problem: Skin Integrity: Goal: Risk for impaired skin integrity will decrease Outcome: Not Progressing   Problem: Safety: Goal: Non-violent Restraint(s) Outcome: Not Progressing   Problem: Activity: Goal: Ability to tolerate increased activity will improve Outcome: Not Progressing   Problem: Respiratory: Goal: Ability to maintain a clear airway and adequate ventilation will improve Outcome: Not Progressing   Problem: Role  Relationship: Goal: Method of communication will improve Outcome: Not Progressing   Problem: Activity: Goal: Ability to tolerate increased activity will improve Outcome: Not Progressing   Problem: Respiratory: Goal: Ability to maintain a clear airway and adequate ventilation will improve Outcome: Not Progressing   Problem: Role Relationship: Goal: Method of communication will improve Outcome: Not Progressing   Problem: Education: Goal: Ability to describe self-care measures that may prevent or decrease complications (Diabetes Survival Skills Education) will improve Outcome: Not Progressing Goal: Individualized Educational Video(s) Outcome: Not Progressing   Problem: Coping: Goal: Ability to adjust to condition or change in health will improve Outcome: Not Progressing   Problem: Fluid Volume: Goal: Ability to maintain a balanced intake and output will improve Outcome: Not Progressing   Problem: Health Behavior/Discharge Planning: Goal: Ability to identify and utilize available resources and services will improve Outcome: Not Progressing Goal: Ability to manage health-related needs will improve Outcome: Not Progressing   Problem: Metabolic: Goal: Ability to maintain appropriate glucose levels will improve Outcome: Not Progressing   Problem: Skin Integrity: Goal: Risk for impaired skin integrity will decrease Outcome: Not Progressing   Problem: Nutritional: Goal: Maintenance of adequate nutrition will improve Outcome: Not Progressing Goal: Progress toward achieving an optimal weight will improve Outcome: Not Progressing   Problem: Tissue Perfusion: Goal: Adequacy of tissue perfusion will improve Outcome: Not Progressing   Problem: Education: Goal: Knowledge of disease or condition will improve Outcome: Not Progressing Goal: Knowledge of secondary prevention will improve (MUST DOCUMENT ALL) Outcome: Not Progressing Goal: Knowledge of patient specific risk  factors will improve Loraine Leriche N/A or DELETE if not current risk factor) Outcome: Not Progressing   Problem: Ischemic Stroke/TIA Tissue Perfusion: Goal: Complications of ischemic stroke/TIA will be minimized Outcome: Not Progressing   Problem: Coping: Goal: Will verbalize positive feelings about self Outcome: Not Progressing Goal: Will identify appropriate support needs Outcome: Not Progressing   Problem: Health Behavior/Discharge Planning: Goal: Ability to manage health-related needs will improve Outcome: Not Progressing  Goal: Goals will be collaboratively established with patient/family Outcome: Not Progressing   Problem: Self-Care: Goal: Ability to participate in self-care as condition permits will improve Outcome: Not Progressing Goal: Verbalization of feelings and concerns over difficulty with self-care will improve Outcome: Not Progressing Goal: Ability to communicate needs accurately will improve Outcome: Not Progressing   Problem: Nutrition: Goal: Risk of aspiration will decrease Outcome: Not Progressing Goal: Dietary intake will improve Outcome: Not Progressing Patient total care unable to make needs known only responds to pain in BLE extubated to comfort care at 2125 family at bedside

## 2023-02-07 NOTE — Progress Notes (Signed)
STROKE TEAM PROGRESS NOTE   INTERVAL HISTORY  Patient remains intubated and sedated.  Neurological exam is poor with neck deviated to 1 side and with eyes open but not tracking or following commands.  With spastic left hemiplegia.  He has right upper extremity plegia but withdraws right lower extremity to pain. MRI scan was brain is motion degraded but shows expected evolutionary changes in the left cerebellar and right greater than left subcortical infarcts.  There is a large remote age right MCA infarct as well as left cerebellar infarct. Vitals:   02/07/23 1200 02/07/23 1201 02/07/23 1300 02/07/23 1400  BP: (!) 159/92  (!) 161/84 136/74  Pulse: 99 93 (!) 103 94  Resp: 13 (!) 24 16 (!) 9  Temp: 98.6 F (37 C)     TempSrc: Axillary     SpO2: 100% 98% 96% 99%  Weight:      Height:       CBC:  Recent Labs  Lab 02/06/23 0818 02/07/23 0026  WBC 7.4 6.4  HGB 9.7* 9.2*  HCT 30.6* 29.2*  MCV 80.1 81.1  PLT 297 326   Basic Metabolic Panel:  Recent Labs  Lab 02/04/23 1812 02/05/23 0645 02/05/23 1603 02/06/23 0818 02/07/23 0026  NA 137 140   < > 140 138  K 3.1* 2.9*   < > 3.5 3.5  CL 107 108   < > 108 107  CO2 20* 23   < > 24 23  GLUCOSE 179* 96   < > 100* 113*  BUN 11 8   < > 9 10  CREATININE 0.75 0.61   < > 0.57* 0.48*  CALCIUM 7.2* 7.4*   < > 7.6* 7.4*  MG 1.7 1.7  --   --   --   PHOS 3.0 2.0*  --   --   --    < > = values in this interval not displayed.   Lipid Panel:  Recent Labs  Lab 02/05/23 0645 02/06/23 0818  CHOL 54  --   TRIG 54 46  HDL 20*  --   CHOLHDL 2.7  --   VLDL 11  --   LDLCALC 23  --    HgbA1c:  Recent Labs  Lab 02/02/23 0219  HGBA1C 5.1   Urine Drug Screen:  Recent Labs  Lab 02/02/23 0227  LABOPIA NONE DETECTED  COCAINSCRNUR NONE DETECTED  LABBENZ NONE DETECTED  AMPHETMU NONE DETECTED  THCU NONE DETECTED  LABBARB NONE DETECTED    Alcohol Level  Recent Labs  Lab 02/02/23 1036  ETH <10    IMAGING past 24 hours VAS [place]  UPPER EXTREMITY VENOUS DUPLEX  Result Date: 02/07/2023 UPPER VENOUS STUDY  Patient Name:  RAMON GRUBERT  Date of Exam:   02/07/2023 Medical Rec #: [id number]        Accession #:    [id number] Date of Birth: [birth year]       Patient Gender: M Patient Age:   52 years Exam Location:  [facility] Procedure:      VAS [place] UPPER EXTREMITY VENOUS DUPLEX Referring Phys: Chilton Greathouse --------------------------------------------------------------------------------  Indications: Edema Limitations: Ventilation, contraction, bandages and line. Comparison Study: No prior study Performing Technologist: Sherren Kerns RVS  Examination Guidelines: A complete evaluation includes B-mode imaging, spectral Doppler, color Doppler, and power Doppler as needed of all accessible portions of each vessel. Bilateral testing is considered an integral part of a complete examination. Limited examinations for reoccurring indications may be performed as noted.  Right  Findings: +----------+------------+---------+-----------+----------+--------------------+ RIGHT     CompressiblePhasicitySpontaneousProperties      Summary        +----------+------------+---------+-----------+----------+--------------------+ IJV                                                    Not visualized    +----------+------------+---------+-----------+----------+--------------------+ Subclavian    Full       Yes       Yes                                   +----------+------------+---------+-----------+----------+--------------------+ Axillary      Full       Yes       Yes                                   +----------+------------+---------+-----------+----------+--------------------+ Brachial      Full                                                       +----------+------------+---------+-----------+----------+--------------------+ Radial        Full                                                        +----------+------------+---------+-----------+----------+--------------------+ Ulnar         Full                                                       +----------+------------+---------+-----------+----------+--------------------+ Cephalic    Partial                                  acute in proximal                                                             forearm        +----------+------------+---------+-----------+----------+--------------------+ Basilic       Full                                                       +----------+------------+---------+-----------+----------+--------------------+  Left Findings: +----------+------------+---------+-----------+----------+-------+ LEFT      CompressiblePhasicitySpontaneousPropertiesSummary +----------+------------+---------+-----------+----------+-------+ Subclavian    Full       Yes       Yes                      +----------+------------+---------+-----------+----------+-------+  Summary:  Right: No evidence of deep vein thrombosis in the upper extremity. Findings consistent with acute superficial vein thrombosis involving the right cephalic vein at IV site.  Left: No evidence of thrombosis in the subclavian.  *See table(s) above for measurements and observations.  Diagnosing physician: Gerarda Fraction Electronically signed by Gerarda Fraction on 02/07/2023 at 1:32:06 PM.    Final    EEG adult  Result Date: 02/07/2023 Charlsie Quest, MD     02/07/2023 10:37 AM Patient Name: Payton Newfield MRN: [id number] Epilepsy Attending: Charlsie Quest Referring Physician/Provider: Chilton Greathouse, MD Date: 02/07/2023 Duration: 23.47 mins Patient history: 52yo M s/p cardiac arrest getting eeg to evaluate for seizure. Level of alertness:  lethargic AEDs during EEG study: None Technical aspects: This EEG study was done with scalp electrodes positioned according to the 10-20 International system of electrode placement. Electrical  activity was reviewed with band pass filter of 1-70Hz , sensitivity of 7 uV/mm, display speed of 44mm/sec with a 60Hz  notched filter applied as appropriate. EEG data were recorded continuously and digitally stored.  Video monitoring was available and reviewed as appropriate. Description: EEG showed continuous generalized 3 to 6 Hz theta-delta slowing. Hyperventilation and photic stimulation were not performed.   ABNORMALITY - Continuous slow, generalized IMPRESSION: This study is suggestive of moderate to severe diffuse encephalopathy, nonspecific etiology. No seizures or epileptiform discharges were seen throughout the recording. Charlsie Quest   DG Chest Port 1 View  Result Date: 02/07/2023 CLINICAL DATA:  Acute respiratory failure. EXAM: PORTABLE CHEST 1 VIEW COMPARISON:  02/05/2023 FINDINGS: Feeding tube tip overlies the level of the stomach. Prior median sternotomy and valve replacement. Heart size is normal. Visualized portions of the lungs are unremarkable. Study quality is degraded by patient body habitus and positioning. IMPRESSION: Feeding tube tip overlies the level of the stomach. Lungs are clear. Electronically Signed   By: Norva Pavlov M.D.   On: 02/07/2023 10:30   MR BRAIN WO CONTRAST  Result Date: 02/06/2023 CLINICAL DATA:  Initial evaluation for mental status change. EXAM: MRI HEAD WITHOUT CONTRAST TECHNIQUE: Multiplanar, multiecho pulse sequences of the brain and surrounding structures were obtained without intravenous contrast. COMPARISON:  Prior MRI from 02/03/2023. FINDINGS: Brain: Examination degraded by motion artifact. Cerebral volume within normal limits. Large remote right MCA territory infarct with associated encephalomalacia, gliosis, and chronic hemosiderin staining again noted. Additional tiny remote left cerebellar infarct noted as well. Previously identified patchy small volume ischemic infarcts involving the right greater than left cerebral hemispheres and left  cerebellum again seen, not significantly changed in size from prior without evidence for significant progression. No evidence for hemorrhagic transformation. No significant regional mass effect. No other new or interval areas of acute infarction. Gray-white matter differentiation otherwise maintained. No acute intracranial hemorrhage. Few small chronic micro hemorrhages noted at the posterior left cerebral hemisphere, stable. No mass lesion or mass effect. Mild left-to-right septal deviation related to the chronic right cerebral encephalomalacia. Ventricular prominence without hydrocephalus, stable. No extra-axial fluid collection. Pituitary gland and suprasellar region within normal limits. Vascular: Loss of normal flow void within the right ICA to the terminus, likely occluded, stable. Attenuated flow voids within the right MCA distribution, in keeping with the chronic right MCA territory infarct. Major intracranial vascular flow voids are otherwise maintained. Skull and upper cervical spine: Craniocervical junction within normal limits. Bone marrow signal intensity grossly normal. No scalp soft tissue abnormality. Sinuses/Orbits: Globes orbital soft tissues demonstrate no acute finding. Paranasal sinuses are clear. Small to  moderate right mastoid effusion. Other: None. IMPRESSION: 1. Motion degraded exam. 2. Normal expected interval evolution of small volume ischemic infarcts involving the right greater than left cerebral hemispheres and left cerebellum, not significantly changed in size from prior. No evidence for hemorrhagic transformation or significant regional mass effect. 3. No other new acute intracranial abnormality. 4. Large remote right MCA territory infarct, with additional tiny remote left cerebellar infarct. 5. Loss of normal flow void within the right ICA to the terminus, likely occluded, stable. Electronically Signed   By: Rise Mu M.D.   On: 02/06/2023 20:32    PHYSICAL EXAM  Temp:   [98.1 F (36.7 C)-99.1 F (37.3 C)] 98.6 F (37 C) (05/20 1200) Pulse Rate:  [69-199] 94 (05/20 1400) Resp:  [9-24] 9 (05/20 1400) BP: (99-165)/(56-112) 136/74 (05/20 1400) SpO2:  [96 %-100 %] 99 % (05/20 1400) FiO2 (%):  [40 %] 40 % (05/20 1201) Weight:  [68.3 kg] 68.3 kg (05/20 0500)   General: Intubated and lightly sedated.  Appears to be critically ill.  Neck deviated to 1 side.  Spastic contractures left hand and both ankles CV: Regular rate and rhythm Neuroexam: He was able to blink to command today.  Nonverbal.   Does not follow gaze he does not track examiner.  Head tilted to the right.  I had to force his eyes open pupils appear equal round reactive.  Bilateral corneals are intact.  No consistent blink to threat bilaterally.   Eyes are midline at baseline.  Weak oculocephalics present. Left upper and lower extremity severe contractures from previous stroke.  No movement to noxious stimuli in the right upper with trace withdrawal in right lower extremity.  ASSESSMENT/PLAN Mr. Ewell Defusco is a 52 y.o. 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 While in the ED at [facility], he had a brief episode of V-fib arrest with ROSC achieved after 5 minutes of CPR.   Stroke: Scattered bilateral acute ischemic infarcts, cardioembolic pattern, etiology unclear, may from NSTEMI, endocarditis, recurrent aortic dissection CT head No acute abnormality.  Chronic right MCA infarct CTA head & neck once pt more stabilized and family desire aggressive care MRI  acute infarcts in right occipital lobe, left inferior cerebellum, bilateral caudate heads and anterior right  frontal lobe US venous BLE no DVT 2D Echo EF 40%.  Left ventricle mildly decreased function, septal hypokinesis, concentric left ventricular hypertrophy May consider TEE if pt improves with family desires aggressive care LDL 23 HgbA1c 5.1 UDS neg  VTE prophylaxis - SCD's/Lovenox  No antithrombotics prior to admission, now on heparin GGT due to thrombus in right popliteal vein.  Vascular surgery consulted. Therapy recommendations: Pending Disposition: Pending, per RN, family would like to watch for several days, if not improving, will lean toward comfort care  Hypertension Aortic dissection AVR  HFrEF  s/p aortic arch and valve replacement EF 40%, concentric left ventricular hypertrophy BP Stable Long-term BP goal normotensive Consider CTA aorta to evaluate aortic dissection once stable and family desire aggressive care  Possible STEMI Episode of V-fib arrest S/p CPR and ROSC achieved after 5 minutes  Cardiology on board Could be due to severe hypokalemia with K < 2.0 On amiodarone  PAD  Right arterial US - 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 Appearance of acute thrombus noted. Reconstitution  distally via collatels with monophasic flow observed in the PTA, DPA, and  Pero A.  Consider CTA aorta to evaluate aortic dissection once stable and family desire aggressive care  Hyperlipidemia Home meds: None LDL 53 goal < 70 on atorvastatin 40 mg Continue statin at discharge  Acute hypoxic respiratory failure Concern for aspiration pneumonia Intubated  On Zosyn Vent management per CCM  Sepsis Colitis with reported melena  UTI On Zosyn On Protonix twice daily Per CCM  Dysphagia OG tube Nutrition consult for tube feeds Feeds infusing Speech therapy when able  Other Stroke Risk Factors ETOH use, alcohol level <10, advised to drink no more than 2 drink(s) a day History of polysubstance abuse Hx stroke/TIA - chronic  large right cerebral hemisphere ischemic infarction with extensive associated encephalomalacia thought to be secondary to substance abuse  Congestive heart failure  Other Active Problems Bipolar-on Depakote, and Lexapro start when able ADHD on Adderall Protein calorie malnutrition  I have personally obtained history,examined this patient, reviewed notes, independently viewed imaging studies, participated in medical decision making and plan of care.ROS completed by me personally and pertinent positives fully documented  I have made any additions or clarifications directly to the above note. Agree with note above.  Patient neurological exam remains quite poor with baseline spastic metaplasia from previous stroke with multiple strokes likely to affect his cognition, balance and ambulation and making possibility of independent living unlikely.  He will likely need prolonged ventilatory support, tracheostomy, PEG tube and 24-hour nursing care and that the best will recover to live in a nursing home.  Long discussion with the father at the bedside as well as with his son who arrived later about his prognosis and family needs time to think about goals of care with likely leaning towards comfort care measures.  Discussed with Dr. Celine Mans critical care medicine. This patient is critically ill and at significant risk of neurological worsening, death and care requires constant monitoring of vital signs, hemodynamics,respiratory and cardiac monitoring, extensive review of multiple databases, frequent neurological assessment, discussion with family, other specialists and medical decision making of high complexity.I have made any additions or clarifications directly to the above note.This critical care time does not reflect procedure time, or teaching time or supervisory time of PA/NP/Med Resident etc but could involve care discussion time.  I spent 30 minutes of neurocritical care time  in the care of  this patient.      Delia Heady, MD Medical Director [facility] Pager: [phone] 02/07/2023 3:10 PM     To contact Stroke Continuity provider, please refer to [email or web address]. After hours, contact General Neurology

## 2023-02-07 NOTE — H&P (Signed)
Reason for Consult: Respiratory failure, ventilator dependence  HPI:  Daniel Arroyo is an 52 y.o. male who was transferred to [facility] on 02/02/23 intubated after brief V-fib arrest. He has a Complex PMH, including a history of polysubstance abuse and CVA with residual RUE deficits, LLE injury/contracture and difficulty ambulating, Type II aortic dissection, bipolar disorder and ADHD.  He was subsequently noted to have an acute ischemic infarcts. He remains ventilator dependent  during the hospitalization.  ENT is consulted for possible tracheostomy tube placement.  Past Medical History:  Diagnosis Date   ADHD, adult residual type    Anxiety and depression    Bipolar disorder (HCC)    Depression    Gastroesophageal reflux disease    History of stroke 2018   Reportedly in the setting of substance abuse   Knee pain    Polysubstance abuse (HCC)    Stroke [facility])     Past Surgical History:  Procedure Laterality Date   AORTA SURGERY N/A    TONSILLECTOMY      Family History  Problem Relation Age of Onset   Heart attack Mother        Died in early 82's   HIV Brother        Died in early 58's    Social History:  reports that he has been smoking cigarettes. He has been smoking an average of .5 packs per day. He has never used smokeless tobacco. He reports current alcohol use. He reports that he does not currently use drugs after having used the following drugs: Heroin and Marijuana.  Allergies: No Known Allergies  Prior to Admission medications   Medication Sig Start Date End Date Taking? Authorizing Provider  albuterol (VENTOLIN HFA) 108 (90 Base) MCG/ACT inhaler As directed 01/28/20   [provider]  amphetamine-dextroamphetamine (ADDERALL XR) 30 MG 24 hr capsule Take 30 mg by mouth in the morning and at bedtime. 03/08/19   [provider]  cyclobenzaprine (FLEXERIL) 10 MG tablet Take 10 mg by mouth daily as needed. 03/06/20   [provider]   divalproex (DEPAKOTE) 250 MG DR tablet Take 1 tab in AM, 2 tabs in PM 02/19/22   Van Clines, MD  escitalopram (LEXAPRO) 10 MG tablet Take 10 mg by mouth daily. 02/14/20   [provider]  fluticasone (FLONASE) 50 MCG/ACT nasal spray Place 1 spray into both nostrils daily. 12/28/19   [provider]  furosemide (LASIX) 20 MG tablet TAKE 1 TABLET BY MOUTH DAILY 11/29/19   Jonelle Sidle, MD  metoprolol tartrate (LOPRESSOR) 25 MG tablet Take 2 tablets by mouth 2 (two) times daily.    [provider]  mirtazapine (REMERON) 15 MG tablet Take 1 tablet (15 mg total) by mouth at bedtime. 02/21/17   Oneta Rack, NP  mirtazapine (REMERON) 30 MG tablet Take 30 mg by mouth at bedtime. 08/09/20   [provider]  omeprazole (PRILOSEC) 20 MG capsule Take 20 mg by mouth daily.    [provider]  tadalafil (CIALIS) 20 MG tablet Take 20 mg by mouth daily. 05/02/20   [provider]  temazepam (RESTORIL) 15 MG capsule Take 1 capsule by mouth at bedtime. 03/05/20   [provider]    Medications: I have reviewed the patient's current medications. Scheduled:  amLODipine  5 mg Per Tube Daily   amphetamine-dextroamphetamine  10 mg Per Tube BID   atorvastatin  40 mg Per Tube Daily   Chlorhexidine Gluconate Cloth  6 each Topical Daily   docusate  100 mg Per Tube BID   erythromycin   Both Eyes Q6H   feeding supplement (PROSource TF20)  60 mL Per Tube Daily   folic acid  1 mg Per Tube Daily   Gerhardt's butt cream   Topical BID   multivitamin with minerals  1 tablet Per Tube Daily   mouth rinse  15 mL Mouth Rinse Q2H   pantoprazole (PROTONIX) IV  40 mg Intravenous QHS   polyethylene glycol  17 g Per Tube Daily   potassium & sodium phosphates  1 packet Per Tube TID   sodium chloride flush  10-40 mL Intracatheter Q12H   thiamine  100 mg Per Tube Daily   Continuous:  sodium chloride 10 mL/hr at 02/07/23 1000   ampicillin-sulbactam (UNASYN) IV 3  g (02/07/23 1015)   feeding supplement (VITAL 1.5 CAL) 1,000 mL (02/07/23 1023)   fentaNYL infusion INTRAVENOUS 100 mcg/hr (02/07/23 1000)   heparin 1,300 Units/hr (02/07/23 1000)   ZOX:WRUEAV chloride, acetaminophen, albuterol, fentaNYL (SUBLIMAZE) injection, hydrALAZINE, metoprolol tartrate, midazolam, mouth rinse, sodium chloride flush  Results for orders placed or performed during the hospital encounter of 02/02/23 (from the past 48 hour(s))  Glucose, capillary     Status: Abnormal   Collection Time: 02/05/23 11:36 AM  Result Value Ref Range   Glucose-Capillary 106 (H) 70 - 99 mg/dL    Comment: Glucose reference range applies only to samples taken after fasting for at least 8 hours.  Glucose, capillary     Status: Abnormal   Collection Time: 02/05/23  3:03 PM  Result Value Ref Range   Glucose-Capillary 118 (H) 70 - 99 mg/dL    Comment: Glucose reference range applies only to samples taken after fasting for at least 8 hours.  Basic metabolic panel     Status: Abnormal   Collection Time: 02/05/23  4:03 PM  Result Value Ref Range   Sodium 138 135 - 145 mmol/L   Potassium 3.5 3.5 - 5.1 mmol/L   Chloride 110 98 - 111 mmol/L   CO2 22 22 - 32 mmol/L   Glucose, Bld 121 (H) 70 - 99 mg/dL    Comment: Glucose reference range applies only to samples taken after fasting for at least 8 hours.   BUN 8 6 - 20 mg/dL   Creatinine, Ser 4.09 (L) 0.61 - 1.24 mg/dL   Calcium 7.2 (L) 8.9 - 10.3 mg/dL   GFR, Estimated >81 >19 mL/min    Comment: (NOTE) Calculated using the CKD-EPI Creatinine Equation (2021)    Anion gap 6 5 - 15    Comment: Performed at [facility], [address], [place], [place] [address]  Glucose, capillary     Status: Abnormal   Collection Time: 02/05/23  7:16 PM  Result Value Ref Range   Glucose-Capillary 116 (H) 70 - 99 mg/dL    Comment: Glucose reference range applies only to samples taken after fasting for at least 8 hours.  Heparin level (unfractionated)      Status: Abnormal   Collection Time: 02/05/23  7:57 PM  Result Value Ref Range   Heparin Unfractionated 0.16 (L) 0.30 - 0.70 IU/mL    Comment: (NOTE) The clinical reportable range upper limit is being lowered to >1.10 to align with the FDA approved guidance for the current laboratory assay.  If heparin results are below expected values, and patient dosage has  been confirmed, suggest follow up testing of antithrombin III levels. Performed at  [facility], [address] [place] [address] [place], [place] [address]   Glucose, capillary     Status: Abnormal   Collection Time: 02/05/23 11:17 PM  Result Value Ref Range   Glucose-Capillary 120 (H) 70 - 99 mg/dL    Comment: Glucose reference range applies only to samples taken after fasting for at least 8 hours.  Glucose, capillary     Status: None   Collection Time: 02/06/23  3:14 AM  Result Value Ref Range   Glucose-Capillary 99 70 - 99 mg/dL    Comment: Glucose reference range applies only to samples taken after fasting for at least 8 hours.  Glucose, capillary     Status: None   Collection Time: 02/06/23  7:06 AM  Result Value Ref Range   Glucose-Capillary 75 70 - 99 mg/dL    Comment: Glucose reference range applies only to samples taken after fasting for at least 8 hours.  Basic metabolic panel     Status: Abnormal   Collection Time: 02/06/23  8:18 AM  Result Value Ref Range   Sodium 140 135 - 145 mmol/L   Potassium 3.5 3.5 - 5.1 mmol/L   Chloride 108 98 - 111 mmol/L   CO2 24 22 - 32 mmol/L   Glucose, Bld 100 (H) 70 - 99 mg/dL    Comment: Glucose reference range applies only to samples taken after fasting for at least 8 hours.   BUN 9 6 - 20 mg/dL   Creatinine, Ser 1.91 (L) 0.61 - 1.24 mg/dL   Calcium 7.6 (L) 8.9 - 10.3 mg/dL   GFR, Estimated >47 >82 mL/min    Comment: (NOTE) Calculated using the CKD-EPI Creatinine Equation (2021)    Anion gap 8 5 - 15    Comment: Performed at [facility], [address], [place], [place]  [address]  Triglycerides     Status: None   Collection Time: 02/06/23  8:18 AM  Result Value Ref Range   Triglycerides 46 <150 mg/dL    Comment: Performed at [facility], [address], [place], [place] [address]  CBC     Status: Abnormal   Collection Time: 02/06/23  8:18 AM  Result Value Ref Range   WBC 7.4 4.0 - 10.5 K/uL   RBC 3.82 (L) 4.22 - 5.81 MIL/uL   Hemoglobin 9.7 (L) 13.0 - 17.0 g/dL   HCT 78.4 (L) 69.6 - 29.5 %   MCV 80.1 80.0 - 100.0 fL   MCH 25.4 (L) 26.0 - 34.0 pg   MCHC 31.7 30.0 - 36.0 g/dL   RDW 28.4 (H) 13.2 - 44.0 %   Platelets 297 150 - 400 K/uL   nRBC 0.0 0.0 - 0.2 %    Comment: Performed at [facility], [address] [place], [place] [address]  Heparin level (unfractionated)     Status: Abnormal   Collection Time: 02/06/23  8:18 AM  Result Value Ref Range   Heparin Unfractionated 0.15 (L) 0.30 - 0.70 IU/mL    Comment: (NOTE) The clinical reportable range upper limit is being lowered to >1.10 to align with the FDA approved guidance for the current laboratory assay.  If heparin results are below expected values, and patient dosage has  been confirmed, suggest follow up testing of antithrombin III levels. Performed at [facility], [address], [place], [place] [address]   Glucose, capillary     Status: None   Collection Time: 02/06/23 11:22 AM  Result Value  Ref Range   Glucose-Capillary 75 70 - 99 mg/dL    Comment: Glucose reference range applies only to samples taken after fasting for at least 8 hours.  Glucose, capillary     Status: Abnormal   Collection Time: 02/06/23  3:09 PM  Result Value Ref Range   Glucose-Capillary 58 (L) 70 - 99 mg/dL    Comment: Glucose reference range applies only to samples taken after fasting for at least 8 hours.  Glucose, capillary     Status: None   Collection Time: 02/06/23  3:48 PM  Result Value Ref Range   Glucose-Capillary 98 70 - 99 mg/dL    Comment: Glucose reference range applies only to  samples taken after fasting for at least 8 hours.  Glucose, capillary     Status: None   Collection Time: 02/06/23  7:29 PM  Result Value Ref Range   Glucose-Capillary 84 70 - 99 mg/dL    Comment: Glucose reference range applies only to samples taken after fasting for at least 8 hours.  Heparin level (unfractionated)     Status: None   Collection Time: 02/06/23  7:36 PM  Result Value Ref Range   Heparin Unfractionated 0.61 0.30 - 0.70 IU/mL    Comment: (NOTE) The clinical reportable range upper limit is being lowered to >1.10 to align with the FDA approved guidance for the current laboratory assay.  If heparin results are below expected values, and patient dosage has  been confirmed, suggest follow up testing of antithrombin III levels. Performed at [facility], [address] [place], [place] [address]   Glucose, capillary     Status: Abnormal   Collection Time: 02/06/23 11:24 PM  Result Value Ref Range   Glucose-Capillary 104 (H) 70 - 99 mg/dL    Comment: Glucose reference range applies only to samples taken after fasting for at least 8 hours.  Basic metabolic panel     Status: Abnormal   Collection Time: 02/07/23 12:26 AM  Result Value Ref Range   Sodium 138 135 - 145 mmol/L   Potassium 3.5 3.5 - 5.1 mmol/L   Chloride 107 98 - 111 mmol/L   CO2 23 22 - 32 mmol/L   Glucose, Bld 113 (H) 70 - 99 mg/dL    Comment: Glucose reference range applies only to samples taken after fasting for at least 8 hours.   BUN 10 6 - 20 mg/dL   Creatinine, Ser 4.78 (L) 0.61 - 1.24 mg/dL   Calcium 7.4 (L) 8.9 - 10.3 mg/dL   GFR, Estimated >29 >56 mL/min    Comment: (NOTE) Calculated using the CKD-EPI Creatinine Equation (2021)    Anion gap 8 5 - 15    Comment: Performed at [facility], [address] [place], [place] [address]  CBC     Status: Abnormal   Collection Time: 02/07/23 12:26 AM  Result Value Ref Range   WBC 6.4 4.0 - 10.5 K/uL   RBC 3.60 (L) 4.22 - 5.81 MIL/uL    Hemoglobin 9.2 (L) 13.0 - 17.0 g/dL   HCT 65.7 (L) 84.6 - 96.2 %   MCV 81.1 80.0 - 100.0 fL   MCH 25.6 (L) 26.0 - 34.0 pg   MCHC 31.5 30.0 - 36.0 g/dL   RDW 95.2 (H) 84.1 - 32.4 %   Platelets 326 150 - 400 K/uL   nRBC 0.0 0.0 - 0.2 %    Comment: Performed at [facility], [address], [place],  [place] [address]  Heparin level (unfractionated)     Status: Abnormal   Collection Time: 02/07/23 12:26 AM  Result Value Ref Range   Heparin Unfractionated 0.13 (L) 0.30 - 0.70 IU/mL    Comment: (NOTE) The clinical reportable range upper limit is being lowered to >1.10 to align with the FDA approved guidance for the current laboratory assay.  If heparin results are below expected values, and patient dosage has  been confirmed, suggest follow up testing of antithrombin III levels. Performed at [facility], [address], [place], [place] [address]   Glucose, capillary     Status: Abnormal   Collection Time: 02/07/23  3:25 AM  Result Value Ref Range   Glucose-Capillary 125 (H) 70 - 99 mg/dL    Comment: Glucose reference range applies only to samples taken after fasting for at least 8 hours.  Heparin level (unfractionated)     Status: Abnormal   Collection Time: 02/07/23  6:29 AM  Result Value Ref Range   Heparin Unfractionated 0.12 (L) 0.30 - 0.70 IU/mL    Comment: (NOTE) The clinical reportable range upper limit is being lowered to >1.10 to align with the FDA approved guidance for the current laboratory assay.  If heparin results are below expected values, and patient dosage has  been confirmed, suggest follow up testing of antithrombin III levels. Performed at [facility], [address], [place], [place] [address]   Glucose, capillary     Status: None   Collection Time: 02/07/23  8:06 AM  Result Value Ref Range   Glucose-Capillary 90 70 - 99 mg/dL    Comment: Glucose reference range applies only to samples taken after fasting for at least 8 hours.     EEG adult  Result Date: 02/07/2023 Charlsie Quest, MD     02/07/2023 10:37 AM Patient Name: Camiren Dorward MRN: [id number] Epilepsy Attending: Charlsie Quest Referring Physician/Provider: Chilton Greathouse, MD Date: 02/07/2023 Duration: 23.47 mins Patient history: 52yo M s/p cardiac arrest getting eeg to evaluate for seizure. Level of alertness:  lethargic AEDs during EEG study: None Technical aspects: This EEG study was done with scalp electrodes positioned according to the 10-20 International system of electrode placement. Electrical activity was reviewed with band pass filter of 1-70Hz , sensitivity of 7 uV/mm, display speed of 67mm/sec with a 60Hz  notched filter applied as appropriate. EEG data were recorded continuously and digitally stored.  Video monitoring was available and reviewed as appropriate. Description: EEG showed continuous generalized 3 to 6 Hz theta-delta slowing. Hyperventilation and photic stimulation were not performed.   ABNORMALITY - Continuous slow, generalized IMPRESSION: This study is suggestive of moderate to severe diffuse encephalopathy, nonspecific etiology. No seizures or epileptiform discharges were seen throughout the recording. Charlsie Quest   DG Chest Port 1 View  Result Date: 02/07/2023 CLINICAL DATA:  Acute respiratory failure. EXAM: PORTABLE CHEST 1 VIEW COMPARISON:  02/05/2023 FINDINGS: Feeding tube tip overlies the level of the stomach. Prior median sternotomy and valve replacement. Heart size is normal. Visualized portions of the lungs are unremarkable. Study quality is degraded by patient body habitus and positioning. IMPRESSION: Feeding tube tip overlies the level of the stomach. Lungs are clear. Electronically Signed   By: Norva Pavlov M.D.   On: 02/07/2023 10:30   MR BRAIN WO CONTRAST  Result Date: 02/06/2023 CLINICAL DATA:  Initial evaluation for mental status change. EXAM: MRI HEAD WITHOUT CONTRAST TECHNIQUE: Multiplanar, multiecho pulse  sequences of the brain and surrounding structures  were obtained without intravenous contrast. COMPARISON:  Prior MRI from 02/03/2023. FINDINGS: Brain: Examination degraded by motion artifact. Cerebral volume within normal limits. Large remote right MCA territory infarct with associated encephalomalacia, gliosis, and chronic hemosiderin staining again noted. Additional tiny remote left cerebellar infarct noted as well. Previously identified patchy small volume ischemic infarcts involving the right greater than left cerebral hemispheres and left cerebellum again seen, not significantly changed in size from prior without evidence for significant progression. No evidence for hemorrhagic transformation. No significant regional mass effect. No other new or interval areas of acute infarction. Gray-white matter differentiation otherwise maintained. No acute intracranial hemorrhage. Few small chronic micro hemorrhages noted at the posterior left cerebral hemisphere, stable. No mass lesion or mass effect. Mild left-to-right septal deviation related to the chronic right cerebral encephalomalacia. Ventricular prominence without hydrocephalus, stable. No extra-axial fluid collection. Pituitary gland and suprasellar region within normal limits. Vascular: Loss of normal flow void within the right ICA to the terminus, likely occluded, stable. Attenuated flow voids within the right MCA distribution, in keeping with the chronic right MCA territory infarct. Major intracranial vascular flow voids are otherwise maintained. Skull and upper cervical spine: Craniocervical junction within normal limits. Bone marrow signal intensity grossly normal. No scalp soft tissue abnormality. Sinuses/Orbits: Globes orbital soft tissues demonstrate no acute finding. Paranasal sinuses are clear. Small to moderate right mastoid effusion. Other: None. IMPRESSION: 1. Motion degraded exam. 2. Normal expected interval evolution of small volume ischemic  infarcts involving the right greater than left cerebral hemispheres and left cerebellum, not significantly changed in size from prior. No evidence for hemorrhagic transformation or significant regional mass effect. 3. No other new acute intracranial abnormality. 4. Large remote right MCA territory infarct, with additional tiny remote left cerebellar infarct. 5. Loss of normal flow void within the right ICA to the terminus, likely occluded, stable. Electronically Signed   By: Rise Mu M.D.   On: 02/06/2023 20:32    Review of systems: Unable to obtain   Blood pressure (!) 146/75, pulse 91, temperature 99.1 F (37.3 C), temperature source Oral, resp. rate 11, height 6' 0.01" (1.829 m), weight 68.3 kg, SpO2 100 %. General appearance: Intubated but responsive.   Assessment/Plan: Acute hypoxic respiratory failure, with likely aspiration pneumonia.  The patient also has a recent acute ischemic infarct, with acute encephalopathy. -  The patient has been ventilator dependent since admission. -  Based on the above findings, the patient may benefit from a tracheostomy tube placement.   Asif Muchow W Shiquita Collignon 02/07/2023, 11:29 AM

## 2023-02-07 NOTE — Progress Notes (Addendum)
ANTICOAGULATION CONSULT NOTE - Followup Consult  Pharmacy Consult for Heparin Indication: DVT  No Known Allergies  Patient Measurements: Height: 6' 0.01" (182.9 cm) Weight: 68.3 kg (150 lb 9.2 oz) IBW/kg (Calculated) : 77.62 Heparin Dosing Weight: 66 kg  Vital Signs: Temp: 98.6 F (37 C) (05/20 1200) Temp Source: Axillary (05/20 1200) BP: 136/74 (05/20 1400) Pulse Rate: 94 (05/20 1400)  Labs: Recent Labs    02/05/23 0645 02/05/23 1603 02/05/23 1957 02/06/23 0818 02/06/23 1936 02/07/23 0026 02/07/23 0629 02/07/23 1438  HGB 10.5*  --   --  9.7*  --  9.2*  --   --   HCT 32.4*  --   --  30.6*  --  29.2*  --   --   PLT 257  --   --  297  --  326  --   --   HEPARINUNFRC  --   --    < > 0.15*   < > 0.13* 0.12* 0.13*  CREATININE 0.61 0.58*  --  0.57*  --  0.48*  --   --    < > = values in this interval not displayed.     Estimated Creatinine Clearance: 103.2 mL/min (A) (by C-G formula based on SCr of 0.48 mg/dL (L)).  Assessment: 52 years of age male admitted from [facility] ED with melena and then Vfib arrested. Transferred to [facility]. Found to have age-indeterminate right popliteal DVT and scattered BL acute CVA. 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 Pt found to have superficial thrombus of R cephalic vein as well. Pharmacy consulted for IV Heparin drip- no bolus and low goal of 0.3 to 0.5 in setting of recent melena.    No further melena - notably, brown bowel movement. No issues with line and no s/sx bleeding per RN  HL 0.13 - subtherapeutic   Goal of Therapy:  Heparin level 0.3 to 0.5 units/ml Monitor platelets by anticoagulation protocol: Yes   Plan:   Increase heparin to 1450 units/hr  -6hr HL at  2200 Monitor daily heparin level, CBC Monitor for signs/symptoms of bleeding  Goals of care conversations are ongoing   Calton Dach, PharmD Clinical Pharmacist 02/07/2023 3:16 PM

## 2023-02-07 NOTE — Progress Notes (Addendum)
1915 patient on vent orders to stop fentanyl and start dilaudid per day RN patient top be extubated to comfort care family at bedside very emotional with lots of questions regarding comfort care and re-intubation.  1927 Dilaudid started and fentanyl decreased family at bedside aware and agreeable with plan of care  2005 Fentanyl stopped and dilaudid increased 2125 patient extubated to comfort care all medications and things held that have not been discontinued in [facility] since patient is comfort care one way extubation completed. Cor pack removed with ETT tube per family request.  2340 Family at bedside patient seems comfortable remains on 2 mg of dilaudid

## 2023-02-07 NOTE — Progress Notes (Signed)
EEG complete - results pending 

## 2023-02-07 NOTE — Progress Notes (Signed)
eLink Physician-Brief Progress Note Patient Name: Daniel Arroyo DOB: 07-26-1971 MRN: [id number]   Date of Service  02/07/2023  HPI/Events of Note  K 3.5  On Phos-Nak TID  eICU Interventions  Repleted      Intervention Category Intermediate Interventions: Electrolyte abnormality - evaluation and management  Gordon Vandunk Mechele Collin 02/07/2023, 5:07 AM

## 2023-02-07 NOTE — Progress Notes (Signed)
VASCULAR LAB    Left upper extremity venous duplex has been performed.  See CV proc for preliminary results.   Jaremy Nosal, RVT 02/07/2023, 10:05 AM

## 2023-02-07 NOTE — IPAL (Signed)
  Interdisciplinary Goals of Care Family Meeting   Date carried out: 02/07/2023  Location of the meeting: Conference room  Member's involved: Physician, Bedside Registered Nurse, and Family Member or next of kin  Durable Power of Attorney or acting medical decision maker: Devon  Discussion: We discussed goals of care for [employer] .  Present were his two sons, one of his son's mother, son's fiance and patient's father. They report that since his stroke 6 years ago Ron had been depressed about his quality of life and limited mobility. He had even threatened suicide and involuntary hold regarding suicide attempt. He would not want to have the QOL he would most likely have (bed bound, SNF, possibly vent dependent,) if he survived this hospital stay. Likely family is leading away from tracheostomy at this point and will likely pursue comfort measures. I will touch base with them about this again tomorrow.   Code status:   Code Status: DNR   Disposition: Continue current acute care  Time spent for the meeting: additional cc time 25 minutes    Charlott Holler, MD  02/07/2023, 3:05 PM

## 2023-02-07 NOTE — Progress Notes (Signed)
Patient's family informed nursing that they are ready to transition to comfort measures. Comfort measures orderset placed.

## 2023-02-07 NOTE — Procedures (Signed)
Extubation Procedure Note  Patient Details:   Name: Daniel Arroyo DOB: January 06, 1971 MRN: [id number]   Airway Documentation:    Vent end date: 02/07/23 Vent end time: 2125   Evaluation  O2 sats: stable throughout Complications: No apparent complications Patient did tolerate procedure well. Bilateral Breath Sounds: Diminished   No  Pt extubated per order for comfort care measures.  Hillis Range 02/07/2023, 9:33 PM

## 2023-02-07 NOTE — Progress Notes (Signed)
ANTICOAGULATION CONSULT NOTE - Followup Consult  Pharmacy Consult for Heparin Indication: DVT  No Known Allergies  Patient Measurements: Height: 6' 0.01" (182.9 cm) Weight: 68.3 kg (150 lb 9.2 oz) IBW/kg (Calculated) : 77.62 Heparin Dosing Weight: 66 kg  Vital Signs: Temp: 98.1 F (36.7 C) (05/20 0328) Temp Source: Axillary (05/20 0328) BP: 119/65 (05/20 0200) Pulse Rate: 82 (05/20 0200)  Labs: Recent Labs    02/05/23 0645 02/05/23 1603 02/05/23 1957 02/06/23 0818 02/06/23 1936 02/07/23 0026 02/07/23 0629  HGB 10.5*  --   --  9.7*  --  9.2*  --   HCT 32.4*  --   --  30.6*  --  29.2*  --   PLT 257  --   --  297  --  326  --   HEPARINUNFRC  --   --    < > 0.15* 0.61 0.13* 0.12*  CREATININE 0.61 0.58*  --  0.57*  --  0.48*  --    < > = values in this interval not displayed.     Estimated Creatinine Clearance: 103.2 mL/min (A) (by C-G formula based on SCr of 0.48 mg/dL (L)).  Assessment: 52 years of age male admitted from [facility] ED with melena and then Vfib arrested. Transferred to [facility]. Found to have age-indeterminate right popliteal DVT and scattered BL acute CVA. Note patient with hx of aortic dissection s/p valve and arch replacement. Vascular consulted with no plan for surgery. Pharmacy consulted for IV Heparin drip- no bolus and low goal of 0.3 to 0.5 in setting of recent melena.    No further melena per RN.  HL 0.13 - subtherapeutic   Goal of Therapy:  Heparin level 0.3 to 0.5 units/ml Monitor platelets by anticoagulation protocol: Yes   Plan:   Increase heparin back to 1300 units/hr  -6hr HL at 1400  Monitor daily heparin level, CBC Monitor for signs/symptoms of bleeding   Calton Dach, PharmD Clinical Pharmacist 02/07/2023 7:53 AM

## 2023-02-07 NOTE — Progress Notes (Addendum)
NAME:  Daniel Arroyo, MRN:  [id number], DOB:  1971-02-13, LOS: 5 ADMISSION DATE:  02/02/2023, CONSULTATION DATE:  02/07/23 REFERRING MD:  EDP, CHIEF COMPLAINT:  cardiac arrest   History of Present Illness:   Daniel Arroyo is a 52 y.o. M with PMH significant for polysubstance abuse and CVA with residual RUE deficits, LLE  injury for which never sought treatment for which never sought treatment and subsequent foot contracture and difficulty ambulating, Type II aortic dissection, bipolar disorder and ADHD who initially presented to [facility] [place] with three days of abdominal pain dark stools.   His potassium was markedly low at 1.6, Hgb WNL. He was treated with octreotide, pantoprazole, antibiotics, and given potassium for his hypokalemia. An abdominal CT showed colitis. He also was found to have aortic dissection which was previously known and unchanged from his previous CT. He then experienced a V-fib cardiac arrest and had CPR for 5 minutes with standard ACLS. He was intubated and briefly required pressors.  CXR with RLL infiltrate. The ED provider spoke with TCTS regarding dissection as follows "Given his history of type II aortic dissection a CT was done of his chest, now showing a type I aortic dissection. The case was discussed with cardio thoracic surgery who found in his records that he has had an aortic arch and valve replacement and therefore is not a surgical candidate. He should be treated as a type II / B dissection"  He was transported to [facility] and arrived hemodynamically stable but not responsive with sedation wean.   Per his father who is at the bedside, pt lives alone and he thinks has been smoking marijuana.  He is unsure about other drugs, does not think he has been drinking a significant amount of alcohol.  Pt is minimally able to care for himself, his father was checking on him 1-2x per day until a couple of weeks ago when pt was verbally abusive, so he stopped coming and has not  seen him recently until today   Pertinent  Medical History   has a past medical history of ADHD, adult residual type, Anxiety and depression, Bipolar disorder (HCC), Depression, Gastroesophageal reflux disease, History of stroke (2018), Knee pain, Polysubstance abuse (HCC), and Stroke (HCC).   Significant Hospital Events: Including procedures, antibiotic start and stop dates in addition to other pertinent events   5/15 transferred from [facility] intubated after brief V-fib arrest, not on pressors  5/16 Echo LVEF 40%; not able to evaluate regional wall motion 5/17 Neurology consulted MRI showing acute ischemic infarcts, made DNR 5/18 Started anticoagulation for right popliteal blood clot. Vascular surgery consulted.  Interim History / Subjective:   This morning wiggles toes to command. Not on pressors. On 100 mcg fentanyl. No further melena. UOP is ok.   Objective   Blood pressure 119/65, pulse 82, temperature 98.1 F (36.7 C), temperature source Axillary, resp. rate 12, height 6' 0.01" (1.829 m), weight 68.3 kg, SpO2 100 %.    Vent Mode: PRVC FiO2 (%):  [40 %] 40 % Set Rate:  [12 bmp] 12 bmp Vt Set:  [550 mL] 550 mL PEEP:  [5 cmH20] 5 cmH20 Pressure Support:  [5 cmH20] 5 cmH20 Plateau Pressure:  [12 cmH20] 12 cmH20   Intake/Output Summary (Last 24 hours) at 02/07/2023 0750 Last data filed at 02/07/2023 0600 Gross per 24 hour  Intake 2080.11 ml  Output 150 ml  Net 1930.11 ml   Filed Weights   02/03/23 0500 02/05/23 0414 02/07/23 0500  Weight: 68.7 kg 66.3 kg 68.3 kg   Gen:      Intubated, sedated, acutely and chronically ill appearing HEENT:  ETT to vent Lungs:    sounds of mechanical ventilation auscultated no wheeze CV:         RRR Abd:      + bowel sounds; soft, non-tender; no palpable masses, no distension Ext:    LLE and LLE contractures Skin:      Warm and dry; no rashes Neuro:   sedated, RASS -1, intermittently follows commands very weakly with wiggling toes (did not  for me but did for nurse)  Labs/imaging reviewed Cr 0.48 Na 138 K 3.5 Hgb 9.2, Platlets 326 WBC 6.4  MRI brain 5/19 reviewed large remote right MCA infarct, multiple small volume ischemic infarcts R>L unchanged from 5/16  Resolved Hospital Problem list     Assessment & Plan:   Acute Encephalopathy Acute multifocal CVA - MRI 5/17 showing multifocal acute infarcts Hx CVA: w/ RUE deficits; Baseline L hemiplegia and poor mobility Hx of polysubstance abuse and alcohol use P: -concern for anoxic injury post arrest -Minimize sedation, currently only on fentanyl -thiamine, folic acid, mvi -PT/OT when appropriate  Witnessed in hospital V-fib arrest likely secondary to electrolyte abnormalities  Approximately 5 minute code in the ED Hypokalemia Hypomagnesemia  Hypocalcemia P: Continue telemetry monitoring, replete electrolytes Cardiology consulted Improved overall since admission  Acute Hypoxic Respiratory Failure Likely RLL aspiration PNA P: On SBT's but mental status and poor strength are a barriers to extubation. Given underlying weakness would need tracheostomy if we continue aggressive measures.  Continue antibiotics, follow intermittent chest x-ray  Sepsis secondary to Enterococcus UTI Colitis with reported melena with ABLA P: -transition to once daily ppi. Continue Unasyn. Will finish 7 day course which ends 5/21.  - no further frank melena or hematochezia. Cautiously continue heparin gtt and monitor stools/cbc  Type 1 Aortic dissection AVR HTN HFrEF: echo 5/15 ef 40% Sees vascular and cardiology and WF, had ascending arch and proximal hemiarch replacement due to dissection along with bioprosthetic AVR in 08/2020 -scan reviewed by Dr. Leafy Ro, not a candidate for operative intervention P: -continue amlodipine for medical management prn hydralazine - 7 L positive, will diurese with IV lasix today  Acute right popliteal arterial occlusion P: S/p right femoral  arterial line earlier this admission Consulted vascular.  He has been started on heparin drip Monitor CBC as he reportedly had melena on presentation at [facility].  Hemoglobin has been stable so far with no evidence of bleed.  Protein calorie malnutrition POA P: TF per RD  Bipolar disorder, ADHD P: Initially holding home adderall, depakote, remeron, restoril and lexapro - will resume adderal today, will gently fold in other agents as able  Conjunctivitis Erythromycin ointment  Rt arm swelling Ultrasound ordered to evaluate for DVT, pending for today  Goals of Care DNR Daniel Arroyo would like to be the only person given updates and he is the POA per rest of family. Per family meeting with neurology on 5/19 there is MRI and EEG pending and family meeting on 5/20 at 1pm. Would need ENT involvement for surgical tracheostomy given anatomy/contractures.   Best Practice (right click and "Reselect all SmartList Selections" daily)   Diet/type: tubefeeds DVT prophylaxis: systemic heparin GI prophylaxis: PPI Lines: N/A Foley:  N/A Code Status:  DNR Last date of multidisciplinary goals of care discussion [5/20 update Daniel Daniel Arroyo regarding plan of care. He is hoping to give his father every chance  to resume his previous QOL which was living independently, eating, talking, managing his own finances. He just needed help with things like groceries given his chronic left hemiplegia. This would require tracheostomy, PEG tube, likely LTACH. He wants to know if surgical tracheostomy is an option. Continue DNR for now.]  Critical care time:     The patient is critically ill due to respiratory failure.  Critical care was necessary to treat or prevent imminent or life-threatening deterioration.  Critical care was time spent personally by me on the following activities: development of treatment plan with patient and/or surrogate as well as nursing, discussions with consultants, evaluation of patient's  response to treatment, examination of patient, obtaining history from patient or surrogate, ordering and performing treatments and interventions, ordering and review of laboratory studies, ordering and review of radiographic studies, pulse oximetry, re-evaluation of patient's condition and participation in multidisciplinary rounds.   Critical Care Time devoted to patient care services described in this note is 51 minutes. This time reflects time of care of this signee Charlott Holler . This critical care time does not reflect separately billable procedures or procedure time, teaching time or supervisory time of PA/NP/Med student/Med Resident etc but could involve care discussion time.       Mickel Baas Pulmonary and Critical Care Medicine 02/07/2023 7:59 AM  Pager: see AMION  If no response to pager , please call critical care on call (see AMION) until 7pm After 7:00 pm call Elink

## 2023-02-07 NOTE — Progress Notes (Signed)
Chaplain responded to request from pt's nurse to visit with family as they are ready to move pt to comfort care.  Chaplain learned pt had been baptized in the [facility] and has had an on/off relationship with church.  Present in the room were the pt's two sons and their mother (ex-wife).  When asked how I could help the two sons explained, though they are unchurched but they want some sort of ritual for their dad.  Mother agreed.  Chaplain offered prayer with words speaking to pt's faith which is known to God alone; God understands this man's journey; God will be "there" for what comes after life as we know it.  Chaplain prayed for comfort of family.  Both sons were crying.  One left the room immediately.   Chaplain advised the mother I will be here all night if I am needed.  Vernell Morgans Chaplain

## 2023-02-07 NOTE — Procedures (Signed)
Patient Name: Kashan Rivett  MRN: [id number]  Epilepsy Attending: Charlsie Quest  Referring Physician/Provider: Chilton Greathouse, MD  Date: 02/07/2023 Duration: 23.47 mins  Patient history: 52yo M s/p cardiac arrest getting eeg to evaluate for seizure.  Level of alertness:  lethargic   AEDs during EEG study: None  Technical aspects: This EEG study was done with scalp electrodes positioned according to the 10-20 International system of electrode placement. Electrical activity was reviewed with band pass filter of 1-70Hz , sensitivity of 7 uV/mm, display speed of 52mm/sec with a 60Hz  notched filter applied as appropriate. EEG data were recorded continuously and digitally stored.  Video monitoring was available and reviewed as appropriate.  Description: EEG showed continuous generalized 3 to 6 Hz theta-delta slowing. Hyperventilation and photic stimulation were not performed.     ABNORMALITY - Continuous slow, generalized  IMPRESSION: This study is suggestive of moderate to severe diffuse encephalopathy, nonspecific etiology. No seizures or epileptiform discharges were seen throughout the recording.  Uliana Brinker Annabelle Harman

## 2023-02-08 DIAGNOSIS — I639 Cerebral infarction, unspecified: Secondary | ICD-10-CM | POA: Diagnosis not present

## 2023-02-08 NOTE — Progress Notes (Signed)
Pt transferred to 6N. Pt at bedside

## 2023-02-08 NOTE — Progress Notes (Signed)
Heart Failure Navigator Progress Note  Assessed for Heart & Vascular TOC clinic readiness.  Patient does not meet criteria due to patient has been transitioned to comfort care. .   Navigator will sign off at this time.   Rhae Hammock, BSN, Scientist, clinical (histocompatibility and immunogenetics) Only

## 2023-02-08 NOTE — Progress Notes (Signed)
NAME:  Ladavion Longmire, MRN:  [id number], DOB:  27-Jun-1971, LOS: 6 ADMISSION DATE:  02/02/2023, CONSULTATION DATE:  02/08/23 REFERRING MD:  EDP, CHIEF COMPLAINT:  cardiac arrest   History of Present Illness:   Jayz Mau is a 52 y.o. M with PMH significant for polysubstance abuse and CVA with residual RUE deficits, LLE  injury for which never sought treatment for which never sought treatment and subsequent foot contracture and difficulty ambulating, Type II aortic dissection, bipolar disorder and ADHD who initially presented to [facility] [place] with three days of abdominal pain dark stools.   His potassium was markedly low at 1.6, Hgb WNL. He was treated with octreotide, pantoprazole, antibiotics, and given potassium for his hypokalemia. An abdominal CT showed colitis. He also was found to have aortic dissection which was previously known and unchanged from his previous CT. He then experienced a V-fib cardiac arrest and had CPR for 5 minutes with standard ACLS. He was intubated and briefly required pressors.  CXR with RLL infiltrate. The ED provider spoke with TCTS regarding dissection as follows "Given his history of type II aortic dissection a CT was done of his chest, now showing a type I aortic dissection. The case was discussed with cardio thoracic surgery who found in his records that he has had an aortic arch and valve replacement and therefore is not a surgical candidate. He should be treated as a type II / B dissection"  He was transported to [facility] and arrived hemodynamically stable but not responsive with sedation wean.   Per his father who is at the bedside, pt lives alone and he thinks has been smoking marijuana.  He is unsure about other drugs, does not think he has been drinking a significant amount of alcohol.  Pt is minimally able to care for himself, his father was checking on him 1-2x per day until a couple of weeks ago when pt was verbally abusive, so he stopped coming and has not  seen him recently until today   Pertinent  Medical History   has a past medical history of ADHD, adult residual type, Anxiety and depression, Bipolar disorder (HCC), Depression, Gastroesophageal reflux disease, History of stroke (2018), Knee pain, Polysubstance abuse (HCC), and Stroke (HCC).   Significant Hospital Events: Including procedures, antibiotic start and stop dates in addition to other pertinent events   5/15 transferred from [facility] intubated after brief V-fib arrest, not on pressors  5/16 Echo LVEF 40%; not able to evaluate regional wall motion 5/17 Neurology consulted MRI showing acute ischemic infarcts, made DNR 5/18 Started anticoagulation for right popliteal blood clot. Vascular surgery consulted.  Interim History / Subjective:   Palliative Extubation yesterday. Family at bedside  Objective   Blood pressure (!) 146/71, pulse 92, temperature 100 F (37.8 C), temperature source Axillary, resp. rate (!) 8, height 6' 0.01" (1.829 m), weight 68.3 kg, SpO2 (!) 89 %.    Vent Mode: PSV;CPAP FiO2 (%):  [40 %] 40 % PEEP:  [5 cmH20] 5 cmH20 Pressure Support:  [5 cmH20] 5 cmH20   Intake/Output Summary (Last 24 hours) at 02/08/2023 0824 Last data filed at 02/08/2023 0600 Gross per 24 hour  Intake 1300.19 ml  Output 2700 ml  Net -1399.81 ml   Filed Weights   02/03/23 0500 02/05/23 0414 02/07/23 0500  Weight: 68.7 kg 66.3 kg 68.3 kg   Resting comfortably Breathing nonlabored No distress Not responsive or verbal   Resolved Hospital Problem list     Assessment & Plan:  Acute Encephalopathy Acute multifocal CVA - MRI 5/17 showing multifocal acute infarcts Hx CVA: w/ RUE deficits; Baseline L hemiplegia and poor mobility Hx of polysubstance abuse and alcohol use Witnessed in hospital V-fib arrest likely secondary to electrolyte abnormalities  Hypokalemia Hypomagnesemia  Hypocalcemia Acute Hypoxic Respiratory Failure Likely RLL aspiration PNA Sepsis secondary to  Enterococcus UTI Colitis with reported melena with ABLA Type 1 Aortic dissection AVR HTN HFrEF: echo 5/15 ef 40% Acute right popliteal arterial occlusion Protein calorie malnutrition Bipolar disorder, ADHD Conjunctivitis Rt arm swelling Goals of Care  Patient has transitioned to comfort measures. Continue dilaudid gtt to patient comfort. Stopped all non-comfort orders. Will transfer to palliative care floor.   Durel Salts, MD Pulmonary and Critical Care Medicine [facility] 02/08/2023 8:32 AM Pager: see AMION  If no response to pager, please call critical care on call (see AMION) until 7pm After 7:00 pm call Elink

## 2023-02-08 NOTE — TOC Progression Note (Signed)
Transition of Care [facility]) - Progression Note    Patient Details  Name: Daniel Arroyo MRN: [id number] Date of Birth: 1971-04-10  Transition of Care [facility]) CM/SW Contact  Tom-Johnson, Hershal Coria, RN Phone Number: 02/08/2023, 10:46 AM  Clinical Narrative:     GOC meeting held yesterday with MD and family. Patient transitioned to comfort care at this time. CM will continue to follow and render compassionate support at this transitional time.             Expected Discharge Plan and Services                                               Social Determinants of Health (SDOH) Interventions SDOH Screenings   Alcohol Screen: Medium Risk (07/29/2017)  Depression (PHQ2-9): Low Risk  (08/12/2020)  Tobacco Use: High Risk (02/19/2022)    Readmission Risk Interventions     No data to display

## 2023-02-08 NOTE — Progress Notes (Signed)
Nutrition Brief Note  Chart reviewed. Pt has transitioned to comfort care.  No further nutrition interventions planned at this time.  Please re-consult as needed.    Kate Camren Lipsett, MS, RD, LDN Inpatient Clinical Dietitian Please see AMiON for contact information.  

## 2023-02-09 DIAGNOSIS — Z515 Encounter for palliative care: Secondary | ICD-10-CM

## 2023-02-09 MED ORDER — LORAZEPAM 1 MG PO TABS: 1.0000 mg | ORAL_TABLET | ORAL | Status: DC | PRN

## 2023-02-09 MED ORDER — HALOPERIDOL LACTATE 2 MG/ML PO CONC: 0.5000 mg | ORAL | Status: DC | PRN

## 2023-02-09 MED ORDER — HALOPERIDOL 0.5 MG PO TABS: 0.5000 mg | ORAL_TABLET | ORAL | Status: DC | PRN

## 2023-02-09 MED ORDER — LORAZEPAM 2 MG/ML PO CONC: 1.0000 mg | ORAL | Status: DC | PRN

## 2023-02-09 MED ORDER — HALOPERIDOL LACTATE 5 MG/ML IJ SOLN
0.5000 mg | INTRAMUSCULAR | Status: DC | PRN
  Administered 2023-02-09: 0.5 mg via INTRAVENOUS
  Filled 2023-02-09: qty 1

## 2023-02-09 MED ORDER — ONDANSETRON 4 MG PO TBDP: 4.0000 mg | ORAL_TABLET | Freq: Four times a day (QID) | ORAL | Status: DC | PRN

## 2023-02-09 MED ORDER — ONDANSETRON HCL 4 MG/2ML IJ SOLN: 4.0000 mg | Freq: Four times a day (QID) | INTRAMUSCULAR | Status: DC | PRN

## 2023-02-09 MED ORDER — BIOTENE DRY MOUTH MT LIQD: 15.0000 mL | OROMUCOSAL | Status: DC | PRN

## 2023-02-19 NOTE — Progress Notes (Signed)
PRN dilaudid bolus given again as well as robinul IV. Patient also suctioned

## 2023-02-19 NOTE — Progress Notes (Signed)
Family requesting Ativan. Mal Misty, MD made aware.

## 2023-02-19 NOTE — Progress Notes (Signed)
   2023-03-08 1310  Attending Physican Contact  Attending Physician Notified Y  Attending Physician (First and Last Name) Daniel Arroyo  Post Mortem Checklist  Date of Death Mar 08, 2023  Time of Death 1310  Pronounced By Scharlene Corn and Marene Lenz  Next of kin notified Yes  Name of next of kin notified of death Devon Pence  Contact Person's Relationship to Patient Son  Contact Person's Phone Number [phone]  Was the patient a No Code Blue or a Limited Code Blue? Yes  Did the patient die unattended? No  Patient restrained? Not applicable  Height 6\' 1"  (1.854 m)  Weight 68.3 kg  Body preparation complete Y  [employer] (previously known as Administrator, sports)  Notification Date March 08, 2023  Notification Time 1357  [employer] Number [id number] Felipa Eth)  Is patient a potential donor? Y  Donation Type Tissue  Eye prep completed Yes  Autopsy  Autopsy requested by MD or Family ( Non ME Case) N/A  Patient and Hospital Property Returned  Patient is satisfied that all belongings have been returned? Yes  Name of person receiving valuables? Devon Ankney  Notifications  Patient Placement notified that Post Mortem checklist is complete Yes  Patient Placement notified body transferred Transported to [employer]  Is this a medical examiner's case? N  Funeral Home  Funeral home name/address/phone # [employer] - [phone]  Planned location of pickup [place]

## 2023-02-19 NOTE — Progress Notes (Signed)
PRN dilaudid bolus given per family request. Pt resting comfortably in bed. No signs of distress. Will continue to monitor pt

## 2023-02-19 NOTE — Progress Notes (Signed)
0.5mg  of haldol given per family request. Another Dilaudid bolus given as well.

## 2023-02-19 NOTE — Progress Notes (Signed)
Family would like pt to be more comfortable before catheter insertion. Will continue to monitor pt and keep him comfortable

## 2023-02-19 NOTE — Progress Notes (Signed)
Discussed with Dr. Celine Mans who agrees to handle discharge summary and death certificate since patient transferred out of the ICU yesterday in the event Mr. Stolle died today.  Jacquelin Hawking, MD [facility] , 7:10 PM

## 2023-02-19 NOTE — Progress Notes (Signed)
Patient suctioned per family request. Pt tolerated well

## 2023-02-19 NOTE — Discharge Summary (Signed)
DEATH SUMMARY   Patient Details  Name: Daniel Arroyo MRN: [id number] DOB: 05/19/1971  Admission/Discharge Information   Admit Date:  06-Feb-2023  Date of Death: Date of Death: Feb 13, 2023  Time of Death: Time of Death: 1310  Length of Stay: 7  Referring Physician: Salley Slaughter, NP   Reason(s) for Hospitalization  Bloody stools  Diagnoses  Preliminary cause of death: acute ischemic stroke Secondary Diagnoses (including complications and co-morbidities):  Principal Problem:   GIB (gastrointestinal bleeding) Active Problems:   Cardiac arrest (HCC)   Acute respiratory failure (HCC)   Hypokalemia   Hypomagnesemia   Hypocalcemia   Encephalopathy acute   Protein-calorie malnutrition, severe   Comfort measures only status Type A aortic dissection   Brief Hospital Course (including significant findings, care, treatment, and services provided and events leading to death)  Akil Suski wa a 52 y.o. man with PMH significant for polysubstance abuse and CVA with residual RUE deficits, LLE injury for which never sought treatment for which never sought treatment and subsequent foot contracture and difficulty ambulating, Type II aortic dissection, bipolar disorder and ADHD who initially presented to [facility] [place] with three days of abdominal pain dark stools. Found to have colitis and severe hypokalemia. Suffered in hospital v. Fib cardiac arrest due to hypokalemia. Intubated, on pressors, transferred to [facility] for ongoing care. Found to have Sepsis secondary to Enterococcus UTI as well as aspiration pneumonia and Type 1 Aortic dissection not amenable to surgery given his previous aortic surgery and graft placement. Brain imaging showed acute on chronic multifocal infarct. Care complicated by acute right popliteal arterial occlusion, encephalopathy, superficial DVT. Seen by neurology as well as vascular surgery. Given his poor baseline functional status and severity of his multiple medical  problems, he was felt to have poor prognosis from this hospitalization. After family meeting with patient's father and two sons, they elected to proceed with comfort measures only. He passed away peacefully with family at bedside.     Pertinent Labs and Studies  Significant Diagnostic Studies VAS [place] UPPER EXTREMITY VENOUS DUPLEX  Result Date: 02/07/2023 UPPER VENOUS STUDY  Patient Name:  Daniel Arroyo  Date of Exam:   02/07/2023 Medical Rec #: [id number]        Accession #:    [id number] Date of Birth: [birth year]       Patient Gender: M Patient Age:   52 years Exam Location:  [facility] Procedure:      VAS [place] UPPER EXTREMITY VENOUS DUPLEX Referring Phys: Chilton Greathouse --------------------------------------------------------------------------------  Indications: Edema Limitations: Ventilation, contraction, bandages and line. Comparison Study: No prior study Performing Technologist: Sherren Kerns RVS  Examination Guidelines: A complete evaluation includes B-mode imaging, spectral Doppler, color Doppler, and power Doppler as needed of all accessible portions of each vessel. Bilateral testing is considered an integral part of a complete examination. Limited examinations for reoccurring indications may be performed as noted.  Right Findings: +----------+------------+---------+-----------+----------+--------------------+ RIGHT     CompressiblePhasicitySpontaneousProperties      Summary        +----------+------------+---------+-----------+----------+--------------------+ IJV                                                    Not visualized    +----------+------------+---------+-----------+----------+--------------------+ Subclavian    Full       Yes       Yes                                   +----------+------------+---------+-----------+----------+--------------------+  Axillary      Full       Yes       Yes                                    +----------+------------+---------+-----------+----------+--------------------+ Brachial      Full                                                       +----------+------------+---------+-----------+----------+--------------------+ Radial        Full                                                       +----------+------------+---------+-----------+----------+--------------------+ Ulnar         Full                                                       +----------+------------+---------+-----------+----------+--------------------+ Cephalic    Partial                                  acute in proximal                                                             forearm        +----------+------------+---------+-----------+----------+--------------------+ Basilic       Full                                                       +----------+------------+---------+-----------+----------+--------------------+  Left Findings: +----------+------------+---------+-----------+----------+-------+ LEFT      CompressiblePhasicitySpontaneousPropertiesSummary +----------+------------+---------+-----------+----------+-------+ Subclavian    Full       Yes       Yes                      +----------+------------+---------+-----------+----------+-------+  Summary:  Right: No evidence of deep vein thrombosis in the upper extremity. Findings consistent with acute superficial vein thrombosis involving the right cephalic vein at IV site.  Left: No evidence of thrombosis in the subclavian.  *See table(s) above for measurements and observations.  Diagnosing physician: Gerarda Fraction Electronically signed by Gerarda Fraction on 02/07/2023 at 1:32:06 PM.    Final    EEG adult  Result Date: 02/07/2023 Charlsie Quest, MD     02/07/2023 10:37 AM Patient Name: Daniel Arroyo MRN: [id number] Epilepsy Attending: Charlsie Quest Referring Physician/Provider: Chilton Greathouse, MD Date: 02/07/2023  Duration: 23.47 mins Patient history: 52yo M s/p cardiac arrest s/p cardiac arrest getting eeg to evaluate for seizure. Level of alertness:  lethargic AEDs  during EEG study: None Technical aspects: This EEG study was done with scalp electrodes positioned according to the 10-20 International system of electrode placement. Electrical activity was reviewed with band pass filter of 1-70Hz , sensitivity of 7 uV/mm, display speed of 59mm/sec with a 60Hz  notched filter applied as appropriate. EEG data were recorded continuously and digitally stored.  Video monitoring was available and reviewed as appropriate. Description: EEG showed continuous generalized 3 to 6 Hz theta-delta slowing. Hyperventilation and photic stimulation were not performed.   ABNORMALITY - Continuous slow, generalized IMPRESSION: This study is suggestive of moderate to severe diffuse encephalopathy, nonspecific etiology. No seizures or epileptiform discharges were seen throughout the recording. Charlsie Quest   DG Chest Port 1 View  Result Date: 02/07/2023 CLINICAL DATA:  Acute respiratory failure. EXAM: PORTABLE CHEST 1 VIEW COMPARISON:  02/05/2023 FINDINGS: Feeding tube tip overlies the level of the stomach. Prior median sternotomy and valve replacement. Heart size is normal. Visualized portions of the lungs are unremarkable. Study quality is degraded by patient body habitus and positioning. IMPRESSION: Feeding tube tip overlies the level of the stomach. Lungs are clear. Electronically Signed   By: Norva Pavlov M.D.   On: 02/07/2023 10:30   MR BRAIN WO CONTRAST  Result Date: 02/06/2023 CLINICAL DATA:  Initial evaluation for mental status change. EXAM: MRI HEAD WITHOUT CONTRAST TECHNIQUE: Multiplanar, multiecho pulse sequences of the brain and surrounding structures were obtained without intravenous contrast. COMPARISON:  Prior MRI from 02/03/2023. FINDINGS: Brain: Examination degraded by motion artifact. Cerebral volume within normal limits. Large remote  right MCA territory infarct with associated encephalomalacia, gliosis, and chronic hemosiderin staining again noted. Additional tiny remote left cerebellar infarct noted as well. Previously identified patchy small volume ischemic infarcts involving the right greater than left cerebral hemispheres and left cerebellum again seen, not significantly changed in size from prior without evidence for significant progression. No evidence for hemorrhagic transformation. No significant regional mass effect. No other new or interval areas of acute infarction. Gray-white matter differentiation otherwise maintained. No acute intracranial hemorrhage. Few small chronic micro hemorrhages noted at the posterior left cerebral hemisphere, stable. No mass lesion or mass effect. Mild left-to-right septal deviation related to the chronic right cerebral encephalomalacia. Ventricular prominence without hydrocephalus, stable. No extra-axial fluid collection. Pituitary gland and suprasellar region within normal limits. Vascular: Loss of normal flow void within the right ICA to the terminus, likely occluded, stable. Attenuated flow voids within the right MCA distribution, in keeping with the chronic right MCA territory infarct. Major intracranial vascular flow voids are otherwise maintained. Skull and upper cervical spine: Craniocervical junction within normal limits. Bone marrow signal intensity grossly normal. No scalp soft tissue abnormality. Sinuses/Orbits: Globes orbital soft tissues demonstrate no acute finding. Paranasal sinuses are clear. Small to moderate right mastoid effusion. Other: None. IMPRESSION: 1. Motion degraded exam. 2. Normal expected interval evolution of small volume ischemic infarcts involving the right greater than left cerebral hemispheres and left cerebellum, not significantly changed in size from prior. No evidence for hemorrhagic transformation or significant regional mass effect. 3. No other new acute intracranial  abnormality. 4. Large remote right MCA territory infarct, with additional tiny remote left cerebellar infarct. 5. Loss of normal flow void within the right ICA to the terminus, likely occluded, stable. Electronically Signed   By: Rise Mu M.D.   On: 02/06/2023 20:32   VAS [place] LOWER EXTREMITY ARTERIAL DUPLEX  Result Date: 02/05/2023 LOWER EXTREMITY ARTERIAL DUPLEX STUDY Patient Name:  JEANPAUL Wickstrom  Date  of Exam:   02/04/2023 Medical Rec #: [id number]        Accession #:    [id number] Date of Birth: 08/25/1971       Patient Gender: M Patient Age:   46 years Exam Location:  [facility] Procedure:      VAS [place] LOWER EXTREMITY ARTERIAL DUPLEX Referring Phys: Renae Fickle HOFFMAN --------------------------------------------------------------------------------  Indications: Incidental right popliteal artery occlusion noted on DVT study.              Cool right lower extremity with monophasic pulses. Other Factors: Embolic stroke, polysubstance abuse.  Current ABI: N/A Comparison Study: No prior studies. Performing Technologist: Jean Rosenthal RDMS, RVT  Examination Guidelines: A complete evaluation includes B-mode imaging, spectral Doppler, color Doppler, and power Doppler as needed of all accessible portions of each vessel. Bilateral testing is considered an integral part of a complete examination. Limited examinations for reoccurring indications may be performed as noted.  +-----------+--------+-----+--------+----------+----------------------------+ RIGHT      PSV cm/sRatioStenosisWaveform  Comments                     +-----------+--------+-----+--------+----------+----------------------------+ CFA Prox   31                   triphasic                              +-----------+--------+-----+--------+----------+----------------------------+ CFA Mid    29                   triphasic                              +-----------+--------+-----+--------+----------+----------------------------+  CFA Distal 28                   triphasic                              +-----------+--------+-----+--------+----------+----------------------------+ DFA        25                   triphasic                              +-----------+--------+-----+--------+----------+----------------------------+ SFA Prox   49                   triphasic                              +-----------+--------+-----+--------+----------+----------------------------+ SFA Mid    46                   triphasic                              +-----------+--------+-----+--------+----------+----------------------------+ SFA Distal 29                   triphasic                              +-----------+--------+-----+--------+----------+----------------------------+ POP Prox   19                   triphasic                              +-----------+--------+-----+--------+----------+----------------------------+  POP Mid    8                    monophasicPre-occlusive                +-----------+--------+-----+--------+----------+----------------------------+ POP Distal              occluded          Appearance of acute thrombus +-----------+--------+-----+--------+----------+----------------------------+ TP Trunk                occluded                                       +-----------+--------+-----+--------+----------+----------------------------+ ATA Prox   14                   monophasic                             +-----------+--------+-----+--------+----------+----------------------------+ ATA Mid    13                   monophasic                             +-----------+--------+-----+--------+----------+----------------------------+ ATA Distal 11                   monophasic                             +-----------+--------+-----+--------+----------+----------------------------+ PTA Prox   15                   monophasicCollateral                    +-----------+--------+-----+--------+----------+----------------------------+ PTA Mid    23                   monophasic                             +-----------+--------+-----+--------+----------+----------------------------+ PTA Distal 23                   monophasic                             +-----------+--------+-----+--------+----------+----------------------------+ PERO Prox               occluded                                       +-----------+--------+-----+--------+----------+----------------------------+ PERO Mid   17                   monophasic                             +-----------+--------+-----+--------+----------+----------------------------+ PERO Distal12                   monophasic                             +-----------+--------+-----+--------+----------+----------------------------+ DP  14                   monophasic                             +-----------+--------+-----+--------+----------+----------------------------+   +-----------+--------+-----+--------+---------+------------------+ LEFT       PSV cm/sRatioStenosisWaveform Comments           +-----------+--------+-----+--------+---------+------------------+ CFA Prox   55                   triphasic                   +-----------+--------+-----+--------+---------+------------------+ CFA Mid    43                   triphasic                   +-----------+--------+-----+--------+---------+------------------+ CFA Distal 45                   triphasic                   +-----------+--------+-----+--------+---------+------------------+ DFA        37                   triphasic                   +-----------+--------+-----+--------+---------+------------------+ SFA Prox   45                   triphasic                   +-----------+--------+-----+--------+---------+------------------+ SFA Mid    46                   triphasic                    +-----------+--------+-----+--------+---------+------------------+ SFA Distal 32                   biphasic                    +-----------+--------+-----+--------+---------+------------------+ POP Prox   37                   biphasic                    +-----------+--------+-----+--------+---------+------------------+ POP Mid    36                   triphasic                   +-----------+--------+-----+--------+---------+------------------+ POP Distal 68                   triphasicIntimal thickening +-----------+--------+-----+--------+---------+------------------+ TP Trunk   49                   triphasic                   +-----------+--------+-----+--------+---------+------------------+ ATA Prox   85                   triphasic                   +-----------+--------+-----+--------+---------+------------------+ ATA Mid    73  triphasic                   +-----------+--------+-----+--------+---------+------------------+ ATA Distal 56                   triphasic                   +-----------+--------+-----+--------+---------+------------------+ PTA Prox   77                   triphasic                   +-----------+--------+-----+--------+---------+------------------+ PTA Mid    94                   triphasic                   +-----------+--------+-----+--------+---------+------------------+ PTA Distal 59                   triphasic                   +-----------+--------+-----+--------+---------+------------------+ PERO Prox  42                   triphasic                   +-----------+--------+-----+--------+---------+------------------+ PERO Mid   31                   biphasic                    +-----------+--------+-----+--------+---------+------------------+ PERO Distal29                   triphasic                   +-----------+--------+-----+--------+---------+------------------+  DP         47                   triphasic                   +-----------+--------+-----+--------+---------+------------------+  Summary: Right: Total occlusion noted in the mid popliteal artery and extending through the TP trunk. Appearance of acute thrombus noted. Reconstitution distally via collatels with monophasic flow observed in the PTA, DPA, and Pero A.  See table(s) above for measurements and observations. Electronically signed by Gerarda Fraction on 02/05/2023 at 8:50:17 AM.    Final    VAS [place] LOWER EXTREMITY VENOUS (DVT)  Result Date: 02/05/2023  Lower Venous DVT Study Patient Name:  ORIN KIEHL  Date of Exam:   02/04/2023 Medical Rec #: [id number]        Accession #:    [id number] Date of Birth: 12/13/1970       Patient Gender: M Patient Age:   15 years Exam Location:  [facility] Procedure:      VAS [place] LOWER EXTREMITY VENOUS (DVT) Referring Phys: Angelique Blonder WOLFE --------------------------------------------------------------------------------  Indications: Embolic stroke.  Comparison Study: No prior studies. Performing Technologist: Jean Rosenthal RDMS, RVT  Examination Guidelines: A complete evaluation includes B-mode imaging, spectral Doppler, color Doppler, and power Doppler as needed of all accessible portions of each vessel. Bilateral testing is considered an integral part of a complete examination. Limited examinations for reoccurring indications may be performed as noted. The reflux portion of the exam is performed with the patient in reverse Trendelenburg.  +---------+---------------+---------+-----------+----------+--------------+ RIGHT    CompressibilityPhasicitySpontaneityPropertiesThrombus Aging +---------+---------------+---------+-----------+----------+--------------+ CFV  Full           Yes      Yes                                 +---------+---------------+---------+-----------+----------+--------------+ SFJ      Full                                                         +---------+---------------+---------+-----------+----------+--------------+ FV Prox  Full                                                        +---------+---------------+---------+-----------+----------+--------------+ FV Mid   Full                                                        +---------+---------------+---------+-----------+----------+--------------+ FV DistalFull                                                        +---------+---------------+---------+-----------+----------+--------------+ PFV      Full                                                        +---------+---------------+---------+-----------+----------+--------------+ POP      Full           Yes      Yes                                 +---------+---------------+---------+-----------+----------+--------------+ PTV      Full                                                        +---------+---------------+---------+-----------+----------+--------------+ PERO     Full                                                        +---------+---------------+---------+-----------+----------+--------------+   +---------+---------------+---------+-----------+----------+--------------+ LEFT     CompressibilityPhasicitySpontaneityPropertiesThrombus Aging +---------+---------------+---------+-----------+----------+--------------+ CFV      Full           Yes      Yes                                 +---------+---------------+---------+-----------+----------+--------------+  SFJ      Full                                                        +---------+---------------+---------+-----------+----------+--------------+ FV Prox  Full                                                        +---------+---------------+---------+-----------+----------+--------------+ FV Mid   Full                                                         +---------+---------------+---------+-----------+----------+--------------+ FV DistalFull                                                        +---------+---------------+---------+-----------+----------+--------------+ PFV      Full                                                        +---------+---------------+---------+-----------+----------+--------------+ POP      Full           Yes      Yes                                 +---------+---------------+---------+-----------+----------+--------------+ PTV      Full                                                        +---------+---------------+---------+-----------+----------+--------------+ PERO     Full                                                        +---------+---------------+---------+-----------+----------+--------------+     Summary: RIGHT: - There is no evidence of deep vein thrombosis in the lower extremity.  - No cystic structure found in the popliteal fossa.  - Incidental right popliteal artery occlusion noted. Full arterial duplex subsequently performed.  LEFT: - There is no evidence of deep vein thrombosis in the lower extremity.  - No cystic structure found in the popliteal fossa.  *See table(s) above for measurements and observations. Electronically signed by Gerarda Fraction on 02/05/2023 at 8:49:57 AM.    Final    DG Chest Port 1 View  Result Date: 02/05/2023 CLINICAL DATA:  Acute respiratory failure EXAM: PORTABLE CHEST 1 VIEW  COMPARISON:  02/02/2023 FINDINGS: The patient's head obscures the right upper lung zone. Right lung base and left lung are clear. Endotracheal tube is seen 3.4 cm above the carina. Nasoenteric feeding tube extends into the gastric lumen. No definite pneumothorax. No pleural effusion. Aortic valve replacement has been performed. Thoracic aortic aneurysm again noted. Pulmonary vascularity is normal. No acute bone abnormality. IMPRESSION: 1. Endotracheal tube 3.4 cm above the  carina. 2. Nasoenteric feeding tube extends into the gastric lumen. 3. Grossly stable appearance of thoracic aortic aneurysm. Electronically Signed   By: Helyn Numbers M.D.   On: 02/05/2023 02:30   MR BRAIN WO CONTRAST  Result Date: 02/03/2023 CLINICAL DATA:  Altered mental status, nontraumatic EXAM: MRI HEAD WITHOUT CONTRAST TECHNIQUE: Multiplanar, multiecho pulse sequences of the brain and surrounding structures were obtained without intravenous contrast. COMPARISON:  No prior MRI available, correlation is made with CT head 02/02/2023 FINDINGS: Brain: Restricted diffusion with ADC correlate most focally right occipital lobe (series 5, images 72-78), as well as in the left inferior left cerebellum (series 5, images 60 and 61), consistent with acute infarcts. Additional foci of restricted diffusion with definite ADC correlates are seen in the bilateral caudate heads (series 5, image 77) and anterior right frontal lobe (series 5, image 96). Foci in the right frontal lobe adjacent to the remote infarct (series 5, images 90 and 91) may be related to susceptibility. The area in the right occipital lobe is associated with increased T2 hyperintense signal. No acute hemorrhage, mass, or mass effect. No extra-axial collection. Redemonstrated extensive encephalomalacia in the right MCA territory, with ex vacuo dilatation of the bilateral lateral ventricles. Approximately 7 mm of left-to-right midline shift, which appears similar to the 07/26/2022 CT head. Hemosiderin deposition is associated with the right MCA territory infarct, consistent with remote petechial hemorrhage. Vascular: Normal arterial flow voids. Skull and upper cervical spine: Normal marrow signal. Sinuses/Orbits: Clear paranasal sinuses. No acute finding in the orbits. Other: Trace fluid in right mastoid air cells. IMPRESSION: Acute infarcts in the right occipital lobe, left inferior cerebellum, bilateral caudate heads, and anterior right frontal lobe.  No evidence of acute hemorrhage. These results will be called to the ordering clinician or representative by the Radiologist Assistant, and communication documented in the PACS or [employer]. Electronically Signed   By: Wiliam Ke M.D.   On: 02/03/2023 20:04   ECHOCARDIOGRAM COMPLETE  Result Date: 02/02/2023    ECHOCARDIOGRAM REPORT   Patient Name:   NIKKOLAI Thrush Date of Exam: 02/02/2023 Medical Rec #:  [id number]       Height:       72.0 in Accession #:    [id number]      Weight:       143.5 lb Date of Birth:  12-04-1970      BSA:          1.850 m Patient Age:    51 years        BP:           172/82 mmHg Patient Gender: M               HR:           88 bpm. Exam Location:  Inpatient Procedure: 2D Echo, Color Doppler and Cardiac Doppler Indications:    Cardiac Arrest  History:        Patient has no prior history of Echocardiogram examinations.  Arrythmias:Cardiac Arrest. ETOH, Cocaine, and Polysubstance                 Abuse.  Sonographer:    Milbert Coulter Referring Phys: [id number] Aliene Beams  Sonographer Comments: No parasternal window, echo performed with patient supine and on artificial respirator and no subcostal window. [employer], contracted left arm. and Image acquisition challenging due to respiratory motion. IMPRESSIONS  1. Left ventricular ejection fraction, by estimation, is 40%. The left ventricle has mildly decreased function. Left ventricular endocardial border not optimally defined to fully evaluate regional wall motion. Septal hypokinesis worse that other global findings.There is mild concentric left ventricular hypertrophy. Left ventricular diastolic parameters are indeterminate.  2. Right ventricular systolic function is normal. The right ventricular size is normal.  3. The mitral valve is abnormal. Mild mitral valve regurgitation. No evidence of mitral stenosis.  4. The aortic valve has been repaired/replaced. Aortic valve regurgitation is not visualized. Aortic  valve mean gradient measures 13.0 mmHg.  5. Aortic root/ascending aorta has been repaired/replaced. Comparison(s): No prior Echocardiogram. Technically difficult study. FINDINGS  Left Ventricle: Left ventricular ejection fraction, by estimation, is 40%. The left ventricle has mildly decreased function. Left ventricular endocardial border not optimally defined to evaluate regional wall motion. The left ventricular internal cavity  size was normal in size. There is mild concentric left ventricular hypertrophy. Left ventricular diastolic parameters are indeterminate.  LV Wall Scoring: The anterior septum, mid inferoseptal segment, and basal inferoseptal segment are hypokinetic. Right Ventricle: The right ventricular size is normal. No increase in right ventricular wall thickness. Right ventricular systolic function is normal. Left Atrium: Left atrial size was normal in size. Right Atrium: Right atrial size was normal in size. Pericardium: There is no evidence of pericardial effusion. Mitral Valve: The mitral valve is abnormal. Mild mitral valve regurgitation. No evidence of mitral valve stenosis. Tricuspid Valve: The tricuspid valve is normal in structure. Tricuspid valve regurgitation is trivial. No evidence of tricuspid stenosis. Aortic Valve: The aortic valve has been repaired/replaced. Aortic valve regurgitation is not visualized. Aortic valve mean gradient measures 13.0 mmHg. Aortic valve peak gradient measures 24.6 mmHg. Aortic valve area, by VTI measures 1.21 cm. Pulmonic Valve: The pulmonic valve was not well visualized. Pulmonic valve regurgitation is not visualized. Aorta: The aortic root/ascending aorta has been repaired/replaced. IAS/Shunts: The interatrial septum was not well visualized.  LEFT VENTRICLE PLAX 2D LVIDd:         4.60 cm      Diastology LVIDs:         4.20 cm      LV e' medial:    7.51 cm/s LV PW:         1.60 cm      LV E/e' medial:  6.9 LV IVS:        1.60 cm      LV e' lateral:   10.40  cm/s LVOT diam:     1.80 cm      LV E/e' lateral: 5.0 LV SV:         55 LV SV Index:   30 LVOT Area:     2.54 cm  LV Volumes (MOD) LV vol d, MOD A2C: 98.9 ml LV vol d, MOD A4C: 101.0 ml LV vol s, MOD A2C: 74.0 ml LV vol s, MOD A4C: 45.9 ml LV SV MOD A2C:     24.9 ml LV SV MOD A4C:     101.0 ml LV SV MOD BP:  42.4 ml RIGHT VENTRICLE RV Basal diam:  3.00 cm RV Mid diam:    2.40 cm RV S prime:     7.18 cm/s TAPSE (M-mode): 1.6 cm LEFT ATRIUM             Index        RIGHT ATRIUM           Index LA diam:        3.20 cm 1.73 cm/m   RA Area:     16.00 cm LA Vol (A2C):   65.5 ml 35.40 ml/m  RA Volume:   37.80 ml  20.43 ml/m LA Vol (A4C):   52.9 ml 28.59 ml/m LA Biplane Vol: 57.8 ml 31.24 ml/m  AORTIC VALVE AV Area (Vmax):    1.29 cm AV Area (Vmean):   1.24 cm AV Area (VTI):     1.21 cm AV Vmax:           248.00 cm/s AV Vmean:          168.000 cm/s AV VTI:            0.454 m AV Peak Grad:      24.6 mmHg AV Mean Grad:      13.0 mmHg LVOT Vmax:         126.00 cm/s LVOT Vmean:        81.800 cm/s LVOT VTI:          0.215 m LVOT/AV VTI ratio: 0.47 MITRAL VALVE               TRICUSPID VALVE MV Area (PHT): 3.60 cm    TR Peak grad:   30.2 mmHg MV Decel Time: 211 msec    TR Vmax:        275.00 cm/s MV E velocity: 51.90 cm/s MV A velocity: 52.90 cm/s  SHUNTS MV E/A ratio:  0.98        Systemic VTI:  0.22 m                            Systemic Diam: 1.80 cm Riley Lam MD Electronically signed by Riley Lam MD Signature Date/Time: 02/02/2023/3:00:04 PM    Final    DG Abd Portable 1V  Result Date: 02/02/2023 CLINICAL DATA:  Encounter for feeding tube placement EXAM: PORTABLE ABDOMEN - 1 VIEW COMPARISON:  None Available. FINDINGS: Enteric tube courses below the diaphragm with the tip likely in the distal stomach near the pylorus. Nonobstructive visualized bowel gas pattern. Suspected bibasilar opacities. IMPRESSION: 1. Enteric tube courses below the diaphragm with the tip likely in the distal stomach  near the pylorus. 2. Suspected bibasilar opacities. Dedicated chest x-ray could further evaluate if clinically warranted. Electronically Signed   By: Feliberto Harts M.D.   On: 02/02/2023 13:46   DG CHEST PORT 1 VIEW  Result Date: 02/02/2023 CLINICAL DATA:  Endotracheal tube EXAM: PORTABLE CHEST 1 VIEW COMPARISON:  CXR 02/01/23, CTA Chest 02/01/23 FINDINGS: Endotracheal tube terminates approximately 5 cm above the carina. Enteric tube is slightly retracted with the tip positioned at the level of the GE junction and side hole above the GE junction. Esophageal temperature probe terminates in the upper esophagus. Status post median sternotomy. Unchanged cardiac and mediastinal contours. No pleural effusion. No pneumothorax. Redemonstrated are bibasilar airspace opacities, right-greater-than-left, which could represent atelectasis or infection. No radiographically apparent displaced rib fractures. Visualized upper abdomen is unremarkable IMPRESSION: 1. Endotracheal tube terminates approximately 5 cm above the  carina. 2. Enteric tube is slightly retracted with the tip positioned at the level of the GE junction and side hole above the GE junction. Recommend advancement. 3. Redemonstrated are bibasilar airspace opacities, right-greater-than-left, which could represent atelectasis or infection. Electronically Signed   By: Lorenza Cambridge M.D.   On: 02/02/2023 10:27   EEG adult  Result Date: 02/02/2023 Charlsie Quest, MD     02/02/2023  8:58 AM Patient Name: Daniel Arroyo MRN: [id number] Epilepsy Attending: Charlsie Quest Referring Physician/Provider: Gleason, Darcella Gasman, PA-C Date: 02/02/2023 Duration: 22.34 mins Patient history: 51yo M s/p V fib arrest. EEG to evaluate for seizure Level of alertness:  comatose AEDs during EEG study: Propofol Technical aspects: This EEG study was done with scalp electrodes positioned according to the 10-20 International system of electrode placement. Electrical activity was reviewed  with band pass filter of 1-70Hz , sensitivity of 7 uV/mm, display speed of 48mm/sec with a 60Hz  notched filter applied as appropriate. EEG data were recorded continuously and digitally stored.  Video monitoring was available and reviewed as appropriate. Description:  EEG showed continuous generalized low amplitude 2-3Hz  delta slowing admixed with overriding 15 to 18 Hz beta activity distributed symmetrically and diffusely. Hyperventilation and photic stimulation were not performed.   ABNORMALITY - Continuous slow, generalized - Excessive beta, generalized IMPRESSION: This study is suggestive of severe diffuse encephalopathy, nonspecific etiology. No seizures or epileptiform discharges were seen throughout the recording. Daniel Arroyo   CT HEAD WO CONTRAST ( )  Result Date: 02/02/2023 CLINICAL DATA:  52 year old male with altered mental status. History of chronic right ICA occlusion, right MCA infarction. EXAM: CT HEAD WITHOUT CONTRAST TECHNIQUE: Contiguous axial images were obtained from the base of the skull through the vertex without intravenous contrast. RADIATION DOSE REDUCTION: This exam was performed according to the departmental dose-optimization program which includes automated exposure control, adjustment of the mA and/or kV according to patient size and/or use of iterative reconstruction technique. COMPARISON:  Report of [facility] Head CT 05/19/2019 (no images available). [facility] Face CT 07/26/2022. FINDINGS: Brain: Series 4 axial images are the most diagnostic, least oblique. Chronic right hemisphere encephalomalacia corresponding to they MCA territory, and stable from visible brain parenchyma last year. Mild ex vacuo enlargement of the lateral ventricles. No intracranial mass effect. Maintained gray-white differentiation elsewhere. Streak artifact and/or laminar necrosis suspected in some areas of the right hemisphere. No convincing acute intracranial hemorrhage  identified. No cortically based acute infarct identified. Vascular: No suspicious intracranial vascular hyperdensity. Calcified atherosclerosis at the skull base. 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 No acute osseous abnormality identified. Sinuses/Orbits: Visualized paranasal sinuses and mastoids are stable and well aerated. Other: Intubated on the scout view. Partially visible oral enteric tube looping in the oropharynx on series 4, image 37. No acute orbit or scalp soft tissue finding is evident. IMPRESSION: 1. No acute intracranial abnormality identified. Chronic Right MCA territory infarct. 2. Intubated, with partially visible enteric tube looping in the oropharynx. Electronically Signed   By: Odessa Fleming M.D.   On: 02/02/2023 04:46    Microbiology Recent Results (from the past 240 hour(s))  MRSA Next Gen by PCR, Nasal     Status: None   Collection Time: 02/02/23  1:23 AM   Specimen: Nasal Mucosa; Nasal Swab  Result Value Ref Range Status   MRSA by PCR Next Gen NOT DETECTED NOT DETECTED Final    Comment: (  NOTE) The GeneXpert MRSA Assay (FDA approved for NASAL specimens only), is one component of a comprehensive MRSA colonization surveillance program. It is not intended to diagnose MRSA infection nor to guide or monitor treatment for MRSA infections. Test performance is not FDA approved in patients less than 55 years old. Performed at [facility], [address] [place], [place] [address]   Urine Culture     Status: Abnormal   Collection Time: 02/02/23  2:27 AM   Specimen: Urine, Catheterized  Result Value Ref Range Status   Specimen Description URINE, CATHETERIZED  Final   Special Requests   Final    NONE Reflexed from [id number] Performed at [facility], [address], [place], [place] [address]    Culture 60,000 COLONIES/mL ENTEROCOCCUS FAECALIS (A)  Final   Report Status 02/04/2023  FINAL  Final   Organism ID, Bacteria ENTEROCOCCUS FAECALIS (A)  Final      Susceptibility   Enterococcus faecalis - MIC*    AMPICILLIN <=2 SENSITIVE Sensitive     NITROFURANTOIN <=16 SENSITIVE Sensitive     VANCOMYCIN 1 SENSITIVE Sensitive     * 60,000 COLONIES/mL ENTEROCOCCUS FAECALIS  Culture, blood (Routine X 2) w Reflex to ID Panel     Status: None   Collection Time: 02/02/23  4:54 AM   Specimen: BLOOD RIGHT HAND  Result Value Ref Range Status   Specimen Description BLOOD RIGHT HAND  Final   Special Requests   Final    BOTTLES DRAWN AEROBIC AND ANAEROBIC Blood Culture adequate volume   Culture   Final    NO GROWTH 5 DAYS Performed at [facility], [address] [place], [place] [address]    Report Status 02/07/2023 FINAL  Final  Culture, blood (Routine X 2) w Reflex to ID Panel     Status: None   Collection Time: 02/02/23  4:54 AM   Specimen: BLOOD RIGHT HAND  Result Value Ref Range Status   Specimen Description BLOOD RIGHT HAND  Final   Special Requests   Final    BOTTLES DRAWN AEROBIC AND ANAEROBIC Blood Culture adequate volume   Culture   Final    NO GROWTH 5 DAYS Performed at [facility], [address], [place], [place] [address]    Report Status 02/07/2023 FINAL  Final  Culture, Respiratory w Gram Stain     Status: None   Collection Time: 02/02/23  9:30 AM   Specimen: Tracheal Aspirate; Respiratory  Result Value Ref Range Status   Specimen Description TRACHEAL ASPIRATE  Final   Special Requests NONE  Final   Gram Stain   Final    RARE WBC SEEN RARE SQUAMOUS EPITHELIAL CELLS PRESENT FEW GRAM POSITIVE COCCI FEW YEAST Performed at [facility], [address] [place], [place] [address]    Culture   Final    ABUNDANT STAPHYLOCOCCUS EPIDERMIDIS MODERATE CANDIDA DUBLINIENSIS    Report Status 02/05/2023 FINAL  Final   Organism ID, Bacteria STAPHYLOCOCCUS EPIDERMIDIS  Final      Susceptibility   Staphylococcus epidermidis - MIC*     CIPROFLOXACIN <=0.5 SENSITIVE Sensitive     ERYTHROMYCIN <=0.25 SENSITIVE Sensitive     GENTAMICIN <=0.5 SENSITIVE Sensitive     OXACILLIN >=4 RESISTANT Resistant     TETRACYCLINE >=16 RESISTANT Resistant     VANCOMYCIN 2 SENSITIVE Sensitive     TRIMETH/SULFA <=10 SENSITIVE Sensitive     CLINDAMYCIN >=8 RESISTANT Resistant  RIFAMPIN <=0.5 SENSITIVE Sensitive     Inducible Clindamycin NEGATIVE Sensitive     * ABUNDANT STAPHYLOCOCCUS EPIDERMIDIS    Lab Basic Metabolic Panel: Recent Labs  Lab 02/03/23 1919 02/04/23 0741 02/04/23 1812 02/05/23 0645 02/05/23 1603 02/06/23 0818 02/07/23 0026  NA  --  139 137 140 138 140 138  K  --  2.3* 3.1* 2.9* 3.5 3.5 3.5  CL  --  109 107 108 110 108 107  CO2  --  20* 20* 23 22 24 23   GLUCOSE  --  124* 179* 96 121* 100* 113*  BUN  --  11 11 8 8 9 10   CREATININE  --  0.70 0.75 0.61 0.58* 0.57* 0.48*  CALCIUM  --  7.3* 7.2* 7.4* 7.2* 7.6* 7.4*  MG 1.8 1.7 1.7 1.7  --   --   --   PHOS 1.9* 2.4* 3.0 2.0*  --   --   --    Liver Function Tests: No results for input(s): "AST", "ALT", "ALKPHOS", "BILITOT", "PROT", "ALBUMIN" in the last 168 hours. No results for input(s): "LIPASE", "AMYLASE" in the last 168 hours. No results for input(s): "AMMONIA" in the last 168 hours. CBC: Recent Labs  Lab 02/04/23 0741 02/05/23 0645 02/06/23 0818 02/07/23 0026  WBC 9.5 9.9 7.4 6.4  HGB 10.5* 10.5* 9.7* 9.2*  HCT 31.6* 32.4* 30.6* 29.2*  MCV 76.7* 78.3* 80.1 81.1  PLT 246 257 297 326   Cardiac Enzymes: No results for input(s): "CKTOTAL", "CKMB", "CKMBINDEX", "TROPONINI" in the last 168 hours. Sepsis Labs: Recent Labs  Lab 02/04/23 0741 02/05/23 0645 02/06/23 0818 02/07/23 0026  WBC 9.5 9.9 7.4 6.4

## 2023-02-19 DEATH — deceased

## 2023-02-21 ENCOUNTER — Ambulatory Visit: Payer: PPO | Admitting: Neurology

## 2023-03-08 ENCOUNTER — Ambulatory Visit (HOSPITAL_COMMUNITY): Payer: PPO | Admitting: Occupational Therapy
